# Patient Record
Sex: Female | Born: 1965 | Race: Black or African American | Hispanic: No | Smoking: Never smoker
Health system: Southern US, Community
[De-identification: ages and names within clinical notes are randomized; demographics above are authoritative.]

## PROBLEM LIST (undated history)

## (undated) DIAGNOSIS — N281 Cyst of kidney, acquired: Secondary | ICD-10-CM

## (undated) DIAGNOSIS — R42 Dizziness and giddiness: Secondary | ICD-10-CM

## (undated) DIAGNOSIS — T7840XA Allergy, unspecified, initial encounter: Secondary | ICD-10-CM

## (undated) DIAGNOSIS — I10 Essential (primary) hypertension: Secondary | ICD-10-CM

## (undated) DIAGNOSIS — D649 Anemia, unspecified: Secondary | ICD-10-CM

## (undated) DIAGNOSIS — K219 Gastro-esophageal reflux disease without esophagitis: Secondary | ICD-10-CM

## (undated) DIAGNOSIS — N19 Unspecified kidney failure: Secondary | ICD-10-CM

## (undated) DIAGNOSIS — E669 Obesity, unspecified: Secondary | ICD-10-CM

## (undated) DIAGNOSIS — A048 Other specified bacterial intestinal infections: Secondary | ICD-10-CM

## (undated) DIAGNOSIS — K635 Polyp of colon: Secondary | ICD-10-CM

## (undated) DIAGNOSIS — L723 Sebaceous cyst: Secondary | ICD-10-CM

## (undated) DIAGNOSIS — Q613 Polycystic kidney, unspecified: Secondary | ICD-10-CM

## (undated) DIAGNOSIS — D219 Benign neoplasm of connective and other soft tissue, unspecified: Secondary | ICD-10-CM

## (undated) DIAGNOSIS — F419 Anxiety disorder, unspecified: Secondary | ICD-10-CM

## (undated) DIAGNOSIS — G43909 Migraine, unspecified, not intractable, without status migrainosus: Secondary | ICD-10-CM

## (undated) HISTORY — DX: Anemia, unspecified: D64.9

## (undated) HISTORY — DX: Unspecified kidney failure: N19

## (undated) HISTORY — DX: Obesity, unspecified: E66.9

## (undated) HISTORY — DX: Anxiety disorder, unspecified: F41.9

## (undated) HISTORY — DX: Gastro-esophageal reflux disease without esophagitis: K21.9

## (undated) HISTORY — DX: Migraine, unspecified, not intractable, without status migrainosus: G43.909

## (undated) HISTORY — DX: Polyp of colon: K63.5

## (undated) HISTORY — DX: Sebaceous cyst: L72.3

## (undated) HISTORY — DX: Cyst of kidney, acquired: N28.1

## (undated) HISTORY — DX: Essential (primary) hypertension: I10

## (undated) HISTORY — DX: Benign neoplasm of connective and other soft tissue, unspecified: D21.9

## (undated) HISTORY — DX: Dizziness and giddiness: R42

## (undated) HISTORY — DX: Allergy, unspecified, initial encounter: T78.40XA

## (undated) HISTORY — DX: Polycystic kidney, unspecified: Q61.3

## (undated) HISTORY — PX: UTERINE FIBROID EMBOLIZATION: SHX825

## (undated) HISTORY — DX: Other specified bacterial intestinal infections: A04.8

---

## 1998-04-23 ENCOUNTER — Other Ambulatory Visit: Admission: RE | Admit: 1998-04-23 | Discharge: 1998-04-23 | Payer: Self-pay | Admitting: Gynecology

## 1999-02-17 ENCOUNTER — Other Ambulatory Visit: Admission: RE | Admit: 1999-02-17 | Discharge: 1999-02-17 | Payer: Self-pay | Admitting: Family Medicine

## 1999-10-06 ENCOUNTER — Encounter: Admission: RE | Admit: 1999-10-06 | Discharge: 2000-01-04 | Payer: Self-pay | Admitting: Internal Medicine

## 2000-01-06 ENCOUNTER — Other Ambulatory Visit: Admission: RE | Admit: 2000-01-06 | Discharge: 2000-01-06 | Payer: Self-pay | Admitting: *Deleted

## 2000-05-11 ENCOUNTER — Encounter: Payer: Self-pay | Admitting: Family Medicine

## 2000-05-11 ENCOUNTER — Ambulatory Visit (HOSPITAL_COMMUNITY): Admission: RE | Admit: 2000-05-11 | Discharge: 2000-05-11 | Payer: Self-pay | Admitting: Family Medicine

## 2001-12-29 ENCOUNTER — Encounter: Admission: RE | Admit: 2001-12-29 | Discharge: 2001-12-29 | Payer: Self-pay | Admitting: Family Medicine

## 2002-02-10 ENCOUNTER — Encounter: Admission: RE | Admit: 2002-02-10 | Discharge: 2002-02-10 | Payer: Self-pay | Admitting: Family Medicine

## 2002-02-15 ENCOUNTER — Encounter: Admission: RE | Admit: 2002-02-15 | Discharge: 2002-02-15 | Payer: Self-pay | Admitting: Family Medicine

## 2002-02-15 ENCOUNTER — Encounter: Payer: Self-pay | Admitting: Family Medicine

## 2002-03-17 ENCOUNTER — Encounter: Admission: RE | Admit: 2002-03-17 | Discharge: 2002-03-17 | Payer: Self-pay | Admitting: Family Medicine

## 2002-05-25 ENCOUNTER — Encounter: Admission: RE | Admit: 2002-05-25 | Discharge: 2002-05-25 | Payer: Self-pay | Admitting: Family Medicine

## 2002-12-28 ENCOUNTER — Encounter: Admission: RE | Admit: 2002-12-28 | Discharge: 2002-12-28 | Payer: Self-pay | Admitting: Family Medicine

## 2003-04-19 ENCOUNTER — Encounter: Admission: RE | Admit: 2003-04-19 | Discharge: 2003-04-19 | Payer: Self-pay | Admitting: Family Medicine

## 2003-04-23 ENCOUNTER — Encounter: Admission: RE | Admit: 2003-04-23 | Discharge: 2003-04-23 | Payer: Self-pay | Admitting: Family Medicine

## 2003-04-27 ENCOUNTER — Encounter: Admission: RE | Admit: 2003-04-27 | Discharge: 2003-04-27 | Payer: Self-pay | Admitting: Family Medicine

## 2003-07-26 ENCOUNTER — Encounter: Admission: RE | Admit: 2003-07-26 | Discharge: 2003-07-26 | Payer: Self-pay | Admitting: Family Medicine

## 2003-07-31 ENCOUNTER — Encounter: Admission: RE | Admit: 2003-07-31 | Discharge: 2003-07-31 | Payer: Self-pay | Admitting: Family Medicine

## 2003-08-28 ENCOUNTER — Encounter: Admission: RE | Admit: 2003-08-28 | Discharge: 2003-08-28 | Payer: Self-pay | Admitting: Sports Medicine

## 2004-06-17 ENCOUNTER — Encounter: Admission: RE | Admit: 2004-06-17 | Discharge: 2004-06-17 | Payer: Self-pay | Admitting: Internal Medicine

## 2004-08-07 ENCOUNTER — Encounter (INDEPENDENT_AMBULATORY_CARE_PROVIDER_SITE_OTHER): Payer: Self-pay | Admitting: *Deleted

## 2004-08-08 ENCOUNTER — Encounter: Payer: Self-pay | Admitting: Internal Medicine

## 2005-10-23 ENCOUNTER — Ambulatory Visit: Payer: Self-pay

## 2005-11-13 ENCOUNTER — Ambulatory Visit: Payer: Self-pay | Admitting: Family Medicine

## 2005-11-18 ENCOUNTER — Encounter (INDEPENDENT_AMBULATORY_CARE_PROVIDER_SITE_OTHER): Payer: Self-pay | Admitting: *Deleted

## 2005-11-20 ENCOUNTER — Encounter: Admission: RE | Admit: 2005-11-20 | Discharge: 2005-11-20 | Payer: Self-pay | Admitting: Sports Medicine

## 2005-12-14 HISTORY — PX: MYOMECTOMY: SHX85

## 2006-02-19 ENCOUNTER — Ambulatory Visit: Payer: Self-pay | Admitting: Family Medicine

## 2006-06-14 ENCOUNTER — Ambulatory Visit: Payer: Self-pay | Admitting: Family Medicine

## 2006-11-03 ENCOUNTER — Encounter: Admission: RE | Admit: 2006-11-03 | Discharge: 2006-11-03 | Payer: Self-pay | Admitting: Obstetrics and Gynecology

## 2006-11-06 ENCOUNTER — Encounter: Admission: RE | Admit: 2006-11-06 | Discharge: 2006-11-06 | Payer: Self-pay | Admitting: Interventional Radiology

## 2006-12-01 ENCOUNTER — Inpatient Hospital Stay (HOSPITAL_COMMUNITY): Admission: RE | Admit: 2006-12-01 | Discharge: 2006-12-03 | Payer: Self-pay | Admitting: Obstetrics and Gynecology

## 2006-12-01 ENCOUNTER — Encounter (INDEPENDENT_AMBULATORY_CARE_PROVIDER_SITE_OTHER): Payer: Self-pay | Admitting: *Deleted

## 2007-01-28 ENCOUNTER — Ambulatory Visit (HOSPITAL_BASED_OUTPATIENT_CLINIC_OR_DEPARTMENT_OTHER): Admission: RE | Admit: 2007-01-28 | Discharge: 2007-01-28 | Payer: Self-pay | Admitting: Cardiology

## 2007-02-10 DIAGNOSIS — I10 Essential (primary) hypertension: Secondary | ICD-10-CM

## 2007-02-10 DIAGNOSIS — N925 Other specified irregular menstruation: Secondary | ICD-10-CM | POA: Insufficient documentation

## 2007-02-10 DIAGNOSIS — N6029 Fibroadenosis of unspecified breast: Secondary | ICD-10-CM | POA: Insufficient documentation

## 2007-02-10 DIAGNOSIS — E669 Obesity, unspecified: Secondary | ICD-10-CM

## 2007-02-10 DIAGNOSIS — N949 Unspecified condition associated with female genital organs and menstrual cycle: Secondary | ICD-10-CM

## 2007-02-11 ENCOUNTER — Encounter (INDEPENDENT_AMBULATORY_CARE_PROVIDER_SITE_OTHER): Payer: Self-pay | Admitting: *Deleted

## 2007-03-29 ENCOUNTER — Encounter (HOSPITAL_COMMUNITY): Admission: RE | Admit: 2007-03-29 | Discharge: 2007-03-29 | Payer: Self-pay | Admitting: Cardiology

## 2007-10-05 ENCOUNTER — Encounter: Admission: RE | Admit: 2007-10-05 | Discharge: 2007-10-05 | Payer: Self-pay | Admitting: Cardiology

## 2007-10-05 ENCOUNTER — Encounter (INDEPENDENT_AMBULATORY_CARE_PROVIDER_SITE_OTHER): Payer: Self-pay | Admitting: *Deleted

## 2007-12-15 HISTORY — PX: LOBECTOMY: SHX5089

## 2008-01-14 ENCOUNTER — Encounter (INDEPENDENT_AMBULATORY_CARE_PROVIDER_SITE_OTHER): Payer: Self-pay | Admitting: *Deleted

## 2008-01-14 ENCOUNTER — Encounter: Admission: RE | Admit: 2008-01-14 | Discharge: 2008-01-14 | Payer: Self-pay | Admitting: Cardiology

## 2008-01-30 ENCOUNTER — Encounter: Admission: RE | Admit: 2008-01-30 | Discharge: 2008-01-30 | Payer: Self-pay | Admitting: Cardiology

## 2008-02-01 ENCOUNTER — Ambulatory Visit: Payer: Self-pay | Admitting: Thoracic Surgery

## 2008-02-06 ENCOUNTER — Ambulatory Visit (HOSPITAL_COMMUNITY): Admission: RE | Admit: 2008-02-06 | Discharge: 2008-02-06 | Payer: Self-pay | Admitting: Thoracic Surgery

## 2008-02-07 ENCOUNTER — Ambulatory Visit: Payer: Self-pay | Admitting: Thoracic Surgery

## 2008-03-16 ENCOUNTER — Inpatient Hospital Stay (HOSPITAL_COMMUNITY): Admission: RE | Admit: 2008-03-16 | Discharge: 2008-03-20 | Payer: Self-pay | Admitting: Thoracic Surgery

## 2008-03-16 ENCOUNTER — Encounter: Payer: Self-pay | Admitting: Thoracic Surgery

## 2008-03-20 ENCOUNTER — Ambulatory Visit: Payer: Self-pay | Admitting: Thoracic Surgery

## 2008-03-29 ENCOUNTER — Encounter: Admission: RE | Admit: 2008-03-29 | Discharge: 2008-03-29 | Payer: Self-pay | Admitting: Thoracic Surgery

## 2008-03-29 ENCOUNTER — Ambulatory Visit: Payer: Self-pay | Admitting: Thoracic Surgery

## 2008-04-20 ENCOUNTER — Ambulatory Visit: Payer: Self-pay | Admitting: Family Medicine

## 2008-04-20 DIAGNOSIS — F411 Generalized anxiety disorder: Secondary | ICD-10-CM | POA: Insufficient documentation

## 2008-04-20 DIAGNOSIS — F419 Anxiety disorder, unspecified: Secondary | ICD-10-CM | POA: Insufficient documentation

## 2008-04-20 DIAGNOSIS — I10 Essential (primary) hypertension: Secondary | ICD-10-CM | POA: Insufficient documentation

## 2008-04-25 ENCOUNTER — Encounter: Admission: RE | Admit: 2008-04-25 | Discharge: 2008-04-25 | Payer: Self-pay | Admitting: Thoracic Surgery

## 2008-04-25 ENCOUNTER — Ambulatory Visit: Payer: Self-pay | Admitting: Thoracic Surgery

## 2008-04-26 ENCOUNTER — Ambulatory Visit: Payer: Self-pay | Admitting: Licensed Clinical Social Worker

## 2008-05-11 ENCOUNTER — Ambulatory Visit: Payer: Self-pay | Admitting: Licensed Clinical Social Worker

## 2008-06-06 ENCOUNTER — Encounter: Admission: RE | Admit: 2008-06-06 | Discharge: 2008-06-06 | Payer: Self-pay | Admitting: Thoracic Surgery

## 2008-06-06 ENCOUNTER — Ambulatory Visit: Payer: Self-pay | Admitting: Thoracic Surgery

## 2008-11-24 ENCOUNTER — Telehealth: Payer: Self-pay | Admitting: Family Medicine

## 2008-11-26 ENCOUNTER — Ambulatory Visit: Payer: Self-pay | Admitting: Family Medicine

## 2008-11-26 LAB — CONVERTED CEMR LAB
ALT: 16 units/L (ref 0–35)
Basophils Absolute: 0.1 10*3/uL (ref 0.0–0.1)
Bilirubin, Direct: 0.1 mg/dL (ref 0.0–0.3)
CO2: 30 meq/L (ref 19–32)
Calcium: 8.7 mg/dL (ref 8.4–10.5)
Chloride: 105 meq/L (ref 96–112)
Cholesterol: 209 mg/dL (ref 0–200)
FSH: 4.1 milliintl units/mL
HDL: 41.8 mg/dL (ref >39.0)
Lymphocytes Relative: 44.9 % (ref 12.0–46.0)
MCHC: 34.2 g/dL (ref 30.0–36.0)
Neutrophils Relative %: 45.2 % (ref 43.0–77.0)
Platelets: 244 10*3/uL (ref 150–400)
Potassium: 3.5 meq/L (ref 3.5–5.1)
RDW: 13.3 % (ref 11.5–14.6)
Sodium: 141 meq/L (ref 135–145)
Specific Gravity, Urine: 1.02
Total Bilirubin: 0.7 mg/dL (ref 0.3–1.2)
Urobilinogen, UA: 0.2
VLDL: 13 mg/dL (ref 0–40)
WBC Urine, dipstick: NEGATIVE

## 2009-01-07 ENCOUNTER — Telehealth: Payer: Self-pay | Admitting: *Deleted

## 2009-03-04 ENCOUNTER — Ambulatory Visit: Payer: Self-pay | Admitting: Thoracic Surgery

## 2009-08-23 ENCOUNTER — Encounter: Payer: Self-pay | Admitting: Internal Medicine

## 2009-08-27 DIAGNOSIS — R109 Unspecified abdominal pain: Secondary | ICD-10-CM | POA: Insufficient documentation

## 2009-08-27 DIAGNOSIS — K222 Esophageal obstruction: Secondary | ICD-10-CM | POA: Insufficient documentation

## 2009-08-27 DIAGNOSIS — K219 Gastro-esophageal reflux disease without esophagitis: Secondary | ICD-10-CM | POA: Insufficient documentation

## 2009-08-28 ENCOUNTER — Ambulatory Visit: Payer: Self-pay | Admitting: Internal Medicine

## 2009-09-17 ENCOUNTER — Ambulatory Visit: Payer: Self-pay | Admitting: Internal Medicine

## 2009-09-17 ENCOUNTER — Encounter: Payer: Self-pay | Admitting: Internal Medicine

## 2009-10-02 ENCOUNTER — Encounter: Payer: Self-pay | Admitting: Internal Medicine

## 2009-10-04 ENCOUNTER — Telehealth: Payer: Self-pay | Admitting: Internal Medicine

## 2009-10-07 ENCOUNTER — Telehealth: Payer: Self-pay | Admitting: Internal Medicine

## 2009-10-09 ENCOUNTER — Ambulatory Visit: Payer: Self-pay | Admitting: Internal Medicine

## 2009-10-09 DIAGNOSIS — L980 Pyogenic granuloma: Secondary | ICD-10-CM | POA: Insufficient documentation

## 2009-10-09 DIAGNOSIS — K589 Irritable bowel syndrome without diarrhea: Secondary | ICD-10-CM | POA: Insufficient documentation

## 2009-10-18 ENCOUNTER — Encounter: Payer: Self-pay | Admitting: Internal Medicine

## 2009-10-18 ENCOUNTER — Ambulatory Visit: Payer: Self-pay | Admitting: Internal Medicine

## 2009-10-21 ENCOUNTER — Encounter: Payer: Self-pay | Admitting: Internal Medicine

## 2009-11-01 ENCOUNTER — Ambulatory Visit: Payer: Self-pay | Admitting: Pulmonary Disease

## 2009-11-01 DIAGNOSIS — R05 Cough: Secondary | ICD-10-CM

## 2009-11-01 DIAGNOSIS — R059 Cough, unspecified: Secondary | ICD-10-CM | POA: Insufficient documentation

## 2009-11-01 LAB — CONVERTED CEMR LAB: Angiotensin 1 Converting Enzyme: 43 units/L (ref 9–67)

## 2009-11-04 ENCOUNTER — Ambulatory Visit: Payer: Self-pay | Admitting: Internal Medicine

## 2009-11-05 ENCOUNTER — Ambulatory Visit: Payer: Self-pay | Admitting: Pulmonary Disease

## 2009-11-08 LAB — CONVERTED CEMR LAB
ALT: 16 units/L (ref 0–35)
AST: 17 units/L (ref 0–37)
Albumin: 3.8 g/dL (ref 3.5–5.2)
Basophils Relative: 0.5 % (ref 0.0–3.0)
Eosinophils Relative: 4.4 % (ref 0.0–5.0)
GFR calc non Af Amer: 87.02 mL/min (ref >60)
Glucose, Bld: 79 mg/dL (ref 70–99)
HCT: 32.6 % — ABNORMAL LOW (ref 36.0–46.0)
Hemoglobin: 11.2 g/dL — ABNORMAL LOW (ref 12.0–15.0)
Lymphs Abs: 2.2 10*3/uL (ref 0.7–4.0)
Monocytes Relative: 4.3 % (ref 3.0–12.0)
Neutro Abs: 2.7 10*3/uL (ref 1.4–7.7)
Potassium: 3.9 meq/L (ref 3.5–5.1)
RDW: 13.2 % (ref 11.5–14.6)
Sodium: 140 meq/L (ref 135–145)
Total Protein: 8 g/dL (ref 6.0–8.3)
WBC: 5.3 10*3/uL (ref 4.5–10.5)

## 2009-11-11 ENCOUNTER — Telehealth: Payer: Self-pay | Admitting: Pulmonary Disease

## 2009-11-11 ENCOUNTER — Encounter: Payer: Self-pay | Admitting: Pulmonary Disease

## 2009-11-14 ENCOUNTER — Telehealth: Payer: Self-pay | Admitting: Internal Medicine

## 2009-11-15 ENCOUNTER — Telehealth: Payer: Self-pay | Admitting: Internal Medicine

## 2009-12-17 ENCOUNTER — Telehealth (INDEPENDENT_AMBULATORY_CARE_PROVIDER_SITE_OTHER): Payer: Self-pay | Admitting: *Deleted

## 2010-03-24 ENCOUNTER — Ambulatory Visit: Payer: Self-pay | Admitting: Family Medicine

## 2010-03-24 DIAGNOSIS — Q638 Other specified congenital malformations of kidney: Secondary | ICD-10-CM | POA: Insufficient documentation

## 2010-03-24 DIAGNOSIS — N390 Urinary tract infection, site not specified: Secondary | ICD-10-CM | POA: Insufficient documentation

## 2010-03-24 DIAGNOSIS — N200 Calculus of kidney: Secondary | ICD-10-CM | POA: Insufficient documentation

## 2010-03-24 LAB — CONVERTED CEMR LAB
Nitrite: POSITIVE
WBC Urine, dipstick: NEGATIVE
pH: 6

## 2010-04-30 ENCOUNTER — Ambulatory Visit: Payer: Self-pay | Admitting: Family Medicine

## 2010-05-01 ENCOUNTER — Telehealth (INDEPENDENT_AMBULATORY_CARE_PROVIDER_SITE_OTHER): Payer: Self-pay | Admitting: *Deleted

## 2010-05-19 ENCOUNTER — Encounter: Payer: Self-pay | Admitting: Family Medicine

## 2010-09-23 ENCOUNTER — Telehealth (INDEPENDENT_AMBULATORY_CARE_PROVIDER_SITE_OTHER): Payer: Self-pay | Admitting: *Deleted

## 2010-12-18 ENCOUNTER — Ambulatory Visit
Admission: RE | Admit: 2010-12-18 | Discharge: 2010-12-18 | Payer: Self-pay | Source: Home / Self Care | Attending: Family Medicine | Admitting: Family Medicine

## 2010-12-18 ENCOUNTER — Other Ambulatory Visit: Payer: Self-pay | Admitting: Family Medicine

## 2010-12-18 ENCOUNTER — Encounter: Payer: Self-pay | Admitting: Family Medicine

## 2010-12-18 DIAGNOSIS — E559 Vitamin D deficiency, unspecified: Secondary | ICD-10-CM | POA: Insufficient documentation

## 2010-12-18 DIAGNOSIS — R079 Chest pain, unspecified: Secondary | ICD-10-CM | POA: Insufficient documentation

## 2010-12-18 DIAGNOSIS — E785 Hyperlipidemia, unspecified: Secondary | ICD-10-CM | POA: Insufficient documentation

## 2010-12-19 LAB — BASIC METABOLIC PANEL
BUN: 13 mg/dL (ref 6–23)
CO2: 28 mEq/L (ref 19–32)
Calcium: 8.6 mg/dL (ref 8.4–10.5)
Chloride: 103 mEq/L (ref 96–112)
Creatinine, Ser: 1 mg/dL (ref 0.4–1.2)
GFR: 76.67 mL/min (ref >60.00)
Glucose, Bld: 70 mg/dL (ref 70–99)
Potassium: 3.8 mEq/L (ref 3.5–5.1)
Sodium: 139 mEq/L (ref 135–145)

## 2010-12-19 LAB — CONVERTED CEMR LAB: Vit D, 25-Hydroxy: 31 ng/mL (ref 30–89)

## 2010-12-22 ENCOUNTER — Telehealth (INDEPENDENT_AMBULATORY_CARE_PROVIDER_SITE_OTHER): Payer: Self-pay | Admitting: *Deleted

## 2010-12-22 ENCOUNTER — Ambulatory Visit: Admit: 2010-12-22 | Payer: Self-pay | Admitting: Internal Medicine

## 2011-01-04 ENCOUNTER — Encounter: Payer: Self-pay | Admitting: Cardiology

## 2011-01-04 ENCOUNTER — Encounter: Payer: Self-pay | Admitting: Internal Medicine

## 2011-01-11 LAB — CONVERTED CEMR LAB
ALT: 18 units/L (ref 0–35)
Albumin: 3.8 g/dL (ref 3.5–5.2)
BUN: 13 mg/dL (ref 6–23)
Chloride: 106 meq/L (ref 96–112)
Cholesterol: 199 mg/dL (ref 0–200)
Eosinophils Relative: 7.4 % — ABNORMAL HIGH (ref 0.0–5.0)
Glucose, Bld: 84 mg/dL (ref 70–99)
HCT: 33.2 % — ABNORMAL LOW (ref 36.0–46.0)
Hemoglobin: 11.3 g/dL — ABNORMAL LOW (ref 12.0–15.0)
Lymphs Abs: 1.5 10*3/uL (ref 0.7–4.0)
MCV: 86.1 fL (ref 78.0–100.0)
Monocytes Absolute: 0.2 10*3/uL (ref 0.1–1.0)
Neutro Abs: 2.4 10*3/uL (ref 1.4–7.7)
Platelets: 266 10*3/uL (ref 150.0–400.0)
Potassium: 4.2 meq/L (ref 3.5–5.1)
RDW: 15.2 % — ABNORMAL HIGH (ref 11.5–14.6)
TSH: 2.13 microintl units/mL (ref 0.35–5.50)
Total Bilirubin: 0.3 mg/dL (ref 0.3–1.2)
Total Protein: 7.8 g/dL (ref 6.0–8.3)
Vit D, 25-Hydroxy: 27 ng/mL — ABNORMAL LOW (ref 30–89)
WBC: 4.5 10*3/uL (ref 4.5–10.5)

## 2011-01-15 NOTE — Progress Notes (Signed)
Summary: lab-lmomx2  Phone Note Outgoing Call   Call placed by: Doristine Devoid,  May 01, 2010 1:57 PM Call placed to: Patient Summary of Call: level slightly low- start 50,000 units Vit D x8 weeks and recheck level  Follow-up for Phone Call        left message on machine ..........Marland KitchenDoristine Devoid  May 01, 2010 1:57 PM   left message on machine ........Marland KitchenDoristine Devoid  May 14, 2010 9:35 AM   mailed labs and instructions....Marland KitchenMarland KitchenDoristine Devoid  May 20, 2010 1:16 PM     New/Updated Medications: VITAMIN D (ERGOCALCIFEROL) 50000 UNIT CAPS (ERGOCALCIFEROL) take one tablet weekly x8 weeks Prescriptions: VITAMIN D (ERGOCALCIFEROL) 50000 UNIT CAPS (ERGOCALCIFEROL) take one tablet weekly x8 weeks  #8 x 0   Entered by:   Doristine Devoid   Authorized by:   Neena Rhymes MD   Signed by:   Doristine Devoid on 05/20/2010   Method used:   Electronically to        [facility]  [address] [facility]* (retail)       [address]       [place], [place]  [address]       Ph: [phone] or [phone]       Fax: [phone]   RxID:   [id number]

## 2011-01-15 NOTE — Consult Note (Signed)
Summary: [facility]  [facility]   Imported By: Lanelle Bal 05/26/2010 13:49:07  _____________________________________________________________________  External Attachment:    Type:   Image     Comment:   External Document

## 2011-01-15 NOTE — Assessment & Plan Note (Signed)
Summary: FOLLOWUP/KN   Vital Signs:  Patient profile:   45 year old female Weight:      225 pounds BMI:     35.90 Pulse rate:   94 / minute BP sitting:   134 / 90  (left arm)  Vitals Entered By: Doristine Devoid CMA (December 18, 2010 2:07 PM) CC: bp f/u    History of Present Illness: 45 yo woman here today for  1) HTN- stopped HCTZ a few weeks ago.  will sometimes feel her heart beat- hasn't correlated this to any particular emotion or time of day.  says that when she feels the heart beat she feels 'a pressure' in her chest.  reports intermittant tension HAs, occasional 'light headed feeling' and SOB- mostly w/ exertion.  some edema of ankles- usually improves w/ elevation.  2) Vit D deficiency- never had f/u level done after completing 8 week course of meds.  3) Hyperlipidemia- was to work on diet and exercise for 6 months and then recheck labs.  never did.  has gained 21 lbs.    Current Medications (verified): 1)  Docusate Sodium 100 Mg Caps (Docusate Sodium) .... Take One Tablet Two Times A Day 2)  Calcium 600 Mg Tabs (Calcium) .... 1200mg  Daily 3)  Vitamin C 1000 Mg Tabs (Ascorbic Acid) .Marland Kitchen.. 1 By Mouth Daily 4)  Magnesium Drink .... Daily 5)  Proair Hfa 108 (90 Base) Mcg/act Aers (Albuterol Sulfate) .... Two Puffs Up To Four Times Per Day As Needed 6)  Omeprazole 40 Mg Cpdr (Omeprazole) .Marland Kitchen.. 1 Tab By Mouth Daily, May Increase To Two Times A Day As Needed 7)  Nasonex 50 Mcg/act Susp (Mometasone Furoate) .... 2 Sprays Each Nostril Once Daily 8)  Hydrochlorothiazide 12.5 Mg  Tabs (Hydrochlorothiazide) .... Take 1 Tab  By Mouth Every Morning 9)  Vitamin D 1000 Unit Tabs (Cholecalciferol) .Marland Kitchen.. 1 Tab Daily 10)  Vitamin B-12 100 Mcg Tabs (Cyanocobalamin) .Marland Kitchen.. 1 Tab Daily 11)  Magnesium 500 Mg Tabs (Magnesium) .Marland Kitchen.. 1 Tab Daily 12)  Multivitamins  Tabs (Multiple Vitamin) .Marland Kitchen.. 1 Daily  Allergies (verified): 1)  ! Biaxin  Past History:  Past medical, surgical, family and social  histories (including risk factors) reviewed for relevance to current acute and chronic problems.  Past Medical History: Reviewed history from 11/01/2009 and no changes required. 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 TV US- multiple fibroids, largest 6 cm - 12/03/2005, u/s breast - benign - 02/23/2002 myomectomy 2007 Right VATS 2009 Dr. Edwyna Shell  Family History: Reviewed history from 03/24/2010 and no changes required. mother died age 61 cva, htn, cad, dm;  father died in 79s uncertain; has 5 brothers and four sisters with many having htn, cad and dm No FH of Colon Cancer: Family History of Diabetes: Mom, Sister, Brother Family History of Heart Disease: Sister, Uncle Family History of Kidney Disease: Sister, Mother (stones)  Social History: Reviewed history from 10/09/2009 and no changes required. single; has 41 year old son; unemployed; lives with boyfriend of three years; likes tv, internet, movies; no tobacco no drugs [employer], political party Married Never Smoked Alcohol use-no Drug use-no Regular exercise-no  Review of Systems      See HPI  Physical Exam  General:  Well-developed,well-nourished,in no acute distress; alert,appropriate  and cooperative throughout examination.  overwt Head:  Normocephalic and atraumatic without obvious abnormalities. No apparent alopecia or balding. Eyes:  PERRL, EOMI Neck:  No deformities, masses, or tenderness noted. Lungs:  Normal respiratory effort, chest expands symmetrically. Lungs are clear to auscultation, no crackles or wheezes. Heart:  Normal rate and regular rhythm. S1 and S2 normal without gallop, murmur, click, rub or other extra sounds. Abdomen:   Bowel sounds positive,abdomen soft and non-tender without masses, organomegaly or hernias noted. Pulses:  +2 carotid, radial, DP Extremities:  No clubbing, cyanosis, edema, or deformity noted  Neurologic:  alert & oriented X3, cranial nerves II-XII intact, and gait normal.   Cervical Nodes:  No lymphadenopathy noted Psych:  anxious appearing   Impression & Recommendations:  Problem # 1:  HYPERTENSION (ICD-401.9) Assessment Unchanged given systolic BP of 90 and pt's reported intermittant edema advised her to restart HCTZ.  check labs. The following medications were removed from the medication list:    Hydrochlorothiazide 25 Mg Tabs (Hydrochlorothiazide) .Marland Kitchen... Take 1/4 tablet every other day Her updated medication list for this problem includes:    Hydrochlorothiazide 12.5 Mg Tabs (Hydrochlorothiazide) .Marland Kitchen... Take 1 tab  by mouth every morning  Orders: Specimen Handling ([address]) TLB-BMP (Basic Metabolic Panel-BMET) (80048-METABOL)  Problem # 2:  VITAMIN D DEFICIENCY (ICD-268.9) Assessment: New recheck labs to ensure adequate repletion. Orders: Venipuncture (98119) T-Vitamin D (25-Hydroxy) (14782-95621) Specimen Handling (308[address])  Problem # 3:  CHEST PAIN (ICD-786.50) Assessment: New given pt's reported chest pressure, SOB w/ exertion, likely elevated cholesterol attempted to do EKG but pt declined.  prefers to see cards for this.  as pt is asymptomatic today will put in referral but reviewed red flags that should prompt immediate attention.  Pt expresses understanding and is in agreement w/ this plan. Orders: Cardiology Referral (Cardiology)  Problem # 4:  HYPERLIPIDEMIA (ICD-272.4) Assessment: New pt did not f/u for repeat labs nor work on diet or exercise as she was supposed to.  pt not fasting today so unable to get accurate labs.  pt will return for these.  Complete Medication List: 1)  Docusate Sodium 100 Mg Caps (Docusate sodium) .... Take one tablet two times a day 2)   Calcium 600 Mg Tabs (Calcium) .... 1200mg  daily 3)  Vitamin C 1000 Mg Tabs (Ascorbic acid) .Marland Kitchen.. 1 by mouth daily 4)  Magnesium Drink  .... Daily 5)  Proair Hfa 108 (90 Base) Mcg/act Aers (Albuterol sulfate) .... Two puffs up to four times per day as needed 6)  Omeprazole 40 Mg Cpdr (Omeprazole) .Marland Kitchen.. 1 tab by mouth daily, may increase to two times a day as needed 7)  Nasonex 50 Mcg/act Susp (Mometasone furoate) .... 2 sprays each nostril once daily 8)  Hydrochlorothiazide 12.5 Mg Tabs (Hydrochlorothiazide) .... Take 1 tab  by mouth every morning 9)  Vitamin D 1000 Unit Tabs (Cholecalciferol) .Marland Kitchen.. 1 tab daily 10)  Vitamin B-12 100 Mcg Tabs (Cyanocobalamin) .Marland Kitchen.. 1 tab daily 11)  Magnesium 500 Mg Tabs (Magnesium) .Marland Kitchen.. 1 tab daily 12)  Multivitamins Tabs (Multiple vitamin) .Marland Kitchen.. 1 daily  Patient Instructions: 1)  Please schedule a follow-up appointment in 1 month to recheck blood pressure and potassium. 2)  Someone will call you with your cardiology appt 3)  Take the hydrochlorothiazide daily 4)  Call with any questions or concerns 5)  Hang in there!  Prescriptions: MULTIVITAMINS  TABS (MULTIPLE VITAMIN) 1 daily  #1 bottle x 3   Entered and Authorized by:   Neena Rhymes MD  Signed by:   Neena Rhymes MD on 12/18/2010   Method used:   Electronically to        [facility]* (retail)       [address]       [place], [place]  [address]       Ph: [phone] or [phone]       Fax: [phone]   RxID:   [id number] MAGNESIUM 500 MG TABS (MAGNESIUM) 1 tab daily  #30 x 3   Entered and Authorized by:   Neena Rhymes MD   Signed by:   Neena Rhymes MD on 12/18/2010   Method used:   Electronically to        [facility]  [address] [facility]* (retail)       [address]       [place], [place]  [address]       Ph: [phone] or [phone]       Fax: [phone]   RxID:   [id number] VITAMIN B-12 100 MCG TABS  (CYANOCOBALAMIN) 1 tab daily  #30 x 3   Entered and Authorized by:   Neena Rhymes MD   Signed by:   Neena Rhymes MD on 12/18/2010   Method used:   Electronically to        [facility]  [address] [facility]* (retail)       [address]       [place], [place]  [address]       Ph: [phone] or [phone]       Fax: [phone]   RxID:   [id number] VITAMIN D 1000 UNIT TABS (CHOLECALCIFEROL) 1 tab daily  #30 x 3   Entered and Authorized by:   Neena Rhymes MD   Signed by:   Neena Rhymes MD on 12/18/2010   Method used:   Electronically to        [facility]  [address] [facility]* (retail)       [address]       [place], [place]  [address]       Ph: [phone] or [phone]       Fax: [phone]   RxID:   [id number] HYDROCHLOROTHIAZIDE 12.5 MG  TABS (HYDROCHLOROTHIAZIDE) Take 1 tab  by mouth every morning  #30 x 3   Entered and Authorized by:   Neena Rhymes MD   Signed by:   Neena Rhymes MD on 12/18/2010   Method used:   Electronically to        [facility]  [address] [facility]* (retail)       [address]       [place], [place]  [address]       Ph: [phone] or [phone]       Fax: [phone]   RxID:   [id number] CALCIUM 600 MG TABS (CALCIUM) 1200mg  daily  #60 x 3   Entered and Authorized by:   Neena Rhymes MD   Signed by:   Neena Rhymes MD on 12/18/2010   Method used:   Electronically to        [facility]  [address] [facility]* (retail)       [address]       [facility]  [place], [place]  [address]       Ph: [phone] or [phone]       Fax: [phone]   RxID:   [id number] MIRALAX   POWD (POLYETHYLENE GLYCOL 3350) Take 9 - 17 gm three times a week  #527 gm x 3   Entered and Authorized by:   Neena Rhymes MD   Signed by:   Neena Rhymes MD on 12/18/2010   Method used:   Electronically to        [facility]  [address] [facility]* (retail)        [address]       [place], [place]  [address]       Ph: [phone] or [phone]       Fax: [phone]   RxID:   [id number]    Orders Added: 1)  Venipuncture [364[address]] 2)  T-Vitamin D (25-Hydroxy) [phone] 3)  Specimen Handling [[address]] 4)  Cardiology Referral [Cardiology] 5)  TLB-BMP (Basic Metabolic Panel-BMET) [80048-METABOL] 6)  Est. Patient Level IV [[address]]

## 2011-01-15 NOTE — Assessment & Plan Note (Signed)
Summary: cpx/kdc   Vital Signs:  Patient profile:   45 year old female Height:      66.5 inches Weight:      204 pounds Pulse rate:   64 / minute BP sitting:   140 / 90  Vitals Entered By: Heidi Herrera (Apr 30, 2010 8:07 AM) CC: cpx, no pap   History of Present Illness: 45 yo woman here today for CPE.  GYN- Dr Heidi Herrera.  Overdue on pap and mammo.  Plans on calling to schedule.  URI- had sxs 2-3 weeks ago, resolved.  now sxs are returning- now complains of a deep cough.  no fevers.  non productive.  no nasal congestion.  no known sick contacts.  no hx of asthma.  Rescheduled appt w/ urology to June 6th.  Preventive Screening-Counseling & Management  Alcohol-Tobacco     Alcohol drinks/day: <1     Smoking Status: never  Caffeine-Diet-Exercise     Does Patient Exercise: no      Drug Use:  never.    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 Take 1/4 Tablet Every Other Day 2)  Omeprazole 20 Mg Cpdr (Omeprazole) .... Two Times A Day 3)  Miralax   Powd (Polyethylene Glycol 3350) .... Take 9 - 17 Gm Three Times A Week 4)  Calcium 600 Mg Tabs (Calcium) .... 1200mg  Daily 5)  Vitamin C 1000 Mg Tabs (Ascorbic Acid) .Marland Kitchen.. 1 By Mouth Daily 6)  Magnesium Drink .... Daily 7)  Proair Hfa 108 (90  Base) Mcg/act Aers (Albuterol Sulfate) .... Two Puffs Up To Four Times Per Day As Needed  Allergies (verified): 1)  ! 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 Edwyna Herrera  Family History: Last updated: 04-23-10 mother died age 58 cva, htn, cad, dm;  father died in 68s uncertain; has 5 brothers and four sisters with many having htn, cad and dm No FH of Colon Cancer: Family History of Diabetes: Mom, Sister, Brother Family History of Heart Disease: Sister, Uncle Family History of Kidney Disease: Sister, Mother (stones)  Review of Systems       The patient complains of prolonged cough, severe indigestion/heartburn, and suspicious skin lesions.  The patient denies anorexia, fever, weight loss, weight gain, vision loss, decreased hearing, hoarseness, chest pain, syncope, dyspnea on exertion, peripheral edema, headaches, abdominal pain, melena, hematochezia, hematuria, depression, abnormal bleeding, enlarged lymph nodes, and breast masses.    Physical Exam  General:  Well-developed,well-nourished,in no  acute distress; alert,appropriate and cooperative throughout examination.  overwt Head:  Normocephalic and atraumatic without obvious abnormalities. No apparent alopecia or balding. Eyes:  No corneal or conjunctival inflammation noted. EOMI. Perrla. Funduscopic exam benign, without hemorrhages, exudates or papilledema. Vision grossly normal. Ears:  External ear exam shows no significant lesions or deformities.  Otoscopic examination reveals clear canals, tympanic  membranes are intact bilaterally without bulging, retraction, inflammation or discharge. Hearing is grossly normal bilaterally. Nose:  edematous turbinates Mouth:  + PND, otherwise normal Neck:  No deformities, masses, or tenderness noted. Breasts:  deferred to GYN Lungs:  Normal respiratory effort, chest expands symmetrically. Lungs are clear to auscultation, no crackles or wheezes.  no cough during visit Heart:  Normal rate and regular rhythm. S1 and S2 normal without gallop, murmur, click, rub or other extra sounds. Abdomen:  Bowel sounds positive,abdomen soft and non-tender without masses, organomegaly or hernias noted. Genitalia:  deferred to gyn Msk:  No deformity or scoliosis noted of thoracic or lumbar spine.   Pulses:  +2 carotid, radial, DP Extremities:  No clubbing, cyanosis, edema, or deformity noted with normal full range of motion of all joints.   Neurologic:  No cranial nerve deficits noted. Station and gait are normal. Plantar reflexes are down-going bilaterally. DTRs are symmetrical throughout. Sensory, motor and coordinative functions appear intact. Skin:  Intact without suspicious lesions or rashes, + Keloids on R flank (this is what pt found suspicious) Cervical Nodes:  No lymphadenopathy noted Axillary Nodes:  No palpable lymphadenopathy Psych:  Cognition and judgment appear intact. Alert and cooperative with normal attention span and concentration. No apparent delusions, illusions, hallucinations   Impression & Recommendations:  Problem # 1:  PHYSICAL EXAMINATION (ICD-V70.0) Assessment New pt's PE WNL w/ exception of obesity.  check labs as below.  encouraged pt to f/u w/ GYN for additional health maintainence. Orders: Venipuncture ([address]) TLB-Lipid Panel (80061-LIPID) TLB-BMP (Basic Metabolic Panel-BMET) (80048-METABOL) TLB-CBC Platelet - w/Differential (85025-CBCD) TLB-Hepatic/Liver Function Pnl (80076-HEPATIC) TLB-TSH (Thyroid Stimulating Hormone)  (84443-TSH) T-Vitamin D (25-Hydroxy) ([address])  Problem # 2:  OBESITY, NOS (ICD-278.00) Assessment: Unchanged will refer to nutrition at pt's request.  encouraged increased and regular exercise. Orders: Nutrition Referral (Nutrition)  Problem # 3:  COUGH (ICD-786.2) Assessment: Unchanged lung exam WNL.  no cough during appt.  given edematous turbinates and pt's report that sxs are worse at night may be component of PND, GERD, asthma.  sees Dr Craige Cotta (pulm).  start nasal steroid spray to decrease PND, increase Omeprazole, and tessalon as needed- if no improvement pt to f/u w/ pulm.  Problem # 4:  GERD (ICD-530.81) Assessment: Deteriorated pt's sxs not controlled on 20mg .  increase to 40 mg.  reviewed lifestyle modifications. The following medications were removed from the medication list:    Omeprazole 20 Mg Cpdr (Omeprazole) .Marland Kitchen..Marland Kitchen Two times a day Her updated medication list for this problem includes:    Omeprazole 40 Mg Cpdr (Omeprazole) .Marland Kitchen... 1 tab by mouth daily, may increase to two times a day as needed  Problem # 5:  HYPERTENSION (ICD-401.9) Assessment: Unchanged BP is elevated today.  pt has been taking OTC cough and cold products.  will have her return for nurse visit to recheck BP- adjust meds as needed. Her updated medication list for this problem includes:    Hydrochlorothiazide 25 Mg Tabs (Hydrochlorothiazide) .Marland Kitchen... Take 1/4 tablet every other day  Complete Medication List: 1)  Hydrochlorothiazide 25 Mg Tabs (Hydrochlorothiazide) .... Take 1/4 tablet every other day 2)  Miralax Powd (  Polyethylene glycol 3350) .... Take 9 - 17 gm three times a week 3)  Calcium 600 Mg Tabs (Calcium) .... 1200mg  daily 4)  Vitamin C 1000 Mg Tabs (Ascorbic acid) .Marland Kitchen.. 1 by mouth daily 5)  Magnesium Drink  .... Daily 6)  Proair Hfa 108 (90 Base) Mcg/act Aers (Albuterol sulfate) .... Two puffs up to four times per day as needed 7)  Omeprazole 40 Mg Cpdr (Omeprazole) .Marland Kitchen.. 1 tab by mouth daily,  may increase to two times a day as needed 8)  Nasonex 50 Mcg/act Susp (Mometasone furoate) .... 2 sprays each nostril once daily 9)  Tessalon 200 Mg Caps (Benzonatate) .... Take one capsule by mouth three times a day as needed for cough  Patient Instructions: 1)  Please schedule a nurse visit in 3-4 weeks to recheck BP 2)  We'll notify you of your lab results 3)  Please schedule your GYN appt to get your pap and mammogram 4)  Use the nasal spray as directed to decrease post nasal drip and in turn, your cough 5)  Use the Tessalon pills for cough 6)  Mucinex (over the counter) to thin your chest congestion 7)  Someone will call you with your nutrition appt 8)  Try and get regular exercise- this will help with your weight loss 9)  Take the new strength of Omeprazole to help with your reflux 10)  Call with any questions or concerns 11)  Hang in there! Prescriptions: TESSALON 200 MG CAPS (BENZONATATE) Take one capsule by mouth three times a day as needed for cough  #60 x 0   Entered and Authorized by:   Neena Rhymes MD   Signed by:   Neena Rhymes MD on 04/30/2010   Method used:   Electronically to        [facility]  [address] [facility]* (retail)       [address]       [place], [place]  [address]       Ph: [phone] or [phone]       Fax: [phone]   RxID:   [id number] NASONEX 50 MCG/ACT SUSP (MOMETASONE FUROATE) 2 sprays each nostril once daily  #1 x 3   Entered and Authorized by:   Neena Rhymes MD   Signed by:   Neena Rhymes MD on 04/30/2010   Method used:   Electronically to        [facility]  [address] [facility]* (retail)       [address]       [place], [place]  [address]       Ph: [phone] or [phone]       Fax: [phone]   RxID:   [id number] OMEPRAZOLE 40 MG CPDR (OMEPRAZOLE) 1 tab by mouth daily, may increase to two times a day as needed  #60 x 3   Entered and Authorized by:   Neena Rhymes MD   Signed by:   Neena Rhymes MD on 04/30/2010   Method used:   Electronically to        [facility]  [address] [facility] (retail)       [address]       [place], [place]  [address]       Ph: [phone] or [phone]       Fax: [phone]   RxID:  [id number]   Appended Document: cpx/kdc     Clinical Lists Changes  Orders: Added new Service order of EKG w/ Interpretation ([address]) - Signed

## 2011-01-15 NOTE — Progress Notes (Signed)
Summary: change bp med  Phone Note Refill Request Call back at [facility] Phone [phone]   Refills Requested: Medication #1:  HYDROCHLOROTHIAZIDE 25 MG TABS Take 1/4 tablet every other day Caller: Patient Summary of Call: Pt left VM that she would like a refill of her BP med.. Pt did not leave any more details but stated that she would like a call back..........Marland KitchenFelecia Deloach CMA  September 23, 2010 3:44 PM   Follow-up for Phone Call        spoke w/ patient says that her bp is still elevated on the hctz has been 138/98 and wanted to know if she could get prescription for lisinopril informed patient that office visit was needed first before calling in anything says she will be unable to scheduled appt until end of month.....Marland KitchenMarland KitchenDoristine Devoid CMA  September 23, 2010 4:59 PM   Additional Follow-up for Phone Call Additional follow up Details #1::        pt was supposed to have a nurse visit to recheck BP in June and never followed up.  agree that reported BP is high, but not dangerously high- if she is checking this at home she needs to bring her cuff to her next visit to correlate the readings.  will not start additional meds until pt seen in office. Additional Follow-up by: Neena Rhymes MD,  September 24, 2010 8:00 AM    Additional Follow-up for Phone Call Additional follow up Details #2::    left message on machine ............Marland KitchenDoristine Devoid CMA  September 24, 2010 1:21 PM   pt returning call left message to call office................Marland KitchenFelecia Deloach CMA  September 25, 2010 2:39 PM   Discuss with patient, pt verbalized understanding, offer to schedule appt pt decline.........Marland KitchenFelecia Deloach CMA  September 25, 2010 2:47 PM

## 2011-01-15 NOTE — Progress Notes (Signed)
Summary: Needs OV with Dr. Craige Cotta- pt returned call  ---- Converted from flag ---- ---- 11/18/2009 9:27 AM, Michel Bickers CMA wrote: Pt needs a f/u appt in January, but the pt says she will call back to schedule. She needs something after the 17th and everything I have offered her she cannot do. ------------------------------  Phone Note Outgoing Call   Call placed by: Michel Bickers CMA,  December 17, 2009 4:56 PM Call placed to: Patient Summary of Call: LMTCB.   LMTCB.Michel Bickers [facility]  December 19, 2009 9:35 AM Initial call taken by: Michel Bickers CMA,  December 17, 2009 4:56 PM  Follow-up for Phone Call        lori. pt isn't sure why she needed an appt. she sees dr Craige Cotta. didn't know what you wanted her to come in for. call home #. Tivis Ringer  December 25, 2009 3:44 PM  Dr. Craige Cotta wants to see this patient back for f/u in January 2011. LMTCB. Follow-up by: Michel Bickers CMA,  December 26, 2009 2:13 PM  Additional Follow-up for Phone Call Additional follow up Details #1::        pt stated that she can't come back in January and that she would have to call when she could come back in. Additional Follow-up by: Eugene Gavia,  December 27, 2009 9:22 AM

## 2011-01-15 NOTE — Assessment & Plan Note (Signed)
Summary: new to est//UTI??//lch   Vital Signs:  Patient profile:   45 year old female Weight:      196 pounds Temp:     99.5 degrees F oral BP sitting:   122 / 74  (left arm)  Vitals Entered By: Doristine Devoid (March 24, 2010 3:05 PM) CC: NEW EST- dysuria xsat. along w/ fever and chills    History of Present Illness: 45 yo woman here today to establish w/ me.  pt reports recent hx of abdominal pain.  had w/u w/ GI and it was (-).  pain is in L flank- often in the AM, sharp.  reports this has been going on for 'years but no one can tell me why'.  has never seen urologist for horseshoe kidney but knows she has stones from pulm xray.  dysuria started on Wed.  + frequency, hesitancy.  no urgency.  no hematuria.  + L flank pain.  hx of UTIs.  Preventive Screening-Counseling & Management  Alcohol-Tobacco     Smoking Status: never      Sexual History:  currently monogamous.        Drug Use:  never.    Current Medications (verified): 1)  Hydrochlorothiazide 25 Mg Tabs (Hydrochlorothiazide) .... Take 1/4 Tablet Every Other Day 2)  Omeprazole 20 Mg Cpdr (Omeprazole) .... Two Times A Day 3)  Vitamin D3 50000iu .... Take Once Weekly 4)  Miralax   Powd (Polyethylene Glycol 3350) .... Take 9 - 17 Gm Three Times A Week 5)  Calcium 600 Mg Tabs (Calcium) .... 1200mg  Daily 6)  Vitamin C 1000 Mg Tabs (Ascorbic Acid) .Marland Kitchen.. 1 By Mouth Daily 7)  Magnesium Drink .... Daily 8)  Proair Hfa 108 (90 Base) Mcg/act Aers (Albuterol Sulfate) .... Two Puffs Up To Four Times Per Day As Needed 9)  Ciprofloxacin Hcl 500 Mg Tabs (Ciprofloxacin Hcl) 10)  Ciprofloxacin Hcl 500 Mg Tabs (Ciprofloxacin Hcl) .Marland Kitchen.. 1 Tab By Mouth Two Times A Day X 10 Days  Allergies (verified): 1)  ! Biaxin  Past History:  Past Medical History: Reviewed history from 11/01/2009 and no changes required. 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 Edwyna Shell  Past History:  Care Management: Pulmonary: Sood Gastroenterology: Juanda Chance Gynecology: [facility]  Family History: mother died age 3 cva, htn, cad, dm;  father died in 66s uncertain; has 5 brothers and four sisters with many having htn, cad and dm No FH of Colon Cancer: Family History of Diabetes: Mom, Sister, Brother Family History of Heart Disease: Sister, Education officer, community Family History of Kidney Disease: Sister, Mother (stones)  Social History: Sexual History:  currently monogamous Drug Use:  never  Review of Systems      See HPI  Physical Exam  General:  NAD Abdomen:  soft, NT/ND, +BS.  mild suprapubic tenderness, no CVA tenderness   Impression & Recommendations:  Problem # 1:  UTI (ICD-599.0) Assessment New pt's sxs and UA suspicious for infxn.  start Cipro for 10 days given concern for pyelo (fevers, chills, flank pain) and fact pt has horseshoe kidney.  reviewed supportive care and red flags that should prompt return.  Pt expresses understanding and is in agreement w/ this  plan. Her updated medication list for this problem includes:    Ciprofloxacin Hcl 500 Mg Tabs (Ciprofloxacin hcl)    Ciprofloxacin Hcl 500 Mg Tabs (Ciprofloxacin hcl) .Marland Kitchen... 1 tab by mouth two times a day x 10 days  Orders: UA Dipstick w/o Micro (manual) ([address]) Specimen Handling (99000) T-Culture, Urine ([id number])  Problem # 2:  HORSESHOE KIDNEY (ICD-753.3) Assessment: New pt has never seen urology despite presence of stones in horseshoe kidney.  will refer. Orders: Urology Referral (Urology)  Problem # 3:  NEPHROLITHIASIS (ICD-592.0)  Orders: Urology Referral (Urology)  Problem # 4:  ABDOMINAL PAIN, UNSPECIFIED SITE  (ICD-789.00)  Complete Medication List: 1)  Hydrochlorothiazide 25 Mg Tabs (Hydrochlorothiazide) .... Take 1/4 tablet every other day 2)  Omeprazole 20 Mg Cpdr (Omeprazole) .... Two times a day 3)  Vitamin D3 50000iu  .... Take once weekly 4)  Miralax Powd (Polyethylene glycol 3350) .... Take 9 - 17 gm three times a week 5)  Calcium 600 Mg Tabs (Calcium) .... 1200mg  daily 6)  Vitamin C 1000 Mg Tabs (Ascorbic acid) .Marland Kitchen.. 1 by mouth daily 7)  Magnesium Drink  .... Daily 8)  Proair Hfa 108 (90 Base) Mcg/act Aers (Albuterol sulfate) .... Two puffs up to four times per day as needed 9)  Ciprofloxacin Hcl 500 Mg Tabs (Ciprofloxacin hcl) 10)  Ciprofloxacin Hcl 500 Mg Tabs (Ciprofloxacin hcl) .Marland Kitchen.. 1 tab by mouth two times a day x 10 days  Patient Instructions: 1)  Please schedule a physical at your convenience sometime in the next month.  Do not eat before this appt 2)  Start the Cipro tonight at dinner 3)  Drink plenty of fluids 4)  You can get OTC AZO for the burning 5)  If you develop worsening fevers, nausea/vomiting, or other concerns- please call or go to the ER 6)  Someone will call you with your urology appt 7)  Hang in there!! Prescriptions: CIPROFLOXACIN HCL 500 MG TABS (CIPROFLOXACIN HCL) 1 tab by mouth two times a day x 10 days  #20 x 0   Entered and Authorized by:   Neena Rhymes MD   Signed by:   Neena Rhymes MD on 03/24/2010   Method used:   Electronically to        [facility]  [address] # [id number]* (retail)       [address]       [place], [place]  [address]       Ph: [phone] or [phone]       Fax: [phone]   RxID:   [id number]   Laboratory Results   Urine Tests    Routine Urinalysis   Glucose: negative   (Normal Range: Negative) Bilirubin: negative   (Normal Range: Negative) Ketone: negative   (Normal Range: Negative) Spec. Gravity: 1.015   (Normal Range: 1.003-1.035) Blood: large   (Normal Range: Negative) pH: 6.0    (Normal Range: 5.0-8.0) Protein: negative   (Normal Range: Negative) Urobilinogen: 0.2   (Normal Range: 0-1) Nitrite: positive   (Normal Range: Negative) Leukocyte Esterace: negative   (Normal Range: Negative)

## 2011-01-15 NOTE — Progress Notes (Signed)
Summary: RX Change  Phone Note From Pharmacy   Caller: [facility]  [address] # [id number]* Summary of Call: In refrence to rx for Polethylene Glycol 3350 Powd  Patient has requested a change to a stool softner rather than a laxative. Please consider change to Docusate Sodium 100mg  1 cap by mouth two times a day #60. Thanks.  Initial call taken by: Harold Barban,  December 22, 2010 8:54 AM    New/Updated Medications: DOCUSATE SODIUM 100 MG CAPS (DOCUSATE SODIUM) take one tablet two times a day Prescriptions: DOCUSATE SODIUM 100 MG CAPS (DOCUSATE SODIUM) take one tablet two times a day  #60 x 3   Entered by:   Doristine Devoid CMA   Authorized by:   Neena Rhymes MD   Signed by:   Doristine Devoid CMA on 12/23/2010   Method used:   Electronically to        [facility]  [address] # [id number]* (retail)       [address]       [place], [place]  [address]       Ph: [phone] or [phone]       Fax: [phone]   RxID:   [id number]

## 2011-01-15 NOTE — Letter (Signed)
Summary: Letter Regarding Disease Mgmt Program/[facility]  Letter Regarding Disease Mgmt Program/[facility]   Imported By: Lanelle Bal 12/26/2010 08:16:55  _____________________________________________________________________  External Attachment:    Type:   Image     Comment:   External Document

## 2011-02-04 ENCOUNTER — Telehealth (INDEPENDENT_AMBULATORY_CARE_PROVIDER_SITE_OTHER): Payer: Self-pay | Admitting: *Deleted

## 2011-02-05 ENCOUNTER — Telehealth (INDEPENDENT_AMBULATORY_CARE_PROVIDER_SITE_OTHER): Payer: Self-pay | Admitting: *Deleted

## 2011-02-10 NOTE — Progress Notes (Signed)
Summary: -med concerns  Phone Note Refill Request Call back at Home Phone [phone]   Refills Requested: Medication #1:  HYDROCHLOROTHIAZIDE 12.5 MG  TABS Take 1 tab  by mouth every morning Pt states that med is making her urinate frequently and is causing hair loss. Pt would like to be change to another BP med. Pt also would like to go back on Rx for vitamin D due to having frequent pain in ankles mainly in am that she feels is related to vitamin d deficiency.Marland KitchenPt aware med is diuretic and frequent urination is normal...Marland KitchenMarland KitchenFelecia Deloach CMA  February 04, 2011 3:56 PM   [facility] [address].Marland KitchenMarland KitchenFelecia Deloach CMA  February 04, 2011 3:46 PM    Follow-up for Phone Call        prescription Vit D isn't indicated b/c level is normal- should continue daily supplement to avoid dropping low.  can switch from HCTZ to Lisinopril 10mg  daily.  will need OV in 3-4 weeks to check BP and labs Follow-up by: Neena Rhymes MD,  February 04, 2011 4:02 PM  Additional Follow-up for Phone Call Additional follow up Details #1::        Pt would like to know if lisinopril will cause hair to fall out and if pain in ankle is related to Vitamin d deficiency, Pls advise.......Marland KitchenFelecia Deloach CMA  February 04, 2011 5:09 PM   Discuss with patient,.Felecia Deloach CMA  February 04, 2011 5:11 PM     Additional Follow-up for Phone Call Additional follow up Details #2::    pain in ankles is not related to vitamin D b/c she is not Vitamin D deficient.  i have never heard of ankle pain being related to Vitamin D  as far as the lisinopril causing hair loss- i've never had someone complain that HCTZ caused hair loss so i can't be certain.  it would be highly unlikely that either medicine is causing hair loss.  increased urination w/ HCTZ- yes.  hair loss- no.  she should be fine on lisinopril.  Additional Follow-up for Phone Call Additional follow up Details #3:: Details for Additional Follow-up Action Taken:  left message to call office ....Marland KitchenMarland KitchenFelecia Deloach CMA  February 05, 2011 8:19 AM   Discuss with patient...........Marland KitchenFelecia Deloach CMA  February 05, 2011 2:35 PM   New/Updated Medications: LISINOPRIL 10 MG TABS (LISINOPRIL) Take 1 tab once daily Prescriptions: LISINOPRIL 10 MG TABS (LISINOPRIL) Take 1 tab once daily  #30 x 0   Entered by:   Jeremy Johann CMA   Authorized by:   Neena Rhymes MD   Signed by:   Neena Rhymes MD on 02/04/2011   Method used:   Faxed to ...       [facility]  [address] # [id number]* (retail)       [address]       [place], [place]  [address]       Ph: [phone] or [phone]       Fax: [phone]   RxID:   [id number]

## 2011-02-10 NOTE — Progress Notes (Signed)
Summary: --alternative med  Phone Note Refill Request Call back at [phone] Message from:  Pharmacy  Refills Requested: Medication #1:  LISINOPRIL 10 MG TABS Take 1 tab once daily Call from pharmacy stating that this med does cause hair loss in some Pt. Pls advise if you would like to still fill Rx or would you like to switch Pt to another med. Per pharmacy the only med that does not cause hair loss is chlorthalidone. pls advise   Follow-up for Phone Call        can switch to Chlorthalidone 25mg  daily and recheck BMP in 3-4 weeks Follow-up by: Neena Rhymes MD,  February 05, 2011 4:46 PM  Additional Follow-up for Phone Call Additional follow up Details #1::        Left message to call office............Marland KitchenFelecia Deloach CMA  February 05, 2011 5:08 PM   Pt aware......Marland KitchenFelecia Deloach CMA  February 06, 2011 8:51 AM     New/Updated Medications: CHLORTHALIDONE 25 MG TABS (CHLORTHALIDONE) Take 1 tab  daily Prescriptions: CHLORTHALIDONE 25 MG TABS (CHLORTHALIDONE) Take 1 tab  daily  #30 x 0   Entered by:   Jeremy Johann CMA   Authorized by:   Neena Rhymes MD   Signed by:   Jeremy Johann CMA on 02/05/2011   Method used:   Faxed to ...       [facility]  [address] # [id number]* (retail)       [address]       [place], [place]  [address]       Ph: [phone] or [phone]       Fax: [phone]   RxID:   [id number]

## 2011-04-28 NOTE — Op Note (Signed)
Heidi Herrera, Heidi Herrera               ACCOUNT NO.:  [id number]   MEDICAL RECORD NO.:  [id number]          PATIENT TYPE:  INP   LOCATION:  2550                         FACILITY:  [facility]   PHYSICIAN:  Ines Bloomer, M.D. DATE OF BIRTH:  [birth year]   DATE OF PROCEDURE:  DATE OF DISCHARGE:                               OPERATIVE REPORT   PREOPERATIVE DIAGNOSIS:  Right lower lobe mass.   POSTOPERATIVE DIAGNOSIS:  Right lower lobe intrapulmonary sequestration.   SURGEON:  Ines Bloomer, M.D.   FIRST ASSISTANT:  Ms. Zadie Rhine PA-C.   ANESTHESIA:  General anesthesia.   OPERATION PERFORMED:  1. Right video-assisted thoracoscopic procedure.  2. Mini thoracotomy.  3. Resection of right lower lobe intrapulmonary sequestration.   This 45 year old patient was found to have a right lower lobe mass which  was positive on PET scan.  She was brought to the operating room for  possible resection.  After general anesthesia and a dual-lumen tube was  inserted, she was turned to the right lateral thoracotomy position and  was prepped and draped in the usual sterile manner.  Two trocar sites  were made.  In the anterior-posterior axillary line at the seventh  intercostal space, two trocars were inserted and we were able to the  mass near the inferior pulmonary ligament of the right lower lobe.  A 7-  cm incision was made over the sixth intercostal space and a small  Tuffier  was inserted, partially dividing the latissimus and reflecting  the serratus anteriorly.  We first took down the inferior pulmonary  ligament which was extremely scarred to the esophagus and we carefully  divided the inferior pulmonary  ligament exposing the esophagus.  As we  dissected medially toward the inferior pulmonary vein, we encountered an  arterial branch coming off the aorta, which confirmed that this was  probably an intrapulmonary sequestration.  The arterial branch was  dissected out.  Pictures were taken  of this, as well as pictures were  taken of the sequestration.  The branch was ligated with 0 silk, clipped  and divided.  Then the rest of the inferior pulmonary ligament was taken  down, dissecting the esophagus off the enlarged nodes, 9R and a 7 node.  Attention was turned to the inferior pulmonary vein and this was  dissected free up to its bifurcation and the basilar portion of the  inferior pulmonary vein  was divided with an Autosuture 60 stapler.  We  then were able to resect the sequestration with completion medial  basilar segmentectomy off the rest of the right lower lobe.  FloSeal was  applied to the staple line.  Two chest tubes were placed, a right-angle  chest tube and a straight chest tube.  A Marcaine block was done in the  usual fashion.  A single On-Q was inserted in the usual  fashion.  The chest was closed with two pericostals.  drilling the  seventh rib and passing around the fifth rib, #1 Vicryls and 2-0 Vicryl  in the subcutaneous tissue.  The patient was returned to the recovery  room  in stable condition.      Ines Bloomer, M.D.  Electronically Signed     DPB/MEDQ  D:  03/16/2008  T:  03/16/2008  Job:  [id number]   cc:   Osvaldo Shipper. Spruill, M.D.

## 2011-04-28 NOTE — Letter (Signed)
February 01, 2008   Osvaldo Shipper. Spruill, M.D.  [address] [address]  [place] [address]   Re:  NAREH, MATZKE               DOB:  1966/04/23   Dear Dorene Sorrow:   I saw Ms. Mccamish in the office today.  This 45 year old patient has had a  past history of hypertension and then developed left flank pain and  underwent an abdominal  CT scan which showed a horseshoe kidney with  multiple cysts as well as a calcified and chronic-appearing mass in the  medial aspect of the right lung.  There also were some low density  lesions in her liver. She underwent a CT scan of the chest which  revealed in the right lower lobe medial based re-segment that there was  a 3.6 x 4.7 cm lesion that had calcification and areas of necrosis.  She  is a nonsmoker.  She has had no fever, chills, excessive sputum.  Her  pulmonary function tests showed an FVC of 2.41 with an FEV1 of 1.64.  She has had no hemoptysis, fever or chills, excessive sputum.   PAST MEDICAL HISTORY:  Significant for hypertension and  hypercholesterolemia.   MEDICATIONS:  1. Diovan 160/12.5 mg.  2. Zinc.   She also has a history of GERD.   FAMILY HISTORY:  Positive for vascular disease in a sister.  Also  surgical history she had a myomectomy done in 2006.   SOCIAL HISTORY:  She has 1 child. Single. She is a Radio producer.  Does not smoke or drink, only occasionally.   REVIEW OF SYSTEMS:  VITAL SIGNS:  She is 215 pounds. She is 5 feet 5.  GENERAL:  Her weight has been stable.  CARDIAC:  She has had some left  chest pain, left flank pain.  No angina or atrial fibrillation.  PULMONARY:  No hemoptysis, fever, chills, excessive sputum.  GASTROINTESTINAL:  GERD.  Hiatal hernia.  Some mild dysphagia, abdominal  pain, and chronic constipation. GENITOURINARY: No dysuria, frequent  urination, or vascular disease.  VASCULAR:  No claudication, DVT, TIAs.  NEUROLOGICAL:  She has headaches.  MUSCULOSKELETAL:  No arthritis or  joint pain.   PSYCHIATRIC:  Treated for nervousness.  EYE/ENT:  The patient decreased in her eye sight.  No change in her  hearing.  HEMATOLOGICAL:  She had no problems with anemia.   PHYSICAL EXAMINATION:  VITAL SIGNS:  Blood pressure 122/81. Pulse 76.  Respirations  18.  SATs 98 percent.  HEENT:  Unremarkable.  NECK:  Supple without thyromegaly.  There is no supraclavicular axial  adenopathy.  CHEST:  Clear to auscultation and percussion.  HEART: Regular sinus rhythm.  No murmurs.  ABDOMEN: Soft.  There is no hepatosplenomegaly.  Bowel sounds are  normal.  EXTREMITIES:  Pulses are 1+.  There is no clubbing or edema.  NEUROLOGICAL:  She is oriented times 3. Sensory and motor is intact.  Cranial nerves are intact.   I feel that this is some type of probable benign mass in her right lower  lobe although I cannot say for sure given the necrosis in it.  I will  get a PET scan and then make a recommendation but given the size of the  mass, she probably will require resective therapy which will probably be  right lower lobectomy.  I will let you know after her PET scan.  I  appreciate the opportunity to see Ms. Godden.   Sincerely,  Ines Bloomer, M.D.  Electronically Signed   DPB/MEDQ  D:  02/01/2008  T:  02/02/2008  Job:  [id number]

## 2011-04-28 NOTE — Letter (Signed)
March 29, 2008   Osvaldo Shipper. Spruill, M.D.  [address] [address]  [place], [place] [address]   Re:  Heidi, Herrera               DOB:  August 28, 1966   Dear Dorene Sorrow:   I saw Heidi Herrera back today after we did a resection of an intrapulmonary  sequestration of her right lower lobe.  We removed her chest tube and  sutures.  Her chest x-ray showed normal postoperative changes.  She is  doing well overall.  I released her to go back to work on May 4th, and I  will see her back again in 4 weeks with a chest x-ray.   Ines Bloomer, M.D.  Electronically Signed   DPB/MEDQ  D:  03/29/2008  T:  03/29/2008  Job:  [id number]

## 2011-04-28 NOTE — H&P (Signed)
Heidi Herrera, Heidi Herrera               ACCOUNT NO.:  [id number]   MEDICAL RECORD NO.:  [id number]          PATIENT TYPE:  INP   LOCATION:  NA                           FACILITY:  [facility]   PHYSICIAN:  Ines Bloomer, M.D. DATE OF BIRTH:  June 11, 1966   DATE OF ADMISSION:  DATE OF DISCHARGE:                              HISTORY & PHYSICAL   HISTORY AND PHYSICAL:  This 45 year old patient was found to have a  mass in the medial aspect of the right lower lobe.  She had  flank pain  and got a CT scan in which she was found to have a horseshoe kidney with  mobile cyst and also multiple low density lesions in her liver.  CT scan  of the chest was then done and showed a 3.6 x 4.7 right lower lobe mass  with calcification and areas of necrosis.  Pulmonary function test  showed an SVC of 2.41 with an FEV1 of 1.64.  She has had no fever,  chills, excessive sputum, no hemoptysis.  A PET scan was done which  showed an SUV of 9 and some questionable activity of the nodes around  the mass.  She has been admitted for a right lower lobectomy with node  dissection.   PAST MEDICAL HISTORY:  1. Significant for hypertension, hypercholesterolemia. She takes      Diovan 160-12.5 daily and zinc.  2. She also has a history of GERD and takes H2 blockers for that.   FAMILY HISTORY:  Positive for vascular disease in a sister.   SURGICAL HISTORY:  She had a myomectomy in 2006.   SOCIAL HISTORY:  She is single, has one child, works as a Naval architect. Does not smoke and only drinks occasionally.   REVIEW OF SYSTEMS:  She is 215 pounds.  She is 5 foot 5.  Her weight has  been stable.  CARDIAC:  She has had no angina or atrial fibrillation,  but has had that left chest and flank pain.  PULMONARY:  See History of  Present Illness.  GI:  She has GERD and a hiatal hernia, mild dysphagia,  abdominal pain, and constipation.  GU:  No dysuria, frequent urination,  or kidney disease.  VASCULAR:  No claudication,  DVT, or TIAs.  NEUROLOGICAL:  She has headaches.  MUSCULOSKELETAL:  No arthritis or  joint pain.  PSYCHIATRIC:  Been treated for nervousness.  EYES/ENT:  She  has decrease in her eyesight, no change in her hearing.  HEMATOLOGICAL:  No problems with  anemia, clotting disorders, or problems with bleeding.   PHYSICAL EXAM:  Her blood pressure is 122/81, pulse 76, respirations 18,  saturations 96%.  She is a slightly obese female in no acute distress.  HEAD:  Atraumatic.  EYES:  Pupils equal and react to light and accommodation.  Extraocular  movements are normal.  EARS:  Tympanic membranes are intact.  NOSE:  There is no septal deviation.  THROAT:  Without lesions.  NECK:  Supple without thyromegaly.  There is supraclavicular or axillary  adenopathy.  She has no JVD.  CHEST:  Clear  to auscultation and percussion.  HEART:  Regular, rate and rhythm, no murmurs.  ABDOMEN:  Obese, soft, there is no hepatosplenomegaly.  Bowel sounds are  normal.  EXTREMITIES:  Pulses are 1+, there is no clubbing or edema.  NEUROLOGIC:  She is oriented x3.  Sensory and motor intact.  Cranial  nerves intact.   IMPRESSION:  1. Right lower lobe mass, rule out lung cancer, rule out a benign      mass.  2. Hypertension.  3. Obesity.   PLAN:  Right video-assisted thoracic surgery, right lower lobectomy with  node dissection.      Ines Bloomer, M.D.  Electronically Signed     DPB/MEDQ  D:  03/14/2008  T:  03/14/2008  Job:  [id number]

## 2011-04-28 NOTE — Assessment & Plan Note (Signed)
OFFICE VISIT   Heidi Herrera, Heidi Herrera  DOB:  Jun 28, 1966                                        Apr 25, 2008  CHART #:  [id number]   HISTORY OF PRESENTING ILLNESS:  This is a 45 year old African-American  female who was found to have a mass in the medial aspect of the right  lower lobe.  CT scan of the chest showed a 3.6 x 4.7 cm right lower lobe  mass with a calcification and necrosis.  PET scan showed an SUV of 9 and  some questionable activity of the nodes around the mass.  The patient  underwent a right lower lobectomy with lymph node dissection and  intrapulmonary sequestration by Dr. Edwyna Shell on 03/16/08.  Pathology  revealed acute-on-chronic inflammation with scattered granulomas.  No  evidence of malignancy.   Patient was last seen in the office on 03/29/08.  She was doing well  overall.  Chest x-ray at that time showed normal postoperative changes  as well.   Currently, her complaints include a funny sensation when she lies down,  in the right part of her chest, as well as some fatigue on the right  side of her back.  She denies any shortness of breath, fever, chills, or  night sweats.   PHYSICAL EXAMINATION:  General:  This is a pleasant 45 year old  Caucasian female in no acute distress who is alert, oriented and  cooperative.  The latest vital signs reveal the BP to be 115/75, heart  rate 91, respiratory rate 18, O2 sat 98% on room air.  Cardiovascular:  Regular rate and rhythm.  S1 and S2 without murmurs, rubs or gallops.  Pulmonary:  Clear to auscultation.  Slightly diminished at the right  base.  Extremities:  No clubbing, cyanosis or edema.  The right-sided  chest wound is well healed, clean and dry, no signs of infection.   Chest x-ray done today showed a small right pleural effusion, possibly  slightly increased from previously.   IMPRESSION/PLAN:  Overall, patient is doing very well.  She was  instructed that she may drive and return to  work.  She may also use her  treadmill.  Dr. Edwyna Shell discussed her aforementioned symptoms as most  likely related to her surgical wound and the nerves related to this and  that hopefully in time, her symptoms will improve.  She is to follow up  with Dr. Edwyna Shell in six weeks with a chest x-ray.   Doree Fudge, PA   DZ/MEDQ  D:  04/25/2008  T:  04/25/2008  Job:  [id number]   cc:   Osvaldo Shipper. Spruill, M.D.

## 2011-04-28 NOTE — Assessment & Plan Note (Signed)
OFFICE VISIT   LISVET, RASHEED  DOB:  [birth year]                                        June 06, 2008  CHART #:  [id number]   HISTORY OF PRESENT ILLNESS:  The patient presents today for followup  status post right lower lobe intrapulmonary sequestration done on March 16, 2008.  The patient was last seen in the office on Apr 25, 2008.  The  patient comes in today without complaints.  Denies any incisional pain,  shortness of breath, fevers, coughing, wheezing, or hemoptysis.  She is  back to regular activity.   PHYSICAL EXAMINATION:  VITALS:  Blood pressure of 129/88, pulse of 75,  respirations of 18, and O2 sats 96% on room air.  RESPIRATORY:  Clear to auscultation bilaterally.  CARDIAC:  Regular rate and rhythm.  Incisions are all healed.   IMAGING STUDIES:  PA and lateral chest x-ray done today is stable,  clear.  Seen by Dr. Edwyna Shell.   IMPRESSION AND PLAN:  The patient is continuing to progress well.  She  was seen and evaluated by Dr. Edwyna Shell.  We will plan to release the  patient from the office, but she is told if she has any surgical issues,  she is to contact us.  The patient agrees.   Ines Bloomer, M.D.  Electronically Signed   KMD/MEDQ  D:  06/06/2008  T:  06/07/2008  Job:  [id number]

## 2011-04-28 NOTE — Letter (Signed)
February 07, 2008   Osvaldo Shipper. Spruill, M.D.  [address] [address]  [place], [place] [address]   Re:  Heidi Herrera, Heidi Herrera               DOB:  08/15/1966   Dear Dorene Sorrow:   I saw Ms. Laviolette back today, and her PET scan is positive with SUB of 9  with some questionable increased activity in the nodes around her right  lower lobe mass.  I have strongly recommended that she have a right  lower lobectomy with node dissection.  She will think about this.  I  think this is the way to go.  I did explain to her that if she wanted a  needle biopsy, we could arrange it, but I still would recommend surgery  no matter what the needle biopsy showed.  Blood pressure was 144/88.  Pulse 68.  Respirations 18.  Saturations were 98%.  I will give her a  call.   I appreciate the opportunity in seeing Ms. Jahn.  This is a very  interesting case, particularly with the calcium in the mass, but again,  given the high uptake on the PET scan, this is a high probability that  this is some type of lung cancer.   Ines Bloomer, M.D.  Electronically Signed   DPB/MEDQ  D:  02/07/2008  T:  02/08/2008  Job:  [id number]

## 2011-04-28 NOTE — Discharge Summary (Signed)
NAMEDENISS, WORMLEY               ACCOUNT NO.:  [id number]   MEDICAL RECORD NO.:  [id number]          PATIENT TYPE:  INP   LOCATION:  3310                         FACILITY:  [facility]   PHYSICIAN:  Ines Bloomer, M.D. DATE OF BIRTH:  02/12/1966   DATE OF ADMISSION:  03/16/2008  DATE OF DISCHARGE:  02/06/2008                               DISCHARGE SUMMARY   FINAL DIAGNOSIS:  Right lower lobe mass, intrapulmonary sequestration.   SECONDARY DIAGNOSES:  1. Hypertension.  2. Hypercholesterolemia.  3. History of gastroesophageal reflux disease.  4. Status post myomectomy in 2006.   IN-HOSPITAL OPERATIONS AND PROCEDURES:  Right video-assisted  thoracoscopic surgery with right thoracotomy, resection of right lower  lobe intrapulmonary sequestration and sentinel node biopsy.   HISTORY AND PHYSICAL AND HOSPITAL COURSE:  The patient is a 45 year old  female who was found to have a mass in the medial aspect of the right  lower lobe.  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  CT scan of the chest was done and  showed a 3.6 x 4.7 right lower lobe mass with calcification areas and  necrosis.  Pulmonary function test showed an FVC of 2.41 with an FEV1 of  1.64.  She has had no fevers, chills, sputum, or hemoptysis.  PET scan  done showed an SUV of 9 and some questionable activity of the nodes  around the mass.  The patient was then reevaluated by Dr. Edwyna Shell.  Dr.  Edwyna Shell discussed with the patient about undergoing right lower lobectomy  with node dissection.  Risks and benefits were discussed with the  patient.  The patient acknowledged her understanding and agreed to  proceed.  Surgery was scheduled for March 16, 2008.  For details of the  patient's past medical history and physical exam, please see dictated  H&P.   The patient was taken to the operating room on March 16, 2008, where she  underwent a right video-assisted thoracoscopic surgery with right  thoracotomy, resection of right lower lobe, intrapulmonary sequestration  with sentinel node biopsy.  The patient tolerated this procedure well  and was transferred to the intensive care unit in stable condition.  Postoperatively, the patient was noted to be hemodynamically stable.  She was extubated following surgery.  Post-extubation, she was noted to  be alert and oriented x4, neuro intact.  The patient's postoperative  course was pretty much unremarkable.  Daily chest x-rays were obtained.  No air leak noted from Pleur-evac.  Posterior chest tube discontinued on  postop day 2 with the remaining chest tube discontinued postop day 3.  Followup chest x-ray after discontinuing remaining chest tube showed a  small right apical pneumothorax.  The patient used her incentive  spirometer during this time.  She was able to be weaned off oxygen,  sating greater than 90% on room air.  The patient's vital signs were  monitored.  She remained afebrile.  Heart rate and blood pressure stable  and she was restarted on her Diovan/HCTZ.  The patient remained in  normal sinus rhythm.  All incisions were clean, dry, and intact, and  healing well.  She is out of bed and ambulating without assistance.   LABORATORY DATA:  On postop day 3, showed a white count of 8.0,  hemoglobin of 10.3, hematocrit 31.0, and platelet count 254.  Sodium was  137, potassium 4.2, chloride of 103, bicarbonate 28, BUN 9, creatinine  0.85, and glucose of 110.   Case management was consulted for arrangements for discharge planning.  Home health nurse has been arranged.  The patient was tolerating diet  well prior to discharge home.  No nausea and vomiting noted.  She is  ambulating without difficulty.   The patient is tentatively ready for discharge home in the a.m., pending  PA and lateral chest x-ray remained stable.   FOLLOWUP  APPOINTMENTS:  Followup appointment has been arranged with Dr.  Edwyna Shell for March 29, 2008, at 4 p.m.  The patient will need to obtain PA  and lateral chest x-ray 30 minutes prior to this appointment.   ACTIVITY:  The patient is instructed no driving until released to do so,  no heavy lifting over 10 pounds.  She is told to ambulate 3-4 times per  day, progress as tolerated and to continue her breathing exercises.   INCISIONAL CARE:  The patient is told to shower, washing her incisions  using soap and water.  She is to contact the office, if she develops any  drainage or openings from any of her incision sites.   DIET:  The patient is to begin on diet to be low-fat, low-salt.   DISCHARGE MEDICATIONS:  1. Diovan/hydrochlorothiazide 160/12.5 mg daily.  2. __________ supplement daily.  3. Vitamin D daily.  4. Percocet 5/325 mg 1-2 tablets q.4-6 h. p.r.n.      Theda Belfast, [place]      Ines Bloomer, M.D.  Electronically Signed    KMD/MEDQ  D:  03/19/2008  T:  03/20/2008  Job:  [id number]

## 2011-05-01 NOTE — Discharge Summary (Signed)
Heidi Herrera, Heidi Herrera               ACCOUNT NO.:  [id number]   MEDICAL RECORD NO.:  [id number]          PATIENT TYPE:  INP   LOCATION:  9319                          FACILITY:  [facility]   PHYSICIAN:  Hal Morales, M.D.DATE OF BIRTH:  [birth year]   DATE OF ADMISSION:  12/01/2006  DATE OF DISCHARGE:  12/03/2006                               DISCHARGE SUMMARY   DISCHARGE DIAGNOSIS:  Symptomatic uterine fibroids.   OPERATION:  On the date of admission the patient underwent an abdominal  myomectomy tolerating procedure well.  The patient was found to have a  uterus which was enlarged to approximately 16 weeks size from which 21  uterine fibroids were recovered.  The largest of the patient's fibroids  measured 9 x 5 cm with the smallest measuring 5 x 2 mm.  The aggregate  weight of these fibroids was 419 grams.   HISTORY OF PRESENT ILLNESS:  Heidi Herrera is a 45 year old single African-  American female who presents for myomectomy because of symptomatic  uterine fibroids, menorrhagia and dysmenorrhea.  Please see the  patient's dictated history and physical examination for details.   PREOPERATIVE PHYSICAL EXAMINATION:  Blood pressure 142/82, height was 5  feet 5-1/2 inches tall, weight was 216 pounds.  General exam was within  normal limits.  Do note, however, on abdominal exam it was tender with  her right side being greater than the left without any guarding in  either lower quadrant.  There was a firm mass arising from the pelvis  which extended to the mid point between the symphysis pubis and  umbilicus.  Pelvic exam:  EGBUS was within normal limits.  Vagina was  rugose.  Cervix was nontender without lesions.  Uterus appeared 16 to 18  weeks size, irregular and tender.  Adnexa was without tenderness or  masses.  Rectovaginal was without tenderness or masses.   HOSPITAL COURSE:  On the date of admission the patient underwent  aforementioned procedure tolerating it well.  The patient's  postoperative hemoglobin was 7.0 (preop hemoglobin 12.0).  However, the  patient was asymptomatic with this throughout her hospital stay.  Remainder of hospital stay was unremarkable with patient resuming bowel  and bladder function by postop day #2 and therefore deemed ready for  discharge home.  The patient's comprehensive metabolic panel throughout  her hospital stay was essentially within normal limits.   DISCHARGE MEDICATIONS:  1. Percocet 5/325 one to two tablets every 4 hours as needed for pain.  2. Ibuprofen 600 mg with food every 6 hours for 3 days then as needed      for pain.  3. Phenergan 25 mg every 6 hours as needed for nausea.  4. Colace 100 mg two to three tablets daily until bowel movements are      regular.  5. Ferrous sulfate 325 mg twice daily for 6 weeks.   FOLLOW UP:  The patient is to call [facility] at [phone]  to schedule a 6 weeks postoperative visit.   DISCHARGE INSTRUCTIONS:  The patient was given a copy of [address]  [facility] postoperative instruction  sheet.  She was further  advised to avoid driving for 2 weeks, heavy lifting for 4 weeks,  intercourse for 6  weeks, that she may shower, walk up steps and should increase her  activities slowly.  The patient's diet was without restriction.   FINAL PATHOLOGY:  Benign leiomyomas clinically uterine fibroids.      Heidi Herrera.      Hal Morales, M.D.  Electronically Signed    EJP/MEDQ  D:  12/20/2006  T:  12/20/2006  Job:  [id number]

## 2011-05-01 NOTE — H&P (Signed)
NAMEISABELLA, ROEMMICH               ACCOUNT NO.:  [id number]   MEDICAL RECORD NO.:  [id number]          PATIENT TYPE:  AMB   LOCATION:  SDC                           FACILITY:  [facility]   PHYSICIAN:  Hal Morales, M.D.DATE OF BIRTH:  05/13/1966   DATE OF ADMISSION:  DATE OF DISCHARGE:                              HISTORY & PHYSICAL   HISTORY OF PRESENT ILLNESS:  Ms. Zaldivar is a 45 year old, single, African-  American female, who presents for myomectomy because of symptomatic  uterine fibroids, menorrhagia, and dysmenorrhea.  For the past two  years, the patient has had a worsening menstrual flow lasting seven days  requiring the change of a super tampon every one-and-a-half hours.  She  also reports 10/10 cramping, which she decreases to 3/10 only with  taking ibuprofen 1200 mg.  (The patient has been advised not to exceed  800 mg over an 8 hour period of ibuprofen.)  For the past month, patient  has experienced sharp, fleeting pain in her lower pelvic region (right  greater than left) without any identifiable triggers.  These will occur  intermittently and will stop her in her tracks.  She denies any fever,  nausea or vomiting, diarrhea, urinary tract symptoms, change in her  bowel movements, but does experience positional dyspareunia.  A pelvic  ultrasound in December of 2006 showed a uterus measuring 11.7 x 7 x 8.3  cm with three measurable fibroids being 6.8 cm, 5.3 cm, and 2.8 cm  respectively.  A TSH done in August of 2007 was within normal limits.  The patient was given medical options for management of her symptoms to  include hormonal therapy, the [facility] IUD, and uterine artery  embolization along with the surgical options of myomectomy and  hysterectomy.  After much deliberation, the patient has decided to  proceed with myomectomy.   OBSTETRICAL HISTORY:  Gravida 1, para 1-0-0-1.  Patient experienced  gestational diabetes.   GYNECOLOGIC HISTORY:  Menarche at 45 years old.   Last menstrual period  November 01, 2006.  She uses condoms for contraception.  Denies any  history of sexually transmitted diseases or abnormal Pap smears.  Her  last Pap smear was May of 2007.   PAST MEDICAL HISTORY:  1. Hypertension.  2. Gastroesophageal reflux disease.  3. TENDENCY TOWARDS CHELOID FORMATION.   PAST SURGICAL HISTORY:  Negative.   FAMILY HISTORY:  Cardiovascular disease, thyroid disease, rheumatoid  arthritis, stroke, depression, non-insulin-dependent diabetes mellitus,  and hypertension.   SOCIAL HISTORY:  The patient is single, and she works as a Naval architect.   HABITS:  She does not use tobacco, and she occasionally consumes  alcohol.   CURRENT MEDICATIONS:  1. Diovan/HCT 12.5/160 one tablet daily.  2. Motrin (as mentioned in History of Present Illness).  3. Centrum Silver one tablet daily.  4. Geritol one dose daily.  5. Tums two tablets daily.   The patient is ALLERGIC TO BIAXIN, which causes a rash.   REVIEW OF SYSTEMS:  Negative for chest pain, shortness of breath, vision  changes, headache, unilateral weakness and, except as mentioned in  History of Present Illness, the patient's review of systems is otherwise  negative.   PHYSICAL EXAMINATION:  VITAL SIGNS:  Blood pressure is 142/82, height is  5 feet 5-1/2 inches tall, weight is 216 pounds.  NECK:  Supple without masses.  There is no thyromegaly or cervical  adenopathy.  HEART:  Regular rate and rhythm.  There is no murmur.  LUNGS:  Clear to auscultation.  There are no wheezes, rales, or rhonchi.  BACK:  No CVA tenderness.  ABDOMEN:  Tender, right greater than left, without any guarding in both  lower quadrants.  There is a firm mass from the pelvis, which extends to  the mid point between the symphysis pubis and the umbilicus.  EXTREMITIES:  No clubbing, cyanosis, or edema.  PELVIC EXAM:  EG/BUS is within normal limits.  Vagina is rugous.  Cervix  is nontender without lesions.   Uterus appears 16 to 18-weeks size,  irregular, and tender.  Adnexa without tenderness or masses.  Rectovaginal without tenderness or masses.   IMPRESSION:  1. Symptomatic uterine fibroids.  2. Menorrhagia.  3. Dysmenorrhea.   DISPOSITION:  A discussion was held with patient regarding the  indications for her procedure along with its risks, which include but  are not limited to reaction to anesthesia, damage to adjacent organs,  infection, excessive bleeding, which may require a blood transfusion,  and the possible need for hysterectomy.  The patient verbalized  understanding of these risks and has consented to proceed with an  abdominal myomectomy at [facility] [place] on December 01, 2006, at 8:30 a.m.      [place] J. Adline Peals.    ______________________________  Hal Morales, M.D.    EJP/MEDQ  D:  11/29/2006  T:  11/29/2006  Job:  [id number]

## 2011-05-01 NOTE — Procedures (Signed)
NAMEVAISHALI, Heidi Herrera               ACCOUNT NO.:  [id number]   MEDICAL RECORD NO.:  [id number]          PATIENT TYPE:  OUT   LOCATION:  SLEEP CENTER                 FACILITY[facility]  [facility]   PHYSICIAN:  Clinton D. Maple Hudson, MD, FCCP, FACPDATE OF BIRTH:  1966/11/18   DATE OF STUDY:  01/28/2007                            NOCTURNAL POLYSOMNOGRAM   REFERRING PHYSICIAN:  Osvaldo Shipper. Spruill, M.D.   INDICATION FOR STUDY:  Hypersomnia with sleep apnea.   EPWORTH SLEEPINESS SCORE:  14/24, BMI 34.9, weight 210 pounds.   MEDICATIONS:  Home medications:  Diovan HCT, iron.   SLEEP ARCHITECTURE:  Total sleep time 342 minutes with sleep efficiency  88%.  Stage 1 was 5%, stage 2 74%, stages 3 and 4 absent.  REM 21% of  total sleep time.  Sleep latency 21 minutes, REM latency 72 minutes,  awake after sleep onset 26 minutes, arousal index 5.3.  No bedtime  medication was taken.   RESPIRATORY DATA:  Apnea-hypopnea (AHI, RDI) 0.4 obstructive events per  hour which was within normal limits (normal range 0-5 per hour).  This  reflected a total of 2 hypopneas, which occurred while sleeping supine.  REM AHI 1.7 per hour.  There were insufficient events to qualify for  CPAP titration on the study night.   OXYGEN DATA:  Moderate snoring with oxygen desaturation, to a nadir of  80%.  Mean oxygen saturation through the study 98% on room air.   CARDIAC DATA:  Normal sinus rhythm.   MOVEMENT-PARASOMNIA:  Occasional limb jerk with little effect on sleep,  insignificant.   IMPRESSIONS-RECOMMENDATIONS:  1. Unremarkable sleep architecture for a sleep center environment.  2. Negligible sleep disorder breathing events, AHI 0.4 per hour      (normal range 0-5 per hour) with moderate snoring and oxygen      desaturation to a nadir of 80%.      Clinton D. Maple Hudson, MD, [facility], FACP  Diplomate, Biomedical engineer of Sleep Medicine  Electronically Signed    CDY/MEDQ  D:  01/30/2007 13:00:43  T:  01/30/2007 21:59:49  Job:   [id number]

## 2011-05-01 NOTE — Op Note (Signed)
Heidi Herrera, Heidi Herrera               ACCOUNT NO.:  [id number]   MEDICAL RECORD NO.:  [id number]          PATIENT TYPE:  INP   LOCATION:  9319                          FACILITY:  [facility]   PHYSICIAN:  Hal Morales, M.D.DATE OF BIRTH:  04-10-1966   DATE OF PROCEDURE:  12/01/2006  DATE OF DISCHARGE:                               OPERATIVE REPORT   PREOPERATIVE DIAGNOSES:  Symptomatic uterine fibroids, menorrhagia and  dysmenorrhea.   POSTOPERATIVE DIAGNOSES:  Symptomatic uterine fibroids, menorrhagia and  dysmenorrhea.   OPERATION:  Multiple abdominal myomectomy.   SURGEON:  Hal Morales, M.D.   FIRST ASSISTANT:  Naima A. Normand Sloop, M.D.   ANESTHESIA:  General endotracheal anesthesia.   ESTIMATED BLOOD LOSS:  800 mL.   COMPLICATIONS:  None.   FINDINGS:  The uterus was enlarged to approximately 16 weeks size with  multiple myomata.  The tubes and ovaries appeared normal bilaterally.   SPECIMENS TO PATHOLOGY:  21 uterine fibroids, largest measured 9 by 5  cm, the smallest measured 5 by 2 mm.  The fibroids weighed 419 grams in  aggregate.   PROCEDURE:  The patient was taken to the operating room after  appropriate identification and placed on the operating table.  After the  attainment of adequate general anesthesia with the patient in the supine  position, the abdomen, perineum and vagina were prepped with multiple  layers of Betadine.  A Foley catheter was inserted into the bladder and  connected to straight drainage.  The abdomen was draped as a sterile  field.  The suprapubic region was infiltrated with 20 mL of 0.25%  Marcaine.  A suprapubic incision was made and the abdomen opened in  layers.  The peritoneum was entered.  Examination of the pelvis revealed  the above noted findings.  The uterus was elevated into the operative  field and surrounded by moist laparotomy pads.  The largest of the myoma  was in the right subserosal region.  The serosa was injected with a  dilute solution of Pitressin and incised and that myoma removed from the  myoma bed.  Three additional myomata that were subserosal were treated  in a similar manner and removed from the operative field.  An incision  was then made very in the midline fundus anteriorly to access a large  intramural myoma after the serosa had been injected with a dilute  solution of Pitressin.  A combination of blunt and sharp dissection was  used to remove the myoma from the myoma bed.  It was at this time that  the endometrial cavity was noted to be entered.  An additional four  myoma were removed through that incision, anteriorly, posteriorly, and  at the fundal region.  An additional four subserosal myomata that were  very small were removed by elevating the myoma, injecting with  Pitressin, and then using cautery for removal.  A posterior myoma was  identified and the overlying serosa injected with a dilute solution of  Pitressin and then incised.  A combination of blunt and sharp dissection  allowed removal of several myoma from that bed, as  well.  That myoma bed  was then closed with 0 Vicryl sutures and the serosa closed with a  running interlocking suture of 2-0 Vicryl.  The largest of the myoma  beds that had entered the endometrial cavity was then closed with  interrupted sutures of 0 Vicryl, then the serosa reapproximated with a  running interlocking suture of 0 Vicryl.  The myoma beds for the  subserosal myomas, of which there were three, were closed with running  interlocking sutures of 2-0 Vicryl.  Once all myoma beds had been  closed, the serosal surfaces were covered with Intercede after copious  irrigation had been carried out.  Once the Intercede had been placed,  the uterus was returned to the peritoneal cavity.  The abdominal  peritoneum was closed with a running suture of 2-0 Vicryl.  The rectus  muscles were reapproximated in the midline with a figure-of-eight suture  of 2-0 Vicryl.   The rectus fascia was closed with a running suture of 0  Vicryl from each apex to the midline and then tied in the midline.  The  subcutaneous tissue was copiously irrigated and made hemostatic with  Bovie cautery.  A subcutaneous Jackson-Pratt drain was placed through a  stab wound in the left lower quadrant.  The site of the incision was  then infiltrated subcutaneously with a total of 12.5 mg of betamethasone  as requested for the patient for keloid prevention.  The skin incision  was then closed with a subcuticular suture of 3-0 Monocryl.  Steri-  Strips were applied to the incision and the drain sewn in with a suture  of 0-0 silk.  The grenade was placed and a sterile dressing applied to  the incision.  The patient was awakened from general anesthesia and  taken to the recovery room in satisfactory condition having tolerated  the procedure well with sponge and instrument counts correct.      Hal Morales, M.D.  Electronically Signed     VPH/MEDQ  D:  12/01/2006  T:  12/01/2006  Job:  [id number]

## 2011-09-08 LAB — BASIC METABOLIC PANEL
CO2: 28
Calcium: 8.1 — ABNORMAL LOW
Chloride: 101
Chloride: 103
GFR calc Af Amer: >60
GFR calc Af Amer: >60
GFR calc non Af Amer: >60
Potassium: 4.2
Sodium: 136
Sodium: 137

## 2011-09-08 LAB — URINALYSIS, ROUTINE W REFLEX MICROSCOPIC
Ketones, ur: NEGATIVE
Nitrite: NEGATIVE
Protein, ur: NEGATIVE
Urobilinogen, UA: 0.2

## 2011-09-08 LAB — COMPREHENSIVE METABOLIC PANEL
AST: 17
BUN: 5 — ABNORMAL LOW
CO2: 24
CO2: 29
Calcium: 8.4
Calcium: 9.2
Chloride: 103
Creatinine, Ser: 0.91
Creatinine, Ser: 1.03
GFR calc Af Amer: >60
GFR calc Af Amer: >60
GFR calc non Af Amer: 58 — ABNORMAL LOW
GFR calc non Af Amer: >60
Glucose, Bld: 134 — ABNORMAL HIGH
Glucose, Bld: 92
Total Bilirubin: 0.3

## 2011-09-08 LAB — CBC
HCT: 29.8 — ABNORMAL LOW
HCT: 31 — ABNORMAL LOW
Hemoglobin: 10 — ABNORMAL LOW
Hemoglobin: 10.5 — ABNORMAL LOW
MCHC: 33.4
MCHC: 33.5
MCHC: 33.5
MCV: 85.5
MCV: 85.6
MCV: 86.1
Platelets: 254
RBC: 3.48 — ABNORMAL LOW
RBC: 3.61 — ABNORMAL LOW
RBC: 3.67 — ABNORMAL LOW
RBC: 4.07
WBC: 7.2
WBC: 8

## 2011-09-08 LAB — TYPE AND SCREEN

## 2011-09-08 LAB — BLOOD GAS, ARTERIAL
Acid-Base Excess: 2.6 — ABNORMAL HIGH
Acid-Base Excess: 4 — ABNORMAL HIGH
Drawn by: 206361
Drawn by: 29017
O2 Saturation: 95.8
TCO2: 30.1
pCO2 arterial: 48.2 — ABNORMAL HIGH
pH, Arterial: 7.392
pO2, Arterial: 80.7
pO2, Arterial: 87.2

## 2011-09-08 LAB — PROTIME-INR
INR: 1.1
Prothrombin Time: 13.9

## 2011-09-08 LAB — APTT: aPTT: 29

## 2012-05-23 DIAGNOSIS — L910 Hypertrophic scar: Secondary | ICD-10-CM | POA: Insufficient documentation

## 2012-05-23 DIAGNOSIS — L658 Other specified nonscarring hair loss: Secondary | ICD-10-CM | POA: Insufficient documentation

## 2012-05-23 DIAGNOSIS — L219 Seborrheic dermatitis, unspecified: Secondary | ICD-10-CM | POA: Insufficient documentation

## 2012-06-08 ENCOUNTER — Encounter: Payer: Self-pay | Admitting: Dietician

## 2012-06-08 ENCOUNTER — Encounter: Payer: BC Managed Care – PPO | Attending: Family Medicine | Admitting: Dietician

## 2012-06-08 NOTE — Progress Notes (Unsigned)
  Medical Nutrition Therapy:  Appt start time: 1630 end time:  1700.  Assessment:  Primary concerns today: Comes today to learn how to eat more healthy for helping to manage my hypertension, to keep my iron level up and not develop pre-diabetes.   MEDICATIONS: Completed review of medications  DIETARY INTAKE:  24-hr recall:  B (6:30 AM): water to take vitamins.  Oatmeal (pack, sugar-free maple) with pecans and 1/2 banana OR ground [place] patty, on honey oat bread, 1/2 at breakfast and this tended to hold her longer. Water 16 oz Snk (10:00 AM) :Water 8 oz and grapes and melon mixture 3 cups   L (12:00-2:00 PM): !/2 of the ground [place] sandwich, small tomato, sweet pepper (small),  Water 8 oz.  Snk (mid PM): cheese crackers or baked chips or pop tart. D (7-7:30 PM): Kidney beans 2 cups and baked chicken wing and leg and cornbread (3 CHO servings) drank water 24 oz, ginger ale 12 oz.  Snk (bedtime PM): Strawberry cheesecake snack and pecans (2 handfuls) Beverages: water, cranberry juice, gingerale  Recent physical activity: No regular exercise at this time.  Estimated energy needs:Ht: 65 in  Wt: 225.1 lb  BMI: 37.5 kg/m2 Adj WT:157 (71 kg) 1400-1500 calories 160-165 g carbohydrates 105-108 g protein 38-40 g fat  Progress Towards Goal(s):  In progress.   Nutritional Diagnosis:  [place]-3.3 Overweight/obesity As related to increased portions of starch foods at the evening meal, late meals, and a lack of physical activity..  As evidenced by weight of 225.1 with a BMI of 37.5 kg/m2.    Intervention:  Nutrition Advised to aim for regular meals and snacks.  To try to limit to 1400-1500 calories per day.  When trying to limit meats look to use the dried beans and peas or the starchy beans that have protein and fiber.  Continue to cook these yourself and not use the canned.  Try to limit sodium to 2000-2300 mg/day.  Increase the use of vegetables and some fruits that contain vitamin C with meals to  help with the up-take of iron.    Handouts given during visit include:  Nutritional strategies for weight loss  Yellow card with portion sizes of each of the food groups.  List of the foods rich in iron.  Monitoring/Evaluation:  Dietary intake, exercise, and body weight in 8 weeks.

## 2012-06-09 ENCOUNTER — Encounter: Payer: Self-pay | Admitting: Dietician

## 2012-06-09 NOTE — Patient Instructions (Addendum)
   Look to eat regular meals and snacks.  At breakfast, use the protein containing foods such as the [place] with a wheat bread to help support you throughout the morning.  When you have a snack, have a protein source (see the list of proteins for ideas) the protein will add food value but will be less easy to go to glucose and if you are not active.  Search the web for recipes that contain vegetables and some protein that can be cooked in large volume and then frozen for use at lunch and as a quick dinner.  Try to aim at keeping yourself at 1400-1500 calories per day.  Goal to aim for.  Consider using a smoothie made in the morning of [place] unflavored protein powder and adding fruits and maybe even some spinach.  Try to get busy exercising start with cardio and aim to work up to 150 minutes per week.  Try to keep your sodium intake to 2000-2300 mg per day.  Watch your food labels and spread it out at Breakfast= 500 mg, Lunch = 500 mg and Dinner = 500 mg and that would leave 800 mg for snacks.  Exchange your juice for one that is sugar free or low in sugar.  Aim for a total carbohydrate of 3 gms and a sugar level of 2 gm Examples: Diet V8 Splash or Diet Ocean Spray (both are sweetened with Splenda)  When using breads look for a fiber level of 2 gm or greater.  When using cereals look for a fiber level of 3 gm or greater.  I have included a copy of the Dash Diet for you.  I don't know if you have seen it before.  I made some suggestions for decreasing the calorie levels but yet honoring the principle of the diet.

## 2012-09-02 ENCOUNTER — Encounter: Payer: Self-pay | Admitting: Internal Medicine

## 2013-05-02 ENCOUNTER — Other Ambulatory Visit: Payer: Self-pay

## 2013-06-12 ENCOUNTER — Other Ambulatory Visit: Payer: Self-pay

## 2013-06-12 DIAGNOSIS — Z1231 Encounter for screening mammogram for malignant neoplasm of breast: Secondary | ICD-10-CM

## 2013-07-11 ENCOUNTER — Ambulatory Visit
Admission: RE | Admit: 2013-07-11 | Discharge: 2013-07-11 | Disposition: A | Discharge status: Home / Self Care | Payer: BC Managed Care – PPO | Source: Ambulatory Visit

## 2013-07-11 DIAGNOSIS — Z1231 Encounter for screening mammogram for malignant neoplasm of breast: Secondary | ICD-10-CM

## 2013-09-20 ENCOUNTER — Ambulatory Visit (INDEPENDENT_AMBULATORY_CARE_PROVIDER_SITE_OTHER): Payer: BC Managed Care – PPO | Admitting: Cardiovascular Disease

## 2013-09-20 ENCOUNTER — Encounter: Payer: Self-pay | Admitting: Cardiovascular Disease

## 2013-09-20 VITALS — BP 136/100 | HR 79 | Ht 65.5 in | Wt 211.9 lb

## 2013-09-20 DIAGNOSIS — I10 Essential (primary) hypertension: Secondary | ICD-10-CM

## 2013-09-20 DIAGNOSIS — F411 Generalized anxiety disorder: Secondary | ICD-10-CM

## 2013-09-20 DIAGNOSIS — R079 Chest pain, unspecified: Secondary | ICD-10-CM

## 2013-09-20 DIAGNOSIS — E669 Obesity, unspecified: Secondary | ICD-10-CM

## 2013-09-20 DIAGNOSIS — E785 Hyperlipidemia, unspecified: Secondary | ICD-10-CM

## 2013-09-20 DIAGNOSIS — I517 Cardiomegaly: Secondary | ICD-10-CM

## 2013-09-20 MED ORDER — NEBIVOLOL HCL 10 MG PO TABS: 10.0000 mg | ORAL_TABLET | Freq: Every day | ORAL | Status: DC

## 2013-09-20 NOTE — Patient Instructions (Signed)
START Bystolic 5mg  daily and increase to 10mg  daily if diastolic (lower number) remains above 90.  Samples given for 5 mg.  New Rx sent to pharmacy for the 10mg  tablets.  Your physician has requested that you have an echocardiogram. Echocardiography is a painless test that uses sound waves to create images of your heart. It provides your doctor with information about the size and shape of your heart and how well your heart's chambers and valves are working. This procedure takes approximately one hour. There are no restrictions for this procedure.  Your physician recommends that you schedule a follow-up appointment in: 6-8 weeks.

## 2013-09-20 NOTE — Progress Notes (Signed)
Patient ID: Heidi Herrera, female   DOB: 1966-07-02, 47 y.o.   MRN: [id number]       PATIENT PROFILE: Ms.Heidi Herrera is a 47 year old African American female who presents to the office today for evaluation of hypertension, sharp intermittent chest pains, as well as palpitations. Her primary care physician is Dr. Ocie Bob. She is self-referred.   HPI:  [name] is to at least a 15 year history of hypertension and has been on multiple medications since that time. Most recently she has been on triple combination therapy with Exforge HCT 5/160/12.5 mg which he takes daily. She also has a history of anemia which is felt to be due to fibroids resulting in heavy bleeding intermittently. She is status post myomectomy. She also is a history of partial right lobectomy in 2009 for lung mass which was benign. She tells me approximately 6 years ago she did undergo a stress nuclear scan at: Hospital which was normal.  Recently, she has noticed episodes of sharp intermittent chest pain. She has begun to exercise at least 3 days per week at the gym. She denies any exertional precipitation to this discomfort. Sharp pain typically lasts seconds and self resolves. She states she also notes fatigability. In the past she apparently did have a sleep study and was told up not having sleep apnea probably 6 or 7 years ago. She does admit to palpitations which seem to be stress mediated rate she denies any associated presyncope or syncope. She now presents for evaluation.  A review of her records indicates that in the past she did undergo a [place] heart diagnostic comprehensive laboratory assessment in 2012. At that time, her total cholesterol was 221 her LDL was significantly elevated at 155 9 HDL 173 per she also had increased April lipoprotein B. Subsequent lab work has shown improvement or apparently her last blood work was done by Dr. Parke Simmers in June. At electrolytes were normal. Thyroid function studies were normal.  She is anemic at 10.9 hemoglobin with hematocrit of 32.3. She has been on over-the-counter iron supplementation since that time. She tells me Dr. Parke Simmers we'll be repeating blood work in several weeks. Her cholesterol was improved at 169 triglycerides 144 HDL 38 LDL 102.  Past Medical History  Diagnosis Date  . GERD (gastroesophageal reflux disease)   . Hypertension   . Anemia   . Obesity     Past Surgical History  Procedure Laterality Date  . Lobectomy  2009  . Myomectomy  2007    Allergies  Allergen Reactions  . Biaxin [Clarithromycin]     The category of the Biaxin family.  Also allergic to water chestnuts.    Current Outpatient Prescriptions  Medication Sig Dispense Refill  . ALPRAZolam (XANAX) 0.5 MG tablet Take 0.125 mg by mouth as needed.       . Amlodipine-Valsartan-HCTZ (EXFORGE HCT) 5-160-12.5 MG TABS Take 1 tablet by mouth daily.      . Ascorbic Acid (VITAMIN C) 1000 MG tablet Take 2,000 mg by mouth 2 (two) times daily.       . Biotin 1000 MCG tablet Take 1,000 mcg by mouth once.      . calcium-vitamin D (OSCAL WITH D) 500-200 MG-UNIT per tablet Take 1 tablet by mouth 2 (two) times daily. Calcium 1000 mg with Magnesium 400 mg and zinc 15 mg, taking 1 capsule 2 times daily      . Coenzyme Q10 (CO Q 10) 100 MG CAPS Take 1 capsule by mouth daily.      Marland Kitchen  Cyanocobalamin (VITAMIN B 12 PO) Take 1 tablet by mouth daily.      Marland Kitchen FeFum-FePoly-FA-B Cmp-C-Biot (INTEGRA PLUS PO) Take 1 tablet by mouth daily.      Marland Kitchen ibuprofen (ADVIL,MOTRIN) 800 MG tablet Take 800 mg by mouth as needed for pain.      . Multiple Vitamins-Minerals (ALIVE ONCE DAILY WOMENS 50+ PO) Take 1 tablet by mouth daily.      Ailene Ards 3-6-9 Fatty Acids (OMEGA-3-6-9 PO) Take 1 capsule by mouth daily.      . Probiotic Product (PROBIOTIC DAILY PO) Take 1 capsule by mouth daily.      . psyllium (METAMUCIL) 58.6 % powder Take 1 packet by mouth 3 (three) times daily. Taking sugar free 2-3 times daily.       No current  facility-administered medications for this visit.    Social history is notable in that she is divorced for 21 years. She has one child age 59. She has her masters degree. She works at  [employer] as a Solicitor  Family History  Problem Relation Age of Onset  . Hypertension Mother   . Obesity Mother   . Stroke Mother   . Hypertension Sister   . Hyperlipidemia Sister   . Obesity Sister   . Hypertension Brother   . Obesity Brother   . Stroke Brother     ROS is negative for fever chills or night sweats she admits to being overweight for some time. She does wear cataract in the right eye. She denies rash. His remote history of lung mass for which she underwent right lobectomy 2009. She admits to palpitations. She denies chest tightness with exertion. The chest pain is sharp and lasts seconds. She denies wheezing. She does admit to heavy menses. She denies change in bowel or bladder habits. She at times notes some mild swelling. She denies myalgias. She denies neurologic symptoms. At times she does admit to some anxiety and stress seems to exacerbate palpitations. She denies residual sleepiness. She does snore mildly. She wakes up approximately 2 times at night to go to the bathroom. Other comprehensive system review is negative.  PE BP 136/100  Pulse 79  Ht 5' 5.5" (1.664 m)  Wt 211 lb 14.4 oz (96.117 kg)  BMI 34.71 kg/m2 Repeat blood pressure 134/94 General: Alert, oriented, no distress with mild obesity Skin: normal turgor, no rashes HEENT: Normocephalic, atraumatic. Pupils round and reactive; sclera anicteric; Fundi ** Nose without nasal septal hypertrophy Mouth/Parynx benign; Mallinpatti scale 3 Neck: No JVD, no carotid briuts Lungs: clear to ausculatation and percussion; no wheezing or rales Heart: RRR, s1 s2 normal over 6 systolic murmur. No S3 gallop. Abdomen: Mild central adiposity soft, nontender; no hepatosplenomehaly, BS+; abdominal aorta nontender  and not dilated by palpation. Pulses 2+ Extremities: no clubbinbg cyanosis or edema, Homan's sign negative  Neurologic: grossly nonfocal Psychological: Normal affect and mood   ECG: Sinus rhythm at 79 beats per minute. Possible LVH with increased voltage in aVL. Possible left atrial enlargement.  LABS:  BMET    Component Value Date/Time   NA 139 12/18/2010 1421   K 3.8 12/18/2010 1421   CL 103 12/18/2010 1421   CO2 28 12/18/2010 1421   GLUCOSE 70 12/18/2010 1421   BUN 13 12/18/2010 1421   CREATININE 1.0 12/18/2010 1421   CALCIUM 8.6 12/18/2010 1421   GFRNONAA 83.60 04/30/2010 0000   GFRAA 87 11/26/2008 1223     Hepatic Function Panel  Component Value Date/Time   PROT 7.8 04/30/2010 0000   ALBUMIN 3.8 04/30/2010 0000   AST 19 04/30/2010 0000   ALT 18 04/30/2010 0000   ALKPHOS 41 04/30/2010 0000   BILITOT 0.3 04/30/2010 0000   BILIDIR 0.1 04/30/2010 0000     CBC    Component Value Date/Time   WBC 4.5 04/30/2010 0000   RBC 3.86* 04/30/2010 0000   HGB 11.3* 04/30/2010 0000   HCT 33.2* 04/30/2010 0000   PLT 266.0 04/30/2010 0000   MCV 86.1 04/30/2010 0000   MCHC 34.0 04/30/2010 0000   RDW 15.2* 04/30/2010 0000   LYMPHSABS 1.5 04/30/2010 0000   MONOABS 0.2 04/30/2010 0000   EOSABS 0.3 04/30/2010 0000   BASOSABS 0.0 04/30/2010 0000     BNP No results found for this basename: probnp    Lipid Panel     Component Value Date/Time   CHOL 199 04/30/2010 0000   TRIG 62.0 04/30/2010 0000   HDL 49.40 04/30/2010 0000   CHOLHDL 4 04/30/2010 0000   VLDL 12.4 04/30/2010 0000   LDLCALC 137* 04/30/2010 0000     RADIOLOGY: No results found.   ASSESSMENT AND PLAN: My assessment is that Ms. Galloway is a very pleasant 47 year old African American female was a history of hypertension for at least 15 years. She may very well have left ventricular hypertrophy by virtue of aVL voltage greater than 11 mm on her ECG. She currently is on 3 drug regimen with Exforge HCT. She does have diastolic hypertension with  initial blood pressure 136/100 when taken by the nurse and 134/94 retaken by me. She has noticed palpitations. Am electing to get the systolic to her medical regimen which I think will be helpful rather than just metoprolol particularly with some of her fatigue symptomatology. She will start at 5 mg but then titrate this to 10 mg daily depending upon blood pressure and response to her previous palpitations. I did review the laboratory both from 2012 and most recently by Dr. Parke Simmers in June. She will have repeat blood work by Dr. Parke Simmers in several weeks. Presently, she will undergo a 2-D echo Doppler study to evaluate systolic and diastolic function as well as cardiac size. Her chest pain most likely is noncardiac and musculoskeletal in etiology. I did recommend weight loss and continued increased exercise prescription. I will see her back in the office in 6-8 weeks for followup Cardiologic evaluation.   Lennette Bihari, MD, [facility] 09/20/2013 9:19 AM

## 2013-09-21 ENCOUNTER — Encounter: Payer: Self-pay | Admitting: Cardiovascular Disease

## 2013-09-25 ENCOUNTER — Telehealth (HOSPITAL_COMMUNITY): Payer: Self-pay | Admitting: *Deleted

## 2013-09-25 NOTE — Telephone Encounter (Signed)
Forwarded to Dr. Tresa Endo to review.

## 2013-09-25 NOTE — Telephone Encounter (Signed)
Pt called her insurance company regarding the cost of the echo, and was told that she has a very high deductible. Pt does not want to pay than now in fear that she will have to have more testing next year. Is there any way that she can push the echo out until January and monitor her situation a different way until the echo can be done.

## 2013-09-29 ENCOUNTER — Telehealth: Payer: Self-pay | Admitting: Cardiovascular Disease

## 2013-09-29 NOTE — Telephone Encounter (Signed)
Please call-she is concerned about the medicine she is taking.

## 2013-09-29 NOTE — Telephone Encounter (Signed)
LMOM advising pt to d/c Bystolic if has not already.  Will increase the Exforge HCT from 5/160/12.5 to 10/160/12.5.  Unable to send to pharmacy as none listed.  Asked pt to call back with pharmacy info ASAP>

## 2013-09-29 NOTE — Telephone Encounter (Signed)
Returned patient's call. Was started on Bystolic at OV with Dr. Tresa Endo on 10/8, has never taken beta-blocker before. Reports that lips are swelling & feel dry/dehydrated, that she is itching under armpits and has rash. Reports that there feels like is a lump in throat when swallowing and L side of neck feel tight, like when you have a "crick in your neck". This has been going on for at least 2 days. Reports BP today was 115/86 and HR 55, which HR is much lower than her usually HR of about 70. Patient denies CP & SOB. Also stated that she saw PCP, Dr. Parke Simmers, yesterday and was told by PCP that this medication can cause depression - was a concern for patient. Informed patient would notify pharmacist and Dr. Tresa Endo. Also requested last office visit note be sent to Dr. Parke Simmers to review - RN routed last OV note to Dr. Parke Simmers per patient request.

## 2013-09-29 NOTE — Telephone Encounter (Signed)
Please call having problem with the Bystolic he gave her the other day.Would somebody call asap.

## 2013-10-02 ENCOUNTER — Telehealth: Payer: Self-pay | Admitting: Cardiovascular Disease

## 2013-10-02 NOTE — Telephone Encounter (Signed)
Got kristins msg from Friday  Pharmacy is [facility]  [phone]

## 2013-10-02 NOTE — Telephone Encounter (Signed)
ok 

## 2013-10-02 NOTE — Telephone Encounter (Signed)
Forwarded to scheduler. Heidi Herrera

## 2013-10-03 ENCOUNTER — Telehealth: Payer: Self-pay | Admitting: Cardiovascular Disease

## 2013-10-03 ENCOUNTER — Ambulatory Visit (HOSPITAL_COMMUNITY): Payer: BC Managed Care – PPO

## 2013-10-03 MED ORDER — AMLODIPINE-VALSARTAN-HCTZ 10-160-12.5 MG PO TABS: 1.0000 | ORAL_TABLET | Freq: Every day | ORAL | Status: DC

## 2013-10-03 NOTE — Telephone Encounter (Signed)
Only has 1 pill for today   Needs rx

## 2013-10-03 NOTE — Telephone Encounter (Signed)
Message forwarded to K. Alvstad, PharmD.  Rx was printed on yesterday.

## 2013-10-03 NOTE — Telephone Encounter (Signed)
Rx located and signed.  Will fax to pharmacy.    Call to pt and informed.  Verbalized understanding.

## 2013-10-03 NOTE — Telephone Encounter (Signed)
Her pharmacy is [facility] on [address]  Needs BP medicine called in  Please call as soon as possible

## 2013-10-24 ENCOUNTER — Ambulatory Visit (INDEPENDENT_AMBULATORY_CARE_PROVIDER_SITE_OTHER): Payer: BC Managed Care – PPO | Admitting: Internal Medicine

## 2013-10-24 VITALS — BP 110/80 | HR 69 | Temp 98.5°F | Resp 16 | Ht 65.0 in | Wt 207.0 lb

## 2013-10-24 DIAGNOSIS — M549 Dorsalgia, unspecified: Secondary | ICD-10-CM

## 2013-10-24 DIAGNOSIS — L723 Sebaceous cyst: Secondary | ICD-10-CM

## 2013-10-24 MED ORDER — HYDROCODONE-ACETAMINOPHEN 7.5-325 MG/15ML PO SOLN: 5.0000 mL | Freq: Four times a day (QID) | ORAL | Status: DC | PRN

## 2013-10-24 MED ORDER — DOXYCYCLINE HYCLATE 100 MG PO TABS: 100.0000 mg | ORAL_TABLET | Freq: Two times a day (BID) | ORAL | Status: DC

## 2013-10-24 NOTE — Patient Instructions (Signed)

## 2013-10-24 NOTE — Progress Notes (Signed)
   Patient ID: Heidi Herrera MRN: [id number], DOB: 06/24/1966, 47 y.o. Date of Encounter: 10/24/2013, 11:47 AM    PROCEDURE NOTE: Verbal consent obtained. Risks and benefits of the procedure were explained to the patient. Patient made an informed decision to proceed with the procedure. Betadine prep per usual protocol. Local anesthesia obtained with 1% lidocaine with epi 2 cc.   1 cm incision made with 11 blade along lesion.  Culture taken. Moderate amount of purulence expressed followed by sebaceous material.  Lesion explored revealing no loculations. Irrigated with normal saline.  Packed with 1/4 inch plain packing.  Dressed. Wound care instructions including precautions with patient. Patient tolerated the procedure well. Recheck in 48 hours.      Signed, Eula Listen, PA-C [facility] [place], [place] [address] [phone] 10/24/2013 11:47 AM

## 2013-10-24 NOTE — Progress Notes (Signed)
  Subjective:    Patient ID: Heidi Herrera, female    DOB: 1966/04/12, 47 y.o.   MRN: [id number]  HPI Pt presents with cyst on upper back. She states this has been there for years, but over the last couple weeks is has been getting more tender. Denies any fever/chills. She has had this cyst incised before, but years ago. It is only tender if she lies on her back. She states it has not been draining. Has many sebaceous cysts but only one enlged and a little inflammed.   Review of Systems     Objective:   Physical Exam  Vitals reviewed. Constitutional: She is oriented to person, place, and time. She appears well-developed and well-nourished. No distress.  HENT:  Head: Normocephalic.  Eyes: EOM are normal.  Cardiovascular: Normal rate.   Pulmonary/Chest: Effort normal.  Musculoskeletal: Normal range of motion.  Neurological: She is alert and oriented to person, place, and time. She exhibits normal muscle tone. Coordination normal.  Skin: Skin is intact. Lesion noted. There is erythema.     Cyst, fluctuant, tender, minimal erythema.   ID by Eula Listen PAc       Assessment & Plan:  ID cyst/Infected When healed have dermatology excise remaining cysts Doxycycline 100mg  bid/Vicodin prn pain

## 2013-10-26 ENCOUNTER — Ambulatory Visit (INDEPENDENT_AMBULATORY_CARE_PROVIDER_SITE_OTHER): Payer: BC Managed Care – PPO | Admitting: Physician Assistant

## 2013-10-26 VITALS — BP 126/82 | HR 80 | Temp 98.4°F | Resp 18 | Ht 65.0 in | Wt 208.0 lb

## 2013-10-26 DIAGNOSIS — L723 Sebaceous cyst: Secondary | ICD-10-CM

## 2013-10-26 LAB — WOUND CULTURE: Organism ID, Bacteria: NO GROWTH

## 2013-10-26 NOTE — Progress Notes (Signed)
  Subjective:    Patient ID: Heidi Herrera, female    DOB: 12-13-1966, 47 y.o.   MRN: [id number]  Wound Check      Heidi Herrera is a very pleasant 47 yr old female here for wound care following I&D of an infected sebaceous cyst 48 hours ago.  Previous note reviewed.  She reports she is doing much better.  Less pain though still a little tender.  Taking the antibiotics and tolerating them well.  No fever, chills, nausea, vomiting.     Review of Systems  Constitutional: Negative.   Respiratory: Negative.   Cardiovascular: Negative.   Gastrointestinal: Negative.   Musculoskeletal: Negative.   Skin: Positive for wound.  Neurological: Negative.        Objective:   Physical Exam  Vitals reviewed. Constitutional: She is oriented to person, place, and time. She appears well-developed and well-nourished. No distress.  HENT:  Head: Normocephalic and atraumatic.  Eyes: Conjunctivae are normal. No scleral icterus.  Pulmonary/Chest: Effort normal.  Neurological: She is alert and oriented to person, place, and time.  Skin: Skin is warm and dry.     Healing wound at mid upper back; mildly indurated still but with no erythema or warmth  Psychiatric: She has a normal mood and affect. Her behavior is normal.    Wound Care: Dressing and packing removed.  Packing saturated with drainage.  Wound bed is very shallow and healthy granulation tissue is visible.  Area cleansed with soap and water.  Dressing applied.      Assessment & Plan:  Sebaceous cyst   Heidi Herrera is a very pleasant 47 yr old female here for wound care following I&D of an infected sebaceous cyst 10/24/13.  Pt is doing well.  Area appears to be healing nicely.  The wound is very shallow, so I have not repacked it today.  Continue daily dressing changes until wound is completely closed.  Cx with no growth, but will finish doxycycline.  RTC if worsening or not improving.   Loleta Dicker MHS, PA-C [facility] 11/13/20148:34 AM

## 2013-11-06 ENCOUNTER — Ambulatory Visit: Payer: BC Managed Care – PPO | Admitting: Cardiovascular Disease

## 2013-11-15 ENCOUNTER — Ambulatory Visit: Payer: BC Managed Care – PPO | Admitting: Cardiovascular Disease

## 2013-12-15 ENCOUNTER — Ambulatory Visit (INDEPENDENT_AMBULATORY_CARE_PROVIDER_SITE_OTHER): Payer: BC Managed Care – PPO | Admitting: Family Medicine

## 2013-12-15 VITALS — BP 132/78 | HR 75 | Temp 98.9°F | Resp 18 | Ht 65.5 in | Wt 204.0 lb

## 2013-12-15 DIAGNOSIS — R05 Cough: Secondary | ICD-10-CM

## 2013-12-15 DIAGNOSIS — R059 Cough, unspecified: Secondary | ICD-10-CM

## 2013-12-15 DIAGNOSIS — N63 Unspecified lump in unspecified breast: Secondary | ICD-10-CM

## 2013-12-15 DIAGNOSIS — J029 Acute pharyngitis, unspecified: Secondary | ICD-10-CM

## 2013-12-15 DIAGNOSIS — J020 Streptococcal pharyngitis: Secondary | ICD-10-CM

## 2013-12-15 DIAGNOSIS — N6320 Unspecified lump in the left breast, unspecified quadrant: Secondary | ICD-10-CM

## 2013-12-15 LAB — POCT RAPID STREP A (OFFICE): RAPID STREP A SCREEN: POSITIVE — AB

## 2013-12-15 MED ORDER — PENICILLIN V POTASSIUM 500 MG PO TABS: 500.0000 mg | ORAL_TABLET | Freq: Two times a day (BID) | ORAL | Status: DC

## 2013-12-15 MED ORDER — HYDROCODONE-HOMATROPINE 5-1.5 MG/5ML PO SYRP: 5.0000 mL | ORAL_SOLUTION | Freq: Three times a day (TID) | ORAL | Status: DC | PRN

## 2013-12-15 NOTE — Patient Instructions (Addendum)
You have your mammogram scheduled at the [facility]  Address: [address], [place], [place] [address] [phone]  Use the penicillin for strep throat; you should feel better in the next couple of days.  Let me know if you do not feel better or if you feel worse!  Use the cough syrup as needed but remember it can make you feel drowsy

## 2013-12-15 NOTE — Progress Notes (Addendum)
[facility] [address], [place] [address] [phone]  Date:  12/15/2013   Name:  Heidi Herrera   DOB:  Sep 10, 1966   MRN:  [id number]  PCP:  Kennon Portela, MD    Chief Complaint: Cough, Sore Throat and lump in breast   History of Present Illness:  Heidi Herrera is a 48 y.o. very pleasant female patient who presents with the following: Pt is complaining of a moderate to severe dry cough that started 4 days ago. She is also complaining of an associated sore throat that also started 4 days ago. She reports taking Delsym for her cough with some relief- however this will wear off and she will start coughing again in about 2 hours. Pt denies fever or rhinorrhea.  Secondary to her chief complaint pt is complaining of lump in breast that she noticed the lump on 3 days ago. She denies ever having an abnormal mammogram. Her last one was in 4 months ago. This area is not tender.  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   COUGH 11/01/2009   IRRITABLE BOWEL, PREDOMINANTLY CONSTIPATION 10/09/2009   GRANULOMA 10/09/2009   ESOPHAGEAL STRICTURE 08/27/2009   GERD 08/27/2009   ABDOMINAL PAIN, UNSPECIFIED SITE 08/27/2009   ANXIETY 04/20/2008   HYPERTENSION 04/20/2008   UTERINE FIBROID 02/10/2007   OBESITY, NOS 02/10/2007   HYPERTENSION, BENIGN SYSTEMIC 02/10/2007   FIBROADENOSIS, BREAST 02/10/2007   DYSFUNCTIONAL UTERINE BLEEDING 02/10/2007    Past Medical History  Diagnosis Date   GERD (gastroesophageal reflux disease)    Hypertension    Anemia    Obesity    Sebaceous cyst     Past Surgical History  Procedure Laterality Date   Lobectomy  2009   Myomectomy  2007    History  Substance  Use Topics   Smoking status: Never Smoker    Smokeless tobacco: Never Used   Alcohol Use: Yes    Family History  Problem Relation Age of Onset   Hypertension Mother    Obesity Mother    Stroke Mother    Diabetes Mother    Hypertension Sister    Hyperlipidemia Sister    Obesity Sister    Diabetes Sister    Heart disease Sister    Mental illness Sister    Hypertension Brother    Obesity Brother    Stroke Brother    Mental illness Father    Hypertension Father    Cancer Brother    Stroke Brother    Diabetes Sister    Hypertension Sister    Mental illness Sister     Allergies  Allergen Reactions   Bystolic [Nebivolol Hcl] Anaphylaxis, Itching and Swelling   Biaxin [Clarithromycin]     The category of the Biaxin family.  Also allergic to water chestnuts.    Medication list has been reviewed and updated.  Current Outpatient Prescriptions on File Prior to Visit  Medication Sig Dispense Refill   Amlodipine-Valsartan-HCTZ 10-160-12.5 MG TABS Take 1 tablet by mouth daily.  30 tablet  6   Ascorbic Acid (VITAMIN C) 1000 MG tablet Take 2,000 mg by mouth 2 (two) times daily.        Biotin 1000 MCG tablet Take 1,000 mcg by  mouth once.       calcium-vitamin D (OSCAL WITH D) 500-200 MG-UNIT per tablet Take 1 tablet by mouth 2 (two) times daily. Calcium 1000 mg with Magnesium 400 mg and zinc 15 mg, taking 1 capsule 2 times daily       Coenzyme Q10 (CO Q 10) 100 MG CAPS Take 1 capsule by mouth daily.       Cyanocobalamin (VITAMIN B 12 PO) Take 1 tablet by mouth daily.       FeFum-FePoly-FA-B Cmp-C-Biot (INTEGRA PLUS PO) Take 1 tablet by mouth daily.       HYDROcodone-acetaminophen (HYCET) 7.5-325 mg/15 ml solution Take 5 mLs by mouth every 6 (six) hours as needed (or cough).  240 mL  0   Multiple Vitamins-Minerals (ALIVE ONCE DAILY WOMENS 50+ PO) Take 1 tablet by mouth daily.       Omega 3-6-9 Fatty Acids (OMEGA-3-6-9 PO) Take 1 capsule by mouth  daily.       Probiotic Product (PROBIOTIC DAILY PO) Take 1 capsule by mouth daily.       psyllium (METAMUCIL) 58.6 % powder Take 1 packet by mouth 3 (three) times daily. Taking sugar free 2-3 times daily.       ALPRAZolam (XANAX) 0.5 MG tablet Take 0.125 mg by mouth as needed.        doxycycline (VIBRA-TABS) 100 MG tablet Take 1 tablet (100 mg total) by mouth 2 (two) times daily.  20 tablet  0   ibuprofen (ADVIL,MOTRIN) 800 MG tablet Take 800 mg by mouth as needed for pain.       No current facility-administered medications on file prior to visit.    Review of Systems:  As per HPI, otherwise negative  Physical Examination: Filed Vitals:   12/15/13 1024  BP: 132/78  Pulse: 75  Temp: 98.9 F (37.2 C)  Resp: 18   Body mass index is 33.42 kg/(m^2).  GEN: WDWN, NAD, Non-toxic, A & O x 3 HEENT: Atraumatic, Normocephalic. Neck supple. No masses, No LAD.  Bilateral TM wnl, oropharynx normal.  PEERL,EOMI.   Ears and Nose: No external deformity. CV: RRR, No M/G/R. No JVD. No thrill. No extra heart sounds. PULM: CTA B, no wheezes, crackles, rhonchi. No retractions. No resp. distress. No accessory muscle use. ABD: S, NT, ND, +BS. No rebound. No HSM. EXTR: No c/c/e NEURO Normal gait.  PSYCH: Normally interactive. Conversant. Not depressed or anxious appearing.  Calm demeanor.  Breast; no dimpling or discharge bilaterally She does have a palpable mass in the left breast about 2cm in diameter.    Results for orders placed in visit on 12/15/13  POCT RAPID STREP A (OFFICE)      Result Value Range   Rapid Strep A Screen Positive (*) Negative    Assessment and Plan: Streptococcal sore throat - Plan: penicillin v potassium (VEETID) 500 MG tablet  Left breast mass - Plan: MM Digital Diagnostic Bilat, US Breast Bilateral  Acute pharyngitis - Plan: POCT rapid strep A  Cough - Plan: HYDROcodone-homatropine (HYCODAN) 5-1.5 MG/5ML syrup  Strep throat.  Treat with penicillin.  Hycodan  as needed for cough.   See patient instructions for more details.   Set up diagnostic mammogram for next week   Signed Lamar Blinks, MD

## 2013-12-19 ENCOUNTER — Other Ambulatory Visit: Payer: Self-pay | Admitting: Family Medicine

## 2013-12-19 ENCOUNTER — Ambulatory Visit
Admission: RE | Admit: 2013-12-19 | Discharge: 2013-12-19 | Disposition: A | Discharge status: Home / Self Care | Payer: BC Managed Care – PPO | Source: Ambulatory Visit | Attending: Family Medicine | Admitting: Family Medicine

## 2013-12-19 DIAGNOSIS — N6320 Unspecified lump in the left breast, unspecified quadrant: Secondary | ICD-10-CM

## 2014-01-02 ENCOUNTER — Telehealth: Payer: Self-pay | Admitting: Cardiovascular Disease

## 2014-01-02 ENCOUNTER — Telehealth: Payer: Self-pay | Admitting: *Deleted

## 2014-01-02 DIAGNOSIS — E782 Mixed hyperlipidemia: Secondary | ICD-10-CM

## 2014-01-02 DIAGNOSIS — E559 Vitamin D deficiency, unspecified: Secondary | ICD-10-CM

## 2014-01-02 DIAGNOSIS — Z79899 Other long term (current) drug therapy: Secondary | ICD-10-CM

## 2014-01-02 NOTE — Telephone Encounter (Signed)
Returned call and pt verified x 2.  Pt stated she just rescheduled her appt w/ Dr. Claiborne Billings.  Stated she was supposed to have blood work before her echo.  Stated Dr. Criss Rosales didn't order blood work and she doesn't have any results.  Stated she didn't order her echo b/c the blood work wasn't done.  Pt informed the blood work doesn't have anything to do with whether the labs are done before or after the echo.  Pt informed the last OV note in October stated she was supposed to have both done and f/u in 6-8 weeks.  Pt stated she would not schedule echo w/o blood work b/c she is anemic and wants all labs done so she doesn't have to get stuck twice.  Stated she will just come in anyway and get Dr. Claiborne Billings to order labs.  Pt informed Mariann Laster, [place] w/ Dr. Claiborne Billings will be notified r/t labs to be ordered as it is not necessary for her to come in on 2.10.15 to see Dr. Claiborne Billings w/o labs and echo being completed.    Mariann Laster notified and advised labs that Dr. Criss Rosales ordered can be ordered.  Pt informed and verbalized understanding and agreed w/ plan.  Pt agreed to present to [facility] lab fasting to have labs drawn.  Pt transferred to [facility] in Scheduling to schedule echo before appt w/ Dr. Claiborne Billings.

## 2014-01-02 NOTE — Telephone Encounter (Signed)
Returned call.  Pt stated the she called the lab and they told her the iron test wasn't ordered.  Pt informed RN ordered all labs that were on the fax received from Dr. Fransico Setters office.  Pt stated she needs her iron checked.  Pt informed again RN ordered the exact labs that were sent from Dr. Fransico Setters office.  Pt thanked Therapist, sports for calling and call ended.  Message forwarded to Mariann Laster to review and order if appropriate and contact pt.

## 2014-01-02 NOTE — Telephone Encounter (Signed)
Pt called to r/s her appointments. She stated that she never had bloodwork done that her PCP wanted her to have. She stated that Dr. Claiborne Billings wanted bloodwork done before her Echo. There is no order for blood work. She rescheduled her appointment with Dr. Claiborne Billings to Feb 10, but she did not r/s her echo because of this issue with the bloodwork.  TK

## 2014-01-02 NOTE — Telephone Encounter (Signed)
I spoke with the patient.  She was upset that an iron level was not ordered by Dr Claiborne Billings.  I dug a little deeper and realized that she was really talking about a HBG level, which is included in a CBC.  A CBC was ordered.  The patient wanted to make sure that the blood work that Dr Criss Rosales ordered back in June was repeated.  I reviewed that blood work and all the same labs were reordered.  I reassured patient and she was appreciative.

## 2014-01-02 NOTE — Telephone Encounter (Signed)
Has questions about some lab work that was ordered for her  Please call .Marland Kitchen

## 2014-01-05 LAB — LIPID PANEL
Cholesterol: 174 mg/dL (ref 0–200)
HDL: 42 mg/dL (ref >39)
LDL CALC: 120 mg/dL — AB (ref 0–99)
Total CHOL/HDL Ratio: 4.1 Ratio
Triglycerides: 58 mg/dL (ref <150)
VLDL: 12 mg/dL (ref 0–40)

## 2014-01-05 LAB — CBC WITH DIFFERENTIAL/PLATELET
Basophils Absolute: 0 10*3/uL (ref 0.0–0.1)
Basophils Relative: 1 % (ref 0–1)
EOS ABS: 0.2 10*3/uL (ref 0.0–0.7)
Eosinophils Relative: 5 % (ref 0–5)
HEMATOCRIT: 32.8 % — AB (ref 36.0–46.0)
Hemoglobin: 11 g/dL — ABNORMAL LOW (ref 12.0–15.0)
Lymphocytes Relative: 30 % (ref 12–46)
Lymphs Abs: 1.2 10*3/uL (ref 0.7–4.0)
MCH: 28.8 pg (ref 26.0–34.0)
MCHC: 33.5 g/dL (ref 30.0–36.0)
MCV: 85.9 fL (ref 78.0–100.0)
MONO ABS: 0.3 10*3/uL (ref 0.1–1.0)
Monocytes Relative: 7 % (ref 3–12)
Neutro Abs: 2.3 10*3/uL (ref 1.7–7.7)
Neutrophils Relative %: 57 % (ref 43–77)
PLATELETS: 300 10*3/uL (ref 150–400)
RBC: 3.82 MIL/uL — ABNORMAL LOW (ref 3.87–5.11)
RDW: 14.2 % (ref 11.5–15.5)
WBC: 4 10*3/uL (ref 4.0–10.5)

## 2014-01-05 LAB — COMPLETE METABOLIC PANEL WITH GFR
ALBUMIN: 3.6 g/dL (ref 3.5–5.2)
ALT: 9 U/L (ref 0–35)
AST: 14 U/L (ref 0–37)
Alkaline Phosphatase: 42 U/L (ref 39–117)
BUN: 10 mg/dL (ref 6–23)
CALCIUM: 9.1 mg/dL (ref 8.4–10.5)
CHLORIDE: 104 meq/L (ref 96–112)
CO2: 28 mEq/L (ref 19–32)
Creat: 1.05 mg/dL (ref 0.50–1.10)
GFR, Est African American: 72 mL/min
GFR, Est Non African American: 63 mL/min
Glucose, Bld: 82 mg/dL (ref 70–99)
POTASSIUM: 4 meq/L (ref 3.5–5.3)
Sodium: 139 mEq/L (ref 135–145)
Total Bilirubin: 0.3 mg/dL (ref 0.3–1.2)
Total Protein: 7.4 g/dL (ref 6.0–8.3)

## 2014-01-05 LAB — T3, FREE: T3 FREE: 2.6 pg/mL (ref 2.3–4.2)

## 2014-01-05 LAB — T4, FREE: Free T4: 1.05 ng/dL (ref 0.80–1.80)

## 2014-01-08 ENCOUNTER — Ambulatory Visit (HOSPITAL_COMMUNITY): Payer: BC Managed Care – PPO

## 2014-01-17 ENCOUNTER — Ambulatory Visit (HOSPITAL_COMMUNITY)
Admission: RE | Admit: 2014-01-17 | Discharge: 2014-01-17 | Disposition: A | Discharge status: Home / Self Care | Payer: BC Managed Care – PPO | Source: Ambulatory Visit | Attending: Cardiovascular Disease | Admitting: Cardiovascular Disease

## 2014-01-17 DIAGNOSIS — I369 Nonrheumatic tricuspid valve disorder, unspecified: Secondary | ICD-10-CM

## 2014-01-17 DIAGNOSIS — I059 Rheumatic mitral valve disease, unspecified: Secondary | ICD-10-CM | POA: Insufficient documentation

## 2014-01-17 DIAGNOSIS — R079 Chest pain, unspecified: Secondary | ICD-10-CM | POA: Insufficient documentation

## 2014-01-17 DIAGNOSIS — I379 Nonrheumatic pulmonary valve disorder, unspecified: Secondary | ICD-10-CM | POA: Insufficient documentation

## 2014-01-17 DIAGNOSIS — I517 Cardiomegaly: Secondary | ICD-10-CM | POA: Insufficient documentation

## 2014-01-17 DIAGNOSIS — I079 Rheumatic tricuspid valve disease, unspecified: Secondary | ICD-10-CM | POA: Insufficient documentation

## 2014-01-17 DIAGNOSIS — E785 Hyperlipidemia, unspecified: Secondary | ICD-10-CM | POA: Insufficient documentation

## 2014-01-17 DIAGNOSIS — I10 Essential (primary) hypertension: Secondary | ICD-10-CM | POA: Insufficient documentation

## 2014-01-17 NOTE — Progress Notes (Signed)
2D Echo Performed 01/17/2014    Railee Bonillas, RCS  

## 2014-01-23 ENCOUNTER — Ambulatory Visit: Payer: BC Managed Care – PPO | Admitting: Cardiovascular Disease

## 2014-02-01 ENCOUNTER — Ambulatory Visit (INDEPENDENT_AMBULATORY_CARE_PROVIDER_SITE_OTHER): Payer: BC Managed Care – PPO | Admitting: Cardiovascular Disease

## 2014-02-01 ENCOUNTER — Encounter: Payer: Self-pay | Admitting: Cardiovascular Disease

## 2014-02-01 VITALS — BP 152/100 | HR 68 | Ht 65.5 in | Wt 202.6 lb

## 2014-02-01 DIAGNOSIS — D259 Leiomyoma of uterus, unspecified: Secondary | ICD-10-CM

## 2014-02-01 DIAGNOSIS — D649 Anemia, unspecified: Secondary | ICD-10-CM

## 2014-02-01 DIAGNOSIS — I10 Essential (primary) hypertension: Secondary | ICD-10-CM

## 2014-02-01 LAB — IRON AND TIBC
%SAT: 23 % (ref 20–55)
Iron: 70 ug/dL (ref 42–145)
TIBC: 308 ug/dL (ref 250–470)
UIBC: 238 ug/dL (ref 125–400)

## 2014-02-01 LAB — FERRITIN: Ferritin: 17 ng/mL (ref 10–291)

## 2014-02-01 LAB — CBC
HEMATOCRIT: 36.2 % (ref 36.0–46.0)
HEMOGLOBIN: 11.8 g/dL — AB (ref 12.0–15.0)
MCH: 28 pg (ref 26.0–34.0)
MCHC: 32.6 g/dL (ref 30.0–36.0)
MCV: 86 fL (ref 78.0–100.0)
PLATELETS: 331 10*3/uL (ref 150–400)
RBC: 4.21 MIL/uL (ref 3.87–5.11)
RDW: 14.7 % (ref 11.5–15.5)
WBC: 5 10*3/uL (ref 4.0–10.5)

## 2014-02-01 NOTE — Patient Instructions (Signed)
Your physician recommends that you return for lab work.  Your physician recommends that you schedule a follow-up appointment in: 6 MONTHS. No changes were made today in your therapy.

## 2014-02-02 ENCOUNTER — Encounter: Payer: Self-pay | Admitting: Cardiovascular Disease

## 2014-02-02 DIAGNOSIS — D631 Anemia in chronic kidney disease: Secondary | ICD-10-CM | POA: Insufficient documentation

## 2014-02-02 DIAGNOSIS — D649 Anemia, unspecified: Secondary | ICD-10-CM | POA: Insufficient documentation

## 2014-02-02 NOTE — Progress Notes (Signed)
Patient ID: Heidi Herrera, female   DOB: 1965/12/18, 48 y.o.   MRN: [id number]        PATIENT PROFILE: Ms.Heidi Herrera is a 48 year old African American female who presents to the office today for follow-up evaluation of hypertension, sharp intermittent chest pains, as well as palpitations.    HPI:  [name] a 15 year history of hypertension and has been on multiple medications since that time.  Recently she has been on triple combination therapy with Exforge HCT 5/160/12.5 mg which he takes daily. She also has a history of anemia which is felt to be due to fibroids resulting in heavy bleeding intermittently. She is status post myomectomy. She also is a history of partial right lobectomy in 2009 for lung mass which was benign. She tells me approximately 6 years ago she did undergo a stress nuclear scan at [facility] which was normal.  Last fall, she had noticed episodes of sharp intermittent chest pain. She has begun to exercise at least 3 days per week at the gym. She denies any exertional precipitation to this discomfort. Sharp pain typically lasts seconds and self resolves. She states she also notes fatigability. In the past she apparently did have a sleep study and was told up not having sleep apnea probably 6 or 7 years ago. She does admit to palpitations which seem to be stress mediated rate she denies any associated presyncope or syncope.   A review of her records indicates that in the past she did undergo a [place] heart diagnostic comprehensive laboratory assessment in 2012. At that time, her total cholesterol was 221 her LDL was significantly elevated at 155 9 HDL 173 per she also had increased April lipoprotein B. Subsequent lab work has shown improvement or apparently her last blood work was done by Dr. Criss Rosales in June. At electrolytes were normal. Thyroid function studies were normal. She is anemic at 10.9 hemoglobin with hematocrit of 32.3. She has been on over-the-counter iron  supplementation since that time. She tells me Dr. Criss Rosales we'll be repeating blood work in several weeks. Her cholesterol was improved at 169 triglycerides 144 HDL 38 LDL 102.  When I initially saw her 4 months ago, she was hypertensive and I changed her Exforge HCT to 10/160/12.5. She states her blood pressure has been significantly improved with this adjustment and she brought with her today records of blood pressure readings at home she underwent a 2-D echo Doppler study which was done on 01/17/2014. This showed ejection fraction of 55-60%. There was evidence for left ventricular hypertrophy with IVC measuring 17 mm and posterior wall measuring approximately 11 mm. Her left atrium was mildly dilated at 42 mm. She had normal diastolic parameters.  Subsequent blood work done on 01/05/2014 showed a normal CMet, her GFR was 72 mL per minute. She continued to be anemic with a hemoglobin of 11 hematocrit 32.8. In the past she had been on iron but had not been taking this recently. Her MCV was 85.9. She has also taken B12. Her cholesterol was 174 triglycerides 58 HDL 42 LDL 120. Thyroid studies were normal.  Past Medical History  Diagnosis Date  . GERD (gastroesophageal reflux disease)   . Hypertension   . Anemia   . Obesity   . Sebaceous cyst     Past Surgical History  Procedure Laterality Date  . Lobectomy  2009  . Myomectomy  2007    Allergies  Allergen Reactions  . Bystolic [Nebivolol Hcl] Anaphylaxis, Itching and  Swelling  . Biaxin [Clarithromycin]     The category of the Biaxin family.  Also allergic to water chestnuts.    Current Outpatient Prescriptions  Medication Sig Dispense Refill  . Amlodipine-Valsartan-HCTZ 10-160-12.5 MG TABS Take 1 tablet by mouth daily.  30 tablet  6  . Ascorbic Acid (VITAMIN C) 1000 MG tablet Take 2,000 mg by mouth 2 (two) times daily.       . Biotin 1000 MCG tablet Take 1,000 mcg by mouth once.      . calcium-vitamin D (OSCAL WITH D) 500-200 MG-UNIT per  tablet Take 1 tablet by mouth 2 (two) times daily. Calcium 1000 mg with Magnesium 400 mg and zinc 15 mg, taking 1 capsule 2 times daily      . Cyanocobalamin (VITAMIN B 12 PO) Take 1 tablet by mouth daily.      Marland Kitchen FeFum-FePoly-FA-B Cmp-C-Biot (INTEGRA PLUS PO) Take 1 tablet by mouth daily.      Marland Kitchen ibuprofen (ADVIL,MOTRIN) 800 MG tablet Take 800 mg by mouth as needed for pain.      . Multiple Vitamins-Minerals (ALIVE ONCE DAILY WOMENS 50+ PO) Take 1 tablet by mouth daily.      Ernestine Conrad 3-6-9 Fatty Acids (OMEGA-3-6-9 PO) Take 1 capsule by mouth daily.      . Probiotic Product (PROBIOTIC DAILY PO) Take 1 capsule by mouth daily.      . psyllium (METAMUCIL) 58.6 % powder Take 1 packet by mouth 3 (three) times daily. Taking sugar free 2-3 times daily.       No current facility-administered medications for this visit.    Social history is notable in that she is divorced for 21 years. She has one child age 20. She has her masters degree. 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 Hypertension Mother   . Obesity Mother   . Stroke Mother   . Diabetes Mother   . Hypertension Sister   . Hyperlipidemia Sister   . Obesity Sister   . Diabetes Sister   . Heart disease Sister   . Mental illness Sister   . Hypertension Brother   . Obesity Brother   . Stroke Brother   . Mental illness Father   . Hypertension Father   . Cancer Brother   . Stroke Brother   . Diabetes Sister   . Hypertension Sister   . Mental illness Sister     ROS is negative for fever chills or night sweats she admits to being overweight for some time. She does have a cataract in the right eye. She denies rash. His remote history of lung mass for which she underwent right lobectomy 2009. She admits to palpitations. She denies chest tightness with exertion. Her previous sharp chest pain has resolved. She denies wheezing. She does admit to heavy menses. She denies  change in bowel or bladder habits. She at times notes some mild swelling. She denies myalgias. She denies neurologic symptoms. At times she does admit to some anxiety and stress seems to exacerbate palpitations. She denies residual sleepiness. She does snore mildly. She wakes up approximately 2 times at night to go to the bathroom. Other comprehensive system review is negative.  PE BP 152/100  Pulse 68  Ht 5' 5.5" (1.664 m)  Wt 202 lb 9.6 oz (91.899 kg)  BMI 33.19 kg/m2 Repeat blood pressure by me was markedly improved at 122/80. Repeat blood pressure 134/94  General: Alert, oriented, no distress with mild obesity Skin: normal turgor, no rashes HEENT: Normocephalic, atraumatic. Pupils round and reactive; sclera anicteric; Fundi mild vessel tortuosity. No hemorrhages or exudates. No papilledema Nose without nasal septal hypertrophy Mouth/Parynx benign; Mallinpatti scale 3 Neck: No JVD, no carotid bruits normal carotid Lungs: clear to ausculatation and percussion; no wheezing or rales Chest: No tenderness to palpation Heart: RRR, s1 s2 normal over 6 systolic murmur. No S3 gallop. Abdomen: Mild central adiposity soft, nontender; no hepatosplenomehaly, BS+; abdominal aorta nontender and not dilated by palpation. Back: No CVA tenderness Pulses 2+ Extremities: no clubbinbg cyanosis or edema, Homan's sign negative  Neurologic: grossly nonfocal Psychological: Normal affect and mood   ECG (independently read by me): Sinus rhythm at 68 beats per minute. QTC 455 ms.  Prior ECG of October 2014: Sinus rhythm at 79 beats per minute. Possible LVH with increased voltage in aVL. Possible left atrial enlargement.  LABS:  BMET    Component Value Date/Time   NA 139 01/05/2014 0823   K 4.0 01/05/2014 0823   CL 104 01/05/2014 0823   CO2 28 01/05/2014 0823   GLUCOSE 82 01/05/2014 0823   BUN 10 01/05/2014 0823   CREATININE 1.05 01/05/2014 0823   CREATININE 1.0 12/18/2010 1421   CALCIUM 9.1 01/05/2014 0823     GFRNONAA 83.60 04/30/2010 0000   GFRAA 87 11/26/2008 1223     Hepatic Function Panel     Component Value Date/Time   PROT 7.4 01/05/2014 0823   ALBUMIN 3.6 01/05/2014 0823   AST 14 01/05/2014 0823   ALT 9 01/05/2014 0823   ALKPHOS 42 01/05/2014 0823   BILITOT 0.3 01/05/2014 0823   BILIDIR 0.1 04/30/2010 0000     CBC    Component Value Date/Time   WBC 5.0 02/01/2014 1614   RBC 4.21 02/01/2014 1614   HGB 11.8* 02/01/2014 1614   HCT 36.2 02/01/2014 1614   PLT 331 02/01/2014 1614   MCV 86.0 02/01/2014 1614   MCH 28.0 02/01/2014 1614   MCHC 32.6 02/01/2014 1614   RDW 14.7 02/01/2014 1614   LYMPHSABS 1.2 01/05/2014 0823   MONOABS 0.3 01/05/2014 0823   EOSABS 0.2 01/05/2014 0823   BASOSABS 0.0 01/05/2014 0823     BNP No results found for this basename: probnp    Lipid Panel     Component Value Date/Time   CHOL 174 01/05/2014 0823   TRIG 58 01/05/2014 0823   HDL 42 01/05/2014 0823   CHOLHDL 4.1 01/05/2014 0823   VLDL 12 01/05/2014 0823   LDLCALC 120* 01/05/2014 0823     RADIOLOGY: No results found.   ASSESSMENT AND PLAN: Ms. Glidewell is a very pleasant 48 year old African American female was a history of hypertension for at least 15 years. She has been on a triple drug regimen with a Exforge HCT at 10/160/12.5 milligrams. An echo Doppler study has demonstrated normal systolic and diastolic function. She does however have asymmetric septal thickness with her septum measuring 17 mm. On recheck by me today her blood pressure is improved and I did review her blood pressure readings which she brought in today from November 21 through February 18 which consistently has shown systolic blood pressures have been well controlled ranging from 118 to 130 maximally and with diastolic pressures in the upper 70s to low 80s. She does have a history of heavy menses due to fibroids and is status post prior myomectomy. She is still anemic. I am recommending followup laboratory consisting of iron,  TIBC, ferritin,  B12, and folate levels. I commended her on her weight loss and she has lost 9 pounds since her initial evaluation with me 4 months ago. We discussed further weight loss. She is sleeping better. Palpitations have resolved. I will contact her regarding her laboratory results and we'll see her in 6 months for followup evaluation.  Troy Sine, MD, [facility] 02/02/2014 6:45 PM

## 2014-02-07 ENCOUNTER — Encounter: Payer: Self-pay | Admitting: *Deleted

## 2014-02-16 ENCOUNTER — Telehealth: Payer: Self-pay | Admitting: Cardiovascular Disease

## 2014-02-16 ENCOUNTER — Ambulatory Visit: Payer: BC Managed Care – PPO | Admitting: Dietician

## 2014-02-16 MED ORDER — INTEGRA PLUS PO CAPS: 1.0000 | ORAL_CAPSULE | Freq: Every day | ORAL | Status: DC

## 2014-02-16 NOTE — Telephone Encounter (Signed)
Need a new prescription Integra Plus Caps #30.Please call to [facility] [phone]

## 2014-02-16 NOTE — Telephone Encounter (Signed)
Refills sent to pharmacy. 

## 2014-02-22 ENCOUNTER — Encounter: Payer: Self-pay | Admitting: Dietician

## 2014-02-22 ENCOUNTER — Encounter: Payer: BC Managed Care – PPO | Attending: Family Medicine | Admitting: Dietician

## 2014-02-22 VITALS — Ht 65.5 in | Wt 206.8 lb

## 2014-02-22 DIAGNOSIS — I10 Essential (primary) hypertension: Secondary | ICD-10-CM | POA: Insufficient documentation

## 2014-02-22 DIAGNOSIS — Z713 Dietary counseling and surveillance: Secondary | ICD-10-CM | POA: Insufficient documentation

## 2014-02-22 DIAGNOSIS — E669 Obesity, unspecified: Secondary | ICD-10-CM

## 2014-02-22 NOTE — Progress Notes (Signed)
  Medical Nutrition Therapy:  Appt start time: 0347 end time:  1730.   Assessment:  Primary concerns today: Heidi Herrera states that she is here because she wants to lose weight, lower her blood pressure, and improve her overall health. Heidi Herrera reports that she doesn't eat out very much. She works in an IT sales professional. Per her report, she has difficulty controlling portions of certain foods, especially sweets.    Preferred Learning Style:   No preference indicated   Learning Readiness:   Ready   MEDICATIONS: see list  Usual physical activity: goes to the gym, needs to get back into routine  Estimated energy needs: 1800-2000 calories  Progress Towards Goal(s):  In progress.   Nutritional Diagnosis:  [place]-2.2 Altered nutrition-related laboratory As related to overweight, inappropriate food choices, and family history.  As evidenced by diagnoses of HTN and hyperlipidemia.    Intervention:  Nutrition counseling provided.  -Reduce sodium  -Stop adding salt to foods; start by adding a little less salt every time  -Use salt-free spices and herbs to flavor food  -Train taste buds to be satisfied with less salt  -Stick with fresh or frozen vegetables  -Choose low sodium or no salt added options  -Rinse canned foods when appropriate  -Try to pre-prepare foods when possible   -Watch portions of treats -Keep "trigger" foods out of the house -Have fruit to curb sweet craving -Get back into your exercise routine   -Snacks:  -Smoothies  -Boiled eggs  -String cheese  -Fruit  -Hummus  -Cheese and crackers  -Crackers and almond butter  -Yogurt  Teaching Method Utilized:  Visual Auditory  Handouts given during visit include:  Hypertension  15g CHO + protein snacks  Low sodium flavoring tips  Barriers to learning/adherence to lifestyle change: food preferences and cravings  Demonstrated degree of understanding via:  Teach Back   Monitoring/Evaluation:  Dietary intake, exercise,  and body weight in 6 week(s).

## 2014-02-22 NOTE — Patient Instructions (Addendum)
-  Reduce sodium  -Stop adding salt to foods; start by adding a little less salt every time  -Use salt-free spices and herbs to flavor food  -Train taste buds to be satisfied with less salt  -Stick with fresh or frozen vegetables  -Choose low sodium or no salt added options  -Rinse canned foods when appropriate  -Try to pre-prepare foods when possible   -Watch portions of treats -Keep "trigger" foods out of the house -Have fruit to curb sweet craving -Get back into your exercise routine   -Snacks:  -Smoothies  -Boiled eggs  -String cheese  -Fruit  -Hummus  -Cheese and crackers  -Crackers and almond butter  -Yogurt

## 2014-04-04 ENCOUNTER — Other Ambulatory Visit: Payer: Self-pay | Admitting: Obstetrics and Gynecology

## 2014-04-05 ENCOUNTER — Encounter: Payer: BC Managed Care – PPO | Attending: Family Medicine | Admitting: Dietician

## 2014-04-05 DIAGNOSIS — E669 Obesity, unspecified: Secondary | ICD-10-CM

## 2014-04-05 DIAGNOSIS — I10 Essential (primary) hypertension: Secondary | ICD-10-CM | POA: Insufficient documentation

## 2014-04-05 DIAGNOSIS — Z713 Dietary counseling and surveillance: Secondary | ICD-10-CM | POA: Insufficient documentation

## 2014-04-05 NOTE — Patient Instructions (Addendum)
-  Reduce sodium  -Stop adding salt to foods; start by adding a little less salt every time  -Use salt-free spices and herbs to flavor food  -Train taste buds to be satisfied with less salt  -Stick with fresh or frozen vegetables  -Choose low sodium or no salt added options  -Rinse canned foods when appropriate  -Try to pre-prepare foods when possible   -Watch portions of treats -Keep "trigger" foods out of the house -Have fruit to curb sweet craving  -Continue to exercise  -Fill up on non-starchy vegetables (any veggie except corn, peas, or potatoes)  -Healthy weight loss = 1 to 2 pounds per week   -Snacks:  -Smoothies  -Boiled eggs  -String cheese  -Fruit  -Hummus  -Cheese and crackers  -Crackers and almond butter  -Yogurt

## 2014-04-05 NOTE — Progress Notes (Signed)
  Medical Nutrition Therapy:  Appt start time: 515 end time:  530.  Follow-up: Heidi Herrera returns today discouraged about her progress and does not want to weigh today. She reports she is still eating too many sweets, but trying to portion. She is using more herbs (basil, thyme) to flavor her food. Using [place] salt but using less. She has been trying healthier snacks like string cheese and almond butter.  Preferred Learning Style:   No preference indicated   Learning Readiness:   Ready   MEDICATIONS: see list  Usual physical activity: tries to go 5x a week; weights and elliptical or treadmill  Estimated energy needs: 1800-2000 calories  Progress Towards Goal(s):  In progress.   Nutritional Diagnosis:  [place]-2.2 Altered nutrition-related laboratory As related to overweight, inappropriate food choices, and family history.  As evidenced by diagnoses of HTN and hyperlipidemia.    Intervention:  Nutrition counseling provided. -Reduce sodium  -Stop adding salt to foods; start by adding a little less salt every time  -Use salt-free spices and herbs to flavor food  -Train taste buds to be satisfied with less salt  -Stick with fresh or frozen vegetables  -Choose low sodium or no salt added options  -Rinse canned foods when appropriate  -Try to pre-prepare foods when possible   -Watch portions of treats -Keep "trigger" foods out of the house -Have fruit to curb sweet craving  -Continue to exercise  -Fill up on non-starchy vegetables (any veggie except corn, peas, or potatoes)  -Healthy weight loss = 1 to 2 pounds per week   -Snacks:  -Smoothies  -Boiled eggs  -String cheese  -Fruit  -Hummus  -Cheese and crackers  -Crackers and almond butter  -Yogurt   Teaching Method Utilized:  Visual Auditory  Handouts given during visit include:  MyPlate  Barriers to learning/adherence to lifestyle change: food preferences and cravings  Demonstrated degree of understanding  via:  Teach Back   Monitoring/Evaluation:  Dietary intake, exercise, and body weight in 8 week(s).

## 2014-04-07 ENCOUNTER — Other Ambulatory Visit: Payer: Self-pay | Admitting: Pharmacist Clinician (PhC)/ Clinical Pharmacy Specialist

## 2014-04-09 NOTE — Telephone Encounter (Signed)
Rx was sent to pharmacy electronically. 

## 2014-05-29 ENCOUNTER — Other Ambulatory Visit: Payer: Self-pay

## 2014-05-29 DIAGNOSIS — Z1231 Encounter for screening mammogram for malignant neoplasm of breast: Secondary | ICD-10-CM

## 2014-06-05 ENCOUNTER — Ambulatory Visit: Payer: BC Managed Care – PPO | Admitting: Dietician

## 2014-07-12 ENCOUNTER — Encounter (INDEPENDENT_AMBULATORY_CARE_PROVIDER_SITE_OTHER): Payer: Self-pay

## 2014-07-12 ENCOUNTER — Ambulatory Visit
Admission: RE | Admit: 2014-07-12 | Discharge: 2014-07-12 | Disposition: A | Discharge status: Home / Self Care | Payer: BC Managed Care – PPO | Source: Ambulatory Visit

## 2014-07-12 DIAGNOSIS — Z1231 Encounter for screening mammogram for malignant neoplasm of breast: Secondary | ICD-10-CM

## 2014-08-17 ENCOUNTER — Other Ambulatory Visit: Payer: Self-pay | Admitting: Cardiovascular Disease

## 2014-08-17 NOTE — Telephone Encounter (Signed)
Rx refill sent to patient pharmacy   

## 2014-09-21 ENCOUNTER — Encounter: Payer: Self-pay | Admitting: Internal Medicine

## 2014-09-25 ENCOUNTER — Telehealth: Payer: Self-pay | Admitting: Cardiovascular Disease

## 2014-09-25 NOTE — Telephone Encounter (Signed)
Pt called in stating that she can no longer afford her Exforge  And would to see is there a generic form that she could take. Please call  Thanks

## 2014-09-25 NOTE — Telephone Encounter (Signed)
Returned a call to patient informing her that she will need to discuss a medication change with Dr. Claiborne Billings on Friday at her appointment. He will need to evaluate her blood pressure before making a decision. He  can possibly  Change the medication to the three different components but this will cause her to have 3 different prescriptions. Recommended that she wait to discuss this at the time of her appointment. Patient agrees with plan.

## 2014-09-28 ENCOUNTER — Ambulatory Visit (INDEPENDENT_AMBULATORY_CARE_PROVIDER_SITE_OTHER): Payer: BC Managed Care – PPO | Admitting: Cardiovascular Disease

## 2014-09-28 ENCOUNTER — Encounter: Payer: Self-pay | Admitting: Cardiovascular Disease

## 2014-09-28 VITALS — BP 130/82 | HR 58 | Ht 65.5 in | Wt 189.0 lb

## 2014-09-28 DIAGNOSIS — D649 Anemia, unspecified: Secondary | ICD-10-CM

## 2014-09-28 DIAGNOSIS — I10 Essential (primary) hypertension: Secondary | ICD-10-CM

## 2014-09-28 DIAGNOSIS — F411 Generalized anxiety disorder: Secondary | ICD-10-CM

## 2014-09-28 NOTE — Patient Instructions (Signed)
Your physician wants you to follow-up in: 1 year. No changes were made today in your therapy. You will receive a reminder letter in the mail two months in advance. If you don't receive a letter, please call our office to schedule the follow-up appointment.

## 2014-09-29 ENCOUNTER — Encounter: Payer: Self-pay | Admitting: Cardiovascular Disease

## 2014-09-29 DIAGNOSIS — I10 Essential (primary) hypertension: Secondary | ICD-10-CM | POA: Insufficient documentation

## 2014-09-29 NOTE — Progress Notes (Signed)
Patient ID: Heidi Herrera, female   DOB: 10/27/1966, 48 y.o.   MRN: [id number]       HPI: Heidi Herrera is a 48 year old [place] American female who presents to the office today for an 8 month follow-up cardiology evaluation.  Heidi Herrera has a 15 year history of hypertension and has been on multiple medications since that time.  Recently she has been on triple combination therapy with Exforge HCT 5/160/12.5 mg which he takes daily. She also has a history of anemia which is felt to be due to fibroids resulting in heavy bleeding intermittently. She is status post myomectomy. She also is a history of partial right lobectomy in 2009 for lung mass which was benign. She tells me approximately 6 years ago she did undergo a stress nuclear scan at [facility] which was normal.  Last fall, she had noticed episodes of sharp intermittent chest pain. She has begun to exercise at least 3 days per week at the gym. She denies any exertional precipitation to this discomfort. Sharp pain typically lasts seconds and self resolves. She states she also notes fatigability. In the past she apparently did have a sleep study and was told up not having sleep apnea probably 6 or 7 years ago. She does admit to palpitations which seem to be stress mediated rate she denies any associated presyncope or syncope.   She did undergo a [place] heart diagnostic comprehensive laboratory assessment in 2012. At that time, her total cholesterol was 221 her LDL was significantly elevated at 155 9 HDL 173 per she also had increased April lipoprotein B. Subsequent lab work has shown improvement or apparently her last blood work was done by Dr. Criss Rosales in June. At electrolytes were normal. Thyroid function studies were normal. She is anemic at 10.9 hemoglobin with hematocrit of 32.3. She has been on over-the-counter iron supplementation since that time. She tells me Dr. Criss Rosales we'll be repeating blood work in several weeks. Her cholesterol was  improved at 169 triglycerides 144 HDL 38 LDL 102.  When I initially saw her one year she was hypertensive and I changed her Exforge HCT to 10/160/12.5 which resulted in significant improvement in her blood pressure recordings.  A 2-D echo Doppler study on 01/17/2014 showed an ejection fraction of 55-60%. There was evidence for left ventricular hypertrophy with IVC measuring 17 mm and posterior wall measuring approximately 11 mm. Her left atrium was mildly dilated at 42 mm. She had normal diastolic parameters.  Subsequent blood work on 01/05/2014 showed a normal CMet, her GFR was 72 mL per minute. She continued to be anemic with a hemoglobin of 11 hematocrit 32.8. In the past she had been on iron but had not been taking this recently. Her MCV was 85.9. She has also taken B12. Her cholesterol was 174 triglycerides 58 HDL 42 LDL 120. Thyroid studies were normal.  Since I lasr saw her 8 months ago, she has lost 13 pounds purposefully.  She is sleeping better.  She is exercising frequently.  She denies palpitations.  She presents for followup evaluation.  Past Medical History  Diagnosis Date  . GERD (gastroesophageal reflux disease)   . Hypertension   . Anemia   . Obesity   . Sebaceous cyst   . Acid reflux     Past Surgical History  Procedure Laterality Date  . Lobectomy  2009  . Myomectomy  2007    Allergies  Allergen Reactions  . Bystolic [Nebivolol Hcl] Anaphylaxis, Itching and Swelling  .  Biaxin [Clarithromycin]     The category of the Biaxin family.  Also allergic to water chestnuts.    Current Outpatient Prescriptions  Medication Sig Dispense Refill  . Ascorbic Acid (VITAMIN C) 1000 MG tablet Take 2,000 mg by mouth 2 (two) times daily.       . Biotin 1000 MCG tablet Take 1,000 mcg by mouth once.      . calcium-vitamin D (OSCAL WITH D) 500-200 MG-UNIT per tablet Take 1 tablet by mouth 2 (two) times daily. Calcium 1000 mg with Magnesium 400 mg and zinc 15 mg, taking 1 capsule 2  times daily      . Cyanocobalamin (VITAMIN B 12 PO) Take 1 tablet by mouth daily.      Marland Kitchen EXFORGE HCT 10-160-12.5 MG TABS TAKE ONE TABLET BY MOUTH ONCE DAILY  30 tablet  10  . FeFum-FePoly-FA-B Cmp-C-Biot (INTEGRA PLUS) CAPS TAKE ONE CAPSULE BY MOUTH ONCE DAILY  30 capsule  5  . ibuprofen (ADVIL,MOTRIN) 800 MG tablet Take 800 mg by mouth as needed for pain.      . Multiple Vitamins-Minerals (ALIVE ONCE DAILY WOMENS 50+ PO) Take 1 tablet by mouth daily.      Heidi Herrera 3-6-9 Fatty Acids (OMEGA-3-6-9 PO) Take 1 capsule by mouth daily.      . psyllium (METAMUCIL) 58.6 % powder Take 1 packet by mouth 3 (three) times daily. Taking sugar free 2-3 times daily.       No current facility-administered medications for this visit.    Social history is notable in that she is divorced for 21 years. She has one child age 5. She has her masters degree. 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 Hypertension Mother   . Obesity Mother   . Stroke Mother   . Diabetes Mother   . Hypertension Sister   . Hyperlipidemia Sister   . Obesity Sister   . Diabetes Sister   . Heart disease Sister   . Mental illness Sister   . Hypertension Brother   . Obesity Brother   . Stroke Brother   . Mental illness Father   . Hypertension Father   . Cancer Brother   . Stroke Brother   . Diabetes Sister   . Hypertension Sister   . Mental illness Sister     ROS General: Negative; No fevers, chills, or night sweats;  HEENT: Negative; No changes in vision or hearing, sinus congestion, difficulty swallowing Pulmonary: Negative; No cough, wheezing, shortness of breath, hemoptysis Cardiovascular: Negative; No chest pain, presyncope, syncope, palpitations GI: Negative; No nausea, vomiting, diarrhea, or abdominal pain GU: Negative; No dysuria, hematuria, or difficulty voiding Musculoskeletal: Negative; no myalgias, joint pain, or  weakness Hematologic/Oncology: Negative; no easy bruising, bleeding Endocrine: Negative; no heat/cold intolerance; no diabetes Neuro: Negative; no changes in balance, headaches Skin: Negative; No rashes or skin lesions Psychiatric: Occasional anxiety and stress; No, depression Sleep: mild snoring, no daytime sleepiness, hypersomnolence, bruxism, restless legs, hypnogognic hallucinations, no cataplexy Other comprehensive 14 point system review is negative.   PE BP 130/82  Pulse 58  Ht 5' 5.5" (1.664 m)  Wt 189 lb (85.73 kg)  BMI 30.96 kg/m2  LMP 09/16/2014 Repeat blood pressure by me was markedly improved at 122/80. Repeat blood pressure 134/94 General: Alert, oriented, no distress with mild obesity Skin: normal turgor, no rashes HEENT: Normocephalic, atraumatic. Pupils round and reactive; sclera anicteric; Fundi mild vessel  tortuosity. No hemorrhages or exudates. No papilledema Nose without nasal septal hypertrophy Mouth/Parynx benign; Mallinpatti scale 3 Neck: No JVD, no carotid bruits normal carotid upstroke Lungs: clear to ausculatation and percussion; no wheezing or rales Chest: No tenderness to palpation Heart: RRR, s1 s2 normal; 1/6 systolic murmur. No S3 gallop.  No diastolic murmur.  No rubs, thrills or heaves per Abdomen: Mild central adiposity soft, nontender; no hepatosplenomehaly, BS+; abdominal aorta nontender and not dilated by palpation. Back: No CVA tenderness Pulses 2+ Extremities: no clubbinbg cyanosis or edema, Homan's sign negative  Neurologic: grossly nonfocal Psychological: Normal affect and mood  ECG (independently read by me): Sinus bradycardia 58 beats per minute.  Normal intervals.  No significant ST-T changes.   02/01/2014 ECG (independently read by me): Sinus rhythm at 68 beats per minute. QTC 455 ms.  Prior ECG of October 2014: Sinus rhythm at 79 beats per minute. Possible LVH with increased voltage in aVL. Possible left atrial  enlargement.  LABS:  BMET    Component Value Date/Time   NA 139 01/05/2014 0823   K 4.0 01/05/2014 0823   CL 104 01/05/2014 0823   CO2 28 01/05/2014 0823   GLUCOSE 82 01/05/2014 0823   BUN 10 01/05/2014 0823   CREATININE 1.05 01/05/2014 0823   CREATININE 1.0 12/18/2010 1421   CALCIUM 9.1 01/05/2014 0823   GFRNONAA 63 01/05/2014 0823   GFRNONAA 83.60 04/30/2010 0000   GFRAA 72 01/05/2014 0823   GFRAA 87 11/26/2008 1223     Hepatic Function Panel     Component Value Date/Time   PROT 7.4 01/05/2014 0823   ALBUMIN 3.6 01/05/2014 0823   AST 14 01/05/2014 0823   ALT 9 01/05/2014 0823   ALKPHOS 42 01/05/2014 0823   BILITOT 0.3 01/05/2014 0823   BILIDIR 0.1 04/30/2010 0000     CBC    Component Value Date/Time   WBC 5.0 02/01/2014 1614   RBC 4.21 02/01/2014 1614   HGB 11.8* 02/01/2014 1614   HCT 36.2 02/01/2014 1614   PLT 331 02/01/2014 1614   MCV 86.0 02/01/2014 1614   MCH 28.0 02/01/2014 1614   MCHC 32.6 02/01/2014 1614   RDW 14.7 02/01/2014 1614   LYMPHSABS 1.2 01/05/2014 0823   MONOABS 0.3 01/05/2014 0823   EOSABS 0.2 01/05/2014 0823   BASOSABS 0.0 01/05/2014 0823     BNP No results found for this basename: probnp    Lipid Panel     Component Value Date/Time   CHOL 174 01/05/2014 0823   TRIG 58 01/05/2014 0823   HDL 42 01/05/2014 0823   CHOLHDL 4.1 01/05/2014 0823   VLDL 12 01/05/2014 0823   LDLCALC 120* 01/05/2014 0823     RADIOLOGY: No results found.   ASSESSMENT AND PLAN: Ms. Cavagnaro is a  48 year old African American female with a15 year history of hypertension, who recently has been on a triple drug regimen with a Exforge HCT at 10/160/12.5 milligrams. An echo Doppler study has demonstrated normal systolic and diastolic function. She does however have asymmetric septal thickness with her septum measuring 17 mm. Her blood pressure today continues to be stable on her current medical regimen.  I commended her on her 13 pound weight loss over the past 8 months and with his weight  reduction she is sleeping better.  She has been exercising.  Her body mass index today is 30.96, which still places her in the mildly obese category.  Presently, there are no signs of edema or CHF.  She  did undergo iron studies and iron saturation was 23% and referred to level was normal at 17 when her hemoglobin was 11.8, hematocrit 36.2.  She does have mild hyperlipidemia, and has been taking omega-3 fatty acids.  Presently, she will continue with her Exforge HCT dosing and have encourage additional weight loss to get under the "obesity" threshold.  She will followup with her primary M.D., and I'll see her in one year for cardiology reevaluation.   Troy Sine, MD, [facility] 09/29/2014 12:30 PM

## 2014-10-30 ENCOUNTER — Telehealth: Payer: Self-pay | Admitting: Cardiovascular Disease

## 2014-10-30 NOTE — Telephone Encounter (Signed)
Returned call to patient she wanted to know her last cholesterol results for her insurance.Patient was told 01/05/14 lipid results.

## 2014-10-30 NOTE — Telephone Encounter (Signed)
Pt would like her last work results she had here please.

## 2014-11-30 DIAGNOSIS — Q859 Phakomatosis, unspecified: Secondary | ICD-10-CM | POA: Insufficient documentation

## 2015-02-20 ENCOUNTER — Ambulatory Visit (INDEPENDENT_AMBULATORY_CARE_PROVIDER_SITE_OTHER): Payer: BC Managed Care – PPO | Admitting: Family Medicine

## 2015-02-20 VITALS — BP 110/72 | HR 76 | Temp 98.2°F | Resp 16 | Ht 65.5 in | Wt 184.4 lb

## 2015-02-20 DIAGNOSIS — R05 Cough: Secondary | ICD-10-CM

## 2015-02-20 DIAGNOSIS — I10 Essential (primary) hypertension: Secondary | ICD-10-CM | POA: Diagnosis not present

## 2015-02-20 DIAGNOSIS — R002 Palpitations: Secondary | ICD-10-CM | POA: Diagnosis not present

## 2015-02-20 DIAGNOSIS — R062 Wheezing: Secondary | ICD-10-CM

## 2015-02-20 DIAGNOSIS — Z0000 Encounter for general adult medical examination without abnormal findings: Secondary | ICD-10-CM

## 2015-02-20 DIAGNOSIS — F411 Generalized anxiety disorder: Secondary | ICD-10-CM

## 2015-02-20 DIAGNOSIS — L659 Nonscarring hair loss, unspecified: Secondary | ICD-10-CM | POA: Diagnosis not present

## 2015-02-20 DIAGNOSIS — D509 Iron deficiency anemia, unspecified: Secondary | ICD-10-CM

## 2015-02-20 DIAGNOSIS — R059 Cough, unspecified: Secondary | ICD-10-CM

## 2015-02-20 LAB — POCT URINALYSIS DIPSTICK
Bilirubin, UA: NEGATIVE
GLUCOSE UA: NEGATIVE
Leukocytes, UA: NEGATIVE
Nitrite, UA: NEGATIVE
Protein, UA: 30
RBC UA: NEGATIVE
Spec Grav, UA: 1.025
UROBILINOGEN UA: 0.2
pH, UA: 5.5

## 2015-02-20 LAB — POCT CBC
GRANULOCYTE PERCENT: 48.9 % (ref 37–80)
HCT, POC: 41.4 % (ref 37.7–47.9)
HEMOGLOBIN: 12.7 g/dL (ref 12.2–16.2)
Lymph, poc: 1.4 (ref 0.6–3.4)
MCH: 26.9 pg — AB (ref 27–31.2)
MCHC: 30.7 g/dL — AB (ref 31.8–35.4)
MCV: 87.9 fL (ref 80–97)
MID (CBC): 0.2 (ref 0–0.9)
MPV: 7.9 fL (ref 0–99.8)
POC GRANULOCYTE: 1.5 — AB (ref 2–6.9)
POC LYMPH %: 45.3 % (ref 10–50)
POC MID %: 5.8 % (ref 0–12)
Platelet Count, POC: 257 10*3/uL (ref 142–424)
RBC: 4.71 M/uL (ref 4.04–5.48)
RDW, POC: 14.2 %
WBC: 3.1 10*3/uL — AB (ref 4.6–10.2)

## 2015-02-20 LAB — COMPREHENSIVE METABOLIC PANEL
ALBUMIN: 4.2 g/dL (ref 3.5–5.2)
ALT: 19 U/L (ref 0–35)
AST: 22 U/L (ref 0–37)
Alkaline Phosphatase: 44 U/L (ref 39–117)
BUN: 20 mg/dL (ref 6–23)
CO2: 24 mEq/L (ref 19–32)
Calcium: 9.6 mg/dL (ref 8.4–10.5)
Chloride: 98 mEq/L (ref 96–112)
Creat: 1.5 mg/dL — ABNORMAL HIGH (ref 0.50–1.10)
Glucose, Bld: 77 mg/dL (ref 70–99)
POTASSIUM: 3.5 meq/L (ref 3.5–5.3)
Sodium: 138 mEq/L (ref 135–145)
Total Bilirubin: 0.3 mg/dL (ref 0.2–1.2)
Total Protein: 8.4 g/dL — ABNORMAL HIGH (ref 6.0–8.3)

## 2015-02-20 LAB — FERRITIN: Ferritin: 94 ng/mL (ref 10–291)

## 2015-02-20 LAB — POCT GLYCOSYLATED HEMOGLOBIN (HGB A1C): Hemoglobin A1C: 5.6

## 2015-02-20 LAB — TSH: TSH: 3.099 u[IU]/mL (ref 0.350–4.500)

## 2015-02-20 MED ORDER — HYDROCODONE-HOMATROPINE 5-1.5 MG/5ML PO SYRP: 5.0000 mL | ORAL_SOLUTION | ORAL | Status: DC | PRN

## 2015-02-20 MED ORDER — ALBUTEROL SULFATE HFA 108 (90 BASE) MCG/ACT IN AERS: 2.0000 | INHALATION_SPRAY | RESPIRATORY_TRACT | Status: DC | PRN

## 2015-02-20 NOTE — Progress Notes (Addendum)
Annual physical examination  History: 49 year old lady who is here for her annual physical examination. She has no major acute complaints, but felt like it was time to get a physical. She has several minor things going on which will be in the review of systems. She has recently had a cold and cough  Past medical history: Operations: Myomectomy of uterus and lobectomy of the lung Illnesses: History of uterine fibroids, hypertension, allergies Indication allergies: Biaxin Current medications: See list Menstrual history: Still menstruates. Has heavy bleeding due to fibroids  Family history: Both parents are deceased. Father had mental illness. Mother had diabetes. She has about 9 siblings.  Social history: Single, works at [employer] and [employer]. Gets exercise but not always faithfully. She has one child and one grandchild  Review of systems: Constitutional: Unremarkable HEENT: Has some water runs were ears Cardiovascular: Aware of her heart beating strong at times. Has a history of hypertension. She gets some arm pains that she worries about. Respiratory: Mild wheezing occasionally. She has been having some nighttime cough. Gastrointestinal: Unremarkable/. She has had some GERD but it's not bothering her regularly. Genitourinary: Unremarkable except for heavy menstrual bleeding from her uterine fibroids. She still menstruates regular. Musculoskeletal: Shoulder and arms ache at times. Dermatologic: Unremarkable except she has thinning of the hair in the anterior scalp line which concerns her very much Oncologic: Was told that she had a lung cancer, had the lobectomy, and pathology showed there was no cancer present. Endocrine: Unremarkable Neurologic: Unremarkable Psychiatric: Unremarkable   Physical exam: Pleasant alert lady in no acute distress. Her hairline does seem to be seen in front for the first inch or so. The rest of the scalp is full and thick. Her TMs are normal.  No water wax in the canals. Eyes PERRLA. Throat clear. Neck supple without nodes thyromegaly. No carotid bruits. Chest is clear to all station. Heart regular without murmurs gallops or arrhythmias. Abdomen is soft. Palpable mass right low in midline abdomen consistent with her known uterine fibroids. Extremities unremarkable. Skin unremarkable.  Assessment: Physical examination Uterine fibroids History of anemia Anterior hair loss Hypertension, good control Palpitations History of mild wheezing Upper respiratory infection  Plan: Check labs and then discuss further with her.  Results for orders placed or performed in visit on 02/20/15  POCT CBC  Result Value Ref Range   WBC 3.1 (A) 4.6 - 10.2 K/uL   Lymph, poc 1.4 0.6 - 3.4   POC LYMPH PERCENT 45.3 10 - 50 %L   MID (cbc) 0.2 0 - 0.9   POC MID % 5.8 0 - 12 %M   POC Granulocyte 1.5 (A) 2 - 6.9   Granulocyte percent 48.9 37 - 80 %G   RBC 4.71 4.04 - 5.48 M/uL   Hemoglobin 12.7 12.2 - 16.2 g/dL   HCT, POC 41.4 37.7 - 47.9 %   MCV 87.9 80 - 97 fL   MCH, POC 26.9 (A) 27 - 31.2 pg   MCHC 30.7 (A) 31.8 - 35.4 g/dL   RDW, POC 14.2 %   Platelet Count, POC 257 142 - 424 K/uL   MPV 7.9 0 - 99.8 fL  POCT glycosylated hemoglobin (Hb A1C)  Result Value Ref Range   Hemoglobin A1C 5.6   POCT urinalysis dipstick  Result Value Ref Range   Color, UA yellow    Clarity, UA clear    Glucose, UA neg    Bilirubin, UA neg    Ketones, UA trace  Spec Grav, UA 1.025    Blood, UA neg    pH, UA 5.5    Protein, UA 30    Urobilinogen, UA 0.2    Nitrite, UA neg    Leukocytes, UA Negative    Reviewed her labs with her. Will get back to her regarding the remaining labs  Reassurance that she is doing very well  Urge regular exercise     Nonphysical exam related acute treatment:  Cough  Nighttime cough may be from the scarring in the lungs, but it sounds like she has a little viral cough still. Will give some Hycodan for that as well as  an inhaler to try.

## 2015-02-20 NOTE — Addendum Note (Signed)
Addended by: Jazmen Lindenbaum H on: 02/20/2015 04:38 PM   Modules accepted: Level of Service

## 2015-02-20 NOTE — Patient Instructions (Addendum)
Minoxidil over-the-counter can be used for female pattern hair loss. If you cannot find it speak to the pharmacist. Follow the instructions on the container. It needs to be used consistently to determine if it helps. If no improvement in hair growth in 4 months of use twice daily you should discontinue it.  Recommend regular exercise at least 5 days a week for 30 minutes or more..  Try to eat a little bit less by decreasing portion sizes and see if you can decrease your weight. Avoid snacks. Eat more fruits and vegetables. Avoid calorie-containing beverages.  You'll be informed of the results of your remaining lab work over the next week.  Recommend annual physical examination  You will be due for follow-up colonoscopy when you're about 49 years old probably.  The Hycodan cough syrup may cause some drowsiness, but it is good for the nighttime cough. 1 teaspoon every 4-6 hours as needed.  Use the inhaler 2 inhalations every 4-6 hours only when needed for wheezing.  Return at anytime if problems

## 2015-02-25 ENCOUNTER — Encounter: Payer: Self-pay | Admitting: Family Medicine

## 2015-03-07 ENCOUNTER — Telehealth: Payer: Self-pay | Admitting: Radiology

## 2015-03-07 NOTE — Telephone Encounter (Signed)
She received letter from you in regards to f/up with her cardiologist on increasing medication. She wanted to see if you could please just go ahead and do that for her instead.  Also, the mention of an increase on one of her lab results, she wanted to know if that could be due to her taking cough syrup at the time?

## 2015-03-12 MED ORDER — AMLODIPINE BESYLATE-VALSARTAN 10-160 MG PO TABS: 1.0000 | ORAL_TABLET | Freq: Every day | ORAL | Status: DC

## 2015-03-12 NOTE — Telephone Encounter (Signed)
Spoke with pt, advised messgae from Dr. Linna Darner. Pt understood and Rx sent in.

## 2015-03-12 NOTE — Telephone Encounter (Signed)
Patient returned call

## 2015-03-12 NOTE — Telephone Encounter (Signed)
Left message for pt to call back  °

## 2015-03-12 NOTE — Telephone Encounter (Signed)
Call:  Discontinue exforge /hct and change to plain exforge  Rx: Exforge10/160  #30  One daily,   RF x 3  Return in 2-3 months to recheck blood pressure and labs.

## 2015-04-09 ENCOUNTER — Other Ambulatory Visit: Payer: Self-pay | Admitting: Obstetrics and Gynecology

## 2015-04-09 DIAGNOSIS — D259 Leiomyoma of uterus, unspecified: Secondary | ICD-10-CM

## 2015-04-24 ENCOUNTER — Ambulatory Visit
Admission: RE | Admit: 2015-04-24 | Discharge: 2015-04-24 | Disposition: A | Discharge status: Home / Self Care | Payer: BC Managed Care – PPO | Source: Ambulatory Visit | Attending: Obstetrics and Gynecology | Admitting: Obstetrics and Gynecology

## 2015-04-24 DIAGNOSIS — D259 Leiomyoma of uterus, unspecified: Secondary | ICD-10-CM | POA: Insufficient documentation

## 2015-04-24 NOTE — Consult Note (Signed)
Chief Complaint: Chief Complaint  Patient presents with  . Advice Only    Consult for [place]   Referring Physician(s): Haygood,Vanessa P  History of Present Illness: Heidi Herrera is a 49 y.o. (G1, P41) female with past medical history significant for hypertension, who presents to the interventional radiology clinic for evaluation of symptomatic uterine fibroids. The patient is unaccompanied and serves as her own historian.  The patient was initially diagnosed with fibroids prior to 2007 at which time she underwent a myomectomy which (per patient report) resulted in the surgical removal of 20 uterine fibroids. Since that time, the patient has experienced severe menorrhagia though this has worsened recently. She states that her cycle has remained regular occurring every 28 days with her cycle lasting approximately 5 days, 2 days of which are associated with severe menorrhagia including the passage of clots and necessitating the changing of tampons every 2 hours. The patient has never had a blood transfusion. The patient had been an iron supplementation until recently for anemia. Patient denies intermittent bleeding or spotting.    Patient does admit to mood swings which she attributes to a peri-menopausal state though again, her cycle has remained regular. No hot flashes.  Patient admits to some bulk symptoms including frequent urination, nocturia as well as abdominal bloating and constipation, worse at the time of her menstrual cycle.   The patient is not interested in undergoing another surgical procedure. Patient had negative Pap smear on 04/05/2014. She has never undergone an endometrial biopsy.  Past Medical History  Diagnosis Date  . GERD (gastroesophageal reflux disease)   . Hypertension   . Anemia   . Obesity   . Sebaceous cyst   . Acid reflux   . Allergy   . Anxiety     Past Surgical History  Procedure Laterality Date  . Lobectomy  2009  . Myomectomy  2007     Allergies: Bystolic and Biaxin  Medications: Prior to Admission medications   Medication Sig Start Date End Date Taking? Authorizing Provider  albuterol (PROVENTIL HFA;VENTOLIN HFA) 108 (90 BASE) MCG/ACT inhaler Inhale 2 puffs into the lungs every 4 (four) hours as needed for wheezing or shortness of breath (cough, shortness of breath or wheezing.). 02/20/15  Yes Posey Boyer, MD  amLODipine-valsartan (EXFORGE) 10-160 MG per tablet Take 1 tablet by mouth daily. 03/12/15  Yes Posey Boyer, MD  Ascorbic Acid (VITAMIN C) 1000 MG tablet Take 2,000 mg by mouth 2 (two) times daily.    Yes Historical Provider, MD  Biotin 1000 MCG tablet Take 1,000 mcg by mouth once.   Yes Historical Provider, MD  calcium-vitamin D (OSCAL WITH D) 500-200 MG-UNIT per tablet Take 1 tablet by mouth 2 (two) times daily. Calcium 1000 mg with Magnesium 400 mg and zinc 15 mg, taking 1 capsule 2 times daily   Yes Historical Provider, MD  co-enzyme Q-10 30 MG capsule Take 200 mg by mouth daily.   Yes Historical Provider, MD  Cyanocobalamin (VITAMIN B 12 PO) Take 1 tablet by mouth daily.   Yes Historical Provider, MD  folic acid (FOLVITE) 1 MG tablet Take 1 mg by mouth daily. Takes 2 tabs once/day   Yes Historical Provider, MD  ibuprofen (ADVIL,MOTRIN) 800 MG tablet Take 800 mg by mouth as needed for pain.   Yes Historical Provider, MD  Inositol 500 MG TABS Take 1 tablet by mouth. Takes 4 tabs once/daily   Yes Historical Provider, MD  Multiple Vitamins-Minerals (ALIVE ONCE  DAILY WOMENS 50+ PO) Take 1 tablet by mouth daily.   Yes Historical Provider, MD  psyllium (METAMUCIL) 58.6 % powder Take 1 packet by mouth 3 (three) times daily. Taking sugar free 2-3 times daily.   Yes Historical Provider, MD  tranexamic acid (LYSTEDA) 650 MG TABS tablet Take 1,300 mg by mouth 3 (three) times daily. Takes 2 tabs 3 times daily x 2 days during heavy menstrual flow.   Yes Historical Provider, MD  EXFORGE HCT 10-160-12.5 MG TABS TAKE ONE  TABLET BY MOUTH ONCE DAILY Patient not taking: Reported on 04/24/2015    Troy Sine, MD  FeFum-FePoly-FA-B Cmp-C-Biot (INTEGRA PLUS) CAPS TAKE ONE CAPSULE BY MOUTH ONCE DAILY Patient not taking: Reported on 04/24/2015 08/17/14   Troy Sine, MD  HYDROcodone-homatropine [facility]) 5-1.5 MG/5ML syrup Take 5 mLs by mouth every 4 (four) hours as needed. Patient not taking: Reported on 04/24/2015 02/20/15   Posey Boyer, MD  Omega 3-6-9 Fatty Acids (OMEGA-3-6-9 PO) Take 1 capsule by mouth daily.    Historical Provider, MD     Family History  Problem Relation Age of Onset  . Hypertension Mother   . Obesity Mother   . Stroke Mother   . Diabetes Mother   . Hypertension Sister   . Hyperlipidemia Sister   . Obesity Sister   . Diabetes Sister   . Heart disease Sister   . Mental illness Sister   . Hypertension Brother   . Obesity Brother   . Stroke Brother   . Mental illness Father   . Hypertension Father   . Cancer Brother   . Stroke Brother   . Diabetes Sister   . Hypertension Sister   . Mental illness Sister     History   Social History  . Marital Status: Divorced    Spouse Name: N/A  . Number of Children: N/A  . Years of Education: N/A   Social History Main Topics  . Smoking status: Never Smoker   . Smokeless tobacco: Never Used  . Alcohol Use: Yes  . Drug Use: No  . Sexual Activity: Not on file   Other Topics Concern  . Not on file   Social History Narrative   ECOG Status: 0 - Asymptomatic  Review of Systems: A 12 point ROS discussed and pertinent positives are indicated in the HPI above.  All other systems are negative.  Review of Systems  Constitutional: Positive for fatigue.       Pt reports her fatigue is worse at the time of her menses.  HENT: Negative.   Eyes: Negative.   Respiratory: Negative.   Cardiovascular: Positive for leg swelling.       Patient reports mild bilateral lower extremity swelling by the end of the day though this improves during the  night.  Gastrointestinal: Positive for constipation and abdominal distention.  Genitourinary: Positive for urgency, frequency and menstrual problem. Negative for dysuria.  Skin: Negative.   Allergic/Immunologic: Positive for environmental allergies.  Neurological: Negative.   Hematological: Negative.   Psychiatric/Behavioral: Negative.     Vital Signs: Ht 5\' 5"  (1.651 m)  Wt 196 lb (88.905 kg)  BMI 32.62 kg/m2  LMP 04/02/2015  Physical Exam  Constitutional: She appears well-developed and well-nourished.  HENT:  Head: Normocephalic and atraumatic.  Cardiovascular: Normal rate, regular rhythm and intact distal pulses.   Easily palpable right common femoral and dorsalis pedis artery pulses. I am unable to palpate the right dorsalis pedis artery.  Pulmonary/Chest: Effort normal and  breath sounds normal.  Abdominal: Soft. She exhibits mass. She exhibits no distension. There is no tenderness.  I am able to palpate the uterine fundus at the level of the umbilicus within the right mid/lower abdomen.  Skin: Skin is warm and dry.  Psychiatric: She has a normal mood and affect. Her behavior is normal.  Nursing note and vitals reviewed.   Imaging:  Mention is made of an intraoffice pelvic ultrasound however the report is not available at the time of this dictation.  Labs:  CBC:  Recent Labs  02/20/15 1618  WBC 3.1*  HGB 12.7  HCT 41.4    COAGS: No results for input(s): INR, APTT in the last 8760 hours.  BMP:  Recent Labs  02/20/15 1608  NA 138  K 3.5  CL 98  CO2 24  GLUCOSE 77  BUN 20  CALCIUM 9.6  CREATININE 1.50*    LIVER FUNCTION TESTS:  Recent Labs  02/20/15 1608  BILITOT 0.3  AST 22  ALT 19  ALKPHOS 44  PROT 8.4*  ALBUMIN 4.2   Assessment and Plan:  JASELLE PRYER is a 49 y.o. (G1, P66) female with past medical history significant for hypertension, who presents to the interventional radiology clinic for evaluation of symptomatic uterine fibroids.    While the patient does admit to some bulk symptoms, including frequent urination and nocturia as well as abdominal bloating and constipation, all of which are worse at the time of her menstrual cycle, though the patient's primarily fibroid related complaint deals with severe menorrhagia which at the worst time of her cycle is associated with the passage of clots and changing of tampons every 2 hours.  The patient admits to mood swings but otherwise denies perimenopausal symptoms. Her cycle has remained regular. No hot flashes.  Prolonged discussions were held with the patient regarding potential management options of her uterine fibroids including conservative treatment and hysterectomy. The patient wishes to avoid undergoing an additional surgical procedure and as such, prolonged discussions were held with the patient regarding the benefits and risks (including but not limited to bleeding, vessel injury, infection, nontarget embolization, contrast and radiation exposure) of uterine fibroid embolization. Following this conversation, the patient wishes to contact her insurance company to find out the amount of her deductible.  If the patient ultimately decides to pursue uterine fibroid embolization, I will first obtain a contrast enhanced pelvic MRI to better demonstrate both the burden and enhancement characteristics of the patient's fibroids. I will also obtain an endometrial biopsy.  Ultimately, the uterine fibroid embolization may be performed at [facility] and will entail an overnight admission for observation and PCA usage.  The patient will contact the interventional radiology clinic following conversation with her insurance company regarding the amount of her deductible.  Additionally, the patient was encouraged to call the interventional radiology clinic with any interval questions or concerns..  Thank you for this interesting consult.  I greatly enjoyed meeting Heidi Herrera and  look forward to participating in her care.  SignedSandi Mariscal 04/24/2015, 1:57 PM   I spent a total of 30 Minutes in face to face in clinical consultation, greater than 50% of which was counseling/coordinating care for symptomatic uterine fibroids.

## 2015-07-06 ENCOUNTER — Other Ambulatory Visit: Payer: Self-pay | Admitting: Family Medicine

## 2015-07-10 ENCOUNTER — Other Ambulatory Visit: Payer: Self-pay | Admitting: Obstetrics and Gynecology

## 2015-07-10 DIAGNOSIS — Z1231 Encounter for screening mammogram for malignant neoplasm of breast: Secondary | ICD-10-CM

## 2015-08-13 ENCOUNTER — Ambulatory Visit (INDEPENDENT_AMBULATORY_CARE_PROVIDER_SITE_OTHER): Payer: BC Managed Care – PPO | Admitting: Family Medicine

## 2015-08-13 VITALS — BP 128/86 | HR 67 | Temp 98.2°F | Resp 18 | Ht 66.0 in | Wt 199.0 lb

## 2015-08-13 DIAGNOSIS — D259 Leiomyoma of uterus, unspecified: Secondary | ICD-10-CM

## 2015-08-13 DIAGNOSIS — R29818 Other symptoms and signs involving the nervous system: Secondary | ICD-10-CM

## 2015-08-13 DIAGNOSIS — I10 Essential (primary) hypertension: Secondary | ICD-10-CM | POA: Diagnosis not present

## 2015-08-13 DIAGNOSIS — R2689 Other abnormalities of gait and mobility: Secondary | ICD-10-CM

## 2015-08-13 DIAGNOSIS — R002 Palpitations: Secondary | ICD-10-CM | POA: Diagnosis not present

## 2015-08-13 DIAGNOSIS — D649 Anemia, unspecified: Secondary | ICD-10-CM

## 2015-08-13 LAB — POCT CBC
Granulocyte percent: 55.2 %G (ref 37–80)
HCT, POC: 36.8 % — AB (ref 37.7–47.9)
Hemoglobin: 11.6 g/dL — AB (ref 12.2–16.2)
LYMPH, POC: 1.6 (ref 0.6–3.4)
MCH: 26.8 pg — AB (ref 27–31.2)
MCHC: 31.4 g/dL — AB (ref 31.8–35.4)
MCV: 85.4 fL (ref 80–97)
MID (cbc): 0.2 (ref 0–0.9)
MPV: 7.8 fL (ref 0–99.8)
PLATELET COUNT, POC: 244 10*3/uL (ref 142–424)
POC Granulocyte: 2.3 (ref 2–6.9)
POC LYMPH PERCENT: 39.3 %L (ref 10–50)
POC MID %: 5.5 %M (ref 0–12)
RBC: 4.31 M/uL (ref 4.04–5.48)
RDW, POC: 15.5 %
WBC: 4.1 10*3/uL — AB (ref 4.6–10.2)

## 2015-08-13 MED ORDER — AMLODIPINE BESYLATE-VALSARTAN 10-160 MG PO TABS: 1.0000 | ORAL_TABLET | Freq: Every day | ORAL | Status: DC

## 2015-08-13 NOTE — Patient Instructions (Signed)
Continue your current iron  Continue the Exforge without fluid pill daily  Referral is being made to Dr. Wynonia Lawman, cardiologist, to assess heart rhythm  If the balance problem is worsening let me know and we will refer he to neurology  Continue to try and get regular exercise  Discussed with your gynecologist the treatment options for the uterine fibroids  Plan to return to see me in about 6 months

## 2015-08-13 NOTE — Progress Notes (Signed)
Multiple concerns Subjective:  Patient ID: Heidi Herrera, female    DOB: 1966/05/01  Age: 49 y.o. MRN: [id number]  Patient is here for a number of things. She continues to be anemic. She saw the dermatologist about the alopecia and her opinion was that the iron deficiency anemia was a factor. She continued her on an iron preparation. She is also using the minoxidil now.  She has some balance problems when she gets up in the mornings. She worries because her mother had that before she deteriorated. It sounds like she is just stiff and not balancing well, but I offered to refer her to neurology. She preferred to wait until she gets her heart checked out.  She has palpitations especially when she is laying in bed in the morning she is aware of her heart beating hard. Also she climbs a flight of stairs she feels that. She has some regular exercise, it is the stepper that bothers her. She has been to a cardiologist 2 years ago and echocardiogram was normal essentially. She would like to see another cardiologist.  She takes the blood pressure medications regular. We removed the hydrochlorothiazide from it and she has done better though she still has a little bit of ankle edema in the hot weather this summer.  She does realize she gets a little anxious about these things.  Results for orders placed or performed in visit on 08/13/15  POCT CBC  Result Value Ref Range   WBC 4.1 (A) 4.6 - 10.2 K/uL   Lymph, poc 1.6 0.6 - 3.4   POC LYMPH PERCENT 39.3 10 - 50 %L   MID (cbc) 0.2 0 - 0.9   POC MID % 5.5 0 - 12 %M   POC Granulocyte 2.3 2 - 6.9   Granulocyte percent 55.2 37 - 80 %G   RBC 4.31 4.04 - 5.48 M/uL   Hemoglobin 11.6 (A) 12.2 - 16.2 g/dL   HCT, POC 36.8 (A) 37.7 - 47.9 %   MCV 85.4 80 - 97 fL   MCH, POC 26.8 (A) 27 - 31.2 pg   MCHC 31.4 (A) 31.8 - 35.4 g/dL   RDW, POC 15.5 %   Platelet Count, POC 244 142 - 424 K/uL   MPV 7.8 0 - 99.8 fL      Objective:   Pleasant lady, overweight, no  major distress. Throat clear. Neck supple without nodes or thyromegaly. No carotid bruits. Chest is clear to all station. Heart regular without murmurs. Abdomen soft. She has a uterine fibroid about the size of a 20 week uterus. Slightly more on the right than the left. Extremities have 1-2+ edema.  Assessment & Plan:   Assessment:  Palpitations Balance problems Hypertension controlled Uterine fibroid, large Iron deficiency anemia secondary to vaginal bleeding. She is still premenopausal. Ankle edema  Plan:  Continue same medications Check CBC Talk with her OB/GYN about the uterus Refer to cardiology Dr. Tollie Eth.  Patient Instructions  Continue your current iron  Continue the Exforge without fluid pill daily  Referral is being made to Dr. Wynonia Lawman, cardiologist, to assess heart rhythm  If the balance problem is worsening let me know and we will refer he to neurology  Continue to try and get regular exercise  Discussed with your gynecologist the treatment options for the uterine fibroids  Plan to return to see me in about 6 months    HOPPER,DAVID, MD 08/13/2015

## 2015-08-15 ENCOUNTER — Ambulatory Visit
Admission: RE | Admit: 2015-08-15 | Discharge: 2015-08-15 | Disposition: A | Discharge status: Home / Self Care | Payer: BC Managed Care – PPO | Source: Ambulatory Visit | Attending: Obstetrics and Gynecology | Admitting: Obstetrics and Gynecology

## 2015-08-15 DIAGNOSIS — Z1231 Encounter for screening mammogram for malignant neoplasm of breast: Secondary | ICD-10-CM

## 2015-09-04 ENCOUNTER — Encounter: Payer: Self-pay | Admitting: Cardiology

## 2015-09-04 NOTE — Progress Notes (Signed)
Patient ID: Heidi Herrera, female   DOB: 1966-01-11, 49 y.o.   MRN: [id number]   Heidi Herrera  Date of visit:  09/04/2015 DOB:  [birth year]    Age:  49 yrs. Medical record number:  [id number]     Account number:  [id number] Primary Care Provider: IRWERX,VQMGQ ____________________________ CURRENT DIAGNOSES  1. Palpitations  2. Hypertensive heart disease without heart failure  3. Obesity ____________________________ ALLERGIES  Biaxin, Intolerance-unknown  Bystolic, Anaphylaxis ____________________________ MEDICATIONS  1. albuterol sulfate 90 mcg/actuation breath activated powder inhaler, PRN  2. Exforge 10 mg-160 mg tablet, 1 p.o. daily  3. Vitamin C 1,000 mg tablet, 2 bid  4. biotin 1,000 mcg chewable tablet, 1 p.o. daily  5. Calcium 500 + D 500 mg (1,250 mg)-200 unit tablet, BID  6. CoQ-10 30 mg capsule, 200mg  qd  7. cyanocobalamin (vit B-12) ER 1,000 mcg tablet,extended release, 1 p.o. daily  8. multivitamin tablet, 1 p.o. daily  9. Metamucil oral powder, TID  10. aspirin 81 mg chewable tablet, 1 p.o. daily  11. Delsym Cough-Chest Congestion DM 5 mg-100 mg/5 mL oral liquid, PRN ____________________________ CHIEF COMPLAINTS  Palpitations ____________________________ HISTORY OF PRESENT ILLNESS This very nice 49 year old black female is seen for evaluation of palpitations. The patient has a prior history of hypertension as well as obesity. She has been evaluated by couple of cardiologists in the past for an enlarged heart and was demonstrated to have asymmetric septal hypertrophy of the LV. She exercises on a regular basis and denies angina. She has been somewhat anemic and also complains of her loss. She denies angina and has no PND, orthopnea or claudication.  She recently experienced episodic skips and fluttering of her heart that were be more noticeable when she was at rest worse she turned over on her left side. She has not had dizziness or syncope associated with them. These have  increased in frequency and severity and she wishes to know what was going on and what this was all about. Her HCTZ component was discontinued from her blood pressure medicines and she has remained normotensive. She feels as if the palpitations have improved somewhat since this was done. ____________________________ PAST HISTORY  Past Medical Illnesses:  anemia, obesity, hypertension;  Cardiovascular Illnesses:  palpitations;  Surgical Procedures:  lobectomy, myomectomy for fibroids;  NYHA Classification:  I;  Canadian Angina Classification:  Class 0: Asymptomatic;  Cardiology Procedures-Invasive:  no history of prior cardiac procedures;  Cardiology Procedures-Noninvasive:  echocardiogram;  LVEF not documented,   ____________________________ CARDIO-PULMONARY TEST DATES EKG Date:  09/04/2015;   ____________________________ FAMILY HISTORY Brother -- Brother alive with problem, Cancer Brother -- Brother alive and well Brother -- Brother alive and well Brother -- Brother alive and well Brother -- Brother alive with problem, Hypertension, Diabetes mellitus Father -- Father dead, Death of unknown cause Mother -- Mother dead, Diabetes mellitus, CVA, Hypertension Sister -- Sister alive with problem, Diabetes mellitus, Hypertension Sister -- Sister alive and well Sister -- Sister alive with problem, Hypertension Sister -- Sister alive with problem, Hypertension, Diabetes mellitus ____________________________ SOCIAL HISTORY Alcohol Use:  socially;  Smoking:  never smoked;  Diet:  low sodium (less than 2 grams);  Lifestyle:  divorced;  Exercise:  treadmill for approximately 45 minutes 3 days per week and weight lifting for approximately 45 minutes 3 days per week;  Occupation:  [employer] work;  Residence:  lives alone;   ____________________________ REVIEW OF SYSTEMS General:  obesity  Integumentary:seborrheic dermatitis Eyes: wears eye glasses/contact lenses, denies  diplopia, glaucoma or visual field  defects. Ears, Nose, Throat, Mouth:  denies any hearing loss, epistaxis, hoarseness or difficulty speaking. Respiratory: denies dyspnea, cough, wheezing or hemoptysis. Cardiovascular:  please review HPI Abdominal: denies dyspepsia, GI bleeding, constipation, or diarrheaGenitourinary-Female: no dysuria, urgency, frequency, UTIs, or stress incontinence Musculoskeletal:  denies arthritis, venous insufficiency, or muscle weakness Neurological:  headaches Psychiatric:  situational stress Hematological/Immunologic:  denies any food allergies, bleeding disorders. ____________________________ PHYSICAL EXAMINATION VITAL SIGNS  Blood Pressure:  134/80 Sitting, Right arm, regular cuff  , 126/76 Standing, Right arm and regular cuff   Pulse:  84/min. Weight:  198.00 lbs. Height:  66"BMI: 32  Constitutional:  pleasant African American female in no acute distress, mildly obese Skin:  warm and dry to touch, no apparent skin lesions, or masses noted. Head:  normocephalic, normal hair pattern, no masses or tenderness Eyes:  EOMS Intact, PERRLA, C and S clear, Funduscopic exam not done. ENT:  ears, nose and throat reveal no gross abnormalities.  Dentition good. Neck:  supple, without massess. No JVD, thyromegaly or carotid bruits. Carotid upstroke normal. Chest:  normal symmetry, clear to auscultation. Cardiac:  regular rhythm, normal S1 and S2, No S3 or S4, no murmurs, gallops or rubs detected. Abdomen:  abdomen soft,non-tender, no masses, no hepatospenomegaly, or aneurysm noted Peripheral Pulses:  the femoral,dorsalis pedis, and posterior tibial pulses are full and equal bilaterally with no bruits auscultated. Extremities & Back:  no deformities, clubbing, cyanosis, erythema or edema observed. Normal muscle strength and tone. Neurological:  no gross motor or sensory deficits noted, affect appropriate, oriented x3. ____________________________ IMPRESSIONS/PLAN  1. Episodic palpitations with an EKG showing LVH  today 2. 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 Obesity with need to lose weight  Recommendations:  Wear cardiovascular event monitor to determine what the palpitations are due to. Recent lab work reviewed. Blood pressure under good control. Advised to restrict calories and lose weight.  ____________________________ TODAYS ORDERS  1. 12 Lead EKG: Today  2. [place]: Today  3. Return Visit: 1 month                       ____________________________ Cardiology Physician:  Kerry Hough MD [facility]

## 2015-10-10 ENCOUNTER — Encounter: Payer: Self-pay | Admitting: Cardiology

## 2015-10-10 NOTE — Progress Notes (Signed)
Patient ID: Heidi Herrera, female   DOB: [birth year], 49 y.o.   MRN: [id number]   Heidi Herrera  Date of visit:  10/10/2015 DOB:  07-02-1966    Age:  49 yrs. Medical record number:  [id number]     Account number:  [id number] Primary Care Provider: JQBHAL,PFXTK ____________________________ CURRENT DIAGNOSES  1. PVC's  2. Hypertensive heart disease without heart failure  3. Obesity ____________________________ ALLERGIES  Biaxin, Intolerance-unknown  Bystolic, Anaphylaxis ____________________________ MEDICATIONS  1. albuterol sulfate 90 mcg/actuation breath activated powder inhaler, PRN  2. Exforge 10 mg-160 mg tablet, 1 p.o. daily  3. Vitamin C 1,000 mg tablet, 2 bid  4. biotin 1,000 mcg chewable tablet, 1 p.o. daily  5. Calcium 500 + D 500 mg (1,250 mg)-200 unit tablet, BID  6. CoQ-10 30 mg capsule, 200mg  qd  7. cyanocobalamin (vit B-12) ER 1,000 mcg tablet,extended release, 1 p.o. daily  8. multivitamin tablet, 1 p.o. daily  9. Metamucil oral powder, TID  10. aspirin 81 mg chewable tablet, 1 p.o. daily  11. Delsym Cough-Chest Congestion DM 5 mg-100 mg/5 mL oral liquid, PRN ____________________________ CHIEF COMPLAINTS  Followup of Palpitations ____________________________ HISTORY OF PRESENT ILLNESS Patient seen for cardiac followup. She has cardiac event monitor showed occasional PACs and correspond to complaints of heart fluttering. She also has episodes of some sinus tachycardia and at one point took her monitor to the gym and had some tachycardia. She doesn't have angina. She denies PND, orthopnea or claudication.  ____________________________ PAST HISTORY  Past Medical Illnesses:  anemia, obesity, hypertension;  Cardiovascular Illnesses:  arrhythmia-PVCs;  Surgical Procedures:  lobectomy, myomectomy for fibroids;  NYHA Classification:  I;  Canadian Angina Classification:  Class 0: Asymptomatic;  Cardiology Procedures-Invasive:  no history of prior cardiac procedures;  Cardiology  Procedures-Noninvasive:  echocardiogram;  LVEF not documented,   ____________________________ CARDIO-PULMONARY TEST DATES EKG Date:  09/04/2015;   ____________________________ SOCIAL HISTORY Alcohol Use:  socially;  Smoking:  never smoked;  Diet:  low sodium (less than 2 grams);  Lifestyle:  divorced;  Exercise:  treadmill for approximately 45 minutes 3 days per week and weight lifting for approximately 45 minutes 3 days per week;  Occupation:  [employer] work;  Residence:  lives alone;   ____________________________ PHYSICAL EXAMINATION VITAL SIGNS  Blood Pressure:  124/70 Sitting, Right arm, regular cuff  , 122/70 Standing, Right arm and regular cuff   Pulse:  80/min. Weight:  200.00 lbs. Height:  66"BMI: 32  Constitutional:  pleasant African American female in no acute distress, mildly obese Chest:  normal symmetry, clear to auscultation. Cardiac:  regular rhythm, normal S1 and S2, No S3 or S4, no murmurs, gallops or rubs detected. Abdomen:  abdomen soft,non-tender, no masses, no hepatospenomegaly, or aneurysm noted ____________________________ IMPRESSIONS/PLAN  1. Palpitations that corresponds to PVCs on monitoring in terms of skipping 2. Hypertensive heart disease 3. Obesity  Recommendations:  Event monitor did not show any severe arrhythmias. I recommended that she continue her blood pressure treatment and followup on an as-needed basis. I would be happy to see her if the need arises in the future. Discussed importance of weight reduction. ____________________________ TODAYS ORDERS  1. Return: prn                       ____________________________ Cardiology Physician:  Kerry Hough MD [facility]

## 2016-02-04 ENCOUNTER — Other Ambulatory Visit: Payer: Self-pay | Admitting: Obstetrics and Gynecology

## 2016-02-13 ENCOUNTER — Ambulatory Visit (INDEPENDENT_AMBULATORY_CARE_PROVIDER_SITE_OTHER): Payer: BC Managed Care – PPO | Admitting: Family Medicine

## 2016-02-13 VITALS — BP 160/88 | HR 91 | Temp 98.7°F | Resp 16 | Ht 66.5 in | Wt 195.0 lb

## 2016-02-13 DIAGNOSIS — I10 Essential (primary) hypertension: Secondary | ICD-10-CM

## 2016-02-13 DIAGNOSIS — J302 Other seasonal allergic rhinitis: Secondary | ICD-10-CM

## 2016-02-13 DIAGNOSIS — R609 Edema, unspecified: Secondary | ICD-10-CM

## 2016-02-13 DIAGNOSIS — J029 Acute pharyngitis, unspecified: Secondary | ICD-10-CM

## 2016-02-13 MED ORDER — CETIRIZINE HCL 10 MG PO TABS: 10.0000 mg | ORAL_TABLET | Freq: Every day | ORAL | Status: DC

## 2016-02-13 MED ORDER — AMLODIPINE BESYLATE 5 MG PO TABS: 5.0000 mg | ORAL_TABLET | Freq: Every day | ORAL | Status: DC

## 2016-02-13 MED ORDER — VALSARTAN-HYDROCHLOROTHIAZIDE 160-12.5 MG PO TABS: 1.0000 | ORAL_TABLET | Freq: Every day | ORAL | Status: DC

## 2016-02-13 NOTE — Patient Instructions (Addendum)
Stop taking your current blood pressure medication  Begin taking amlodipine 5 mg at bedtime and valsartan HCT 160/12.5 one every morning.  If the swelling is not improving over the next 10 days or 2 weeks please let me know.  Monitor your blood pressure at home a couple times a week  Plan to return in about 6 weeks for a recheck. Please call to find out when I will be here before you come in.  Because you received labwork today, you will receive an invoice from [employer]. Please contact [facility] at [phone] with questions or concerns regarding your invoice. Our billing staff will not be able to assist you with those questions.  You will be contacted with the lab results as soon as they are available. The fastest way to get your results is to activate your My Chart account. Instructions are located on the last page of this paperwork. If you have not heard from [place] regarding the results in 2 weeks, please contact this office.

## 2016-02-13 NOTE — Progress Notes (Signed)
Patient ID: Heidi Herrera, female    DOB: 04/09/1966  Age: 50 y.o. MRN: [id number]  Chief Complaint  Patient presents with  . Follow-up    HBP     Subjective:   Patient is here for a follow-up on her blood pressure. It is doing fairly well, but she has been having trouble with swelling in her feet. She has not seen Dr. Wynonia Lawman for while. She is does not have any headaches or dizziness or chest pains. She has been having seasonal allergies fairly badly recently. She would like to have some Zyrtec prescribed because of her insurance card. Otherwise she feels fairly well. Works regularly at [employer] job with her feet down.  Current allergies, medications, problem list, past/family and social histories reviewed.  Objective:  BP 160/88 mmHg  Pulse 91  Temp(Src) 98.7 F (37.1 C) (Oral)  Resp 16  Ht 5' 6.5" (1.689 m)  Wt 195 lb (88.451 kg)  BMI 31.01 kg/m2  SpO2 98%  LMP 02/05/2016  Chest clear. Heart regular with a very soft systolic ejection murmur. She has 2-3+ pitting edema of the feet, but this is at the end of a long day. Throat was clear. She's been having a little sore throat and I told her that's probably from allergies.  Assessment & Plan:   Assessment: 1. Essential hypertension   2. Edema, unspecified type   3. Seasonal allergies   4. Sore throat       Plan: Change of blood pressure medications as ordered below. Return in about 6 weeks for a follow-up.  Orders Placed This Encounter  Procedures  . COMPLETE METABOLIC PANEL WITH GFR    Meds ordered this encounter  Medications  . valsartan-hydrochlorothiazide (DIOVAN HCT) 160-12.5 MG tablet    Sig: Take 1 tablet by mouth daily.    Dispense:  90 tablet    Refill:  1  . amLODipine (NORVASC) 5 MG tablet    Sig: Take 1 tablet (5 mg total) by mouth daily.    Dispense:  90 tablet    Refill:  1  . cetirizine (ZYRTEC) 10 MG tablet    Sig: Take 1 tablet (10 mg total) by mouth daily.    Dispense:  90 tablet    Refill:   3         Patient Instructions  Stop taking your current blood pressure medication  Begin taking amlodipine 5 mg at bedtime and valsartan HCT 160/12.5 one every morning.  If the swelling is not improving over the next 10 days or 2 weeks please let me know.  Monitor your blood pressure at home a couple times a week  Plan to return in about 6 weeks for a recheck. Please call to find out when I will be here before you come in.  Because you received labwork today, you will receive an invoice from [employer]. Please contact [facility] at [phone] with questions or concerns regarding your invoice. Our billing staff will not be able to assist you with those questions.  You will be contacted with the lab results as soon as they are available. The fastest way to get your results is to activate your My Chart account. Instructions are located on the last page of this paperwork. If you have not heard from [place] regarding the results in 2 weeks, please contact this office.      Return in about 6 weeks (around 03/26/2016).   Patrice Matthew, MD 02/13/2016

## 2016-02-14 LAB — COMPLETE METABOLIC PANEL WITH GFR
ALT: 9 U/L (ref 6–29)
AST: 14 U/L (ref 10–35)
Albumin: 4.1 g/dL (ref 3.6–5.1)
Alkaline Phosphatase: 48 U/L (ref 33–130)
BUN: 17 mg/dL (ref 7–25)
CALCIUM: 9.3 mg/dL (ref 8.6–10.4)
CHLORIDE: 105 mmol/L (ref 98–110)
CO2: 27 mmol/L (ref 20–31)
Creat: 1.27 mg/dL — ABNORMAL HIGH (ref 0.50–1.05)
GFR, Est African American: 56 mL/min — ABNORMAL LOW (ref >=60)
GFR, Est Non African American: 49 mL/min — ABNORMAL LOW (ref >=60)
Glucose, Bld: 80 mg/dL (ref 65–99)
POTASSIUM: 4 mmol/L (ref 3.5–5.3)
Sodium: 140 mmol/L (ref 135–146)
Total Bilirubin: 0.3 mg/dL (ref 0.2–1.2)
Total Protein: 7.7 g/dL (ref 6.1–8.1)

## 2016-02-15 ENCOUNTER — Encounter: Payer: Self-pay | Admitting: Family Medicine

## 2016-04-04 ENCOUNTER — Ambulatory Visit (INDEPENDENT_AMBULATORY_CARE_PROVIDER_SITE_OTHER): Payer: BC Managed Care – PPO | Admitting: Family Medicine

## 2016-04-04 VITALS — BP 130/82 | HR 66 | Temp 98.2°F | Resp 14 | Ht 66.0 in | Wt 193.0 lb

## 2016-04-04 DIAGNOSIS — R609 Edema, unspecified: Secondary | ICD-10-CM | POA: Diagnosis not present

## 2016-04-04 DIAGNOSIS — R748 Abnormal levels of other serum enzymes: Secondary | ICD-10-CM

## 2016-04-04 DIAGNOSIS — R430 Anosmia: Secondary | ICD-10-CM

## 2016-04-04 DIAGNOSIS — I10 Essential (primary) hypertension: Secondary | ICD-10-CM | POA: Diagnosis not present

## 2016-04-04 DIAGNOSIS — Z862 Personal history of diseases of the blood and blood-forming organs and certain disorders involving the immune mechanism: Secondary | ICD-10-CM

## 2016-04-04 DIAGNOSIS — R7989 Other specified abnormal findings of blood chemistry: Secondary | ICD-10-CM

## 2016-04-04 LAB — BASIC METABOLIC PANEL
BUN: 14 mg/dL (ref 7–25)
CALCIUM: 9.1 mg/dL (ref 8.6–10.4)
CHLORIDE: 107 mmol/L (ref 98–110)
CO2: 26 mmol/L (ref 20–31)
CREATININE: 1.17 mg/dL — AB (ref 0.50–1.05)
GLUCOSE: 80 mg/dL (ref 65–99)
Potassium: 4.2 mmol/L (ref 3.5–5.3)
Sodium: 142 mmol/L (ref 135–146)

## 2016-04-04 LAB — POCT CBC
Granulocyte percent: 56.2 %G (ref 37–80)
HCT, POC: 30.3 % — AB (ref 37.7–47.9)
HEMOGLOBIN: 10.5 g/dL — AB (ref 12.2–16.2)
Lymph, poc: 1.5 (ref 0.6–3.4)
MCH: 28.5 pg (ref 27–31.2)
MCHC: 34.9 g/dL (ref 31.8–35.4)
MCV: 81.7 fL (ref 80–97)
MID (cbc): 0.2 (ref 0–0.9)
MPV: 8.6 fL (ref 0–99.8)
PLATELET COUNT, POC: 249 10*3/uL (ref 142–424)
POC Granulocyte: 2.1 (ref 2–6.9)
POC LYMPH PERCENT: 39.4 %L (ref 10–50)
POC MID %: 4.4 % (ref 0–12)
RBC: 3.7 M/uL — AB (ref 4.04–5.48)
RDW, POC: 14.9 %
WBC: 3.8 10*3/uL — AB (ref 4.6–10.2)

## 2016-04-04 LAB — IRON AND TIBC
%SAT: 8 % — ABNORMAL LOW (ref 11–50)
Iron: 28 ug/dL — ABNORMAL LOW (ref 45–160)
TIBC: 369 ug/dL (ref 250–450)
UIBC: 341 ug/dL (ref 125–400)

## 2016-04-04 MED ORDER — DILTIAZEM HCL ER COATED BEADS 120 MG PO TB24: 120.0000 mg | ORAL_TABLET | Freq: Every day | ORAL | Status: DC

## 2016-04-04 NOTE — Patient Instructions (Addendum)
Stop the amlodipine  Begin diltiazem 120 mg LA one daily for blood pressure and palpitations  Discontinue the amlodipine, which may be making you swell more.  We are rechecking your kidney function tests. If the creatinine is still high may have to take you off for fluid pill.  Referral to ENT for loss of smell  Resume taking iron twice daily for about 2 months, then once daily if tolerated. You should get your blood rechecked in 3 or 4 months.  We will let you know the results of your remaining labs in a few days.    IF you received an x-ray today, you will receive an invoice from [facility]. Please contact [facility] at [phone] with questions or concerns regarding your invoice.   IF you received labwork today, you will receive an invoice from [employer]. Please contact [facility] at [phone] with questions or concerns regarding your invoice.   Our billing staff will not be able to assist you with questions regarding bills from these companies.  You will be contacted with the lab results as soon as they are available. The fastest way to get your results is to activate your My Chart account. Instructions are located on the last page of this paperwork. If you have not heard from [place] regarding the results in 2 weeks, please contact this office.

## 2016-04-04 NOTE — Progress Notes (Signed)
Patient ID: Heidi Herrera, female    DOB: [birth year]  Age: 50 y.o. MRN: [id number]  Chief Complaint  Patient presents with  . Follow-up    blood work    Subjective:   Patient is here for several things. Last time she had her labs checked her creatinine was a little high again, and she was concerned about that since she has a horseshoe kidney history. She has a history of anemia. She still menstruates heavily. She wanted her iron checked. She needs her blood pressure checked. She has been flying more with her job and had some questions about prevention of blood clots. Lastly she has been losing her sense of smell and would like to see an ear nose and throat doctor for that.  Current allergies, medications, problem list, past/family and social histories reviewed.  Objective:  BP 130/82 mmHg  Pulse 66  Temp(Src) 98.2 F (36.8 C) (Oral)  Resp 14  Ht 5\' 6"  (1.676 m)  Wt 193 lb (87.544 kg)  BMI 31.17 kg/m2  SpO2 98%  LMP 03/25/2016  Pleasant, no major acute distress. TMs normal. Throat clear. Nose clear. Neck supple without nodes. Chest clear to auscultation. Heart regular without murmur.  Assessment & Plan:   Assessment: 1. Essential hypertension   2. Elevated serum creatinine   3. History of anemia   4. Loss of sense of smell   5. Edema, unspecified type       Plan: Check her labs. Change her amlodipine to diltiazem to try and eliminate the edema. She also is been having palpitation issues and that might help that. Cannot put her on medical hold due to a history of problems with a beta blocker in the past.  Orders Placed This Encounter  Procedures  . Basic metabolic panel  . CBC  . Iron and TIBC    Meds ordered this encounter  Medications  . diltiazem (CARDIZEM LA) 120 MG 24 hr tablet    Sig: Take 1 tablet (120 mg total) by mouth daily.    Dispense:  30 tablet    Refill:  5       Patient Instructions   Stop the amlodipine  Begin diltiazem 120 mg LA one  daily for blood pressure and palpitations  Discontinue the amlodipine, which may be making you swell more.  We are rechecking your kidney function tests. If the creatinine is still high may have to take you off for fluid pill.  Referral to ENT for loss of smell  Resume taking iron twice daily for about 2 months, then once daily if tolerated. You should get your blood rechecked in 3 or 4 months.  We will let you know the results of your remaining labs in a few days.    IF you received an x-ray today, you will receive an invoice from [facility]. Please contact [facility] at [phone] with questions or concerns regarding your invoice.   IF you received labwork today, you will receive an invoice from [employer]. Please contact [facility] at [phone] with questions or concerns regarding your invoice.   Our billing staff will not be able to assist you with questions regarding bills from these companies.  You will be contacted with the lab results as soon as they are available. The fastest way to get your results is to activate your My Chart account. Instructions are located on the last page of this paperwork. If you have not heard from [place] regarding the results in 2  weeks, please contact this office.          Return in about 3 months (around 07/04/2016).   HOPPER,DAVID, MD 04/04/2016

## 2016-04-06 ENCOUNTER — Telehealth: Payer: Self-pay

## 2016-04-06 MED ORDER — DILTIAZEM HCL ER COATED BEADS 120 MG PO CP24: 120.0000 mg | ORAL_CAPSULE | Freq: Every day | ORAL | Status: DC

## 2016-04-06 NOTE — Telephone Encounter (Signed)
[employer] states pt's insurance isn't covering her CARDIZEM LA but wanted to know if they could change to [facility] CD instead. Please call [phone]

## 2016-04-06 NOTE — Telephone Encounter (Signed)
This is also a 24 hr dose of cardizem and is new med for pt. I sent new Rx for the different form of same dose of cardizem 120 mg, 24 hr.

## 2016-04-07 ENCOUNTER — Telehealth: Payer: Self-pay

## 2016-04-07 NOTE — Telephone Encounter (Signed)
Patient called back about her labwork.  I relayed the information that Dr Linna Darner stated in the lab notes.  The patient would also like a copy of her lab results mailed to her.  CB#: [phone]

## 2016-04-08 NOTE — Telephone Encounter (Signed)
Labs mailed

## 2016-04-09 ENCOUNTER — Telehealth: Payer: Self-pay

## 2016-04-09 NOTE — Telephone Encounter (Signed)
After speaking to Dr Linna Darner, I called pt and asked her to come back for re-check, and during conversation discovered that she had DCd her valsartan HCTZ in error along with the amlodipine. I knew that Dr Linna Darner based his inst's on the thinking that she was still taking the HCTZ. Advised Dr Linna Darner and he advised that pt should start back on the valsartan HCTZ and RTC if the edema has not resolved early next week. Pt agreed.

## 2016-04-09 NOTE — Telephone Encounter (Signed)
Pt called to report that she has been taking her new BP med, Cardizem since Rxd at [place] and her legs are swelling very badly. She was experiencing some edema on prev BP med and frequent palpitations so Dr Linna Darner had changed her med. The palpitations have resolved, but the edema is much worse. She has swelling all the way into her calves and they feel very tight. Dr Linna Darner, please advise. Does pt need to be seen? Or do you want to change her medication again to something else?

## 2016-04-14 ENCOUNTER — Other Ambulatory Visit: Payer: Self-pay | Admitting: Obstetrics and Gynecology

## 2016-04-14 DIAGNOSIS — D259 Leiomyoma of uterus, unspecified: Secondary | ICD-10-CM

## 2016-04-16 ENCOUNTER — Encounter: Payer: Self-pay | Admitting: *Deleted

## 2016-04-22 ENCOUNTER — Ambulatory Visit
Admission: RE | Admit: 2016-04-22 | Discharge: 2016-04-22 | Disposition: A | Discharge status: Home / Self Care | Payer: BC Managed Care – PPO | Source: Ambulatory Visit | Attending: Obstetrics and Gynecology | Admitting: Obstetrics and Gynecology

## 2016-04-22 DIAGNOSIS — D259 Leiomyoma of uterus, unspecified: Secondary | ICD-10-CM

## 2016-04-22 MED ORDER — GADOBENATE DIMEGLUMINE 529 MG/ML IV SOLN
18.0000 mL | Freq: Once | INTRAVENOUS | Status: AC | PRN
  Administered 2016-04-22: 18 mL via INTRAVENOUS

## 2016-04-23 ENCOUNTER — Other Ambulatory Visit (HOSPITAL_COMMUNITY): Payer: Self-pay | Admitting: Interventional Radiology

## 2016-04-23 DIAGNOSIS — D259 Leiomyoma of uterus, unspecified: Secondary | ICD-10-CM

## 2016-04-28 ENCOUNTER — Ambulatory Visit (INDEPENDENT_AMBULATORY_CARE_PROVIDER_SITE_OTHER): Payer: BC Managed Care – PPO | Admitting: Physician Assistant

## 2016-04-28 VITALS — BP 126/86 | HR 71 | Temp 98.1°F | Resp 18 | Ht 66.0 in | Wt 195.8 lb

## 2016-04-28 DIAGNOSIS — R519 Headache, unspecified: Secondary | ICD-10-CM

## 2016-04-28 DIAGNOSIS — H538 Other visual disturbances: Secondary | ICD-10-CM

## 2016-04-28 DIAGNOSIS — R51 Headache: Secondary | ICD-10-CM

## 2016-04-28 LAB — POCT CBC
Granulocyte percent: 44.6 %G (ref 37–80)
HCT, POC: 30.6 % — AB (ref 37.7–47.9)
HEMOGLOBIN: 10.4 g/dL — AB (ref 12.2–16.2)
Lymph, poc: 2 (ref 0.6–3.4)
MCH, POC: 27.4 pg (ref 27–31.2)
MCHC: 33.9 g/dL (ref 31.8–35.4)
MCV: 81 fL (ref 80–97)
MID (cbc): 0.3 (ref 0–0.9)
MPV: 7.4 fL (ref 0–99.8)
PLATELET COUNT, POC: 275 10*3/uL (ref 142–424)
POC Granulocyte: 1.8 — AB (ref 2–6.9)
POC LYMPH PERCENT: 48.4 %L (ref 10–50)
POC MID %: 7 %M (ref 0–12)
RBC: 3.77 M/uL — AB (ref 4.04–5.48)
RDW, POC: 15.4 %
WBC: 4.1 10*3/uL — AB (ref 4.6–10.2)

## 2016-04-28 MED ORDER — PREDNISOLONE SODIUM PHOSPHATE 15 MG/5ML PO SOLN
60.0000 mg | Freq: Once | ORAL | Status: AC
  Administered 2016-04-28: 60 mg via ORAL

## 2016-04-28 NOTE — Patient Instructions (Signed)
Take tylenol 1000 mg (2 tabs) for your headache. You got 60 mg prednisone tonight You will get a phone call tomorrow between 9-10 AM about an appt with eye doctor, Dr. Katy Fitch We will talk about what to do from there and he will let you know if you need to continue prednisone

## 2016-04-28 NOTE — Progress Notes (Signed)
[facility] [address], [place] [address] [phone]  Date:  04/28/2016   Name:  Heidi Herrera   DOB:  16-Jun-1966   MRN:  [id number]  PCP:  Kennon Portela, MD    Chief Complaint: Hypertension and Headache   History of Present Illness:  This is a 50 y.o. female with PMH HTN, HLD, anxiety, esophageal stricture, palps who is presenting with headaches x 3 days. Headache is worse with standing up and a little better with sitting down. About the same with laying down and sitting. Located to right temporal region, throbbing in nature. Feels her vision in the right eye is blurred. Position does not change the blurriness -- not worse with bending forward.  Denies sensitivity to light or sound. Denies n/v. Pain was waking her up during the night last night. Never had a headache like this before. Denies fever, chills, diaphoresis, eye drainage, nasal rhinorrhea. She was last seen here 3 weeks ago by Dr. Linna Darner. Had elevated BP with systolic 350. She was also having some mild bilateral ankle swelling. Her amlodipine was discontinued. Diltiazem was added -- she has a hx of palps over the past year (holter neg except for PVCs). Dilt thought to maybe helps palps which she thinks her palps have been a little better. She denies cp, sob.  Review of Systems:   Review of Systems See HPI  Patient Active Problem List   Diagnosis Date Noted  . Fibroid, uterine   . Essential hypertension 09/29/2014  . Anemia 02/02/2014  . VITAMIN D DEFICIENCY 12/18/2010  . HYPERLIPIDEMIA 12/18/2010  . CHEST PAIN 12/18/2010  . NEPHROLITHIASIS 03/24/2010  . 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 GRANULOMA 10/09/2009  . ESOPHAGEAL STRICTURE 08/27/2009  . GERD 08/27/2009  . Anxiety state 04/20/2008  . HYPERTENSION 04/20/2008  . OBESITY, NOS 02/10/2007  . HYPERTENSION, BENIGN SYSTEMIC 02/10/2007  . FIBROADENOSIS, BREAST 02/10/2007    Prior to Admission medications   Medication Sig Start Date  End Date Taking? Authorizing Provider  amLODipine (NORVASC) 5 MG tablet Take 1 tablet (5 mg total) by mouth daily. 02/13/16  Yes Posey Boyer, MD  Ascorbic Acid (VITAMIN C) 1000 MG tablet Take 2,000 mg by mouth 2 (two) times daily.    Yes Historical Provider, MD  Biotin 1000 MCG tablet Take 1,000 mcg by mouth once.   Yes Historical Provider, MD  calcium-vitamin D (OSCAL WITH D) 500-200 MG-UNIT per tablet Take 1 tablet by mouth 2 (two) times daily. Calcium 1000 mg with Magnesium 400 mg and zinc 15 mg, taking 1 capsule 2 times daily   Yes Historical Provider, MD  cetirizine (ZYRTEC) 10 MG tablet Take 1 tablet (10 mg total) by mouth daily. 02/13/16  Yes Posey Boyer, MD  co-enzyme Q-10 30 MG capsule Take 200 mg by mouth daily.   Yes Historical Provider, MD  Cyanocobalamin (VITAMIN B 12 PO) Take 1 tablet by mouth daily.   Yes Historical Provider, MD  diltiazem (CARDIZEM CD) 120 MG 24 hr capsule Take 1 capsule (120 mg total) by mouth daily. 04/06/16  Yes Posey Boyer, MD  Multiple Vitamins-Minerals (ALIVE ONCE DAILY WOMENS 50+ PO) Take 1 tablet by mouth daily.   Yes Historical Provider, MD  Omega 3-6-9 Fatty Acids (OMEGA-3-6-9 PO) Take 1 capsule by mouth daily.   Yes Historical Provider, MD  psyllium (METAMUCIL) 58.6 % powder Take 1 packet by mouth 3 (three) times daily. Taking sugar free 2-3 times daily.   Yes  Historical Provider, MD  albuterol (PROVENTIL HFA;VENTOLIN HFA) 108 (90 BASE) MCG/ACT inhaler Inhale 2 puffs into the lungs every 4 (four) hours as needed for wheezing or shortness of breath (cough, shortness of breath or wheezing.). Patient not taking: Reported on 04/28/2016 02/20/15   Posey Boyer, MD  valsartan-hydrochlorothiazide (DIOVAN HCT) 160-12.5 MG tablet Take 1 tablet by mouth daily. Patient not taking: Reported on 04/28/2016 02/13/16   Posey Boyer, MD    Allergies  Allergen Reactions  . Bystolic [Nebivolol Hcl] Anaphylaxis, Itching and Swelling  . Biaxin [Clarithromycin]     The  category of the Biaxin family.  Also allergic to water chestnuts.    Past Surgical History  Procedure Laterality Date  . Lobectomy  2009  . Myomectomy  2007    Social History  Substance Use Topics  . Smoking status: Never Smoker   . Smokeless tobacco: Never Used  . Alcohol Use: Yes    Family History  Problem Relation Age of Onset  . Hypertension Mother   . Obesity Mother   . Stroke Mother   . Diabetes Mother   . Hypertension Sister   . Hyperlipidemia Sister   . Obesity Sister   . Diabetes Sister   . Heart disease Sister   . Mental illness Sister   . Hypertension Brother   . Obesity Brother   . Stroke Brother   . Mental illness Father   . Hypertension Father   . Cancer Brother   . Stroke Brother   . Diabetes Sister   . Hypertension Sister   . Mental illness Sister     Medication list has been reviewed and updated.  Physical Examination:  Physical Exam  Constitutional: She is oriented to person, place, and time. She appears well-developed and well-nourished. No distress.  HENT:  Head: Normocephalic and atraumatic.  Right Ear: Hearing, tympanic membrane, external ear and ear canal normal.  Left Ear: Hearing, tympanic membrane, external ear and ear canal normal.  Nose: Nose normal. Right sinus exhibits no maxillary sinus tenderness and no frontal sinus tenderness. Left sinus exhibits no maxillary sinus tenderness and no frontal sinus tenderness.  Mouth/Throat: Uvula is midline, oropharynx is clear and moist and mucous membranes are normal.  Eyes: Conjunctivae, EOM and lids are normal. Pupils are equal, round, and reactive to light. Right eye exhibits no discharge. Left eye exhibits no discharge. No scleral icterus.  Mild TTP over temporal artery  Neck: Trachea normal. Carotid bruit is not present. No Brudzinski's sign noted. No thyromegaly present.  Cardiovascular: Normal rate, regular rhythm, normal heart sounds and normal pulses.   No murmur  heard. Pulmonary/Chest: Effort normal and breath sounds normal. No respiratory distress. She has no wheezes. She has no rhonchi. She has no rales.  Musculoskeletal: Normal range of motion.       Cervical back: Normal. She exhibits normal range of motion, no tenderness and no bony tenderness.  Lymphadenopathy:       Head (right side): No submental, no submandibular and no tonsillar adenopathy present.       Head (left side): No submental, no submandibular and no tonsillar adenopathy present.    She has cervical adenopathy (1 cm mobile nontender, left anterior (chronic)).  Neurological: She is alert and oriented to person, place, and time. She has normal strength and normal reflexes. No cranial nerve deficit or sensory deficit. Gait normal.  Skin: Skin is warm, dry and intact. No lesion and no rash noted.  No rashes in scalp  Psychiatric: She has a normal mood and affect. Her speech is normal and behavior is normal. Thought content normal.   BP 126/86 mmHg  Pulse 71  Temp(Src) 98.1 F (36.7 C) (Oral)  Resp 18  Ht _0  (1.676 m)  Wt 195 lb 12.8 oz (88.814 kg)  BMI 31.62 kg/m2  SpO2 98%  LMP 04/24/2016   Visual Acuity Screening   Right eye Left eye Both eyes  Without correction:     With correction: 20/30 20/15 -2 20/13   Orthostatic VS for the past 24 hrs:  BP- Lying Pulse- Lying BP- Sitting Pulse- Sitting BP- Standing at 0 minutes Pulse- Standing at 0 minutes  04/28/16 1927 137/88 mmHg 67 (!) 153/97 mmHg 77 (!) 162/105 mmHg 80   Results for orders placed or performed in visit on 04/28/16  POCT CBC  Result Value Ref Range   WBC 4.1 (A) 4.6 - 10.2 K/uL   Lymph, poc 2.0 0.6 - 3.4   POC LYMPH PERCENT 48.4 10 - 50 %L   MID (cbc) 0.3 0 - 0.9   POC MID % 7.0 0 - 12 %M   POC Granulocyte 1.8 (A) 2 - 6.9   Granulocyte percent 44.6 37 - 80 %G   RBC 3.77 (A) 4.04 - 5.48 M/uL   Hemoglobin 10.4 (A) 12.2 - 16.2 g/dL   HCT, POC 30.6 (A) 37.7 - 47.9 %   MCV 81.0 80 - 97 fL   MCH, POC  27.4 27 - 31.2 pg   MCHC 33.9 31.8 - 35.4 g/dL   RDW, POC 15.4 %   Platelet Count, POC 275 142 - 424 K/uL   MPV 7.4 0 - 99.8 fL   Assessment and Plan:  1. Acute nonintractable headache, unspecified headache type 2. Blurred vision Unknown origin. +tenderness over right temporal area, decreased vision in right eye -- need to r/o GCA. Gave 60 mg pred in office today. ESR and CRP pending. She has been referred STAT to ophthalmology - Will work in to Dr. Zenia Resides schedule tomorrow. CBC wnl. Neuro exam normal. BP did rise with positional change, could be cause. Treat with tylenol and hydration for now until see ophthalmology tmrw. - C-reactive protein - POCT CBC - Sedimentation Rate - prednisoLONE (ORAPRED) 15 MG/5ML solution 60 mg; Take 20 mLs (60 mg total) by mouth once. - Ambulatory referral to Ophthalmology  Discussed case with Dr. Laney Pastor.   Benjaman Pott Drenda Freeze, MHS [facility]  04/28/2016

## 2016-04-29 ENCOUNTER — Telehealth: Payer: Self-pay | Admitting: *Deleted

## 2016-04-29 LAB — SEDIMENTATION RATE: SED RATE: 28 mm/h (ref 0–30)

## 2016-04-29 LAB — C-REACTIVE PROTEIN

## 2016-04-29 NOTE — Telephone Encounter (Signed)
lmom that appt is today at 2pm at [facility].

## 2016-05-01 DIAGNOSIS — R430 Anosmia: Secondary | ICD-10-CM | POA: Insufficient documentation

## 2016-05-01 DIAGNOSIS — L299 Pruritus, unspecified: Secondary | ICD-10-CM | POA: Insufficient documentation

## 2016-05-20 ENCOUNTER — Ambulatory Visit
Admission: RE | Admit: 2016-05-20 | Discharge: 2016-05-20 | Disposition: A | Discharge status: Home / Self Care | Payer: BC Managed Care – PPO | Source: Ambulatory Visit | Attending: Interventional Radiology | Admitting: Interventional Radiology

## 2016-05-20 DIAGNOSIS — D259 Leiomyoma of uterus, unspecified: Secondary | ICD-10-CM

## 2016-05-20 NOTE — Progress Notes (Signed)
Patient ID: Heidi Herrera, female   DOB: Sep 04, 1966, 51 y.o.   MRN: [id number]        Chief Complaint: Symptomatic uterine fibroids  Referring Physician(s): Haygood  History of Present Illness: Heidi Herrera is a 50 y.o. (G1, P78) female with past mental history significant for hypertension obesity and anxiety who returns to the interventional radiology clinic for evaluation of symptomatic uterine fibroids. The patient is unaccompanied and serves as her own historian.  The patient was initially seen in consultation at the interventional radiology clinic on 04/24/2015 however at that time was unsure she wanted to pursue uterine fibroid embolization. Unfortunately, the patient's fibroid related symptoms have worsened during the past year and as such, the patient underwent a contrast enhanced pelvic MRI on 04/22/2016.   The patient returns at interventional radiology clinic to discuss the findings on the contrast enhanced pelvic MRI as well as to again discuss potential percutaneous treatment options of her symptomatic fibroids.  In review, the patient was initially diagnosed with fibroids prior to 2007 at which time she underwent a myomectomy which (per patient report) resulted in the surgical removal of approximately 20 fibroids. Since that time, the patient has experienced severe menorrhagia though this has worsened recently, particularly during the past year. The patient states that her cycles have remained regular occurring every 28 days with her cycle lasting approximately 5 days, 2-3 days of which are now associate with severe menorrhagia including the passage of clots and necessitating the changing of tampons every 2 hours.  The patient again denies intermittent bleeding or spotting.  The patient is anemic and continues on oral iron supplementation. The patient has never received a blood transfusion.   The patient again admits to mood swings which she attributes to a perimenopausal state  though again states her cycles have remained regular. No hot flashes.  Patient again admits to bulk symptoms including frequent urination, nocturia as well as abdominal bloating in consultation, all of which are worse the time of her cycle.  The patient is not interested in undergoing a surgical procedure.  The patient had a negative Pap smear on 04/05/2014 as well as a negative endometrial biopsy on 02/04/2016.  Past Medical History  Diagnosis Date  . GERD (gastroesophageal reflux disease)   . Hypertension   . Anemia   . Obesity   . Sebaceous cyst   . Acid reflux   . Allergy   . Anxiety     Past Surgical History  Procedure Laterality Date  . Lobectomy  2009  . Myomectomy  2007    Allergies: Bystolic and Biaxin  Medications: Prior to Admission medications   Medication Sig Start Date End Date Taking? Authorizing Provider  albuterol (PROVENTIL HFA;VENTOLIN HFA) 108 (90 BASE) MCG/ACT inhaler Inhale 2 puffs into the lungs every 4 (four) hours as needed for wheezing or shortness of breath (cough, shortness of breath or wheezing.). 02/20/15  Yes Posey Boyer, MD  Ascorbic Acid (VITAMIN C) 1000 MG tablet Take 2,000 mg by mouth 2 (two) times daily.    Yes Historical Provider, MD  Biotin 1000 MCG tablet Take 1,000 mcg by mouth once.   Yes Historical Provider, MD  calcium-vitamin D (OSCAL WITH D) 500-200 MG-UNIT per tablet Take 1 tablet by mouth 2 (two) times daily. Calcium 1000 mg with Magnesium 400 mg and zinc 15 mg, taking 1 capsule 2 times daily   Yes Historical Provider, MD  cetirizine (ZYRTEC) 10 MG tablet Take 1 tablet (10 mg total)  by mouth daily. 02/13/16  Yes Posey Boyer, MD  co-enzyme Q-10 30 MG capsule Take 200 mg by mouth daily.   Yes Historical Provider, MD  Cyanocobalamin (VITAMIN B 12 PO) Take 1 tablet by mouth daily.   Yes Historical Provider, MD  diltiazem (CARDIZEM CD) 120 MG 24 hr capsule Take 1 capsule (120 mg total) by mouth daily. 04/06/16  Yes Posey Boyer, MD    iron polysaccharides (NIFEREX) 150 MG capsule Take 150 mg by mouth daily.   Yes Historical Provider, MD  Multiple Vitamins-Minerals (ALIVE ONCE DAILY WOMENS 50+ PO) Take 1 tablet by mouth daily.   Yes Historical Provider, MD  Omega 3-6-9 Fatty Acids (OMEGA-3-6-9 PO) Take 1 capsule by mouth daily.   Yes Historical Provider, MD  psyllium (METAMUCIL) 58.6 % powder Take 1 packet by mouth 3 (three) times daily. Taking sugar free 2-3 times daily.   Yes Historical Provider, MD  valsartan-hydrochlorothiazide (DIOVAN HCT) 160-12.5 MG tablet Take 1 tablet by mouth daily. 02/13/16  Yes Posey Boyer, MD     Family History  Problem Relation Age of Onset  . Hypertension Mother   . Obesity Mother   . Stroke Mother   . Diabetes Mother   . Hypertension Sister   . Hyperlipidemia Sister   . Obesity Sister   . Diabetes Sister   . Heart disease Sister   . Mental illness Sister   . Hypertension Brother   . Obesity Brother   . Stroke Brother   . Mental illness Father   . Hypertension Father   . Cancer Brother   . Stroke Brother   . Diabetes Sister   . Hypertension Sister   . Mental illness Sister     Social History   Social History  . Marital Status: Divorced    Spouse Name: N/A  . Number of Children: N/A  . Years of Education: N/A   Social History Main Topics  . Smoking status: Never Smoker   . Smokeless tobacco: Never Used  . Alcohol Use: Yes  . Drug Use: No  . Sexual Activity: Not on file   Other Topics Concern  . Not on file   Social History Narrative    ECOG Status: 1 - Symptomatic but completely ambulatory  Review of Systems: A 12 point ROS discussed and pertinent positives are indicated in the HPI above.  All other systems are negative.  Review of Systems  Constitutional: Negative for fever, chills, activity change and unexpected weight change.  Respiratory: Negative.   Cardiovascular: Negative.   Gastrointestinal: Positive for constipation and abdominal distention.  Negative for blood in stool.  Genitourinary: Positive for urgency, frequency, menstrual problem and pelvic pain. Negative for flank pain and difficulty urinating.    Vital Signs: BP 127/85 mmHg  Pulse 74  Temp(Src) 98.3 F (36.8 C) (Oral)  Resp 15  Ht 5\' 5"  (1.651 m)  Wt 193 lb (87.544 kg)  BMI 32.12 kg/m2  SpO2 99%  LMP 05/17/2016  Physical Exam  Cardiovascular: Normal rate, regular rhythm and intact distal pulses.   Palpable right common femoral arterial and dorsalis pedis pulses. I am unable to palpate the right posterior tibial artery pulse.  Pulmonary/Chest: Effort normal and breath sounds normal.  Abdominal: Soft. Bowel sounds are normal. She exhibits mass. There is tenderness.  I am able to palpate the uterine fundus at the level of the umbilicus. The patient is mildly tender with palpation of the right lower abdominal quadrant.  Skin: Skin is  warm and dry.  Psychiatric: She has a normal mood and affect. Her behavior is normal.  Nursing note and vitals reviewed.   Imaging: Mr Pelvis W Wo Contrast  04/22/2016  CLINICAL DATA:  Heavy menstrual bleeding and cramping. Uterine leiomyomas for pre uterine artery embolization evaluation. History of uterine myomectomy in 2007. EXAM: MRI PELVIS WITHOUT AND WITH CONTRAST TECHNIQUE: Multiplanar multisequence MR imaging of the pelvis was performed both before and after administration of intravenous contrast. CONTRAST:  68mL MULTIHANCE GADOBENATE DIMEGLUMINE 529 MG/ML IV SOLN COMPARISON:  Pelvic MRI 11/06/2006, abdominal CT 01/14/2008 and PET-CT 02/06/2008. 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 There is no evidence of ureteral dilatation. The bladder appears unremarkable. Bowel: No bowel wall thickening, distention or surrounding inflammation identified within the pelvis. Vascular/Lymphatic: No enlarged pelvic lymph nodes identified. No significant vascular findings. Reproductive:  Uterus: Measures approximately 17.2 x 9.5 x 15.9 cm. Previously approximately 15.3 x 9.8 x 13.3 cm. Multiple intramural and subserosal fibroids are again noted. The largest fibroids include a 7.2 cm left fundal fibroid, a 6.8 cm fibroid in the right uterine body and a 7.7 cm fibroid in the left lower uterine segment. This latter lesion demonstrates mild cystic degeneration and heterogeneous enhancement following contrast. Most of the other lesions are fairly homogeneous with low T2 signal and homogeneous enhancement. No pedunculated subserosal or submucosal fibroids are seen. Endometrium: Distorted and displaced to the right but the multiple fibroids. No endometrial thickening identified. Cervix/Vagina: Vaginal tampon in place. There are several cervical nabothian cysts. Right ovary: Similar to the prior study, uplifted from the pelvis. No adnexal mass. Left ovary:  Appears normal.  No mass identified. Other: There is no ascites or abdominal wall hernia. Musculoskeletal: No acute or worrisome osseous findings. IMPRESSION: 1. Progressive enlargement of the uterus compared with prior study from 2007. There are multiple intramural and subserosal fibroids. No pedunculated lesions are identified. 2. No evidence of endometrial thickening or adnexal mass. Electronically Signed   By: Richardean Sale M.D.   On: 04/22/2016 15:55    Labs:  CBC:  Recent Labs  08/13/15 1653 04/04/16 1124 04/28/16 1939  WBC 4.1* 3.8* 4.1*  HGB 11.6* 10.5* 10.4*  HCT 36.8* 30.3* 30.6*    COAGS: No results for input(s): INR, APTT in the last 8760 hours.  BMP:  Recent Labs  02/13/16 1942 04/04/16 1048  NA 140 142  K 4.0 4.2  CL 105 107  CO2 27 26  GLUCOSE 80 80  BUN 17 14  CALCIUM 9.3 9.1  CREATININE 1.27* 1.17*  GFRNONAA 49*  --   GFRAA 56*  --     LIVER FUNCTION TESTS:  Recent Labs  02/13/16 1942  BILITOT 0.3  AST 14  ALT 9  ALKPHOS 48  PROT 7.7  ALBUMIN 4.1    Assessment and Plan:  Heidi Herrera is a 50 y.o. (G1, P5) female with past mental history significant for hypertension obesity and anxiety who returns to the interventional radiology clinic for evaluation of symptomatic uterine fibroids.   Unfortunately, the patient's fibroid related symptoms have persisted (if not slightly worsened) during the past year. Patient's primarily fibroid related complaint revolves around her persistent menorrhagia so she does admit to bulk symptoms including frequent urination and chronic disease with constipation.  Review of contrast enhanced pelvic MRI performed 04/22/2016 demonstrates a enlarged myomatous uterus with multiple heterogeneously enhancing fibroids, progressed since the 2007 examination.  Prolonged conversations were held with the  patient regarding the potential treatment options for her symptomatic uterine fibroids including continued conservative management and hysterectomy. At the present time, the patient wishes to avoid a surgical procedure though wishes to undergo and intervention.  As such, prolonged conversations were held with the patient regarding the benefits and risks (including but not limited to contrast and radiation exposure, vessel injury and bleeding, nontarget embolization and infection) of uterine fibroid embolization.  Following this prolonged and detailed conversation, the patient now wishes to pursue uterine fibroid embolization.  The patient wishes for this procedure to be performed at the end of the summer (late August, early September) as she is very busy over the summer.  Note, the patient had a negative Pap smear on 04/05/2014 as well as a negative endometrial biopsy on 02/04/2016.  The procedure will ultimately performed at [facility]. The procedure will entail an overnight admission for observation and PCA usage.  The patient was encouraged to call the interventional radiology clinic with any interval questions or concerns.  A copy of this report was  sent to the requesting provider on this date.  Electronically Signed: Sandi Mariscal 05/20/2016, 1:42 PM   I spent a total of 25 Minutes in face to face in clinical consultation, greater than 50% of which was counseling/coordinating care for symptomatic uterine fibroids

## 2016-05-22 ENCOUNTER — Other Ambulatory Visit: Payer: Self-pay | Admitting: Interventional Radiology

## 2016-05-22 DIAGNOSIS — D259 Leiomyoma of uterus, unspecified: Secondary | ICD-10-CM

## 2016-07-07 ENCOUNTER — Ambulatory Visit (INDEPENDENT_AMBULATORY_CARE_PROVIDER_SITE_OTHER): Payer: BC Managed Care – PPO | Admitting: Urgent Care

## 2016-07-07 VITALS — BP 122/72 | HR 96 | Temp 98.5°F | Resp 17 | Ht 65.5 in | Wt 200.0 lb

## 2016-07-07 DIAGNOSIS — R059 Cough, unspecified: Secondary | ICD-10-CM

## 2016-07-07 DIAGNOSIS — I10 Essential (primary) hypertension: Secondary | ICD-10-CM | POA: Diagnosis not present

## 2016-07-07 DIAGNOSIS — R05 Cough: Secondary | ICD-10-CM

## 2016-07-07 DIAGNOSIS — J069 Acute upper respiratory infection, unspecified: Secondary | ICD-10-CM | POA: Diagnosis not present

## 2016-07-07 DIAGNOSIS — J029 Acute pharyngitis, unspecified: Secondary | ICD-10-CM | POA: Diagnosis not present

## 2016-07-07 MED ORDER — VALSARTAN-HYDROCHLOROTHIAZIDE 160-25 MG PO TABS: 1.0000 | ORAL_TABLET | Freq: Every day | ORAL | 3 refills | Status: DC

## 2016-07-07 MED ORDER — HYDROCODONE-HOMATROPINE 5-1.5 MG/5ML PO SYRP: 5.0000 mL | ORAL_SOLUTION | Freq: Every evening | ORAL | 0 refills | Status: DC | PRN

## 2016-07-07 MED ORDER — AMLODIPINE BESYLATE 5 MG PO TABS: 5.0000 mg | ORAL_TABLET | Freq: Every day | ORAL | 3 refills | Status: DC

## 2016-07-07 MED ORDER — BENZONATATE 100 MG PO CAPS: 100.0000 mg | ORAL_CAPSULE | Freq: Three times a day (TID) | ORAL | 0 refills | Status: DC | PRN

## 2016-07-07 NOTE — Progress Notes (Signed)
    MRN: [id number] DOB: 10-24-1966  Subjective:   Heidi Herrera is a 50 y.o. female presenting for chief complaint of Sore Throat; Nasal Congestion; and Cough  Reports 4 day sore throat, dry cough, congestion. Has tried cetirizine for what she thought was allergies with some relief. Denies fever, sinus pain, ear pain, chest pain, shob, n/v, abdominal pain, rashes. Denies history of asthma. Denies smoking cigarettes. Has a history of HTN, managed well with valsartan-HCTZ, diltiazem. Checks her BP at home and generally runs [id number] systolic, occasional [id number]. However, patient complains of significant lower leg swelling despite using compression stockings. Her cardiologist is Dr. Wynonia Lawman, last f/u was 10/2015. Has a history of PVCs, PACs which she denies feeling recently. She was previously on amlodipine and would like to consider going back on that.  Heidi Herrera has a current medication list which includes the following prescription(s): vitamin c, biotin, calcium-vitamin d, cetirizine, co-enzyme q-10, cyanocobalamin, diltiazem, iron polysaccharides, multiple vitamins-minerals, omega 3-6-9 fatty acids, psyllium, valsartan-hydrochlorothiazide, and albuterol. Also is allergic to bystolic [nebivolol hcl] and biaxin [clarithromycin].  Heidi Herrera  has a past medical history of Acid reflux; Allergy; Anemia; Anxiety; GERD (gastroesophageal reflux disease); Hypertension; Obesity; and Sebaceous cyst. Also  has a past surgical history that includes Lobectomy (2009) and Myomectomy (2007).  Objective:   Vitals: BP 122/72 (BP Location: Right Arm, Patient Position: Sitting, Cuff Size: Normal)   Pulse 96   Temp 98.5 F (36.9 C) (Oral)   Resp 17   Ht 5' 5.5" (1.664 m)   Wt 200 lb (90.7 kg)   LMP 06/16/2016 (Approximate)   SpO2 97%   BMI 32.78 kg/m   Physical Exam  Constitutional: She is oriented to person, place, and time. She appears well-developed and well-nourished.  Cardiovascular: Normal rate, regular rhythm  and intact distal pulses.  Exam reveals no gallop and no friction rub.   No murmur heard. Pulmonary/Chest: No respiratory distress. She has no wheezes. She has no rales.  Neurological: She is alert and oriented to person, place, and time.   Assessment and Plan :   1. Viral upper respiratory infection 2. Sore throat 3. Cough - Advised supportive care, start cough suppression. Hydrate well and continue Zyrtec. RTC in 1 week if no improvement.  4. Essential hypertension - Patient will set up a recheck with her cardiologist Dr. Wynonia Lawman. D/c diltiazem, restart amlodipine. Increase HCTZ from 12.5mg  to 25mg  with valsartan. Recheck in 4 weeks.  Jaynee Eagles, PA-C [facility] [phone] 07/07/2016 5:43 PM

## 2016-07-07 NOTE — Patient Instructions (Addendum)
Upper Respiratory Infection, Adult Most upper respiratory infections (URIs) are a viral infection of the air passages leading to the lungs. A URI affects the nose, throat, and upper air passages. The most common type of URI is nasopharyngitis and is typically referred to as "the common cold." URIs run their course and usually go away on their own. Most of the time, a URI does not require medical attention, but sometimes a bacterial infection in the upper airways can follow a viral infection. This is called a secondary infection. Sinus and middle ear infections are common types of secondary upper respiratory infections. Bacterial pneumonia can also complicate a URI. A URI can worsen asthma and chronic obstructive pulmonary disease (COPD). Sometimes, these complications can require emergency medical care and may be life threatening.  CAUSES Almost all URIs are caused by viruses. A virus is a type of germ and can spread from one person to another.  RISKS FACTORS You may be at risk for a URI if:   You smoke.   You have chronic heart or lung disease.  You have a weakened defense (immune) system.   You are very young or very old.   You have nasal allergies or asthma.  You work in crowded or poorly ventilated areas.  You work in health care facilities or schools. SIGNS AND SYMPTOMS  Symptoms typically develop 2-3 days after you come in contact with a cold virus. Most viral URIs last 7-10 days. However, viral URIs from the influenza virus (flu virus) can last 14-18 days and are typically more severe. Symptoms may include:   Runny or stuffy (congested) nose.   Sneezing.   Cough.   Sore throat.   Headache.   Fatigue.   Fever.   Loss of appetite.   Pain in your forehead, behind your eyes, and over your cheekbones (sinus pain).  Muscle aches.  DIAGNOSIS  Your health care provider may diagnose a URI by:  Physical exam.  Tests to check that your symptoms are not due to  another condition such as:  Strep throat.  Sinusitis.  Pneumonia.  Asthma. TREATMENT  A URI goes away on its own with time. It cannot be cured with medicines, but medicines may be prescribed or recommended to relieve symptoms. Medicines may help:  Reduce your fever.  Reduce your cough.  Relieve nasal congestion. HOME CARE INSTRUCTIONS   Take medicines only as directed by your health care provider.   Gargle warm saltwater or take cough drops to comfort your throat as directed by your health care provider.  Use a warm mist humidifier or inhale steam from a shower to increase air moisture. This may make it easier to breathe.  Drink enough fluid to keep your urine clear or pale yellow.   Eat soups and other clear broths and maintain good nutrition.   Rest as needed.   Return to work when your temperature has returned to normal or as your health care provider advises. You may need to stay home longer to avoid infecting others. You can also use a face mask and careful hand washing to prevent spread of the virus.  Increase the usage of your inhaler if you have asthma.   Do not use any tobacco products, including cigarettes, chewing tobacco, or electronic cigarettes. If you need help quitting, ask your health care provider. PREVENTION  The best way to protect yourself from getting a cold is to practice good hygiene.   Avoid oral or hand contact with people with cold   symptoms.   Wash your hands often if contact occurs.  There is no clear evidence that vitamin C, vitamin E, echinacea, or exercise reduces the chance of developing a cold. However, it is always recommended to get plenty of rest, exercise, and practice good nutrition.  SEEK MEDICAL CARE IF:   You are getting worse rather than better.   Your symptoms are not controlled by medicine.   You have chills.  You have worsening shortness of breath.  You have brown or red mucus.  You have yellow or brown nasal  discharge.  You have pain in your face, especially when you bend forward.  You have a fever.  You have swollen neck glands.  You have pain while swallowing.  You have white areas in the back of your throat. SEEK IMMEDIATE MEDICAL CARE IF:   You have severe or persistent:  Headache.  Ear pain.  Sinus pain.  Chest pain.  You have chronic lung disease and any of the following:  Wheezing.  Prolonged cough.  Coughing up blood.  A change in your usual mucus.  You have a stiff neck.  You have changes in your:  Vision.  Hearing.  Thinking.  Mood. MAKE SURE YOU:   Understand these instructions.  Will watch your condition.  Will get help right away if you are not doing well or get worse.   This information is not intended to replace advice given to you by your health care provider. Make sure you discuss any questions you have with your health care provider.   Document Released: 05/26/2001 Document Revised: 04/16/2015 Document Reviewed: 03/07/2014 Elsevier Interactive Patient Education 2016 [employer].    Hypertension Hypertension, commonly called high blood pressure, is when the force of blood pumping through your arteries is too strong. Your arteries are the blood vessels that carry blood from your heart throughout your body. A blood pressure reading consists of a higher number over a lower number, such as 110/72. The higher number (systolic) is the pressure inside your arteries when your heart pumps. The lower number (diastolic) is the pressure inside your arteries when your heart relaxes. Ideally you want your blood pressure below 120/80. Hypertension forces your heart to work harder to pump blood. Your arteries may become narrow or stiff. Having untreated or uncontrolled hypertension can cause heart attack, stroke, kidney disease, and other problems. RISK FACTORS Some risk factors for high blood pressure are controllable. Others are not.  Risk factors you  cannot control include:   Race. You may be at higher risk if you are African American.  Age. Risk increases with age.  Gender. Men are at higher risk than women before age 67 years. After age 24, women are at higher risk than men. Risk factors you can control include:  Not getting enough exercise or physical activity.  Being overweight.  Getting too much fat, sugar, calories, or salt in your diet.  Drinking too much alcohol. SIGNS AND SYMPTOMS Hypertension does not usually cause signs or symptoms. Extremely high blood pressure (hypertensive crisis) may cause headache, anxiety, shortness of breath, and nosebleed. DIAGNOSIS To check if you have hypertension, your health care provider will measure your blood pressure while you are seated, with your arm held at the level of your heart. It should be measured at least twice using the same arm. Certain conditions can cause a difference in blood pressure between your right and left arms. A blood pressure reading that is higher than normal on one occasion does  not mean that you need treatment. If it is not clear whether you have high blood pressure, you may be asked to return on a different day to have your blood pressure checked again. Or, you may be asked to monitor your blood pressure at home for 1 or more weeks. TREATMENT Treating high blood pressure includes making lifestyle changes and possibly taking medicine. Living a healthy lifestyle can help lower high blood pressure. You may need to change some of your habits. Lifestyle changes may include:  Following the DASH diet. This diet is high in fruits, vegetables, and whole grains. It is low in salt, red meat, and added sugars.  Keep your sodium intake below 2,300 mg per day.  Getting at least 30-45 minutes of aerobic exercise at least 4 times per week.  Losing weight if necessary.  Not smoking.  Limiting alcoholic beverages.  Learning ways to reduce stress. Your health care provider  may prescribe medicine if lifestyle changes are not enough to get your blood pressure under control, and if one of the following is true:  You are 47-17 years of age and your systolic blood pressure is above 140.  You are 39 years of age or older, and your systolic blood pressure is above 150.  Your diastolic blood pressure is above 90.  You have diabetes, and your systolic blood pressure is over [id number] or your diastolic blood pressure is over 90.  You have kidney disease and your blood pressure is above 140/90.  You have heart disease and your blood pressure is above 140/90. Your personal target blood pressure may vary depending on your medical conditions, your age, and other factors. HOME CARE INSTRUCTIONS  Have your blood pressure rechecked as directed by your health care provider.   Take medicines only as directed by your health care provider. Follow the directions carefully. Blood pressure medicines must be taken as prescribed. The medicine does not work as well when you skip doses. Skipping doses also puts you at risk for problems.  Do not smoke.   Monitor your blood pressure at home as directed by your health care provider. SEEK MEDICAL CARE IF:   You think you are having a reaction to medicines taken.  You have recurrent headaches or feel dizzy.  You have swelling in your ankles.  You have trouble with your vision. SEEK IMMEDIATE MEDICAL CARE IF:  You develop a severe headache or confusion.  You have unusual weakness, numbness, or feel faint.  You have severe chest or abdominal pain.  You vomit repeatedly.  You have trouble breathing. MAKE SURE YOU:   Understand these instructions.  Will watch your condition.  Will get help right away if you are not doing well or get worse.   This information is not intended to replace advice given to you by your health care provider. Make sure you discuss any questions you have with your health care provider.   Document  Released: 11/30/2005 Document Revised: 04/16/2015 Document Reviewed: 09/22/2013 Elsevier Interactive Patient Education 2016 [employer].   IF you received an x-ray today, you will receive an invoice from [facility]. Please contact [facility] at [phone] with questions or concerns regarding your invoice.   IF you received labwork today, you will receive an invoice from [employer]. Please contact [facility] at [phone] with questions or concerns regarding your invoice.   Our billing staff will not be able to assist you with questions regarding bills from these companies.  You will be  contacted with the lab results as soon as they are available. The fastest way to get your results is to activate your My Chart account. Instructions are located on the last page of this paperwork. If you have not heard from [place] regarding the results in 2 weeks, please contact this office.

## 2016-07-09 ENCOUNTER — Telehealth: Payer: Self-pay

## 2016-07-09 NOTE — Telephone Encounter (Signed)
Micah provided the correct information to the patient given A&P. If the patient wants a work note, that is okay. But please let her know that this is not qualified for either disability or FMLA. Thank you.

## 2016-07-09 NOTE — Telephone Encounter (Signed)
Advised pt to continue taking medications, hydrate well and to RTC if not better in a week based on notes/plan in her chart. Pt is requesting a doctors note for work for yesterday, today and tomorrow.

## 2016-07-09 NOTE — Telephone Encounter (Signed)
Pt was seem recenty about a cough and is still coughing pt would like to know what to do next   Best number [phone]

## 2016-07-10 NOTE — Telephone Encounter (Signed)
Left message letter ready to pick up/

## 2016-08-03 ENCOUNTER — Telehealth: Payer: Self-pay

## 2016-08-03 NOTE — Telephone Encounter (Signed)
error 

## 2016-08-25 ENCOUNTER — Ambulatory Visit (INDEPENDENT_AMBULATORY_CARE_PROVIDER_SITE_OTHER): Payer: BC Managed Care – PPO | Admitting: Family Medicine

## 2016-08-25 VITALS — BP 118/80 | HR 71 | Temp 98.8°F | Resp 17 | Ht 65.5 in | Wt 201.0 lb

## 2016-08-25 DIAGNOSIS — L02811 Cutaneous abscess of head [any part, except face]: Secondary | ICD-10-CM | POA: Diagnosis not present

## 2016-08-25 MED ORDER — CEPHALEXIN 500 MG PO CAPS: 1000.0000 mg | ORAL_CAPSULE | Freq: Two times a day (BID) | ORAL | 0 refills | Status: AC

## 2016-08-25 NOTE — Progress Notes (Signed)
Patient ID: Heidi Herrera, female    DOB: 05/16/1966, 50 y.o.   MRN: [id number]  PCP: Kennon Portela, MD  Chief Complaint  Patient presents with  . Mass    On back of head since sunday     Subjective:   HPI 50 year old presents for evaluation of a small swollen mass lower scalp of head.  She describes scratching what she thought was a bump on the back of her head times 3 days.  She noticed that the mass begin to increase in size, began to swell, and became painful. She denies fever or exudate expressed from the mass.  Patient is unaware of what type of insect or agent she may have come in contact with that caused the mass to develop.   Social History   Social History  . Marital status: Divorced    Spouse name: N/A  . Number of children: N/A  . Years of education: N/A   Occupational History  . Not on file.   Social History Main Topics  . Smoking status: Never Smoker  . Smokeless tobacco: Never Used  . Alcohol use Yes  . Drug use: No  . Sexual activity: Not on file   Other Topics Concern  . Not on file   Social History Narrative  . No narrative on file   Family History  Problem Relation Age of Onset  . Hypertension Mother   . Obesity Mother   . Stroke Mother   . Diabetes Mother   . Hypertension Sister   . Hyperlipidemia Sister   . Obesity Sister   . Diabetes Sister   . Heart disease Sister   . Mental illness Sister   . Hypertension Brother   . Obesity Brother   . Stroke Brother   . Mental illness Father   . Hypertension Father   . Cancer Brother   . Stroke Brother   . Diabetes Sister   . Hypertension Sister   . Mental illness Sister     Review of Systems  See HPI   Patient Active Problem List   Diagnosis Date Noted  . Fibroid, uterine   . Essential hypertension 09/29/2014  . Anemia 02/02/2014  . VITAMIN D DEFICIENCY 12/18/2010  . HYPERLIPIDEMIA 12/18/2010  . CHEST PAIN 12/18/2010  . NEPHROLITHIASIS 03/24/2010  . 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 GRANULOMA 10/09/2009  . ESOPHAGEAL STRICTURE 08/27/2009  . GERD 08/27/2009  . Anxiety state 04/20/2008  . HYPERTENSION 04/20/2008  . OBESITY, NOS 02/10/2007  . HYPERTENSION, BENIGN SYSTEMIC 02/10/2007  . FIBROADENOSIS, BREAST 02/10/2007     Prior to Admission medications   Medication Sig Start Date End Date Taking? Authorizing Provider  Ascorbic Acid (VITAMIN C) 1000 MG tablet Take 2,000 mg by mouth 2 (two) times daily.    Yes Historical Provider, MD  Biotin 1000 MCG tablet Take 1,000 mcg by mouth once.   Yes Historical Provider, MD  calcium-vitamin D (OSCAL WITH D) 500-200 MG-UNIT per tablet Take 1 tablet by mouth 2 (two) times daily. Calcium 1000 mg with Magnesium 400 mg and zinc 15 mg, taking 1 capsule 2 times daily   Yes Historical Provider, MD  cetirizine (ZYRTEC) 10 MG tablet Take 1 tablet (10 mg total) by mouth daily. 02/13/16  Yes Posey Boyer, MD  co-enzyme Q-10 30 MG capsule Take 200 mg by mouth daily.   Yes Historical Provider, MD  Cyanocobalamin (VITAMIN B 12 PO) Take 1 tablet by mouth daily.   Yes Historical  Provider, MD  iron polysaccharides (NIFEREX) 150 MG capsule Take 150 mg by mouth daily.   Yes Historical Provider, MD  Multiple Vitamins-Minerals (ALIVE ONCE DAILY WOMENS 50+ PO) Take 1 tablet by mouth daily.   Yes Historical Provider, MD  Omega 3-6-9 Fatty Acids (OMEGA-3-6-9 PO) Take 1 capsule by mouth daily.   Yes Historical Provider, MD  psyllium (METAMUCIL) 58.6 % powder Take 1 packet by mouth 3 (three) times daily. Taking sugar free 2-3 times daily.   Yes Historical Provider, MD  spironolactone (ALDACTONE) 25 MG tablet Take 25 mg by mouth daily.   Yes Historical Provider, MD  valsartan-hydrochlorothiazide (DIOVAN HCT) 160-25 MG tablet Take 1 tablet by mouth daily. 07/07/16  Yes Jaynee Eagles, PA-C  amLODipine (NORVASC) 5 MG tablet Take 1 tablet (5 mg total) by mouth daily. Patient not taking: Reported on 08/25/2016 07/07/16   Jaynee Eagles, PA-C    HYDROcodone-homatropine [facility]) 5-1.5 MG/5ML syrup Take 5 mLs by mouth at bedtime as needed. Patient not taking: Reported on 08/25/2016 07/07/16   Jaynee Eagles, PA-C     Allergies  Allergen Reactions  . Bystolic [Nebivolol Hcl] Anaphylaxis, Itching and Swelling  . Biaxin [Clarithromycin]     The category of the Biaxin family.  Also allergic to water chestnuts.       Objective:  Physical Exam  Constitutional: She is oriented to person, place, and time. She appears well-developed and well-nourished.  HENT:  Head: Normocephalic.    Right Ear: External ear normal.  Left Ear: External ear normal.  Eyes: Conjunctivae are normal. Pupils are equal, round, and reactive to light.  Neck: Normal range of motion. Neck supple.  Cardiovascular: Normal rate, regular rhythm, normal heart sounds and intact distal pulses.   Pulmonary/Chest: Effort normal and breath sounds normal.  Musculoskeletal: Normal range of motion.  Neurological: She is alert and oriented to person, place, and time.  Skin: Skin is warm and dry.  Psychiatric: She has a normal mood and affect. Her behavior is normal. Judgment and thought content normal.   Vitals:   08/25/16 1341  BP: 118/80  Pulse: 71  Resp: 17  Temp: 98.8 F (37.1 C)     Assessment & Plan:  1. Abscess of head, non-indurated. I&D wound likely be unsuccessful. Will treat empirically with antibiotic to clear infection.  . cephALEXin (KEFLEX) 500 MG capsule    Sig: Take 2 capsules (1,000 mg total) by mouth 2 (two) times daily.    Dispense:  20 capsule   Carroll Sage. Kenton Kingfisher, MSN, FNP-C [facility]

## 2016-08-25 NOTE — Patient Instructions (Addendum)
Apply warm compresses. Take antibiotic as prescribed.    IF you received an x-ray today, you will receive an invoice from [facility]. Please contact [facility] at [phone] with questions or concerns regarding your invoice.   IF you received labwork today, you will receive an invoice from [employer]. Please contact [facility] at [phone] with questions or concerns regarding your invoice.   Our billing staff will not be able to assist you with questions regarding bills from these companies.  You will be contacted with the lab results as soon as they are available. The fastest way to get your results is to activate your My Chart account. Instructions are located on the last page of this paperwork. If you have not heard from [place] regarding the results in 2 weeks, please contact this office.

## 2016-10-21 ENCOUNTER — Other Ambulatory Visit (HOSPITAL_COMMUNITY): Payer: BC Managed Care – PPO

## 2016-10-21 ENCOUNTER — Ambulatory Visit (HOSPITAL_COMMUNITY): Payer: BC Managed Care – PPO

## 2016-11-20 ENCOUNTER — Ambulatory Visit (INDEPENDENT_AMBULATORY_CARE_PROVIDER_SITE_OTHER): Payer: BC Managed Care – PPO | Admitting: Family Medicine

## 2016-11-20 VITALS — BP 122/78 | HR 74 | Temp 98.6°F | Resp 16 | Ht 65.5 in | Wt 195.8 lb

## 2016-11-20 DIAGNOSIS — M7989 Other specified soft tissue disorders: Secondary | ICD-10-CM

## 2016-11-20 DIAGNOSIS — N3001 Acute cystitis with hematuria: Secondary | ICD-10-CM

## 2016-11-20 DIAGNOSIS — I10 Essential (primary) hypertension: Secondary | ICD-10-CM

## 2016-11-20 LAB — POC MICROSCOPIC URINALYSIS (UMFC): MUCUS RE: ABSENT

## 2016-11-20 LAB — POCT URINALYSIS DIP (MANUAL ENTRY)
BILIRUBIN UA: NEGATIVE
Bilirubin, UA: NEGATIVE
Glucose, UA: NEGATIVE
Nitrite, UA: POSITIVE — AB
PH UA: 6
SPEC GRAV UA: 1.02
Urobilinogen, UA: 0.2

## 2016-11-20 MED ORDER — CEPHALEXIN 500 MG PO CAPS: 500.0000 mg | ORAL_CAPSULE | Freq: Two times a day (BID) | ORAL | 0 refills | Status: DC

## 2016-11-20 MED ORDER — VALSARTAN-HYDROCHLOROTHIAZIDE 160-25 MG PO TABS: 1.0000 | ORAL_TABLET | Freq: Every day | ORAL | 3 refills | Status: DC

## 2016-11-20 MED ORDER — HYDROCHLOROTHIAZIDE 25 MG PO TABS: 25.0000 mg | ORAL_TABLET | Freq: Every day | ORAL | 1 refills | Status: DC

## 2016-11-20 NOTE — Progress Notes (Signed)
Patient ID: Heidi Herrera, female    DOB: [birth year], 50 y.o.   MRN: [id number]  PCP: Molli Barrows, FNP  Chief Complaint  Patient presents with  . burning while urinating    x1 day; no odor  . other    having problems with BP medication causing other sxs like swelling  . Leg Swelling    right leg; painful    Subjective:   HPI 50 year old female presents for evaluation of dysuria, hypertension, and right leg pain. She  reports that she has uterine fibroids which is being management by gynecology.  Dysuria  Burning with urination started yesterday. Increased frequency and urgency with lower abdominal pressure. Urine color is dark.  Lower Extremity Swelling  Pt reports recently being placed on Spirolactone and has noticed a significant increase in ankle swelling. Reports previously she had extremely significant bilateral leg swelling while taking amlodipine. Since stopping amlodipine, and starting Spirolactone, swelling has shifted from her legs to ankles. She also complains of hair loss and thinning since taking Spirolactone. Monitor blood pressure at home and reports readings around 122/78.  Social History   Social History  . Marital status: Divorced    Spouse name: N/A  . Number of children: N/A  . Years of education: N/A   Occupational History  . Not on file.   Social History Main Topics  . Smoking status: Never Smoker  . Smokeless tobacco: Never Used  . Alcohol use Yes  . Drug use: No  . Sexual activity: Not on file   Other Topics Concern  . Not on file   Social History Narrative  . No narrative on file    Family History  Problem Relation Age of Onset  . Hypertension Mother   . Obesity Mother   . Stroke Mother   . Diabetes Mother   . Hypertension Sister   . Hyperlipidemia Sister   . Obesity Sister   . Diabetes Sister   . Heart disease Sister   . Mental illness Sister   . Hypertension Brother   . Obesity Brother   . Stroke Brother   . Mental  illness Father   . Hypertension Father   . Cancer Brother   . Stroke Brother   . Diabetes Sister   . Hypertension Sister   . Mental illness Sister     Review of Systems See HPI Patient Active Problem List   Diagnosis Date Noted  . Fibroid, uterine   . Essential hypertension 09/29/2014  . Anemia 02/02/2014  . VITAMIN D DEFICIENCY 12/18/2010  . HYPERLIPIDEMIA 12/18/2010  . CHEST PAIN 12/18/2010  . NEPHROLITHIASIS 03/24/2010  . 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 GRANULOMA 10/09/2009  . ESOPHAGEAL STRICTURE 08/27/2009  . GERD 08/27/2009  . Anxiety state 04/20/2008  . HYPERTENSION 04/20/2008  . OBESITY, NOS 02/10/2007  . HYPERTENSION, BENIGN SYSTEMIC 02/10/2007  . FIBROADENOSIS, BREAST 02/10/2007     Prior to Admission medications   Medication Sig Start Date End Date Taking? Authorizing Provider  Ascorbic Acid (VITAMIN C) 1000 MG tablet Take 2,000 mg by mouth 2 (two) times daily.    Yes Historical Provider, MD  Biotin 1000 MCG tablet Take 1,000 mcg by mouth once.   Yes Historical Provider, MD  calcium-vitamin D (OSCAL WITH D) 500-200 MG-UNIT per tablet Take 1 tablet by mouth 2 (two) times daily. Calcium 1000 mg with Magnesium 400 mg and zinc 15 mg, taking 1 capsule 2 times daily   Yes Historical Provider, MD  cetirizine (ZYRTEC)  10 MG tablet Take 1 tablet (10 mg total) by mouth daily. 02/13/16  Yes Posey Boyer, MD  co-enzyme Q-10 30 MG capsule Take 200 mg by mouth daily.   Yes Historical Provider, MD  Cyanocobalamin (VITAMIN B 12 PO) Take 1 tablet by mouth daily.   Yes Historical Provider, MD  HYDROcodone-homatropine (HYCODAN) 5-1.5 MG/5ML syrup Take 5 mLs by mouth at bedtime as needed. 07/07/16  Yes Jaynee Eagles, PA-C  iron polysaccharides (NIFEREX) 150 MG capsule Take 150 mg by mouth daily.   Yes Historical Provider, MD  Multiple Vitamins-Minerals (ALIVE ONCE DAILY WOMENS 50+ PO) Take 1 tablet by mouth daily.   Yes Historical Provider, MD  Omega 3-6-9 Fatty Acids (OMEGA-3-6-9 PO)  Take 1 capsule by mouth daily.   Yes Historical Provider, MD  psyllium (METAMUCIL) 58.6 % powder Take 1 packet by mouth 3 (three) times daily. Taking sugar free 2-3 times daily.   Yes Historical Provider, MD  spironolactone (ALDACTONE) 25 MG tablet Take 25 mg by mouth daily.   Yes Historical Provider, MD  valsartan-hydrochlorothiazide (DIOVAN HCT) 160-25 MG tablet Take 1 tablet by mouth daily. 07/07/16  Yes Jaynee Eagles, PA-C  amLODipine (NORVASC) 5 MG tablet Take 1 tablet (5 mg total) by mouth daily. Patient not taking: Reported on 11/20/2016 07/07/16   Jaynee Eagles, PA-C     Allergies  Allergen Reactions  . Bystolic [Nebivolol Hcl] Anaphylaxis, Itching and Swelling  . Biaxin [Clarithromycin]     The category of the Biaxin family.  Also allergic to water chestnuts.       Objective:  Physical Exam  Constitutional: She is oriented to person, place, and time. She appears well-developed and well-nourished.  HENT:  Head: Normocephalic and atraumatic.  Mouth/Throat: Oropharynx is clear and moist.  Eyes: Conjunctivae and EOM are normal. Pupils are equal, round, and reactive to light.  Neck: Normal range of motion. Neck supple.  Cardiovascular: Normal rate, regular rhythm, normal heart sounds and intact distal pulses.   Pulmonary/Chest: Effort normal and breath sounds normal.  Neurological: She is alert and oriented to person, place, and time. She has normal reflexes.  Skin: Skin is warm and dry.  Psychiatric: She has a normal mood and affect. Her behavior is normal. Judgment and thought content normal.     Vitals:   11/20/16 1204  BP: 122/78  Pulse: 74  Resp: 16  Temp: 98.6 F (37 C)   Assessment & Plan:  1. Acute cystitis with hematuria - POCT Microscopic Urinalysis ([facility]) - POCT urinalysis dipstick - Urine culture  Plan: Cephalexin (Keflex) 500 mg twice daily x 10 days.  2. Hypertension, unspecified type - CMP14+EGFR - CBC with Differential - Thyroid Panel With TSH - Lipid  panel  Plan: Continue Valsartan-Hydrochlorothiazide 160-25 mg once daily Continue to monitor blood pressure readings Discontinue Spirolactone   3. Swelling of left lower extremity Plan: Add hydrochlorothiazide (Hydrodiuril) 25 mg once daily to relieve leg swelling.  You will be notified regarding your lab results.  Carroll Sage. Kenton Kingfisher, MSN, FNP-C [facility]

## 2016-11-20 NOTE — Patient Instructions (Addendum)
Urinary Tract Infection: Take Keflex 500 mg twice daily for 10 days.  Hypertension and Bilateral Leg Edema:  Valsartan-HCTZ 160 mg/25 mg continue and add 25 mg of HCTZ daily.  Stop the Spirolactone.  Continue to monitor blood pressure daily and return in 3 months for follow-up.    IF you received an x-ray today, you will receive an invoice from [facility]. Please contact [facility] at [phone] with questions or concerns regarding your invoice.   IF you received labwork today, you will receive an invoice from [employer]. Please contact [facility] at [phone] with questions or concerns regarding your invoice.   Our billing staff will not be able to assist you with questions regarding bills from these companies.  You will be contacted with the lab results as soon as they are available. The fastest way to get your results is to activate your My Chart account. Instructions are located on the last page of this paperwork. If you have not heard from [place] regarding the results in 2 weeks, please contact this office.

## 2016-11-22 LAB — CBC WITH DIFFERENTIAL/PLATELET
Basophils Absolute: 0 10*3/uL (ref 0.0–0.2)
Basos: 0 %
EOS (ABSOLUTE): 0.1 10*3/uL (ref 0.0–0.4)
EOS: 2 %
HEMOGLOBIN: 9.2 g/dL — AB (ref 11.1–15.9)
Hematocrit: 28.8 % — ABNORMAL LOW (ref 34.0–46.6)
IMMATURE GRANULOCYTES: 0 %
Immature Grans (Abs): 0 10*3/uL (ref 0.0–0.1)
Lymphocytes Absolute: 1.4 10*3/uL (ref 0.7–3.1)
Lymphs: 23 %
MCH: 26.7 pg (ref 26.6–33.0)
MCHC: 31.9 g/dL (ref 31.5–35.7)
MCV: 84 fL (ref 79–97)
MONOCYTES: 8 %
Monocytes Absolute: 0.5 10*3/uL (ref 0.1–0.9)
Neutrophils Absolute: 4.2 10*3/uL (ref 1.4–7.0)
Neutrophils: 67 %
Platelets: 351 10*3/uL (ref 150–379)
RBC: 3.44 x10E6/uL — AB (ref 3.77–5.28)
RDW: 15.9 % — ABNORMAL HIGH (ref 12.3–15.4)
WBC: 6.2 10*3/uL (ref 3.4–10.8)

## 2016-11-22 LAB — CMP14+EGFR
ALBUMIN: 4.1 g/dL (ref 3.5–5.5)
ALT: 11 IU/L (ref 0–32)
AST: 15 IU/L (ref 0–40)
Albumin/Globulin Ratio: 1.1 — ABNORMAL LOW (ref 1.2–2.2)
Alkaline Phosphatase: 51 IU/L (ref 39–117)
BUN / CREAT RATIO: 14 (ref 9–23)
BUN: 17 mg/dL (ref 6–24)
Bilirubin Total: <0.2 mg/dL (ref 0.0–1.2)
CO2: 26 mmol/L (ref 18–29)
CREATININE: 1.21 mg/dL — AB (ref 0.57–1.00)
Calcium: 8.8 mg/dL (ref 8.7–10.2)
Chloride: 99 mmol/L (ref 96–106)
GFR calc Af Amer: 59 mL/min/{1.73_m2} — ABNORMAL LOW (ref >59)
GFR calc non Af Amer: 52 mL/min/{1.73_m2} — ABNORMAL LOW (ref >59)
Globulin, Total: 3.6 g/dL (ref 1.5–4.5)
Glucose: 82 mg/dL (ref 65–99)
Potassium: 3.8 mmol/L (ref 3.5–5.2)
Sodium: 141 mmol/L (ref 134–144)
TOTAL PROTEIN: 7.7 g/dL (ref 6.0–8.5)

## 2016-11-22 LAB — THYROID PANEL WITH TSH
FREE THYROXINE INDEX: 2 (ref 1.2–4.9)
T3 UPTAKE RATIO: 30 % (ref 24–39)
T4 TOTAL: 6.8 ug/dL (ref 4.5–12.0)
TSH: 2.07 u[IU]/mL (ref 0.450–4.500)

## 2016-11-22 LAB — LIPID PANEL
CHOL/HDL RATIO: 3.5 ratio (ref 0.0–4.4)
Cholesterol, Total: 156 mg/dL (ref 100–199)
HDL: 44 mg/dL (ref >39)
LDL CALC: 101 mg/dL — AB (ref 0–99)
TRIGLYCERIDES: 55 mg/dL (ref 0–149)
VLDL CHOLESTEROL CAL: 11 mg/dL (ref 5–40)

## 2016-11-23 LAB — URINE CULTURE

## 2016-11-25 MED ORDER — FERROUS SULFATE 325 (65 FE) MG PO TABS
325.0000 mg | ORAL_TABLET | Freq: Every day | ORAL | 3 refills | Status: DC
Start: 2016-11-25 — End: 2016-12-01

## 2016-11-27 ENCOUNTER — Other Ambulatory Visit: Payer: Self-pay | Admitting: Student

## 2016-11-30 ENCOUNTER — Other Ambulatory Visit: Payer: Self-pay | Admitting: Student

## 2016-12-01 ENCOUNTER — Encounter (HOSPITAL_COMMUNITY): Payer: Self-pay

## 2016-12-01 ENCOUNTER — Observation Stay (HOSPITAL_COMMUNITY)
Admission: RE | Admit: 2016-12-01 | Discharge: 2016-12-02 | Disposition: A | Discharge status: Home / Self Care | Payer: BC Managed Care – PPO | Source: Ambulatory Visit | Attending: Interventional Radiology | Admitting: Interventional Radiology

## 2016-12-01 ENCOUNTER — Ambulatory Visit (HOSPITAL_COMMUNITY)
Admission: RE | Admit: 2016-12-01 | Discharge: 2016-12-01 | Disposition: A | Discharge status: Home / Self Care | Payer: BC Managed Care – PPO | Source: Ambulatory Visit | Attending: Interventional Radiology | Admitting: Interventional Radiology

## 2016-12-01 DIAGNOSIS — E669 Obesity, unspecified: Secondary | ICD-10-CM | POA: Insufficient documentation

## 2016-12-01 DIAGNOSIS — D649 Anemia, unspecified: Secondary | ICD-10-CM | POA: Diagnosis not present

## 2016-12-01 DIAGNOSIS — R351 Nocturia: Secondary | ICD-10-CM | POA: Diagnosis not present

## 2016-12-01 DIAGNOSIS — I10 Essential (primary) hypertension: Secondary | ICD-10-CM | POA: Insufficient documentation

## 2016-12-01 DIAGNOSIS — Z6832 Body mass index (BMI) 32.0-32.9, adult: Secondary | ICD-10-CM | POA: Diagnosis not present

## 2016-12-01 DIAGNOSIS — D219 Benign neoplasm of connective and other soft tissue, unspecified: Secondary | ICD-10-CM | POA: Diagnosis present

## 2016-12-01 DIAGNOSIS — N920 Excessive and frequent menstruation with regular cycle: Secondary | ICD-10-CM | POA: Diagnosis not present

## 2016-12-01 DIAGNOSIS — D259 Leiomyoma of uterus, unspecified: Principal | ICD-10-CM | POA: Insufficient documentation

## 2016-12-01 DIAGNOSIS — R350 Frequency of micturition: Secondary | ICD-10-CM | POA: Insufficient documentation

## 2016-12-01 HISTORY — PX: IR GENERIC HISTORICAL: IMG1180011

## 2016-12-01 LAB — URINALYSIS, ROUTINE W REFLEX MICROSCOPIC
Bilirubin Urine: NEGATIVE
GLUCOSE, UA: NEGATIVE mg/dL
Hgb urine dipstick: NEGATIVE
Ketones, ur: NEGATIVE mg/dL
LEUKOCYTES UA: NEGATIVE
NITRITE: NEGATIVE
PROTEIN: NEGATIVE mg/dL
Specific Gravity, Urine: 1.014 (ref 1.005–1.030)
pH: 5 (ref 5.0–8.0)

## 2016-12-01 LAB — CBC WITH DIFFERENTIAL/PLATELET
Basophils Absolute: 0 10*3/uL (ref 0.0–0.1)
Basophils Relative: 0 %
Eosinophils Absolute: 0.1 10*3/uL (ref 0.0–0.7)
Eosinophils Relative: 2 %
HEMATOCRIT: 29.9 % — AB (ref 36.0–46.0)
Hemoglobin: 10 g/dL — ABNORMAL LOW (ref 12.0–15.0)
LYMPHS PCT: 34 %
Lymphs Abs: 1.5 10*3/uL (ref 0.7–4.0)
MCH: 26.7 pg (ref 26.0–34.0)
MCHC: 33.4 g/dL (ref 30.0–36.0)
MCV: 79.7 fL (ref 78.0–100.0)
MONO ABS: 0.3 10*3/uL (ref 0.1–1.0)
MONOS PCT: 6 %
NEUTROS ABS: 2.7 10*3/uL (ref 1.7–7.7)
Neutrophils Relative %: 58 %
Platelets: 393 10*3/uL (ref 150–400)
RBC: 3.75 MIL/uL — ABNORMAL LOW (ref 3.87–5.11)
RDW: 15.5 % (ref 11.5–15.5)
WBC: 4.6 10*3/uL (ref 4.0–10.5)

## 2016-12-01 LAB — BASIC METABOLIC PANEL
ANION GAP: 7 (ref 5–15)
BUN: 14 mg/dL (ref 6–20)
CALCIUM: 9.2 mg/dL (ref 8.9–10.3)
CO2: 25 mmol/L (ref 22–32)
CREATININE: 1.26 mg/dL — AB (ref 0.44–1.00)
Chloride: 110 mmol/L (ref 101–111)
GFR, EST AFRICAN AMERICAN: 56 mL/min — AB (ref >60)
GFR, EST NON AFRICAN AMERICAN: 48 mL/min — AB (ref >60)
Glucose, Bld: 84 mg/dL (ref 65–99)
Potassium: 3.7 mmol/L (ref 3.5–5.1)
SODIUM: 142 mmol/L (ref 135–145)

## 2016-12-01 LAB — HCG, SERUM, QUALITATIVE: Preg, Serum: NEGATIVE

## 2016-12-01 LAB — PROTIME-INR
INR: 1.04
Prothrombin Time: 13.7 seconds (ref 11.4–15.2)

## 2016-12-01 MED ORDER — HYDROMORPHONE 1 MG/ML IV SOLN
INTRAVENOUS | Status: DC
  Administered 2016-12-01: 3.6 mg via INTRAVENOUS
  Administered 2016-12-01: 2.7 mg via INTRAVENOUS
  Administered 2016-12-02: 1.5 mg via INTRAVENOUS
  Administered 2016-12-02: 0.9 mg via INTRAVENOUS
  Administered 2016-12-02: 1.2 mg via INTRAVENOUS

## 2016-12-01 MED ORDER — VALSARTAN-HYDROCHLOROTHIAZIDE 160-25 MG PO TABS: 1.0000 | ORAL_TABLET | Freq: Every day | ORAL | Status: DC

## 2016-12-01 MED ORDER — FENTANYL CITRATE (PF) 100 MCG/2ML IJ SOLN
INTRAMUSCULAR | Status: AC
  Filled 2016-12-01: qty 6

## 2016-12-01 MED ORDER — HYDROCHLOROTHIAZIDE 50 MG PO TABS
50.0000 mg | ORAL_TABLET | Freq: Every day | ORAL | Status: DC
  Administered 2016-12-02: 50 mg via ORAL
  Filled 2016-12-01: qty 2
  Filled 2016-12-01: qty 1

## 2016-12-01 MED ORDER — IOPAMIDOL (ISOVUE-300) INJECTION 61%: 100.0000 mL | Freq: Once | INTRAVENOUS | Status: DC | PRN

## 2016-12-01 MED ORDER — MIDAZOLAM HCL 2 MG/2ML IJ SOLN
INTRAMUSCULAR | Status: AC
  Filled 2016-12-01: qty 6

## 2016-12-01 MED ORDER — DIPHENHYDRAMINE HCL 12.5 MG/5ML PO ELIX: 12.5000 mg | ORAL_SOLUTION | Freq: Four times a day (QID) | ORAL | Status: DC | PRN

## 2016-12-01 MED ORDER — KETOROLAC TROMETHAMINE 30 MG/ML IJ SOLN
30.0000 mg | INTRAMUSCULAR | Status: AC
  Administered 2016-12-01: 30 mg via INTRAVENOUS
  Filled 2016-12-01: qty 1

## 2016-12-01 MED ORDER — LIDOCAINE HCL 1 % IJ SOLN
INTRAMUSCULAR | Status: AC | PRN
  Administered 2016-12-01: 5 mL via INTRADERMAL

## 2016-12-01 MED ORDER — IOPAMIDOL (ISOVUE-300) INJECTION 61%
INTRAVENOUS | Status: AC
  Filled 2016-12-01: qty 100

## 2016-12-01 MED ORDER — MIDAZOLAM HCL 2 MG/2ML IJ SOLN
INTRAMUSCULAR | Status: AC | PRN
  Administered 2016-12-01: 0.5 mg via INTRAVENOUS
  Administered 2016-12-01 (×2): 1 mg via INTRAVENOUS
  Administered 2016-12-01 (×2): 0.5 mg via INTRAVENOUS
  Administered 2016-12-01: 1 mg via INTRAVENOUS

## 2016-12-01 MED ORDER — DOCUSATE SODIUM 100 MG PO CAPS
100.0000 mg | ORAL_CAPSULE | Freq: Two times a day (BID) | ORAL | Status: DC
  Administered 2016-12-02: 100 mg via ORAL
  Filled 2016-12-01 (×2): qty 1

## 2016-12-01 MED ORDER — NALOXONE HCL 0.4 MG/ML IJ SOLN: 0.4000 mg | INTRAMUSCULAR | Status: DC | PRN

## 2016-12-01 MED ORDER — HYDROMORPHONE 1 MG/ML IV SOLN
INTRAVENOUS | Status: AC
  Filled 2016-12-01: qty 25

## 2016-12-01 MED ORDER — SODIUM CHLORIDE 0.9% FLUSH: 3.0000 mL | INTRAVENOUS | Status: DC | PRN

## 2016-12-01 MED ORDER — DEXAMETHASONE SODIUM PHOSPHATE 10 MG/ML IJ SOLN
10.0000 mg | Freq: Once | INTRAMUSCULAR | Status: AC
  Administered 2016-12-01: 10 mg via INTRAVENOUS
  Filled 2016-12-01: qty 1

## 2016-12-01 MED ORDER — SODIUM CHLORIDE 0.9 % IV SOLN
250.0000 mL | INTRAVENOUS | Status: DC | PRN
Start: 2016-12-01 — End: 2016-12-01
  Administered 2016-12-01: 250 mL via INTRAVENOUS

## 2016-12-01 MED ORDER — HYDROCHLOROTHIAZIDE 25 MG PO TABS: 25.0000 mg | ORAL_TABLET | Freq: Every day | ORAL | Status: DC

## 2016-12-01 MED ORDER — IRBESARTAN 150 MG PO TABS
150.0000 mg | ORAL_TABLET | Freq: Every day | ORAL | Status: DC
  Administered 2016-12-02: 150 mg via ORAL
  Filled 2016-12-01: qty 1

## 2016-12-01 MED ORDER — SODIUM CHLORIDE 0.9 % IV SOLN
INTRAVENOUS | Status: DC
  Administered 2016-12-02: 1000 mL via INTRAVENOUS

## 2016-12-01 MED ORDER — KETOROLAC TROMETHAMINE 30 MG/ML IJ SOLN: 30.0000 mg | Freq: Four times a day (QID) | INTRAMUSCULAR | Status: DC

## 2016-12-01 MED ORDER — LIDOCAINE HCL 1 % IJ SOLN
INTRAMUSCULAR | Status: AC
  Filled 2016-12-01: qty 20

## 2016-12-01 MED ORDER — SODIUM CHLORIDE 0.9 % IV SOLN
INTRAVENOUS | Status: DC
  Administered 2016-12-01: 12:00:00 via INTRAVENOUS

## 2016-12-01 MED ORDER — SODIUM CHLORIDE 0.9 % IV SOLN: 250.0000 mL | INTRAVENOUS | Status: DC | PRN

## 2016-12-01 MED ORDER — IOPAMIDOL (ISOVUE-300) INJECTION 61%
INTRAVENOUS | Status: AC
  Filled 2016-12-01: qty 50

## 2016-12-01 MED ORDER — CEPHALEXIN 500 MG PO CAPS: 500.0000 mg | ORAL_CAPSULE | Freq: Two times a day (BID) | ORAL | Status: DC

## 2016-12-01 MED ORDER — SODIUM CHLORIDE 0.9% FLUSH: 3.0000 mL | Freq: Two times a day (BID) | INTRAVENOUS | Status: DC

## 2016-12-01 MED ORDER — KETOROLAC TROMETHAMINE 30 MG/ML IJ SOLN
30.0000 mg | Freq: Four times a day (QID) | INTRAMUSCULAR | Status: DC
  Administered 2016-12-01 – 2016-12-02 (×4): 30 mg via INTRAVENOUS
  Filled 2016-12-01 (×4): qty 1

## 2016-12-01 MED ORDER — HYDROMORPHONE HCL 1 MG/ML IJ SOLN
INTRAMUSCULAR | Status: AC | PRN
  Administered 2016-12-01: 1 mg via INTRAVENOUS

## 2016-12-01 MED ORDER — DIPHENHYDRAMINE HCL 50 MG/ML IJ SOLN: 12.5000 mg | Freq: Four times a day (QID) | INTRAMUSCULAR | Status: DC | PRN

## 2016-12-01 MED ORDER — FENTANYL CITRATE (PF) 100 MCG/2ML IJ SOLN
INTRAMUSCULAR | Status: AC | PRN
  Administered 2016-12-01 (×4): 50 ug via INTRAVENOUS

## 2016-12-01 MED ORDER — SODIUM CHLORIDE 0.9% FLUSH: 9.0000 mL | INTRAVENOUS | Status: DC | PRN

## 2016-12-01 MED ORDER — ONDANSETRON HCL 4 MG/2ML IJ SOLN
4.0000 mg | Freq: Four times a day (QID) | INTRAMUSCULAR | Status: DC | PRN
  Administered 2016-12-01 – 2016-12-02 (×2): 4 mg via INTRAVENOUS
  Filled 2016-12-01 (×2): qty 2

## 2016-12-01 MED ORDER — HYDROMORPHONE HCL 2 MG/ML IJ SOLN
INTRAMUSCULAR | Status: AC
  Filled 2016-12-01: qty 1

## 2016-12-01 MED ORDER — IOPAMIDOL (ISOVUE-300) INJECTION 61%: 50.0000 mL | Freq: Once | INTRAVENOUS | Status: DC | PRN

## 2016-12-01 MED ORDER — CEFAZOLIN SODIUM-DEXTROSE 2-4 GM/100ML-% IV SOLN
2.0000 g | INTRAVENOUS | Status: DC
  Filled 2016-12-01: qty 100

## 2016-12-01 NOTE — Procedures (Signed)
Post bilateral uterine artery embolization.   No immediate post procedural complications.   EBL: None Keep right leg straight for 4 hrs.    SignedSandi Mariscal [phone] 12/01/2016, 3:46 PM

## 2016-12-01 NOTE — H&P (Signed)
Referring Physician(s): Haygood,V  Supervising Physician: Sandi Mariscal  Patient Status:  [facility] OP  Chief Complaint:  Symptomatic uterine fibroids  Subjective: Patient familiar to IR service from prior consultations with Dr. Barbie Banner in 2006 and Dr. Pascal Lux earlier this year for treatment options regarding symptomatic uterine fibroids. She was deemed an appropriate candidate for bilateral uterine artery embolization and presents today for the procedure. She currently denies fever, headache, chest pain, dyspnea, abdominal pain, back pain, nausea, dysuria/hematuria or vomiting. She does have menorrhagia with intermittent pelvic cramping, frequent urination/nocturia and abdominal bloating. She has recently completed treatment for E coli UTI and clinically improved from recent head cold.  Past Medical History:  Diagnosis Date  . Acid reflux   . Allergy   . Anemia   . Anxiety   . GERD (gastroesophageal reflux disease)   . Hypertension   . Obesity   . Sebaceous cyst    Past Surgical History:  Procedure Laterality Date  . LOBECTOMY  2009  . MYOMECTOMY  2007     Allergies: Bystolic [nebivolol hcl] and Biaxin [clarithromycin]  Medications: Prior to Admission medications   Medication Sig Start Date End Date Taking? Authorizing Provider  Ascorbic Acid (VITAMIN C) 1000 MG tablet Take 2,000 mg by mouth 2 (two) times daily.    Yes Historical Provider, MD  Biotin 1000 MCG tablet Take 1,000 mcg by mouth once.   Yes Historical Provider, MD  calcium-vitamin D (OSCAL WITH D) 500-200 MG-UNIT per tablet Take 1 tablet by mouth 2 (two) times daily. Calcium 1000 mg with Magnesium 400 mg and zinc 15 mg, taking 1 capsule 2 times daily   Yes Historical Provider, MD  cetirizine (ZYRTEC) 10 MG tablet Take 1 tablet (10 mg total) by mouth daily. 02/13/16  Yes Posey Boyer, MD  Cyanocobalamin (VITAMIN B 12 PO) Take 1 tablet by mouth daily.   Yes Historical Provider, MD  hydrochlorothiazide (HYDRODIURIL) 25 MG  tablet Take 1 tablet (25 mg total) by mouth daily. 11/20/16  Yes Sedalia Muta, FNP  iron polysaccharides (NIFEREX) 150 MG capsule Take 150 mg by mouth daily.   Yes Historical Provider, MD  Multiple Vitamins-Minerals (ALIVE ONCE DAILY WOMENS 50+ PO) Take 1 tablet by mouth daily.   Yes Historical Provider, MD  valsartan-hydrochlorothiazide (DIOVAN HCT) 160-25 MG tablet Take 1 tablet by mouth daily. 11/20/16  Yes Sedalia Muta, FNP  cephALEXin (KEFLEX) 500 MG capsule Take 1 capsule (500 mg total) by mouth 2 (two) times daily. 11/20/16   Sedalia Muta, FNP  co-enzyme Q-10 30 MG capsule Take 200 mg by mouth daily.    Historical Provider, MD  ferrous sulfate (FERROUSUL) 325 (65 FE) MG tablet Take 1 tablet (325 mg total) by mouth daily with breakfast. 11/25/16   Sedalia Muta, FNP  HYDROcodone-homatropine [facility]) 5-1.5 MG/5ML syrup Take 5 mLs by mouth at bedtime as needed. 07/07/16   Jaynee Eagles, PA-C  Omega 3-6-9 Fatty Acids (OMEGA-3-6-9 PO) Take 1 capsule by mouth daily.    Historical Provider, MD  psyllium (METAMUCIL) 58.6 % powder Take 1 packet by mouth 3 (three) times daily. Taking sugar free 2-3 times daily.    Historical Provider, MD     Vital Signs: BP 136/84 (BP Location: Right Arm)   Pulse 82   Temp 98.6 F (37 C) (Oral)   Resp 16   Ht 5' 5.5" (1.664 m)   Wt 195 lb 6.4 oz (88.6 kg)   LMP 11/16/2016 (Exact Date)   SpO2  100%   BMI 32.02 kg/m   Physical Exam awake/alert; chest- CTA bilat; heart- RRR; abd- obese, soft,+BS,NT, fibroid uterus; LE- no edema, intact distal pulses  Imaging: No results found.  Labs:  CBC:  Recent Labs  04/04/16 1124 04/28/16 1939 11/20/16 1310 12/01/16 1217  WBC 3.8* 4.1* 6.2 4.6  HGB 10.5* 10.4*  --  10.0*  HCT 30.3* 30.6* 28.8* 29.9*  PLT  --   --  351 393    COAGS:  Recent Labs  12/01/16 1217  INR 1.04    BMP:  Recent Labs  02/13/16 1942 04/04/16 1048 11/20/16 1310 12/01/16 1217    NA 140 142 141 142  K 4.0 4.2 3.8 3.7  CL 105 107 99 110  CO2 27 26 26 25   GLUCOSE 80 80 82 84  BUN 17 14 17 14   CALCIUM 9.3 9.1 8.8 9.2  CREATININE 1.27* 1.17* 1.21* 1.26*  GFRNONAA 49*  --  52* 48*  GFRAA 56*  --  59* 56*    LIVER FUNCTION TESTS:  Recent Labs  02/13/16 1942 11/20/16 1310  BILITOT 0.3 <0.2  AST 14 15  ALT 9 11  ALKPHOS 48 51  PROT 7.7 7.7  ALBUMIN 4.1 4.1    Assessment and Plan: Patient with history of symptomatic uterine fibroids and prior myomectomy 2007. She been evaluated by IR service for treatment options and deemed an appropriate candidate for bilateral uterine artery embolization. She presents today for the above procedure. Details/risks of procedure, including but not limited to, contrast and radiation exposure, vessel injury, infection, nontarget embolization, discussed with patient with her understanding and consent. Post procedure she will be admitted for overnight observation for pain control.   Electronically Signed: D. Rowe Robert 12/01/2016, 12:59 PM   I spent a total of 30 minutes at the the patient's bedside AND on the patient's hospital floor or unit, greater than 50% of which was counseling/coordinating care for bilateral uterine artery embolization

## 2016-12-01 NOTE — Progress Notes (Signed)
No bleeding, hematoma or swelling noted on the right groin. Dressing remained dry and intact . Right pedal pulses positive, feet warm to touch.

## 2016-12-02 ENCOUNTER — Other Ambulatory Visit: Payer: Self-pay | Admitting: Radiology

## 2016-12-02 DIAGNOSIS — D259 Leiomyoma of uterus, unspecified: Secondary | ICD-10-CM

## 2016-12-02 LAB — BASIC METABOLIC PANEL
Anion gap: 8 (ref 5–15)
BUN: 19 mg/dL (ref 6–20)
CALCIUM: 8.6 mg/dL — AB (ref 8.9–10.3)
CO2: 24 mmol/L (ref 22–32)
Chloride: 106 mmol/L (ref 101–111)
Creatinine, Ser: 1.16 mg/dL — ABNORMAL HIGH (ref 0.44–1.00)
GFR calc Af Amer: >60 mL/min (ref >60)
GFR, EST NON AFRICAN AMERICAN: 53 mL/min — AB (ref >60)
GLUCOSE: 135 mg/dL — AB (ref 65–99)
Potassium: 4.7 mmol/L (ref 3.5–5.1)
SODIUM: 138 mmol/L (ref 135–145)

## 2016-12-02 MED ORDER — HYDROCODONE-ACETAMINOPHEN 5-325 MG PO TABS
1.0000 | ORAL_TABLET | ORAL | Status: DC | PRN
  Administered 2016-12-02 (×2): 1 via ORAL
  Filled 2016-12-02 (×2): qty 1

## 2016-12-02 MED ORDER — ONDANSETRON HCL 4 MG/2ML IJ SOLN
4.0000 mg | Freq: Four times a day (QID) | INTRAMUSCULAR | Status: DC | PRN
  Administered 2016-12-02: 4 mg via INTRAVENOUS
  Filled 2016-12-02: qty 2

## 2016-12-02 NOTE — Discharge Summary (Signed)
Patient ID: Heidi Herrera MRN: [id number] DOB/AGE: [birth year] 52 y.o.  Admit date: 12/01/2016 Discharge date: 12/02/2016  Supervising Physician: Sandi Mariscal  Patient Status: [facility]  Admission Diagnoses: Symptomatic uterine fibroids  Discharge Diagnoses: Symptomatic uterine fibroids, status post successful bilateral uterine artery embolization on 12/01/16 Active Problems:   Fibroid  Past Medical History:  Diagnosis Date  . Acid reflux   . Allergy   . Anemia   . Anxiety   . GERD (gastroesophageal reflux disease)   . Hypertension   . Obesity   . Sebaceous cyst    Past Surgical History:  Procedure Laterality Date  . IR GENERIC HISTORICAL  12/01/2016   IR ANGIOGRAM PELVIS SELECTIVE OR SUPRASELECTIVE 12/01/2016 Sandi Mariscal, MD [facility]-INTERV RAD  . IR GENERIC HISTORICAL  12/01/2016   IR US GUIDE VASC ACCESS RIGHT 12/01/2016 Sandi Mariscal, MD [facility]  . IR GENERIC HISTORICAL  12/01/2016   IR EMBO ARTERIAL NOT HEMORR HEMANG INC GUIDE ROADMAPPING 12/01/2016 Sandi Mariscal, MD [facility]  . IR GENERIC HISTORICAL  12/01/2016   IR ANGIOGRAM SELECTIVE EACH ADDITIONAL VESSEL 12/01/2016 Sandi Mariscal, MD [facility]  . IR GENERIC HISTORICAL  12/01/2016   IR ANGIOGRAM SELECTIVE EACH ADDITIONAL VESSEL 12/01/2016 Sandi Mariscal, MD [facility]  . IR GENERIC HISTORICAL  12/01/2016   IR ANGIOGRAM PELVIS SELECTIVE OR SUPRASELECTIVE 12/01/2016 Sandi Mariscal, MD [facility]INTER[facility]  . LOBECTOMY  2009  . MYOMECTOMY  2007     Discharged Condition: good  Hospital Course: Heidi Herrera is a 50 year old female with history of symptomatic uterine fibroids who has been previously evaluated by the IR service for treatment options and deemed an appropriate candidate for bilateral uterine artery embolization. On 12/01/16 she underwent successful bilateral uterine artery embolization via IV conscious sedation. The procedure was performed without immediate complications and she was admitted for overnight  observation for pain control. Overnight the patient did fairly well with exception of intermittent pelvic cramping and some occasional nausea. She was placed on Dilaudid PCA pump and given antiemetics as needed. On the day of discharge she was feeling a little better. Nausea resolved after discontinuation of Dilaudid PCA. She continued to have some mild intermittent pelvic cramping as expected post procedure. She was able to ambulate, void and tolerate her diet without significant difficulty. She was evaluated by Dr. Annamaria Boots and deemed stable for discharge at this time. She was given prescriptions for ibuprofen 600 mg, #20, no refills, 1 tablet every 6 hours for the next 5 days. Colace 100 mg, #20, no refills, 1 tablet twice daily as needed for constipation. Norco 5/325, #30, no refills, 1-2 tablets every 4-6 hours as needed for moderate to severe pain. Zofran 8 mg, #20, no refills, 1 tablet twice daily as needed for nausea. She will be scheduled for follow-up in the IR clinic with Dr. Pascal Lux in 3-4 weeks. She was told to continue her current home medications and to push by mouth fluids especially water. She was told to contact our service in the interim with any additional questions or concerns.  Consults: none  Significant Diagnostic Studies:  Results for orders placed or performed during the hospital encounter of [id number]  Basic metabolic panel  Result Value Ref Range   Sodium 142 135 - 145 mmol/L   Potassium 3.7 3.5 - 5.1 mmol/L   Chloride 110 101 - 111 mmol/L   CO2 25 22 - 32 mmol/L   Glucose, Bld 84 65 - 99 mg/dL   BUN 14 6 -  20 mg/dL   Creatinine, Ser 1.26 (H) 0.44 - 1.00 mg/dL   Calcium 9.2 8.9 - 10.3 mg/dL   GFR calc non Af Amer 48 (L) >60 mL/min   GFR calc Af Amer 56 (L) >60 mL/min   Anion gap 7 5 - 15  CBC with Differential/Platelet  Result Value Ref Range   WBC 4.6 4.0 - 10.5 K/uL   RBC 3.75 (L) 3.87 - 5.11 MIL/uL   Hemoglobin 10.0 (L) 12.0 - 15.0 g/dL   HCT 29.9 (L) 36.0 - 46.0 %    MCV 79.7 78.0 - 100.0 fL   MCH 26.7 26.0 - 34.0 pg   MCHC 33.4 30.0 - 36.0 g/dL   RDW 15.5 11.5 - 15.5 %   Platelets 393 150 - 400 K/uL   Neutrophils Relative % 58 %   Neutro Abs 2.7 1.7 - 7.7 K/uL   Lymphocytes Relative 34 %   Lymphs Abs 1.5 0.7 - 4.0 K/uL   Monocytes Relative 6 %   Monocytes Absolute 0.3 0.1 - 1.0 K/uL   Eosinophils Relative 2 %   Eosinophils Absolute 0.1 0.0 - 0.7 K/uL   Basophils Relative 0 %   Basophils Absolute 0.0 0.0 - 0.1 K/uL  hCG, serum, qualitative  Result Value Ref Range   Preg, Serum NEGATIVE NEGATIVE  Protime-INR  Result Value Ref Range   Prothrombin Time 13.7 11.4 - 15.2 seconds   INR 1.04   Urinalysis, Routine w reflex microscopic  Result Value Ref Range   Color, Urine YELLOW YELLOW   APPearance CLEAR CLEAR   Specific Gravity, Urine 1.014 1.005 - 1.030   pH 5.0 5.0 - 8.0   Glucose, UA NEGATIVE NEGATIVE mg/dL   Hgb urine dipstick NEGATIVE NEGATIVE   Bilirubin Urine NEGATIVE NEGATIVE   Ketones, ur NEGATIVE NEGATIVE mg/dL   Protein, ur NEGATIVE NEGATIVE mg/dL   Nitrite NEGATIVE NEGATIVE   Leukocytes, UA NEGATIVE NEGATIVE  Basic metabolic panel  Result Value Ref Range   Sodium 138 135 - 145 mmol/L   Potassium 4.7 3.5 - 5.1 mmol/L   Chloride 106 101 - 111 mmol/L   CO2 24 22 - 32 mmol/L   Glucose, Bld 135 (H) 65 - 99 mg/dL   BUN 19 6 - 20 mg/dL   Creatinine, Ser 1.16 (H) 0.44 - 1.00 mg/dL   Calcium 8.6 (L) 8.9 - 10.3 mg/dL   GFR calc non Af Amer 53 (L) >60 mL/min   GFR calc Af Amer >60 >60 mL/min   Anion gap 8 5 - 15     Treatments: Successful bilateral uterine artery embolization via IV conscious sedation on 12/01/16  Discharge Exam: Blood pressure 128/78, pulse (!) 59, temperature 97.6 F (36.4 C), temperature source Oral, resp. rate 14, height 5' 5.5" (1.664 m), weight 195 lb 6.4 oz (88.6 kg), last menstrual period 11/16/2016, SpO2 100 %. Patient awake, alert. Chest clear to auscultation bilaterally. Heart with slightly  bradycardic but regular rhythm. Abdomen obese, soft, positive bowel sounds, fibroid uterus, mild pelvic tenderness to palpation. Lower extremities with no significant edema and intact distal pulses.  Disposition:   Discharge Instructions    Call MD for:  difficulty breathing, headache or visual disturbances    Complete by:  As directed    Call MD for:  extreme fatigue    Complete by:  As directed    Call MD for:  hives    Complete by:  As directed    Call MD for:  persistant dizziness or light-headedness  Complete by:  As directed    Call MD for:  persistant nausea and vomiting    Complete by:  As directed    Call MD for:  redness, tenderness, or signs of infection (pain, swelling, redness, odor or green/yellow discharge around incision site)    Complete by:  As directed    Call MD for:  severe uncontrolled pain    Complete by:  As directed    Call MD for:  temperature >100.4    Complete by:  As directed    Change dressing (specify)    Complete by:  As directed    May change dressing over right groin daily for the next 2-3 days and apply Band-Aid to site. May wash site with soap and water.   Diet - low sodium heart healthy    Complete by:  As directed    Driving Restrictions    Complete by:  As directed    No driving for next 48 hours   Increase activity slowly    Complete by:  As directed    Lifting restrictions    Complete by:  As directed    No heavy lifting for the next 3-4 days   May shower / Bathe    Complete by:  As directed    May walk up steps    Complete by:  As directed    Sexual Activity Restrictions    Complete by:  As directed    No sexual intercourse for 1 week     Allergies as of 12/02/2016      Reactions   Bystolic [nebivolol Hcl] Anaphylaxis, Itching, Swelling   Biaxin [clarithromycin]    The category of the Biaxin family.  Also allergic to water chestnuts.   Erythromycin Base Rash      Medication List    STOP taking these medications     cephALEXin 500 MG capsule Commonly known as:  KEFLEX     TAKE these medications   ALIVE ONCE DAILY WOMENS 50+ PO Take 1 tablet by mouth daily.   Biotin 1000 MCG tablet Take 1,000 mcg by mouth daily.   calcium-vitamin D 500-200 MG-UNIT tablet Commonly known as:  OSCAL WITH D Take 1 tablet by mouth 2 (two) times daily. Calcium 1000 mg with Magnesium 400 mg and zinc 15 mg, taking 1 capsule 2 times daily   cetirizine 10 MG tablet Commonly known as:  ZYRTEC Take 1 tablet (10 mg total) by mouth daily.   clobetasol 0.05 % external solution Commonly known as:  TEMOVATE Apply 1 application topically 3 (three) times a week.   co-enzyme Q-10 30 MG capsule Take 200 mg by mouth daily.   hydrochlorothiazide 25 MG tablet Commonly known as:  HYDRODIURIL Take 1 tablet (25 mg total) by mouth daily.   iron polysaccharides 150 MG capsule Commonly known as:  NIFEREX Take 150 mg by mouth daily.   OMEGA-3-6-9 PO Take 1 capsule by mouth daily.   psyllium 58.6 % powder Commonly known as:  METAMUCIL Take 1 packet by mouth daily.   valsartan-hydrochlorothiazide 160-25 MG tablet Commonly known as:  DIOVAN HCT Take 1 tablet by mouth daily.   VITAMIN B 12 PO Take 1 tablet by mouth daily.   vitamin C 1000 MG tablet Take 2,000 mg by mouth 2 (two) times daily.      Follow-up Information    Sandi Mariscal, MD Follow up.   Specialty:  Interventional Radiology Why:  Radiology will call you with follow up appointment  with Dr. Pascal Lux in the IR clinic in 3-4 weeks. Call [phone] or [phone] with any questions. Contact information: [address] [place] [address] [phone]        Eldred Manges, MD Follow up.   Specialty:  Obstetrics and Gynecology Why:  Continue current gynecologic care with Dr. Leo Grosser as scheduled Contact information: [place]. [address]   [id number] [phone]            Electronically Signed: D. Lennette Bihari  Teauna Dubach 12/02/2016, 1:16 PM   I have spent less than 30 minutes discharging Heidi Herrera.

## 2016-12-02 NOTE — Discharge Instructions (Signed)
Uterine Artery Embolization for Fibroids, Care After Refer to this sheet in the next few weeks. These instructions provide you with information on caring for yourself after your procedure. Your health care provider may also give you more specific instructions. Your treatment has been planned according to current medical practices, but problems sometimes occur. Call your health care provider if you have any problems or questions after your procedure. WHAT TO EXPECT AFTER THE PROCEDURE After your procedure, it is typical to have cramping in the pelvis. You will be given pain medicine to control it. HOME CARE INSTRUCTIONS  Only take over-the-counter or prescription medicines for pain, discomfort, or fever as directed by your health care provider.  Do not take aspirin. It can cause bleeding.  Follow your health care provider's advice regarding medicines given to you, diet, activity, and when to begin sexual activity.  See your health care provider for follow-up care as directed. SEEK MEDICAL CARE IF:  You have a fever.  You have redness, swelling, and pain around your incision site.  You have pus draining from your incision.  You have a rash. SEEK IMMEDIATE MEDICAL CARE IF:  You have bleeding from your incision site.  You have difficulty breathing.  You have chest pain.  You have severe abdominal pain.  You have leg pain.  You become dizzy and faint. This information is not intended to replace advice given to you by your health care provider. Make sure you discuss any questions you have with your health care provider. Document Released: 09/20/2013 Document Reviewed: 09/20/2013 Elsevier Interactive Patient Education  2017 [employer].

## 2017-01-07 ENCOUNTER — Ambulatory Visit
Admission: RE | Admit: 2017-01-07 | Discharge: 2017-01-07 | Disposition: A | Discharge status: Home / Self Care | Payer: BC Managed Care – PPO | Source: Ambulatory Visit | Attending: Radiology | Admitting: Radiology

## 2017-01-07 ENCOUNTER — Encounter: Payer: Self-pay | Admitting: *Deleted

## 2017-01-07 DIAGNOSIS — D259 Leiomyoma of uterus, unspecified: Secondary | ICD-10-CM

## 2017-01-07 HISTORY — PX: IR GENERIC HISTORICAL: IMG1180011

## 2017-01-07 NOTE — Progress Notes (Signed)
Referring Physician(s): Haygood,V  Chief Complaint: The patient is seen in follow up today s/p successful bilateral uterine artery embolization on 12/01/16.  History of present illness: Heidi Herrera is a 51 year old female with history of symptomatic uterine fibroids who has been previously evaluated by the IR service for treatment options and deemed an appropriate candidate for bilateral uterine artery embolization. On 12/01/16 she underwent successful bilateral uterine artery embolization via IV conscious sedation. The procedure was performed without immediate complications and she was admitted for overnight observation for pain control.  She was discharged home on 12/02/16. She presents again today for routine outpatient follow-up. Patient reports that she is doing much better since the above procedure. She has had one menstrual cycle since the procedure which was normal. She currently denies fever, headache, chest pain, dyspnea, cough, abdominal/back pain, nausea, vomiting.   Past Medical History:  Diagnosis Date  . Acid reflux   . Allergy   . Anemia   . Anxiety   . GERD (gastroesophageal reflux disease)   . Hypertension   . Obesity   . Sebaceous cyst     Past Surgical History:  Procedure Laterality Date  . IR GENERIC HISTORICAL  12/01/2016   IR ANGIOGRAM PELVIS SELECTIVE OR SUPRASELECTIVE 12/01/2016 Sandi Mariscal, MD [facility]  . IR GENERIC HISTORICAL  12/01/2016   IR US GUIDE VASC ACCESS RIGHT 12/01/2016 Sandi Mariscal, MD [facility]  . IR GENERIC HISTORICAL  12/01/2016   IR EMBO ARTERIAL NOT HEMORR HEMANG INC GUIDE ROADMAPPING 12/01/2016 Sandi Mariscal, MD [facility]  . IR GENERIC HISTORICAL  12/01/2016   IR ANGIOGRAM SELECTIVE EACH ADDITIONAL VESSEL 12/01/2016 Sandi Mariscal, MD [facility]  . IR GENERIC HISTORICAL  12/01/2016   IR ANGIOGRAM SELECTIVE EACH ADDITIONAL VESSEL 12/01/2016 Sandi Mariscal, MD [facility]  . IR GENERIC HISTORICAL  12/01/2016   IR ANGIOGRAM PELVIS  SELECTIVE OR SUPRASELECTIVE 12/01/2016 Sandi Mariscal, MD [facility]  . IR GENERIC HISTORICAL  01/07/2017   IR RADIOLOGIST EVAL & MGMT 01/07/2017 Sandi Mariscal, MD [facility]  . LOBECTOMY  2009  . MYOMECTOMY  2007    Allergies: Bystolic [nebivolol hcl]; Biaxin [clarithromycin]; and Erythromycin base  Medications: Prior to Admission medications   Medication Sig Start Date End Date Taking? Authorizing Provider  Ascorbic Acid (VITAMIN C) 1000 MG tablet Take 2,000 mg by mouth 2 (two) times daily.    Yes Historical Provider, MD  Biotin 1000 MCG tablet Take 1,000 mcg by mouth daily.    Yes Historical Provider, MD  calcium-vitamin D (OSCAL WITH D) 500-200 MG-UNIT per tablet Take 1 tablet by mouth 2 (two) times daily. Calcium 1000 mg with Magnesium 400 mg and zinc 15 mg, taking 1 capsule 2 times daily   Yes Historical Provider, MD  cetirizine (ZYRTEC) 10 MG tablet Take 1 tablet (10 mg total) by mouth daily. 02/13/16  Yes Posey Boyer, MD  clobetasol (TEMOVATE) 0.05 % external solution Apply 1 application topically 3 (three) times a week. 07/06/16  Yes Historical Provider, MD  Cyanocobalamin (VITAMIN B 12 PO) Take 1 tablet by mouth daily.   Yes Historical Provider, MD  hydrochlorothiazide (HYDRODIURIL) 25 MG tablet Take 1 tablet (25 mg total) by mouth daily. 11/20/16  Yes Sedalia Muta, FNP  Multiple Vitamins-Minerals (ALIVE ONCE DAILY WOMENS 50+ PO) Take 1 tablet by mouth daily.   Yes Historical Provider, MD  Omega 3-6-9 Fatty Acids (OMEGA-3-6-9 PO) Take 1 capsule by mouth daily.   Yes Historical Provider, MD  psyllium (METAMUCIL)  58.6 % powder Take 1 packet by mouth daily.    Yes Historical Provider, MD  valsartan-hydrochlorothiazide (DIOVAN HCT) 160-25 MG tablet Take 1 tablet by mouth daily. 11/20/16  Yes Sedalia Muta, FNP  co-enzyme Q-10 30 MG capsule Take 200 mg by mouth daily.    Historical Provider, MD  iron polysaccharides (NIFEREX) 150 MG capsule Take 150 mg by mouth  daily.    Historical Provider, MD     Family History  Problem Relation Age of Onset  . Hypertension Mother   . Obesity Mother   . Stroke Mother   . Diabetes Mother   . Hypertension Sister   . Hyperlipidemia Sister   . Obesity Sister   . Diabetes Sister   . Heart disease Sister   . Mental illness Sister   . Hypertension Brother   . Obesity Brother   . Stroke Brother   . Mental illness Father   . Hypertension Father   . Cancer Brother   . Stroke Brother   . Diabetes Sister   . Hypertension Sister   . Mental illness Sister     Social History   Social History  . Marital status: Divorced    Spouse name: N/A  . Number of children: N/A  . Years of education: N/A   Social History Main Topics  . Smoking status: Never Smoker  . Smokeless tobacco: Never Used  . Alcohol use Yes  . Drug use: No  . Sexual activity: Not on file   Other Topics Concern  . Not on file   Social History Narrative  . No narrative on file     Vital Signs: BP 117/76 (BP Location: Left Arm, Patient Position: Sitting, Cuff Size: Normal)   Pulse 82   Temp 98.4 F (36.9 C) (Oral)   Resp 14   Ht 5' 5.5" (1.664 m)   Wt 188 lb 8 oz (85.5 kg)   LMP 01/01/2017 (Exact Date)   SpO2 98%   BMI 30.89 kg/m   Physical Exam awake, alert. Chest clear to auscultation bilaterally. Heart with regular rate and rhythm. Abdomen soft, positive bowel sounds, nontender. Puncture site right common femoral artery clean, dry, nontender, no hematoma.  Imaging: Ir Radiologist Eval & Mgmt  Result Date: 01/07/2017 Please refer to "Notes" to see consult details.   Labs:  CBC:  Recent Labs  04/04/16 1124 04/28/16 1939 11/20/16 1310 12/01/16 1217  WBC 3.8* 4.1* 6.2 4.6  HGB 10.5* 10.4*  --  10.0*  HCT 30.3* 30.6* 28.8* 29.9*  PLT  --   --  351 393    COAGS:  Recent Labs  12/01/16 1217  INR 1.04    BMP:  Recent Labs  02/13/16 1942 04/04/16 1048 11/20/16 1310 12/01/16 1217 12/02/16 0411  NA  140 142 141 142 138  K 4.0 4.2 3.8 3.7 4.7  CL 105 107 99 110 106  CO2 27 26 26 25 24   GLUCOSE 80 80 82 84 135*  BUN 17 14 17 14 19   CALCIUM 9.3 9.1 8.8 9.2 8.6*  CREATININE 1.27* 1.17* 1.21* 1.26* 1.16*  GFRNONAA 49*  --  52* 48* 53*  GFRAA 56*  --  59* 56* >60    LIVER FUNCTION TESTS:  Recent Labs  02/13/16 1942 11/20/16 1310  BILITOT 0.3 <0.2  AST 14 15  ALT 9 11  ALKPHOS 48 51  PROT 7.7 7.7  ALBUMIN 4.1 4.1    Assessment: Patient with history of symptomatic uterine fibroids, status post  successful bilateral uterine artery embolization on 12/01/16. Patient reports that she is doing much better since the above procedure. She has had one menstrual cycle since procedure which was normal. She denies any significant abdominal/back pain, nausea, vomiting, dysuria, or abnormal vaginal discharge. She is afebrile. Dr. Pascal Lux reviewed latest imaging studies from fibroid embolization with patient. She will be scheduled for follow-up clinic visit in 6 months. She was told to contact our service in the interim with any additional questions or concerns. She will continue current gynecologic care with Dr. Leo Grosser as scheduled.   Signed: D. Rowe Robert 01/07/2017, 1:21 PM   Please refer to Dr. Deniece Portela attestation of this note for management and plan.      Patient ID: Heidi Herrera, female   DOB: 02/02/1966, 51 y.o.   MRN: [id number]

## 2017-03-23 ENCOUNTER — Ambulatory Visit (INDEPENDENT_AMBULATORY_CARE_PROVIDER_SITE_OTHER): Payer: BC Managed Care – PPO | Admitting: Emergency Medicine

## 2017-03-23 ENCOUNTER — Telehealth: Payer: Self-pay | Admitting: Emergency Medicine

## 2017-03-23 VITALS — BP 135/81 | HR 89 | Temp 98.5°F | Resp 16 | Ht 65.0 in | Wt 195.0 lb

## 2017-03-23 DIAGNOSIS — M533 Sacrococcygeal disorders, not elsewhere classified: Secondary | ICD-10-CM

## 2017-03-23 DIAGNOSIS — R05 Cough: Secondary | ICD-10-CM

## 2017-03-23 DIAGNOSIS — R059 Cough, unspecified: Secondary | ICD-10-CM

## 2017-03-23 DIAGNOSIS — J029 Acute pharyngitis, unspecified: Secondary | ICD-10-CM | POA: Diagnosis not present

## 2017-03-23 DIAGNOSIS — J069 Acute upper respiratory infection, unspecified: Secondary | ICD-10-CM | POA: Insufficient documentation

## 2017-03-23 LAB — POCT RAPID STREP A (OFFICE): Rapid Strep A Screen: NEGATIVE

## 2017-03-23 MED ORDER — AMOXICILLIN-POT CLAVULANATE 875-125 MG PO TABS: 1.0000 | ORAL_TABLET | Freq: Two times a day (BID) | ORAL | 0 refills | Status: AC

## 2017-03-23 MED ORDER — PROMETHAZINE-CODEINE 6.25-10 MG/5ML PO SYRP: 5.0000 mL | ORAL_SOLUTION | Freq: Every evening | ORAL | 0 refills | Status: DC | PRN

## 2017-03-23 NOTE — Patient Instructions (Addendum)
Start antibiotic if no better or worse in 48 hours as discussed.    IF you received an x-ray today, you will receive an invoice from [facility]. Please contact [facility] at [phone] with questions or concerns regarding your invoice.   IF you received labwork today, you will receive an invoice from [facility]. Please contact [facility] at [phone] with questions or concerns regarding your invoice.   Our billing staff will not be able to assist you with questions regarding bills from these companies.  You will be contacted with the lab results as soon as they are available. The fastest way to get your results is to activate your My Chart account. Instructions are located on the last page of this paperwork. If you have not heard from [place] regarding the results in 2 weeks, please contact this office.      Pharyngitis Pharyngitis is a sore throat (pharynx). There is redness, pain, and swelling of your throat. Follow these instructions at home:  Drink enough fluids to keep your pee (urine) clear or pale yellow.  Only take medicine as told by your doctor.  You may get sick again if you do not take medicine as told. Finish your medicines, even if you start to feel better.  Do not take aspirin.  Rest.  Rinse your mouth (gargle) with salt water ( tsp of salt per 1 qt of water) every 1-2 hours. This will help the pain.  If you are not at risk for choking, you can suck on hard candy or sore throat lozenges. Contact a doctor if:  You have large, tender lumps on your neck.  You have a rash.  You cough up green, yellow-brown, or bloody spit. Get help right away if:  You have a stiff neck.  You drool or cannot swallow liquids.  You throw up (vomit) or are not able to keep medicine or liquids down.  You have very bad pain that does not go away with medicine.  You have problems breathing (not from a stuffy nose). This information is not intended to replace  advice given to you by your health care provider. Make sure you discuss any questions you have with your health care provider. Document Released: 05/18/2008 Document Revised: 05/07/2016 Document Reviewed: 08/07/2013 Elsevier Interactive Patient Education  2017 [employer].

## 2017-03-23 NOTE — Progress Notes (Signed)
Heidi Herrera 51 y.o.   Chief Complaint  Patient presents with  . Sore Throat  . Back Pain    HISTORY OF PRESENT ILLNESS: This is a 51 y.o. female complaining of sore throat x 2 days and chronic pain to coccyx area without trauma or associated symptoms.  Sore Throat   This is a new problem. The current episode started in the past 7 days (less than 48 hours ago). The problem has been waxing and waning. There has been no fever. The pain is at a severity of 3/10. The pain is mild. Associated symptoms include congestion, coughing and trouble swallowing (painful). Pertinent negatives include no abdominal pain, diarrhea, ear pain, headaches, neck pain, shortness of breath, swollen glands or vomiting. She has had no exposure to strep or mono.     Prior to Admission medications   Medication Sig Start Date End Date Taking? Authorizing Provider  Ascorbic Acid (VITAMIN C) 1000 MG tablet Take 2,000 mg by mouth 2 (two) times daily.    Yes Historical Provider, MD  Biotin 1000 MCG tablet Take 1,000 mcg by mouth daily.    Yes Historical Provider, MD  calcium-vitamin D (OSCAL WITH D) 500-200 MG-UNIT per tablet Take 1 tablet by mouth 2 (two) times daily. Calcium 1000 mg with Magnesium 400 mg and zinc 15 mg, taking 1 capsule 2 times daily   Yes Historical Provider, MD  cetirizine (ZYRTEC) 10 MG tablet Take 1 tablet (10 mg total) by mouth daily. 02/13/16  Yes Posey Boyer, MD  clobetasol (TEMOVATE) 0.05 % external solution Apply 1 application topically 3 (three) times a week. 07/06/16  Yes Historical Provider, MD  co-enzyme Q-10 30 MG capsule Take 200 mg by mouth daily.   Yes Historical Provider, MD  Cyanocobalamin (VITAMIN B 12 PO) Take 1 tablet by mouth daily.   Yes Historical Provider, MD  hydrochlorothiazide (HYDRODIURIL) 25 MG tablet Take 1 tablet (25 mg total) by mouth daily. 11/20/16  Yes Sedalia Muta, FNP  iron polysaccharides (NIFEREX) 150 MG capsule Take 150 mg by mouth daily.   Yes  Historical Provider, MD  Multiple Vitamins-Minerals (ALIVE ONCE DAILY WOMENS 50+ PO) Take 1 tablet by mouth daily.   Yes Historical Provider, MD  Omega 3-6-9 Fatty Acids (OMEGA-3-6-9 PO) Take 1 capsule by mouth daily.   Yes Historical Provider, MD  psyllium (METAMUCIL) 58.6 % powder Take 1 packet by mouth daily.    Yes Historical Provider, MD  valsartan-hydrochlorothiazide (DIOVAN HCT) 160-25 MG tablet Take 1 tablet by mouth daily. 11/20/16  Yes Sedalia Muta, FNP  amoxicillin-clavulanate (AUGMENTIN) 875-125 MG tablet Take 1 tablet by mouth 2 (two) times daily. 03/23/17 03/30/17  Horald Pollen, MD  promethazine-codeine [facility] WITH CODEINE) 6.25-10 MG/5ML syrup Take 5 mLs by mouth at bedtime as needed for cough. 03/23/17   Horald Pollen, MD    Allergies  Allergen Reactions  . Bystolic [Nebivolol Hcl] Anaphylaxis, Itching and Swelling  . Biaxin [Clarithromycin]     The category of the Biaxin family.  Also allergic to water chestnuts.  . Erythromycin Base Rash    Patient Active Problem List   Diagnosis Date Noted  . Fibroid 12/01/2016  . Fibroid, uterine   . Essential hypertension 09/29/2014  . Anemia 02/02/2014  . VITAMIN D DEFICIENCY 12/18/2010  . HYPERLIPIDEMIA 12/18/2010  . CHEST PAIN 12/18/2010  . NEPHROLITHIASIS 03/24/2010  . 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 GRANULOMA 10/09/2009  . ESOPHAGEAL STRICTURE 08/27/2009  . GERD 08/27/2009  . Anxiety  state 04/20/2008  . HYPERTENSION 04/20/2008  . OBESITY, NOS 02/10/2007  . HYPERTENSION, BENIGN SYSTEMIC 02/10/2007  . FIBROADENOSIS, BREAST 02/10/2007    Past Medical History:  Diagnosis Date  . Acid reflux   . Allergy   . Anemia   . Anxiety   . GERD (gastroesophageal reflux disease)   . Hypertension   . Obesity   . Sebaceous cyst     Past Surgical History:  Procedure Laterality Date  . IR GENERIC HISTORICAL  12/01/2016   IR ANGIOGRAM PELVIS SELECTIVE OR SUPRASELECTIVE 12/01/2016 Sandi Mariscal, MD  [facility]  . IR GENERIC HISTORICAL  12/01/2016   IR US GUIDE VASC ACCESS RIGHT 12/01/2016 Sandi Mariscal, MD [facility]  . IR GENERIC HISTORICAL  12/01/2016   IR EMBO ARTERIAL NOT HEMORR HEMANG INC GUIDE ROADMAPPING 12/01/2016 Sandi Mariscal, MD [facility]  . IR GENERIC HISTORICAL  12/01/2016   IR ANGIOGRAM SELECTIVE EACH ADDITIONAL VESSEL 12/01/2016 Sandi Mariscal, MD [facility]  . IR GENERIC HISTORICAL  12/01/2016   IR ANGIOGRAM SELECTIVE EACH ADDITIONAL VESSEL 12/01/2016 Sandi Mariscal, MD [facility]  . IR GENERIC HISTORICAL  12/01/2016   IR ANGIOGRAM PELVIS SELECTIVE OR SUPRASELECTIVE 12/01/2016 Sandi Mariscal, MD [facility]  . IR GENERIC HISTORICAL  01/07/2017   IR RADIOLOGIST EVAL & MGMT 01/07/2017 Sandi Mariscal, MD [facility]  . LOBECTOMY  2009  . MYOMECTOMY  2007  . UTERINE FIBROID EMBOLIZATION     12/01/2016    Social History   Social History  . Marital status: Divorced    Spouse name: N/A  . Number of children: N/A  . Years of education: N/A   Occupational History  . Not on file.   Social History Main Topics  . Smoking status: Never Smoker  . Smokeless tobacco: Never Used  . Alcohol use Yes  . Drug use: No  . Sexual activity: Not on file   Other Topics Concern  . Not on file   Social History Narrative  . No narrative on file    Family History  Problem Relation Age of Onset  . Hypertension Mother   . Obesity Mother   . Stroke Mother   . Diabetes Mother   . Hypertension Sister   . Hyperlipidemia Sister   . Obesity Sister   . Diabetes Sister   . Heart disease Sister   . Mental illness Sister   . Hypertension Brother   . Obesity Brother   . Stroke Brother   . Mental illness Father   . Hypertension Father   . Cancer Brother   . Stroke Brother   . Diabetes Sister   . Hypertension Sister   . Mental illness Sister      Review of Systems  Constitutional: Positive for malaise/fatigue. Negative for chills and fever.  HENT: Positive for  congestion, sore throat and trouble swallowing (painful). Negative for ear pain and nosebleeds.   Eyes: Negative for discharge and redness.  Respiratory: Positive for cough. Negative for shortness of breath.   Cardiovascular: Negative for chest pain, palpitations and leg swelling.  Gastrointestinal: Negative for abdominal pain, diarrhea, nausea and vomiting.  Genitourinary: Negative for dysuria and hematuria.  Musculoskeletal: Negative for neck pain.       Chronic coccyx pain  Skin: Negative for rash.  Neurological: Positive for weakness. Negative for dizziness, sensory change, focal weakness and headaches.  Endo/Heme/Allergies: Negative.   All other systems reviewed and are negative.    Physical Exam  Constitutional: She is oriented to person,  place, and time. She appears well-developed and well-nourished.  HENT:  Head: Normocephalic and atraumatic.  Right Ear: External ear normal.  Left Ear: External ear normal.  Mouth/Throat: Posterior oropharyngeal erythema present. No oropharyngeal exudate.  Eyes: Conjunctivae and EOM are normal. Pupils are equal, round, and reactive to light.  Neck: Normal range of motion. Neck supple. No JVD present. No thyromegaly present.  Cardiovascular: Normal rate, regular rhythm and normal heart sounds.   Pulmonary/Chest: Effort normal and breath sounds normal.  Abdominal: Soft. Bowel sounds are normal. There is no tenderness.  Musculoskeletal: Normal range of motion.  Lymphadenopathy:    She has no cervical adenopathy.  Neurological: She is alert and oriented to person, place, and time. No sensory deficit. She exhibits normal muscle tone.  Skin: Skin is warm and dry. Capillary refill takes less than 2 seconds. No rash noted.  Psychiatric: She has a normal mood and affect.  Vitals reviewed.    ASSESSMENT & PLAN: [place] was seen today for sore throat and back pain.  Diagnoses and all orders for this visit:  Sore throat -     POCT rapid strep  A  Cough  Viral pharyngitis  Acute URI  Coccyx pain  Other orders -     Cancel: POCT urinalysis dipstick -     Cancel: POCT Microscopic Urinalysis ([facility]) -     promethazine-codeine (PHENERGAN WITH CODEINE) 6.25-10 MG/5ML syrup; Take 5 mLs by mouth at bedtime as needed for cough. -     amoxicillin-clavulanate (AUGMENTIN) 875-125 MG tablet; Take 1 tablet by mouth 2 (two) times daily.    Patient Instructions   Start antibiotic if no better or worse in 48 hours as discussed.    IF you received an x-ray today, you will receive an invoice from [facility]. Please contact [facility] at [phone] with questions or concerns regarding your invoice.   IF you received labwork today, you will receive an invoice from [facility]. Please contact [facility] at [phone] with questions or concerns regarding your invoice.   Our billing staff will not be able to assist you with questions regarding bills from these companies.  You will be contacted with the lab results as soon as they are available. The fastest way to get your results is to activate your My Chart account. Instructions are located on the last page of this paperwork. If you have not heard from [place] regarding the results in 2 weeks, please contact this office.      Pharyngitis Pharyngitis is a sore throat (pharynx). There is redness, pain, and swelling of your throat. Follow these instructions at home:  Drink enough fluids to keep your pee (urine) clear or pale yellow.  Only take medicine as told by your doctor.  You may get sick again if you do not take medicine as told. Finish your medicines, even if you start to feel better.  Do not take aspirin.  Rest.  Rinse your mouth (gargle) with salt water ( tsp of salt per 1 qt of water) every 1-2 hours. This will help the pain.  If you are not at risk for choking, you can suck on hard candy or sore throat lozenges. Contact a doctor if:  You have large,  tender lumps on your neck.  You have a rash.  You cough up green, yellow-brown, or bloody spit. Get help right away if:  You have a stiff neck.  You drool or cannot swallow liquids.  You throw up (vomit) or are not able  to keep medicine or liquids down.  You have very bad pain that does not go away with medicine.  You have problems breathing (not from a stuffy nose). This information is not intended to replace advice given to you by your health care provider. Make sure you discuss any questions you have with your health care provider. Document Released: 05/18/2008 Document Revised: 05/07/2016 Document Reviewed: 08/07/2013 Elsevier Interactive Patient Education  2017 Else[employer] Inc.      Agustina Caroli, MD [facility]

## 2017-03-25 ENCOUNTER — Telehealth: Payer: Self-pay | Admitting: General Practice

## 2017-03-26 NOTE — Telephone Encounter (Signed)
PT CALLING STATING THAT THE COUGH SYRUP THAT WAS GIVEN TO HER BY SAG[facility] ISN'T WORKING SHE TAKING IT EVERY HOUR AND NEED ANOTHER RX PLEASE RESPOND

## 2017-03-29 NOTE — Telephone Encounter (Signed)
Feeling better, no need for cough med

## 2017-04-15 ENCOUNTER — Ambulatory Visit (INDEPENDENT_AMBULATORY_CARE_PROVIDER_SITE_OTHER): Payer: BC Managed Care – PPO | Admitting: Emergency Medicine

## 2017-04-15 ENCOUNTER — Encounter: Payer: Self-pay | Admitting: Emergency Medicine

## 2017-04-15 VITALS — BP 133/82 | HR 85 | Temp 98.4°F | Resp 20 | Ht 65.0 in | Wt 201.8 lb

## 2017-04-15 DIAGNOSIS — N183 Chronic kidney disease, stage 3 unspecified: Secondary | ICD-10-CM | POA: Insufficient documentation

## 2017-04-15 DIAGNOSIS — R439 Unspecified disturbances of smell and taste: Secondary | ICD-10-CM

## 2017-04-15 DIAGNOSIS — I10 Essential (primary) hypertension: Secondary | ICD-10-CM

## 2017-04-15 DIAGNOSIS — N189 Chronic kidney disease, unspecified: Secondary | ICD-10-CM | POA: Diagnosis not present

## 2017-04-15 DIAGNOSIS — D649 Anemia, unspecified: Secondary | ICD-10-CM | POA: Diagnosis not present

## 2017-04-15 NOTE — Patient Instructions (Addendum)
     IF you received an x-ray today, you will receive an invoice from [facility]. Please contact [facility] at [phone] with questions or concerns regarding your invoice.   IF you received labwork today, you will receive an invoice from [facility]. Please contact [facility] at [phone] with questions or concerns regarding your invoice.   Our billing staff will not be able to assist you with questions regarding bills from these companies.  You will be contacted with the lab results as soon as they are available. The fastest way to get your results is to activate your My Chart account. Instructions are located on the last page of this paperwork. If you have not heard from us regarding the results in 2 weeks, please contact this office.     Chronic Kidney Disease, Adult Chronic kidney disease (CKD) happens when the kidneys are damaged during a time of 3 or more months. The kidneys are two organs that do many important jobs in the body. These jobs include:  Removing wastes and extra fluids from the blood.  Making hormones that maintain the amount of fluid in your tissues and blood vessels.  Making sure that the body has the right amount of fluids and chemicals.  Most of the time, this condition does not go away, but it can usually be controlled. Steps must be taken to slow down the kidney damage or stop it from getting worse. Otherwise, the kidneys may stop working. Follow these instructions at home:  Follow your diet as told by your doctor. You may need to avoid alcohol, salty foods (sodium), and foods that are high in potassium, calcium, and protein.  Take over-the-counter and prescription medicines only as told by your doctor. Do not take any new medicines unless your doctor says you can do that. These include vitamins and minerals. ? Medicines and nutritional supplements can make kidney damage worse. ? Your doctor may need to change how much medicine you  take.  Do not use any tobacco products. These include cigarettes, chewing tobacco, and e-cigarettes. If you need help quitting, ask your doctor.  Keep all follow-up visits as told by your doctor. This is important.  Check your blood pressure. Tell your doctor if there are changes to your blood pressure.  Get to a healthy weight. Stay at that weight. If you need help with this, ask your doctor.  Start or continue an exercise plan. Try to exercise at least 30 minutes a day, 5 days a week.  Stay up-to-date with your shots (immunizations) as told by your doctor. Contact a doctor if:  Your symptoms get worse.  You have new symptoms. Get help right away if:  You have symptoms of end-stage kidney disease. These include: ? Headaches. ? Skin that is darker or lighter than normal. ? Numbness in your hands or feet. ? Easy bruising. ? Having hiccups often. ? Chest pain. ? Shortness of breath. ? Stopping of menstrual periods in women.  You have a fever.  You are making very little pee (urine).  You have pain or bleeding when you pee (urinate). This information is not intended to replace advice given to you by your health care provider. Make sure you discuss any questions you have with your health care provider. Document Released: 02/24/2010 Document Revised: 05/07/2016 Document Reviewed: 07/29/2012 Elsevier Interactive Patient Education  2017 Else[employer] Inc.  

## 2017-04-15 NOTE — Progress Notes (Signed)
Heidi Herrera 51 y.o.   Chief Complaint  Patient presents with  . Senes Of Smell    X 1 year or more    HISTORY OF PRESENT ILLNESS: This is a 51 y.o. female complaining of loss of smell for at least 1 year; saw ENT last year but no answers; workup was negative. Has h/o HTN, on meds, was difficult to control; also has h/o anemia and blood work shows chronic kidney disease that has never been worked up. No other significant symptoms.  HPI   Prior to Admission medications   Medication Sig Start Date End Date Taking? Authorizing Provider  Ascorbic Acid (VITAMIN C) 1000 MG tablet Take 2,000 mg by mouth 2 (two) times daily.    Yes Historical Provider, MD  Biotin 1000 MCG tablet Take 1,000 mcg by mouth daily.    Yes Historical Provider, MD  calcium-vitamin D (OSCAL WITH D) 500-200 MG-UNIT per tablet Take 1 tablet by mouth 2 (two) times daily. Calcium 1000 mg with Magnesium 400 mg and zinc 15 mg, taking 1 capsule 2 times daily   Yes Historical Provider, MD  cetirizine (ZYRTEC) 10 MG tablet Take 1 tablet (10 mg total) by mouth daily. 02/13/16  Yes Posey Boyer, MD  clobetasol (TEMOVATE) 0.05 % external solution Apply 1 application topically 3 (three) times a week. 07/06/16  Yes Historical Provider, MD  co-enzyme Q-10 30 MG capsule Take 200 mg by mouth daily.   Yes Historical Provider, MD  Cyanocobalamin (VITAMIN B 12 PO) Take 1 tablet by mouth daily.   Yes Historical Provider, MD  hydrochlorothiazide (HYDRODIURIL) 25 MG tablet Take 1 tablet (25 mg total) by mouth daily. 11/20/16  Yes Scot Jun, FNP  iron polysaccharides (NIFEREX) 150 MG capsule Take 150 mg by mouth daily.   Yes Historical Provider, MD  Multiple Vitamins-Minerals (ALIVE ONCE DAILY WOMENS 50+ PO) Take 1 tablet by mouth daily.   Yes Historical Provider, MD  Omega 3-6-9 Fatty Acids (OMEGA-3-6-9 PO) Take 1 capsule by mouth daily.   Yes Historical Provider, MD  promethazine-codeine (PHENERGAN WITH CODEINE) 6.25-10 MG/5ML syrup  Take 5 mLs by mouth at bedtime as needed for cough. 03/23/17  Yes Dyllan Hughett Joellen Jersey, MD  psyllium (METAMUCIL) 58.6 % powder Take 1 packet by mouth daily.    Yes Historical Provider, MD  valsartan-hydrochlorothiazide (DIOVAN HCT) 160-25 MG tablet Take 1 tablet by mouth daily. 11/20/16  Yes Scot Jun, FNP    Allergies  Allergen Reactions  . Bystolic [Nebivolol Hcl] Anaphylaxis, Itching and Swelling  . Biaxin [Clarithromycin]     The category of the Biaxin family.  Also allergic to water chestnuts.  . Erythromycin Base Rash    Patient Active Problem List   Diagnosis Date Noted  . Sore throat 03/23/2017  . Viral pharyngitis 03/23/2017  . Acute URI 03/23/2017  . Fibroid 12/01/2016  . Fibroid, uterine   . Essential hypertension 09/29/2014  . Anemia 02/02/2014  . VITAMIN D DEFICIENCY 12/18/2010  . HYPERLIPIDEMIA 12/18/2010  . CHEST PAIN 12/18/2010  . NEPHROLITHIASIS 03/24/2010  . 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 GRANULOMA 10/09/2009  . ESOPHAGEAL STRICTURE 08/27/2009  . GERD 08/27/2009  . Anxiety state 04/20/2008  . HYPERTENSION 04/20/2008  . OBESITY, NOS 02/10/2007  . HYPERTENSION, BENIGN SYSTEMIC 02/10/2007  . FIBROADENOSIS, BREAST 02/10/2007    Past Medical History:  Diagnosis Date  . Acid reflux   . Allergy   . Anemia   . Anxiety   . GERD (gastroesophageal reflux disease)   .  Hypertension   . Obesity   . Sebaceous cyst     Past Surgical History:  Procedure Laterality Date  . IR GENERIC HISTORICAL  12/01/2016   IR ANGIOGRAM PELVIS SELECTIVE OR SUPRASELECTIVE 12/01/2016 Sandi Mariscal, MD [facility]  . IR GENERIC HISTORICAL  12/01/2016   IR US GUIDE VASC ACCESS RIGHT 12/01/2016 Sandi Mariscal, MD [facility]  . IR GENERIC HISTORICAL  12/01/2016   IR EMBO ARTERIAL NOT HEMORR HEMANG INC GUIDE ROADMAPPING 12/01/2016 Sandi Mariscal, MD [facility]  . IR GENERIC HISTORICAL  12/01/2016   IR ANGIOGRAM SELECTIVE EACH ADDITIONAL VESSEL 12/01/2016 Sandi Mariscal, MD [facility]  . IR GENERIC HISTORICAL  12/01/2016   IR ANGIOGRAM SELECTIVE EACH ADDITIONAL VESSEL 12/01/2016 Sandi Mariscal, MD [facility]  . IR GENERIC HISTORICAL  12/01/2016   IR ANGIOGRAM PELVIS SELECTIVE OR SUPRASELECTIVE 12/01/2016 Sandi Mariscal, MD [facility]  . IR GENERIC HISTORICAL  01/07/2017   IR RADIOLOGIST EVAL & MGMT 01/07/2017 Sandi Mariscal, MD [facility]  . LOBECTOMY  2009  . MYOMECTOMY  2007  . UTERINE FIBROID EMBOLIZATION     12/01/2016    Social History   Social History  . Marital status: Divorced    Spouse name: N/A  . Number of children: N/A  . Years of education: N/A   Occupational History  . Not on file.   Social History Main Topics  . Smoking status: Never Smoker  . Smokeless tobacco: Never Used  . Alcohol use Yes  . Drug use: No  . Sexual activity: Not on file   Other Topics Concern  . Not on file   Social History Narrative  . No narrative on file    Family History  Problem Relation Age of Onset  . Hypertension Mother   . Obesity Mother   . Stroke Mother   . Diabetes Mother   . Hypertension Sister   . Hyperlipidemia Sister   . Obesity Sister   . Diabetes Sister   . Heart disease Sister   . Mental illness Sister   . Hypertension Brother   . Obesity Brother   . Stroke Brother   . Mental illness Father   . Hypertension Father   . Cancer Brother   . Stroke Brother   . Diabetes Sister   . Hypertension Sister   . Mental illness Sister      Review of Systems  Constitutional: Negative for chills and fever.  HENT: Negative for congestion, ear pain, hearing loss, nosebleeds and sore throat.        Loss of smell  Eyes: Negative.  Negative for blurred vision, double vision, discharge and redness.  Respiratory: Negative.  Negative for cough, hemoptysis and shortness of breath.   Cardiovascular: Negative.  Negative for chest pain, palpitations and leg swelling.  Gastrointestinal: Negative.  Negative for abdominal pain, diarrhea, nausea and  vomiting.  Genitourinary: Negative.  Negative for dysuria and hematuria.  Musculoskeletal: Negative for back pain, myalgias and neck pain.  Skin: Negative.  Negative for rash.  Neurological: Positive for weakness (general). Negative for dizziness, sensory change, focal weakness, seizures, loss of consciousness and headaches.  Endo/Heme/Allergies: Negative.   All other systems reviewed and are negative.   Vitals:   04/15/17 1732  BP: 133/82  Pulse: 85  Resp: 20  Temp: 98.4 F (36.9 C)    Physical Exam  Constitutional: She is oriented to person, place, and time. She appears well-developed and well-nourished.  HENT:  Head: Normocephalic and atraumatic.  Right  Ear: Tympanic membrane and external ear normal.  Left Ear: Tympanic membrane and external ear normal.  Nose: Nose normal.  Mouth/Throat: Oropharynx is clear and moist. No oropharyngeal exudate.  Eyes: Conjunctivae and EOM are normal. Pupils are equal, round, and reactive to light.  Neck: Normal range of motion. Neck supple. No JVD present. No thyromegaly present.  Cardiovascular: Normal rate, regular rhythm and normal heart sounds.   Pulmonary/Chest: Effort normal and breath sounds normal.  Abdominal: Soft. Bowel sounds are normal. She exhibits no distension. There is no tenderness.  Musculoskeletal: Normal range of motion.  Lymphadenopathy:    She has no cervical adenopathy.  Neurological: She is alert and oriented to person, place, and time. No sensory deficit. She exhibits normal muscle tone.  Skin: Skin is warm and dry. Capillary refill takes less than 2 seconds. No rash noted.  Psychiatric: She has a normal mood and affect. Her behavior is normal.  Vitals reviewed.    ASSESSMENT & PLAN: Pritika was seen today for senes of smell.  Diagnoses and all orders for this visit:  Smell disturbance -     Ambulatory referral to ENT  Anemia, unspecified type -     CBC with Differential/Platelet  Chronic kidney disease,  unspecified CKD stage -     Ambulatory referral to Nephrology -     Comprehensive metabolic panel  Essential hypertension    Patient Instructions       IF you received an x-ray today, you will receive an invoice from [facility]. Please contact [facility] at [phone] with questions or concerns regarding your invoice.   IF you received labwork today, you will receive an invoice from [place]. Please contact [facility] at [phone] with questions or concerns regarding your invoice.   Our billing staff will not be able to assist you with questions regarding bills from these companies.  You will be contacted with the lab results as soon as they are available. The fastest way to get your results is to activate your My Chart account. Instructions are located on the last page of this paperwork. If you have not heard from [place] regarding the results in 2 weeks, please contact this office.      Chronic Kidney Disease, Adult Chronic kidney disease (CKD) happens when the kidneys are damaged during a time of 3 or more months. The kidneys are two organs that do many important jobs in the body. These jobs include:  Removing wastes and extra fluids from the blood.  Making hormones that maintain the amount of fluid in your tissues and blood vessels.  Making sure that the body has the right amount of fluids and chemicals. Most of the time, this condition does not go away, but it can usually be controlled. Steps must be taken to slow down the kidney damage or stop it from getting worse. Otherwise, the kidneys may stop working. Follow these instructions at home:  Follow your diet as told by your doctor. You may need to avoid alcohol, salty foods (sodium), and foods that are high in potassium, calcium, and protein.  Take over-the-counter and prescription medicines only as told by your doctor. Do not take any new medicines unless your doctor says you can do that. These include  vitamins and minerals.  Medicines and nutritional supplements can make kidney damage worse.  Your doctor may need to change how much medicine you take.  Do not use any tobacco products. These include cigarettes, chewing tobacco, and e-cigarettes. If you need help quitting,  ask your doctor.  Keep all follow-up visits as told by your doctor. This is important.  Check your blood pressure. Tell your doctor if there are changes to your blood pressure.  Get to a healthy weight. Stay at that weight. If you need help with this, ask your doctor.  Start or continue an exercise plan. Try to exercise at least 30 minutes a day, 5 days a week.  Stay up-to-date with your shots (immunizations) as told by your doctor. Contact a doctor if:  Your symptoms get worse.  You have new symptoms. Get help right away if:  You have symptoms of end-stage kidney disease. These include:  Headaches.  Skin that is darker or lighter than normal.  Numbness in your hands or feet.  Easy bruising.  Having hiccups often.  Chest pain.  Shortness of breath.  Stopping of menstrual periods in women.  You have a fever.  You are making very little pee (urine).  You have pain or bleeding when you pee (urinate). This information is not intended to replace advice given to you by your health care provider. Make sure you discuss any questions you have with your health care provider. Document Released: 02/24/2010 Document Revised: 05/07/2016 Document Reviewed: 07/29/2012 Elsevier Interactive Patient Education  2017 Elsevier Inc.      Agustina Caroli, MD [facility]

## 2017-04-16 LAB — COMPREHENSIVE METABOLIC PANEL
A/G RATIO: 1.1 — AB (ref 1.2–2.2)
ALBUMIN: 4.2 g/dL (ref 3.5–5.5)
ALT: 13 IU/L (ref 0–32)
AST: 18 IU/L (ref 0–40)
Alkaline Phosphatase: 55 IU/L (ref 39–117)
BILIRUBIN TOTAL: 0.3 mg/dL (ref 0.0–1.2)
BUN / CREAT RATIO: 10 (ref 9–23)
BUN: 13 mg/dL (ref 6–24)
CHLORIDE: 98 mmol/L (ref 96–106)
CO2: 26 mmol/L (ref 18–29)
Calcium: 9.2 mg/dL (ref 8.7–10.2)
Creatinine, Ser: 1.25 mg/dL — ABNORMAL HIGH (ref 0.57–1.00)
GFR, EST AFRICAN AMERICAN: 57 mL/min/{1.73_m2} — AB (ref >59)
GFR, EST NON AFRICAN AMERICAN: 50 mL/min/{1.73_m2} — AB (ref >59)
GLOBULIN, TOTAL: 3.8 g/dL (ref 1.5–4.5)
Glucose: 115 mg/dL — ABNORMAL HIGH (ref 65–99)
POTASSIUM: 3.7 mmol/L (ref 3.5–5.2)
Sodium: 140 mmol/L (ref 134–144)
Total Protein: 8 g/dL (ref 6.0–8.5)

## 2017-04-16 LAB — CBC WITH DIFFERENTIAL/PLATELET
Basophils Absolute: 0 10*3/uL (ref 0.0–0.2)
Basos: 1 %
EOS (ABSOLUTE): 0.1 10*3/uL (ref 0.0–0.4)
Eos: 2 %
Hematocrit: 33.9 % — ABNORMAL LOW (ref 34.0–46.6)
Hemoglobin: 11 g/dL — ABNORMAL LOW (ref 11.1–15.9)
IMMATURE GRANULOCYTES: 0 %
Immature Grans (Abs): 0 10*3/uL (ref 0.0–0.1)
LYMPHS ABS: 1.7 10*3/uL (ref 0.7–3.1)
Lymphs: 34 %
MCH: 26.3 pg — AB (ref 26.6–33.0)
MCHC: 32.4 g/dL (ref 31.5–35.7)
MCV: 81 fL (ref 79–97)
MONOS ABS: 0.3 10*3/uL (ref 0.1–0.9)
Monocytes: 6 %
NEUTROS PCT: 57 %
Neutrophils Absolute: 2.9 10*3/uL (ref 1.4–7.0)
Platelets: 354 10*3/uL (ref 150–379)
RBC: 4.19 x10E6/uL (ref 3.77–5.28)
RDW: 15.8 % — ABNORMAL HIGH (ref 12.3–15.4)
WBC: 5 10*3/uL (ref 3.4–10.8)

## 2017-05-07 NOTE — Telephone Encounter (Signed)
03/25/17 Feels better.

## 2017-05-19 ENCOUNTER — Ambulatory Visit (INDEPENDENT_AMBULATORY_CARE_PROVIDER_SITE_OTHER): Payer: BC Managed Care – PPO | Admitting: Emergency Medicine

## 2017-05-19 VITALS — BP 121/77 | HR 90 | Temp 98.0°F | Resp 16 | Ht 64.5 in | Wt 203.4 lb

## 2017-05-19 DIAGNOSIS — L249 Irritant contact dermatitis, unspecified cause: Secondary | ICD-10-CM

## 2017-05-19 DIAGNOSIS — M79672 Pain in left foot: Secondary | ICD-10-CM

## 2017-05-19 DIAGNOSIS — M79671 Pain in right foot: Secondary | ICD-10-CM | POA: Diagnosis not present

## 2017-05-19 MED ORDER — TRIAMCINOLONE ACETONIDE 0.1 % EX CREA: 1.0000 "application " | TOPICAL_CREAM | Freq: Two times a day (BID) | CUTANEOUS | 0 refills | Status: DC

## 2017-05-19 NOTE — Patient Instructions (Addendum)
IF you received an x-ray today, you will receive an invoice from [facility]. Please contact [facility] at [phone] with questions or concerns regarding your invoice.   IF you received labwork today, you will receive an invoice from [place]. Please contact [facility] at [phone] with questions or concerns regarding your invoice.   Our billing staff will not be able to assist you with questions regarding bills from these companies.  You will be contacted with the lab results as soon as they are available. The fastest way to get your results is to activate your My Chart account. Instructions are located on the last page of this paperwork. If you have not heard from [place] regarding the results in 2 weeks, please contact this office.    We recommend that you schedule a mammogram for breast cancer screening. Typically, you do not need a referral to do this. Please contact a local imaging center to schedule your mammogram.  [facility] - [phone]  *ask for the Radiology Department [facility] ([place]) - [phone] or [phone]  [facility] - [phone] [place] [phone] [facility] - [phone]  *ask for the [facility] - [phone]  *ask for the Radiology Department [facility] - [phone]  *ask for the [place] - [phone] Contact Dermatitis Dermatitis is redness, soreness, and swelling (inflammation) of the skin. Contact dermatitis is a reaction to certain substances that touch the skin. You either touched something that irritated your skin, or you have allergies to something you touched. Follow these instructions at home: [place] your skin as needed.  Apply cool compresses to the affected areas.  Try taking a bath with: ? Epsom salts. Follow the instructions on the  package. You can get these at a pharmacy or grocery store. ? Baking soda. Pour a small amount into the bath as told by your doctor. ? Colloidal oatmeal. Follow the instructions on the package. You can get this at a pharmacy or grocery store.  Try applying baking soda paste to your skin. Stir water into baking soda until it looks like paste.  Do not scratch your skin.  Bathe less often.  Bathe in lukewarm water. Avoid using hot water. Medicines  Take or apply over-the-counter and prescription medicines only as told by your doctor.  If you were prescribed an antibiotic medicine, take or apply your antibiotic as told by your doctor. Do not stop taking the antibiotic even if your condition starts to get better. General instructions  Keep all follow-up visits as told by your doctor. This is important.  Avoid the substance that caused your reaction. If you do not know what caused it, keep a journal to try to track what caused it. Write down: ? What you eat. ? What cosmetic products you use. ? What you drink. ? What you wear in the affected area. This includes jewelry.  If you were given a bandage (dressing), take care of it as told by your doctor. This includes when to change and remove it. Contact a doctor if:  You do not get better with treatment.  Your condition gets worse.  You have signs of infection such as: ? Swelling. ? Tenderness. ? Redness. ? Soreness. ? Warmth.  You have a fever.  You have new symptoms. Get help right away if:  You have a very bad headache.  You  have neck pain.  Your neck is stiff.  You throw up (vomit).  You feel very sleepy.  You see red streaks coming from the affected area.  Your bone or joint underneath the affected area becomes painful after the skin has healed.  The affected area turns darker.  You have trouble breathing. This information is not intended to replace advice given to you by your health care provider. Make sure  you discuss any questions you have with your health care provider. Document Released: 09/27/2009 Document Revised: 05/07/2016 Document Reviewed: 04/17/2015 Elsevier Interactive Patient Education  2018 [employer].

## 2017-05-19 NOTE — Progress Notes (Signed)
Heidi Herrera 51 y.o.   Chief Complaint  Patient presents with  . Rash    neck x 3-4 days, redness started couple weeks ago but now skin is rough and abrasive feeling, tried ointment on it with no relief  . Pain    left foot x couple weeks    HISTORY OF PRESENT ILLNESS: This is a 51 y.o. female complaining of rash to neck x 3-4 days; uses necklace but took it off now for the exam.  Rash  This is a new problem. The current episode started in the past 7 days. The problem has been gradually worsening since onset. The affected locations include the neck. The rash is characterized by dryness and itchiness. Pertinent negatives include no congestion, cough, facial edema, fever, shortness of breath, sore throat or vomiting. Past treatments include antibiotic cream. The treatment provided no relief.     Prior to Admission medications   Medication Sig Start Date End Date Taking? Authorizing Provider  Ascorbic Acid (VITAMIN C) 1000 MG tablet Take 2,000 mg by mouth 2 (two) times daily.    Yes [provider]  Biotin 1000 MCG tablet Take 1,000 mcg by mouth daily.    Yes [provider]  calcium-vitamin D (OSCAL WITH D) 500-200 MG-UNIT per tablet Take 1 tablet by mouth 2 (two) times daily. Calcium 1000 mg with Magnesium 400 mg and zinc 15 mg, taking 1 capsule 2 times daily   Yes [provider]  cetirizine (ZYRTEC) 10 MG tablet Take 1 tablet (10 mg total) by mouth daily. 02/13/16  Yes Posey Boyer, MD  co-enzyme Q-10 30 MG capsule Take 200 mg by mouth daily.   Yes [provider]  Cyanocobalamin (VITAMIN B 12 PO) Take 1 tablet by mouth daily.   Yes [provider]  hydrochlorothiazide (HYDRODIURIL) 25 MG tablet Take 1 tablet (25 mg total) by mouth daily. 11/20/16  Yes Scot Jun, FNP  iron polysaccharides (NIFEREX) 150 MG capsule Take 150 mg by mouth daily.   Yes [provider]  Multiple Vitamins-Minerals (ALIVE ONCE DAILY WOMENS 50+  PO) Take 1 tablet by mouth daily.   Yes [provider]  Omega 3-6-9 Fatty Acids (OMEGA-3-6-9 PO) Take 1 capsule by mouth daily.   Yes [provider]  psyllium (METAMUCIL) 58.6 % powder Take 1 packet by mouth daily.    Yes [provider]  valsartan-hydrochlorothiazide (DIOVAN HCT) 160-25 MG tablet Take 1 tablet by mouth daily. 11/20/16  Yes Scot Jun, FNP  clobetasol (TEMOVATE) 0.05 % external solution Apply 1 application topically 3 (three) times a week. 07/06/16   [provider]  promethazine-codeine (PHENERGAN WITH CODEINE) 6.25-10 MG/5ML syrup Take 5 mLs by mouth at bedtime as needed for cough. Patient not taking: Reported on 05/19/2017 03/23/17   Horald Pollen, MD  triamcinolone cream (KENALOG) 0.1 % Apply 1 application topically 2 (two) times daily. 05/19/17   Horald Pollen, MD    Allergies  Allergen Reactions  . Bystolic [Nebivolol Hcl] Anaphylaxis, Itching and Swelling  . Biaxin [Clarithromycin]     The category of the Biaxin family.  Also allergic to water chestnuts.  . Erythromycin Base Rash    Patient Active Problem List   Diagnosis Date Noted  . Irritant contact dermatitis 05/19/2017  . Pain in both feet 05/19/2017  . Chronic kidney disease 04/15/2017  . Sore throat 03/23/2017  . Viral pharyngitis 03/23/2017  . Acute URI 03/23/2017  . Fibroid 12/01/2016  .  Fibroid, uterine   . Essential hypertension 09/29/2014  . Anemia 02/02/2014  . VITAMIN D DEFICIENCY 12/18/2010  . HYPERLIPIDEMIA 12/18/2010  . CHEST PAIN 12/18/2010  . NEPHROLITHIASIS 03/24/2010  . 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 GRANULOMA 10/09/2009  . ESOPHAGEAL STRICTURE 08/27/2009  . GERD 08/27/2009  . Anxiety state 04/20/2008  . HYPERTENSION 04/20/2008  . OBESITY, NOS 02/10/2007  . HYPERTENSION, BENIGN SYSTEMIC 02/10/2007  . FIBROADENOSIS, BREAST 02/10/2007    Past Medical History:  Diagnosis Date  . Acid reflux   . Allergy   . Anemia   .  Anxiety   . GERD (gastroesophageal reflux disease)   . Hypertension   . Obesity   . Sebaceous cyst     Past Surgical History:  Procedure Laterality Date  . IR GENERIC HISTORICAL  12/01/2016   IR ANGIOGRAM PELVIS SELECTIVE OR SUPRASELECTIVE 12/01/2016 Sandi Mariscal, MD [facility]  . IR GENERIC HISTORICAL  12/01/2016   IR US GUIDE VASC ACCESS RIGHT 12/01/2016 Sandi Mariscal, MD [facility]  . IR GENERIC HISTORICAL  12/01/2016   IR EMBO ARTERIAL NOT HEMORR HEMANG INC GUIDE ROADMAPPING 12/01/2016 Sandi Mariscal, MD [facility]  . IR GENERIC HISTORICAL  12/01/2016   IR ANGIOGRAM SELECTIVE EACH ADDITIONAL VESSEL 12/01/2016 Sandi Mariscal, MD [facility]  . IR GENERIC HISTORICAL  12/01/2016   IR ANGIOGRAM SELECTIVE EACH ADDITIONAL VESSEL 12/01/2016 Sandi Mariscal, MD [facility]  . IR GENERIC HISTORICAL  12/01/2016   IR ANGIOGRAM PELVIS SELECTIVE OR SUPRASELECTIVE 12/01/2016 Sandi Mariscal, MD [facility]  . IR GENERIC HISTORICAL  01/07/2017   IR RADIOLOGIST EVAL & MGMT 01/07/2017 Sandi Mariscal, MD [facility]  . LOBECTOMY  2009  . MYOMECTOMY  2007  . UTERINE FIBROID EMBOLIZATION     12/01/2016    Social History   Social History  . Marital status: Divorced    Spouse name: N/A  . Number of children: N/A  . Years of education: N/A   Occupational History  . Not on file.   Social History Main Topics  . Smoking status: Never Smoker  . Smokeless tobacco: Never Used  . Alcohol use Yes  . Drug use: No  . Sexual activity: Not on file   Other Topics Concern  . Not on file   Social History Narrative  . No narrative on file    Family History  Problem Relation Age of Onset  . Hypertension Mother   . Obesity Mother   . Stroke Mother   . Diabetes Mother   . Hypertension Sister   . Hyperlipidemia Sister   . Obesity Sister   . Diabetes Sister   . Heart disease Sister   . Mental illness Sister   . Hypertension Brother   . Obesity Brother   . Stroke Brother   . Mental illness Father    . Hypertension Father   . Cancer Brother   . Stroke Brother   . Diabetes Sister   . Hypertension Sister   . Mental illness Sister      Review of Systems  Constitutional: Negative for chills and fever.  HENT: Negative for congestion and sore throat.   Respiratory: Negative for cough and shortness of breath.   Cardiovascular: Negative for chest pain and palpitations.  Gastrointestinal: Negative for abdominal pain, nausea and vomiting.  Musculoskeletal: Negative for back pain, myalgias and neck pain.  Skin: Positive for rash.  Neurological: Negative for dizziness, sensory change, focal weakness and headaches.  Endo/Heme/Allergies: Negative.   All other systems reviewed and  are negative.  Vitals:   05/19/17 1524  BP: 121/77  Pulse: 90  Resp: 16  Temp: 98 F (36.7 C)     Physical Exam  Constitutional: She is oriented to person, place, and time. She appears well-developed.  HENT:  Head: Normocephalic and atraumatic.  Mouth/Throat: Oropharynx is clear and moist.  Eyes: Conjunctivae and EOM are normal. Pupils are equal, round, and reactive to light.  Neck: Normal range of motion. Neck supple. No JVD present.  Cardiovascular: Normal rate and regular rhythm.   Pulmonary/Chest: Effort normal.  Musculoskeletal: Normal range of motion.  Feet: NVI with FROM; no obvious abnormality.  Lymphadenopathy:    She has no cervical adenopathy.  Neurological: She is alert and oriented to person, place, and time. No sensory deficit. She exhibits normal muscle tone.  Skin: Skin is warm and dry. Rash (localized to neck with necklace distribution.) noted.  Vitals reviewed.    ASSESSMENT & PLAN: Emberley was seen today for rash and pain.  Diagnoses and all orders for this visit:  Irritant contact dermatitis, unspecified trigger  Pain in both feet -     Ambulatory referral to Podiatry  Other orders -     triamcinolone cream (KENALOG) 0.1 %; Apply 1 application topically 2 (two) times  daily.    Patient Instructions       IF you received an x-ray today, you will receive an invoice from [facility]. Please contact [facility] at [phone] with questions or concerns regarding your invoice.   IF you received labwork today, you will receive an invoice from [facility]. Please contact [facility] at [phone] with questions or concerns regarding your invoice.   Our billing staff will not be able to assist you with questions regarding bills from these companies.  You will be contacted with the lab results as soon as they are available. The fastest way to get your results is to activate your My Chart account. Instructions are located on the last page of this paperwork. If you have not heard from [place] regarding the results in 2 weeks, please contact this office.    We recommend that you schedule a mammogram for breast cancer screening. Typically, you do not need a referral to do this. Please contact a local imaging center to schedule your mammogram.  [facility] - [phone]  *ask for the Radiology Department [facility] - [phone] or [phone]  [facility] - [phone] [place] [phone] [facility] - [phone]  *ask for the [facility] - [phone]  *ask for the Radiology Department [facility] - [phone]  *ask for the [facility] - [phone] Contact Dermatitis Dermatitis is redness, soreness, and swelling (inflammation) of the skin. Contact dermatitis is a reaction to certain substances that touch the skin. You either touched something that irritated your skin, or you have allergies to something you touched. Follow these instructions at home: [place] your skin as needed.  Apply cool compresses to the affected areas.  Try taking a bath with: ? Epsom  salts. Follow the instructions on the package. You can get these at a pharmacy or grocery store. ? Baking soda. Pour a small amount into the bath as told by your doctor. ? Colloidal oatmeal. Follow the instructions on the package. You can get this at a pharmacy or grocery store.  Try applying baking soda paste to your skin.  Stir water into baking soda until it looks like paste.  Do not scratch your skin.  Bathe less often.  Bathe in lukewarm water. Avoid using hot water. Medicines  Take or apply over-the-counter and prescription medicines only as told by your doctor.  If you were prescribed an antibiotic medicine, take or apply your antibiotic as told by your doctor. Do not stop taking the antibiotic even if your condition starts to get better. General instructions  Keep all follow-up visits as told by your doctor. This is important.  Avoid the substance that caused your reaction. If you do not know what caused it, keep a journal to try to track what caused it. Write down: ? What you eat. ? What cosmetic products you use. ? What you drink. ? What you wear in the affected area. This includes jewelry.  If you were given a bandage (dressing), take care of it as told by your doctor. This includes when to change and remove it. Contact a doctor if:  You do not get better with treatment.  Your condition gets worse.  You have signs of infection such as: ? Swelling. ? Tenderness. ? Redness. ? Soreness. ? Warmth.  You have a fever.  You have new symptoms. Get help right away if:  You have a very bad headache.  You have neck pain.  Your neck is stiff.  You throw up (vomit).  You feel very sleepy.  You see red streaks coming from the affected area.  Your bone or joint underneath the affected area becomes painful after the skin has healed.  The affected area turns darker.  You have trouble breathing. This information is not intended to replace advice given to you by  your health care provider. Make sure you discuss any questions you have with your health care provider. Document Released: 09/27/2009 Document Revised: 05/07/2016 Document Reviewed: 04/17/2015 Elsevier Interactive Patient Education  2018 Elsevier Inc.      Agustina Caroli, MD [facility]

## 2017-05-25 ENCOUNTER — Other Ambulatory Visit (HOSPITAL_COMMUNITY): Payer: Self-pay | Admitting: Interventional Radiology

## 2017-05-25 DIAGNOSIS — D259 Leiomyoma of uterus, unspecified: Secondary | ICD-10-CM

## 2017-06-22 ENCOUNTER — Encounter: Payer: Self-pay | Admitting: Radiology

## 2017-06-24 ENCOUNTER — Ambulatory Visit (INDEPENDENT_AMBULATORY_CARE_PROVIDER_SITE_OTHER): Payer: BC Managed Care – PPO | Admitting: Podiatry

## 2017-06-24 ENCOUNTER — Ambulatory Visit: Payer: BC Managed Care – PPO

## 2017-06-24 ENCOUNTER — Encounter: Payer: Self-pay | Admitting: Podiatry

## 2017-06-24 VITALS — BP 164/109 | HR 69

## 2017-06-24 DIAGNOSIS — M779 Enthesopathy, unspecified: Secondary | ICD-10-CM | POA: Diagnosis not present

## 2017-06-24 DIAGNOSIS — M21619 Bunion of unspecified foot: Secondary | ICD-10-CM | POA: Diagnosis not present

## 2017-06-24 DIAGNOSIS — M79672 Pain in left foot: Principal | ICD-10-CM

## 2017-06-24 DIAGNOSIS — M79671 Pain in right foot: Secondary | ICD-10-CM

## 2017-06-24 MED ORDER — TRIAMCINOLONE ACETONIDE 10 MG/ML IJ SUSP
10.0000 mg | Freq: Once | INTRAMUSCULAR | Status: AC
  Administered 2017-06-24: 10 mg

## 2017-06-24 NOTE — Patient Instructions (Signed)

## 2017-06-24 NOTE — Progress Notes (Signed)
   Subjective:    Patient ID: Heidi Herrera, female    DOB: 08-24-1966, 51 y.o.   MRN: [id number]  HPI  Chief Complaint  Patient presents with  . Foot Pain    Lt foot across top of toes onset over 2 months/   . Bunions    BL       Review of Systems  Constitutional: Negative.   HENT: Negative.        Sense of smell   Eyes: Negative.   Respiratory: Negative.   Cardiovascular: Positive for leg swelling.  Gastrointestinal: Negative.   Endocrine: Negative.   Genitourinary: Negative.   Musculoskeletal: Positive for gait problem.  Skin: Negative.   Allergic/Immunologic: Positive for food allergies.  Neurological: Positive for numbness.  Hematological: Negative.   Psychiatric/Behavioral: Negative.        Objective:   Physical Exam        Assessment & Plan:

## 2017-06-25 NOTE — Progress Notes (Signed)
Subjective:    Patient ID: Heidi Herrera, female   DOB: 51 y.o.   MRN: [id number]   HPI patient states that over the last few months she's developed a lot of pain on the top of her left foot and she has long-term structural bunion deformity bilateral which is increasingly tender with the left being worse than the right. Does not remember injury    Review of Systems  All other systems reviewed and are negative.       Objective:  Physical Exam  Cardiovascular: Intact distal pulses.   Musculoskeletal: Normal range of motion.  Neurological: She is alert.  Skin: Skin is warm.  Nursing note and vitals reviewed.  neurovascular status intact muscle strength adequate range of motion within normal limits with patient noted to have exquisite discomfort in the second metatarsophalangeal joint left with mild elevation of the digit and structural bunion deformity left over right with redness around the first metatarsal head that's painful when pressed. Patient's noted to have good digital perfusion and is well oriented 3 with moderate flatfoot deformity     Assessment:    Acute capsulitis second MPJ left with long-term structural bunion deformity left over right with symptoms     Plan:  H&P conditions reviewed and discussed. At this point were to focus on the Capsulitis and I went ahead did a forefoot block explained the risk of injection aspirated the second MPJ getting out of small amount of clear fluid and injected with a quarter cc dexamethasone Kenalog and applied thick plantar pad to reduce pressure on the joint and advised on not going barefoot and wearing rigid bottom shoes. I then discussed bunions which she wants to get corrected but we will discuss this visit after I get the inflammation under control  X-rays indicate that there is structural bunion deformity bilateral with elevation of the intermetatarsal angle of approximate 15

## 2017-06-28 DIAGNOSIS — N912 Amenorrhea, unspecified: Secondary | ICD-10-CM | POA: Insufficient documentation

## 2017-06-28 DIAGNOSIS — N644 Mastodynia: Secondary | ICD-10-CM | POA: Insufficient documentation

## 2017-06-29 ENCOUNTER — Telehealth: Payer: Self-pay | Admitting: Family Medicine

## 2017-06-29 NOTE — Telephone Encounter (Signed)
PT CALLING FOR ANOTHER RX SHE STATES THAT THE BLOOD PRESSURE VALSARTAN IS ON A RECALL SHE HEARD IT ON THE NEWS THIS MORNING SHE WOULD LIKE ANOTHER RX

## 2017-06-29 NOTE — Telephone Encounter (Signed)
Please advise 

## 2017-06-30 ENCOUNTER — Ambulatory Visit (INDEPENDENT_AMBULATORY_CARE_PROVIDER_SITE_OTHER): Payer: BC Managed Care – PPO | Admitting: Emergency Medicine

## 2017-06-30 ENCOUNTER — Encounter: Payer: Self-pay | Admitting: Emergency Medicine

## 2017-06-30 VITALS — BP 130/78 | HR 59 | Temp 98.4°F | Resp 18 | Ht 64.53 in | Wt 202.2 lb

## 2017-06-30 DIAGNOSIS — I10 Essential (primary) hypertension: Secondary | ICD-10-CM

## 2017-06-30 MED ORDER — LISINOPRIL-HYDROCHLOROTHIAZIDE 20-25 MG PO TABS: 1.0000 | ORAL_TABLET | Freq: Every day | ORAL | 3 refills | Status: DC

## 2017-06-30 NOTE — Telephone Encounter (Signed)
Needs OV.  

## 2017-06-30 NOTE — Patient Instructions (Addendum)
   IF you received an x-ray today, you will receive an invoice from [facility]. Please contact [facility] at [phone] with questions or concerns regarding your invoice.   IF you received labwork today, you will receive an invoice from [facility]. Please contact [facility] at [phone] with questions or concerns regarding your invoice.   Our billing staff will not be able to assist you with questions regarding bills from these companies.  You will be contacted with the lab results as soon as they are available. The fastest way to get your results is to activate your My Chart account. Instructions are located on the last page of this paperwork. If you have not heard from us regarding the results in 2 weeks, please contact this office.     Hypertension Hypertension is another name for high blood pressure. High blood pressure forces your heart to work harder to pump blood. This can cause problems over time. There are two numbers in a blood pressure reading. There is a top number (systolic) over a bottom number (diastolic). It is best to have a blood pressure below 120/80. Healthy choices can help lower your blood pressure. You may need medicine to help lower your blood pressure if:  Your blood pressure cannot be lowered with healthy choices.  Your blood pressure is higher than 130/80.  Follow these instructions at home: Eating and drinking  If directed, follow the DASH eating plan. This diet includes: ? Filling half of your plate at each meal with fruits and vegetables. ? Filling one quarter of your plate at each meal with whole grains. Whole grains include whole wheat pasta, brown rice, and whole grain bread. ? Eating or drinking low-fat dairy products, such as skim milk or low-fat yogurt. ? Filling one quarter of your plate at each meal with low-fat (lean) proteins. Low-fat proteins include fish, skinless chicken, eggs, beans, and tofu. ? Avoiding fatty meat, cured  and processed meat, or chicken with skin. ? Avoiding premade or processed food.  Eat less than 1,500 mg of salt (sodium) a day.  Limit alcohol use to no more than 1 drink a day for nonpregnant women and 2 drinks a day for men. One drink equals 12 oz of beer, 5 oz of wine, or 1 oz of hard liquor. Lifestyle  Work with your doctor to stay at a healthy weight or to lose weight. Ask your doctor what the best weight is for you.  Get at least 30 minutes of exercise that causes your heart to beat faster (aerobic exercise) most days of the week. This may include walking, swimming, or biking.  Get at least 30 minutes of exercise that strengthens your muscles (resistance exercise) at least 3 days a week. This may include lifting weights or pilates.  Do not use any products that contain nicotine or tobacco. This includes cigarettes and e-cigarettes. If you need help quitting, ask your doctor.  Check your blood pressure at home as told by your doctor.  Keep all follow-up visits as told by your doctor. This is important. Medicines  Take over-the-counter and prescription medicines only as told by your doctor. Follow directions carefully.  Do not skip doses of blood pressure medicine. The medicine does not work as well if you skip doses. Skipping doses also puts you at risk for problems.  Ask your doctor about side effects or reactions to medicines that you should watch for. Contact a doctor if:  You think you are having a reaction to the   medicine you are taking.  You have headaches that keep coming back (recurring).  You feel dizzy.  You have swelling in your ankles.  You have trouble with your vision. Get help right away if:  You get a very bad headache.  You start to feel confused.  You feel weak or numb.  You feel faint.  You get very bad pain in your: ? Chest. ? Belly (abdomen).  You throw up (vomit) more than once.  You have trouble breathing. Summary  Hypertension is  another name for high blood pressure.  Making healthy choices can help lower blood pressure. If your blood pressure cannot be controlled with healthy choices, you may need to take medicine. This information is not intended to replace advice given to you by your health care provider. Make sure you discuss any questions you have with your health care provider. Document Released: 05/18/2008 Document Revised: 10/28/2016 Document Reviewed: 10/28/2016 Elsevier Interactive Patient Education  2018 Elsevier Inc.  

## 2017-06-30 NOTE — Progress Notes (Signed)
Heidi Herrera 51 y.o.   Chief Complaint  Patient presents with  . Medication Problem    pt states that there is a re-call on her Valsartan-Hydrochorothiazide    HISTORY OF PRESENT ILLNESS: This is a 51 y.o. female with h/o HTN, wants to switch BP medication after finding out about Losartan recall news.  HPI   Prior to Admission medications   Medication Sig Start Date End Date Taking? Authorizing Provider  Ascorbic Acid (VITAMIN C) 1000 MG tablet Take 2,000 mg by mouth 2 (two) times daily.    Yes [provider]  Biotin 1000 MCG tablet Take 1,000 mcg by mouth daily.    Yes [provider]  calcium-vitamin D (OSCAL WITH D) 500-200 MG-UNIT per tablet Take 1 tablet by mouth 2 (two) times daily. Calcium 1000 mg with Magnesium 400 mg and zinc 15 mg, taking 1 capsule 2 times daily   Yes [provider]  cetirizine (ZYRTEC) 10 MG tablet Take 1 tablet (10 mg total) by mouth daily. 02/13/16  Yes Posey Boyer, MD  clobetasol (TEMOVATE) 0.05 % external solution Apply 1 application topically 3 (three) times a week. 07/06/16  Yes [provider]  co-enzyme Q-10 30 MG capsule Take 200 mg by mouth daily.   Yes [provider]  Cyanocobalamin (VITAMIN B 12 PO) Take 1 tablet by mouth daily.   Yes [provider]  hydrochlorothiazide (HYDRODIURIL) 25 MG tablet Take 1 tablet (25 mg total) by mouth daily. 11/20/16  Yes Scot Jun, FNP  iron polysaccharides (NIFEREX) 150 MG capsule Take 150 mg by mouth daily.   Yes [provider]  Multiple Vitamins-Minerals (ALIVE ONCE DAILY WOMENS 50+ PO) Take 1 tablet by mouth daily.   Yes [provider]  Omega 3-6-9 Fatty Acids (OMEGA-3-6-9 PO) Take 1 capsule by mouth daily.   Yes [provider]  psyllium (METAMUCIL) 58.6 % powder Take 1 packet by mouth daily.    Yes [provider]  valsartan-hydrochlorothiazide (DIOVAN HCT) 160-25 MG tablet Take 1 tablet by mouth daily.  11/20/16  Yes Scot Jun, FNP  triamcinolone cream (KENALOG) 0.1 % Apply 1 application topically 2 (two) times daily. Patient not taking: Reported on 06/30/2017 05/19/17   Horald Pollen, MD    Allergies  Allergen Reactions  . Bystolic [Nebivolol Hcl] Anaphylaxis, Itching and Swelling  . Biaxin [Clarithromycin]     The category of the Biaxin family.  Also allergic to water chestnuts.  . Erythromycin Base Rash    Patient Active Problem List   Diagnosis Date Noted  . Irritant contact dermatitis 05/19/2017  . Pain in both feet 05/19/2017  . Chronic kidney disease 04/15/2017  . Sore throat 03/23/2017  . Viral pharyngitis 03/23/2017  . Acute URI 03/23/2017  . Fibroid 12/01/2016  . Fibroid, uterine   . Essential hypertension 09/29/2014  . Anemia 02/02/2014  . VITAMIN D DEFICIENCY 12/18/2010  . HYPERLIPIDEMIA 12/18/2010  . CHEST PAIN 12/18/2010  . NEPHROLITHIASIS 03/24/2010  . 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 GRANULOMA 10/09/2009  . ESOPHAGEAL STRICTURE 08/27/2009  . GERD 08/27/2009  . Anxiety state 04/20/2008  . HYPERTENSION 04/20/2008  . OBESITY, NOS 02/10/2007  . HYPERTENSION, BENIGN SYSTEMIC 02/10/2007  . FIBROADENOSIS, BREAST 02/10/2007    Past Medical History:  Diagnosis Date  . Acid reflux   . Allergy   . Anemia   . Anxiety   . GERD (gastroesophageal reflux disease)   . Hypertension   . Obesity   . Sebaceous  cyst     Past Surgical History:  Procedure Laterality Date  . IR GENERIC HISTORICAL  12/01/2016   IR ANGIOGRAM PELVIS SELECTIVE OR SUPRASELECTIVE 12/01/2016 Sandi Mariscal, MD [facility]  . IR GENERIC HISTORICAL  12/01/2016   IR US GUIDE VASC ACCESS RIGHT 12/01/2016 Sandi Mariscal, MD [facility]  . IR GENERIC HISTORICAL  12/01/2016   IR EMBO ARTERIAL NOT HEMORR HEMANG INC GUIDE ROADMAPPING 12/01/2016 Sandi Mariscal, MD [facility]  . IR GENERIC HISTORICAL  12/01/2016   IR ANGIOGRAM SELECTIVE EACH ADDITIONAL VESSEL 12/01/2016 Sandi Mariscal, MD  [facility]  . IR GENERIC HISTORICAL  12/01/2016   IR ANGIOGRAM SELECTIVE EACH ADDITIONAL VESSEL 12/01/2016 Sandi Mariscal, MD [facility]  . IR GENERIC HISTORICAL  12/01/2016   IR ANGIOGRAM PELVIS SELECTIVE OR SUPRASELECTIVE 12/01/2016 Sandi Mariscal, MD [facility]  . IR GENERIC HISTORICAL  01/07/2017   IR RADIOLOGIST EVAL & MGMT 01/07/2017 Sandi Mariscal, MD [facility]  . LOBECTOMY  2009  . MYOMECTOMY  2007  . UTERINE FIBROID EMBOLIZATION     12/01/2016    Social History   Social History  . Marital status: Divorced    Spouse name: N/A  . Number of children: N/A  . Years of education: N/A   Occupational History  . Not on file.   Social History Main Topics  . Smoking status: Never Smoker  . Smokeless tobacco: Never Used  . Alcohol use 0.6 oz/week    1 Glasses of wine per week  . Drug use: No  . Sexual activity: Not on file   Other Topics Concern  . Not on file   Social History Narrative  . No narrative on file    Family History  Problem Relation Age of Onset  . Hypertension Mother   . Obesity Mother   . Stroke Mother   . Diabetes Mother   . Hypertension Sister   . Hyperlipidemia Sister   . Obesity Sister   . Diabetes Sister   . Heart disease Sister   . Mental illness Sister   . Hypertension Brother   . Obesity Brother   . Stroke Brother   . Mental illness Father   . Hypertension Father   . Cancer Brother   . Stroke Brother   . Diabetes Sister   . Hypertension Sister   . Mental illness Sister      Review of Systems  Constitutional: Negative.  Negative for chills and fever.  HENT: Negative.  Negative for congestion, nosebleeds and sore throat.   Eyes: Negative.  Negative for blurred vision and double vision.  Respiratory: Negative.  Negative for cough, shortness of breath and wheezing.   Cardiovascular: Positive for palpitations (with Amlodipine). Negative for chest pain, claudication and leg swelling.  Gastrointestinal: Negative for abdominal pain,  diarrhea, nausea and vomiting.  Genitourinary: Negative.  Negative for dysuria, flank pain and hematuria.  Skin: Negative.  Negative for rash.  Neurological: Negative.  Negative for dizziness and headaches.  Endo/Heme/Allergies: Negative.   All other systems reviewed and are negative.  Vitals:   06/30/17 1231 06/30/17 1306  BP: (!) 146/85 130/78  Pulse: (!) 59   Resp: 18   Temp: 98.4 F (36.9 C)     Physical Exam  Constitutional: She is oriented to person, place, and time. She appears well-developed and well-nourished.  HENT:  Head: Normocephalic and atraumatic.  Nose: Nose normal.  Mouth/Throat: Oropharynx is clear and moist. No oropharyngeal exudate.  Eyes: Pupils are equal, round, and reactive  to light. Conjunctivae and EOM are normal.  Neck: Normal range of motion. Neck supple. No JVD present. No thyromegaly present.  Cardiovascular: Normal rate, regular rhythm and normal heart sounds.   Pulmonary/Chest: Effort normal and breath sounds normal.  Abdominal: Soft. She exhibits no distension. There is no tenderness.  Musculoskeletal: Normal range of motion.  Neurological: She is alert and oriented to person, place, and time. No sensory deficit. She exhibits normal muscle tone.  Skin: Skin is warm. Capillary refill takes less than 2 seconds.  Psychiatric: She has a normal mood and affect. Her behavior is normal.  Vitals reviewed.    ASSESSMENT & PLAN: Rena was seen today for medication problem.  Diagnoses and all orders for this visit:  Essential hypertension -     lisinopril-hydrochlorothiazide (PRINZIDE,ZESTORETIC) 20-25 MG tablet; Take 1 tablet by mouth daily.   Stop Losartan and also Hydrodiuril.  Patient Instructions       IF you received an x-ray today, you will receive an invoice from [facility]. Please contact [facility] at [phone] with questions or concerns regarding your invoice.   IF you received labwork today, you will  receive an invoice from [facility]. Please contact [facility] at [phone] with questions or concerns regarding your invoice.   Our billing staff will not be able to assist you with questions regarding bills from these companies.  You will be contacted with the lab results as soon as they are available. The fastest way to get your results is to activate your My Chart account. Instructions are located on the last page of this paperwork. If you have not heard from [place] regarding the results in 2 weeks, please contact this office.     Hypertension Hypertension is another name for high blood pressure. High blood pressure forces your heart to work harder to pump blood. This can cause problems over time. There are two numbers in a blood pressure reading. There is a top number (systolic) over a bottom number (diastolic). It is best to have a blood pressure below 120/80. Healthy choices can help lower your blood pressure. You may need medicine to help lower your blood pressure if:  Your blood pressure cannot be lowered with healthy choices.  Your blood pressure is higher than 130/80.  Follow these instructions at home: Eating and drinking  If directed, follow the DASH eating plan. This diet includes: ? Filling half of your plate at each meal with fruits and vegetables. ? Filling one quarter of your plate at each meal with whole grains. Whole grains include whole wheat pasta, brown rice, and whole grain bread. ? Eating or drinking low-fat dairy products, such as skim milk or low-fat yogurt. ? Filling one quarter of your plate at each meal with low-fat (lean) proteins. Low-fat proteins include fish, skinless chicken, eggs, beans, and tofu. ? Avoiding fatty meat, cured and processed meat, or chicken with skin. ? Avoiding premade or processed food.  Eat less than 1,500 mg of salt (sodium) a day.  Limit alcohol use to no more than 1 drink a day for nonpregnant women and 2 drinks a day for men. One drink  equals 12 oz of beer, 5 oz of wine, or 1 oz of hard liquor. Lifestyle  Work with your doctor to stay at a healthy weight or to lose weight. Ask your doctor what the best weight is for you.  Get at least 30 minutes of exercise that causes your heart to beat faster (aerobic exercise) most days of the  week. This may include walking, swimming, or biking.  Get at least 30 minutes of exercise that strengthens your muscles (resistance exercise) at least 3 days a week. This may include lifting weights or pilates.  Do not use any products that contain nicotine or tobacco. This includes cigarettes and e-cigarettes. If you need help quitting, ask your doctor.  Check your blood pressure at home as told by your doctor.  Keep all follow-up visits as told by your doctor. This is important. Medicines  Take over-the-counter and prescription medicines only as told by your doctor. Follow directions carefully.  Do not skip doses of blood pressure medicine. The medicine does not work as well if you skip doses. Skipping doses also puts you at risk for problems.  Ask your doctor about side effects or reactions to medicines that you should watch for. Contact a doctor if:  You think you are having a reaction to the medicine you are taking.  You have headaches that keep coming back (recurring).  You feel dizzy.  You have swelling in your ankles.  You have trouble with your vision. Get help right away if:  You get a very bad headache.  You start to feel confused.  You feel weak or numb.  You feel faint.  You get very bad pain in your: ? Chest. ? Belly (abdomen).  You throw up (vomit) more than once.  You have trouble breathing. Summary  Hypertension is another name for high blood pressure.  Making healthy choices can help lower blood pressure. If your blood pressure cannot be controlled with healthy choices, you may need to take medicine. This information is not intended to replace advice  given to you by your health care provider. Make sure you discuss any questions you have with your health care provider. Document Released: 05/18/2008 Document Revised: 10/28/2016 Document Reviewed: 10/28/2016 Elsevier Interactive Patient Education  2018 Elsevier Inc.      Agustina Caroli, MD [facility]

## 2017-07-08 ENCOUNTER — Ambulatory Visit: Payer: BC Managed Care – PPO | Admitting: Podiatry

## 2017-07-09 NOTE — Telephone Encounter (Signed)
Patient was seen 06/30/2017 and got a new prescription

## 2017-07-15 ENCOUNTER — Ambulatory Visit: Payer: BC Managed Care – PPO | Admitting: Podiatry

## 2017-08-31 ENCOUNTER — Ambulatory Visit
Admission: RE | Admit: 2017-08-31 | Discharge: 2017-08-31 | Disposition: A | Discharge status: Home / Self Care | Payer: BC Managed Care – PPO | Source: Ambulatory Visit | Attending: Interventional Radiology | Admitting: Interventional Radiology

## 2017-08-31 DIAGNOSIS — D259 Leiomyoma of uterus, unspecified: Secondary | ICD-10-CM

## 2017-08-31 HISTORY — PX: IR RADIOLOGIST EVAL & MGMT: IMG5224

## 2017-08-31 NOTE — Progress Notes (Signed)
Patient ID: Heidi Herrera, female   DOB: 09/21/1966, 51 y.o.   MRN: [id number]         Chief Complaint: Post UFE  Referring Physician(s): Haygood  History of Present Illness: Heidi Herrera is a 51 y.o. (G1, P9) female who underwent a successful uterine fibroid embolization performed on 12/01/2016. She returns at interventional radiology clinic for postprocedural evaluation and management. She is unaccompanied and serves as her own historian.  In brief review, patient was initially seen in consultation at the IR clinic on 04/24/2015 however at that time was hesitant to uterine fibroid embolization. Unfortunately, the patient's fibroid related symptoms worsened and as such, she returned to the interventional radiology clinic on 05/20/2016 following the acquisition of a contrast enhanced pelvic MRI performed 04/22/2016.  She was initially diagnosed with fibroids in 2007 at which time she underwent a myomectomy. Since that time the patient experienced severe menorrhagia though stated this had worsened prior to the procedure. Patient states that her cycle have been regular occurring every 28 days lasting 5 days, 2-3 days of which were associated with severe menorrhagia including the passage of clots and changing of tampons every 2 hours. Patient also complained of bulk symptoms including frequent urination, nocturia as well as abdominal bloating and constipation, all of which she reported worsening the time of her cycle.  Since undergoing the uterine fibroid embolization on 12/02/2016, the patient reports significant improvement in her preprocedural fibroid related symptoms. She states that she missed 2 cycles over the summer but experienced a cycle in August of this year which she said was associated with only a minimal amount of expected bleeding. She also reports significant improvement in her preprocedural frequent urination and nocturia.      Past Medical History:  Diagnosis Date  . Acid  reflux   . Allergy   . Anemia   . Anxiety   . GERD (gastroesophageal reflux disease)   . Hypertension   . Obesity   . Sebaceous cyst     Past Surgical History:  Procedure Laterality Date  . IR GENERIC HISTORICAL  12/01/2016   IR ANGIOGRAM PELVIS SELECTIVE OR SUPRASELECTIVE 12/01/2016 Sandi Mariscal, MD [facility]  . IR GENERIC HISTORICAL  12/01/2016   IR US GUIDE VASC ACCESS RIGHT 12/01/2016 Sandi Mariscal, MD [facility]  . IR GENERIC HISTORICAL  12/01/2016   IR EMBO ARTERIAL NOT HEMORR HEMANG INC GUIDE ROADMAPPING 12/01/2016 Sandi Mariscal, MD [facility]  . IR GENERIC HISTORICAL  12/01/2016   IR ANGIOGRAM SELECTIVE EACH ADDITIONAL VESSEL 12/01/2016 Sandi Mariscal, MD [facility]  . IR GENERIC HISTORICAL  12/01/2016   IR ANGIOGRAM SELECTIVE EACH ADDITIONAL VESSEL 12/01/2016 Sandi Mariscal, MD [facility]  . IR GENERIC HISTORICAL  12/01/2016   IR ANGIOGRAM PELVIS SELECTIVE OR SUPRASELECTIVE 12/01/2016 Sandi Mariscal, MD [facility]  . IR GENERIC HISTORICAL  01/07/2017   IR RADIOLOGIST EVAL & MGMT 01/07/2017 Sandi Mariscal, MD [facility]  . LOBECTOMY  2009  . MYOMECTOMY  2007  . UTERINE FIBROID EMBOLIZATION     12/01/2016    Allergies: Bystolic [nebivolol hcl]; Biaxin [clarithromycin]; and Erythromycin base  Medications: Prior to Admission medications   Medication Sig Start Date End Date Taking? Authorizing Provider  Ascorbic Acid (VITAMIN C) 1000 MG tablet Take 2,000 mg by mouth 2 (two) times daily.    Yes [provider]  Biotin 1000 MCG tablet Take 1,000 mcg by mouth daily.    Yes [provider]  calcium-vitamin D (OSCAL WITH D)  500-200 MG-UNIT per tablet Take 1 tablet by mouth 2 (two) times daily. Calcium 1000 mg with Magnesium 400 mg and zinc 15 mg, taking 1 capsule 2 times daily   Yes [provider]  cetirizine (ZYRTEC) 10 MG tablet Take 1 tablet (10 mg total) by mouth daily. 02/13/16  Yes Posey Boyer, MD  co-enzyme Q-10 30 MG capsule Take 200 mg  by mouth daily.   Yes [provider]  Cyanocobalamin (VITAMIN B 12 PO) Take 1 tablet by mouth daily.   Yes [provider]  lisinopril-hydrochlorothiazide (PRINZIDE,ZESTORETIC) 20-25 MG tablet Take 1 tablet by mouth daily. 06/30/17  Yes Sagardia, Ines Bloomer, MD  Multiple Vitamins-Minerals (ALIVE ONCE DAILY WOMENS 50+ PO) Take 1 tablet by mouth daily.   Yes [provider]  Omega 3-6-9 Fatty Acids (OMEGA-3-6-9 PO) Take 1 capsule by mouth daily.   Yes [provider]  psyllium (METAMUCIL) 58.6 % powder Take 1 packet by mouth daily.    Yes [provider]  clobetasol (TEMOVATE) 0.05 % external solution Apply 1 application topically 3 (three) times a week. 07/06/16   [provider]  iron polysaccharides (NIFEREX) 150 MG capsule Take 150 mg by mouth daily.    [provider]  triamcinolone cream (KENALOG) 0.1 % Apply 1 application topically 2 (two) times daily. Patient not taking: Reported on 06/30/2017 05/19/17   Horald Pollen, MD     Family History  Problem Relation Age of Onset  . Hypertension Mother   . Obesity Mother   . Stroke Mother   . Diabetes Mother   . Hypertension Sister   . Hyperlipidemia Sister   . Obesity Sister   . Diabetes Sister   . Heart disease Sister   . Mental illness Sister   . Hypertension Brother   . Obesity Brother   . Stroke Brother   . Mental illness Father   . Hypertension Father   . Cancer Brother   . Stroke Brother   . Diabetes Sister   . Hypertension Sister   . Mental illness Sister     Social History   Social History  . Marital status: Divorced    Spouse name: N/A  . Number of children: N/A  . Years of education: N/A   Social History Main Topics  . Smoking status: Never Smoker  . Smokeless tobacco: Never Used  . Alcohol use 0.6 oz/week    1 Glasses of wine per week  . Drug use: No  . Sexual activity: Not on file   Other Topics Concern  . Not on file   Social History  Narrative  . No narrative on file    ECOG Status: 0 - Asymptomatic  Review of Systems: A 12 point ROS discussed and pertinent positives are indicated in the HPI above.  All other systems are negative.  Review of Systems  Vital Signs: BP 123/72   Pulse 72   Temp 98.7 F (37.1 C) (Oral)   Resp 14   Ht 5' 5.5" (1.664 m)   Wt 193 lb (87.5 kg)   SpO2 98%   BMI 31.63 kg/m   Physical Exam   Imaging:  Selected images from patient's uterine fibroid and utilization performed 12/01/2016 were reviewed in detail.  Labs:  CBC:  Recent Labs  11/20/16 1310 12/01/16 1217 04/15/17 1801  WBC 6.2 4.6 5.0  HGB 9.2* 10.0* 11.0*  HCT 28.8* 29.9* 33.9*  PLT 351 393 354    COAGS:  Recent Labs  12/01/16 1217  INR 1.04    BMP:  Recent Labs  11/20/16 1310 12/01/16 1217 12/02/16 0411 04/15/17 1801  NA 141 142 138 140  K 3.8 3.7 4.7 3.7  CL 99 110 106 98  CO2 26 25 24 26   GLUCOSE 82 84 135* 115*  BUN 17 14 19 13   CALCIUM 8.8 9.2 8.6* 9.2  CREATININE 1.21* 1.26* 1.16* 1.25*  GFRNONAA 52* 48* 53* 50*  GFRAA 59* 56* >60 57*    LIVER FUNCTION TESTS:  Recent Labs  11/20/16 1310 04/15/17 1801  BILITOT <0.2 0.3  AST 15 18  ALT 11 13  ALKPHOS 51 55  PROT 7.7 8.0  ALBUMIN 4.1 4.2    TUMOR MARKERS: No results for input(s): AFPTM, CEA, CA199, CHROMGRNA in the last 8760 hours.  Assessment and Plan:  Heidi Herrera is a 51 y.o. (G1, P26) female who underwent a successful uterine fibroid embolization performed on 12/01/2016. She returns at interventional radiology clinic for postprocedural evaluation and management.  She is unaccompanied and serves as her own historian.  Since undergoing the uterine fibroid embolization on 12/02/2016, the patient reports significant improvement in her preprocedural fibroid related symptoms. She states that she missed 2 cycles over the summer but experienced a cycle in August of this year which she said was associated with only a  minimal amount of expected bleeding. She also reports significant improvement in her preprocedural frequent urination and nocturia.  Patient is overall pleased with the procedure and as such, post procedural imaging will not be necessary.  I explained to the patient that undergoing uterine fibroid embolization could expedite her passage into menopause.    All the patient's questions and concerns were addressed.  Patient was encouraged to call the interventional radiology clinic with any future questions or concerns and may otherwise return on a when necessary basis.   Thank you for this interesting consult.  I greatly enjoyed meeting Heidi Herrera and look forward to participating in their care.  A copy of this report was sent to the requesting provider on this date.  Electronically Signed: Sandi Mariscal 08/31/2017, 4:51 PM   I spent a total of 15 Minutes in face to face in clinical consultation, greater than 50% of which was counseling/coordinating care for post UFE.

## 2017-09-27 ENCOUNTER — Encounter: Payer: Self-pay | Admitting: Interventional Radiology

## 2017-12-22 ENCOUNTER — Telehealth: Payer: Self-pay | Admitting: Emergency Medicine

## 2017-12-22 NOTE — Telephone Encounter (Signed)
Copied from [place] [phone]. Topic: General - Other >> Dec 22, 2017  3:02 PM Patrice Paradise wrote: Reason for CRM: Patient called to see if the referral to [facility] can be updated and resent to the attention of Dana. The referral was done on 04/15/17.

## 2017-12-23 IMAGING — XA IR ANGIO/PELVIC SELECTIVE EA VESSEL
13 of 16 series · 13 of 24 positions shown · IV contrast (IODINE)
Comparison: none

INDICATION: Symptomatic uterine fibroids. Please refer to [REDACTED] consultation
performed on 05/20/2016 for additional details.

[Series 1: fl - angio · 1 of 26 frames shown]
[frame 4/26]
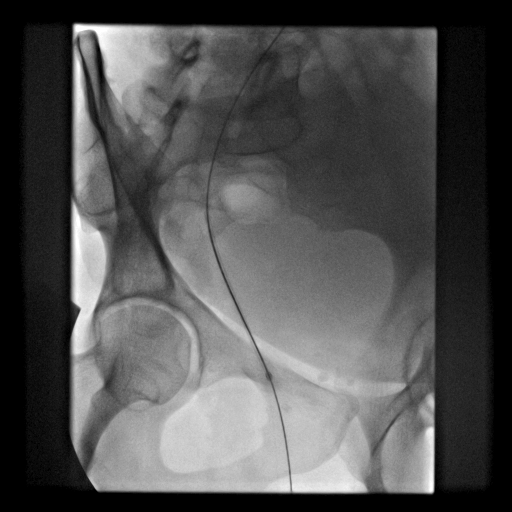

[Series 2: care body 4 · 1 of 15 frames shown (1 of 11)]
[frame 8/15]
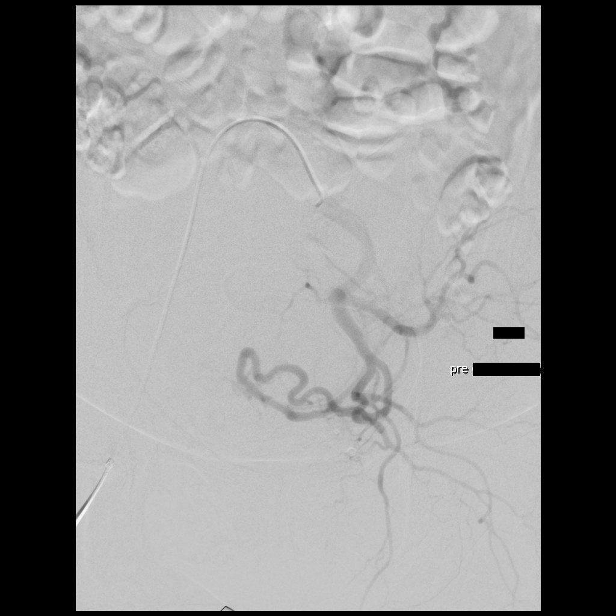

[Series 3: care body 4 · 1 of 16 frames shown (2 of 11)]
[frame 16/16]
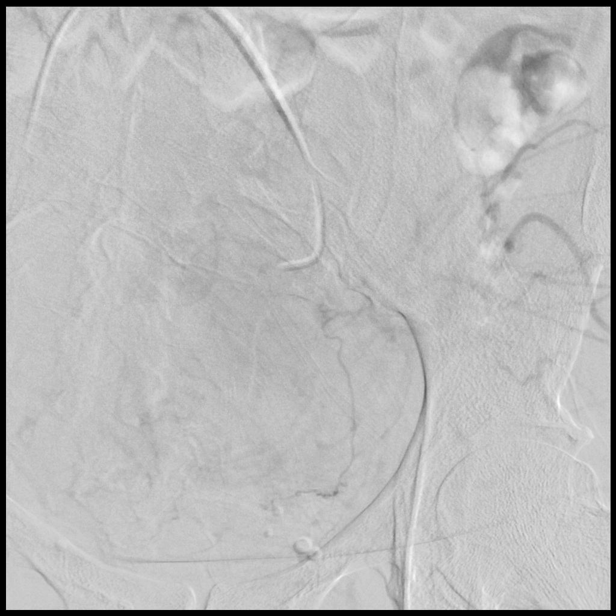

[Series 5: care body 4 · 1 of 26 frames shown (3 of 11)]
[frame 4/26]
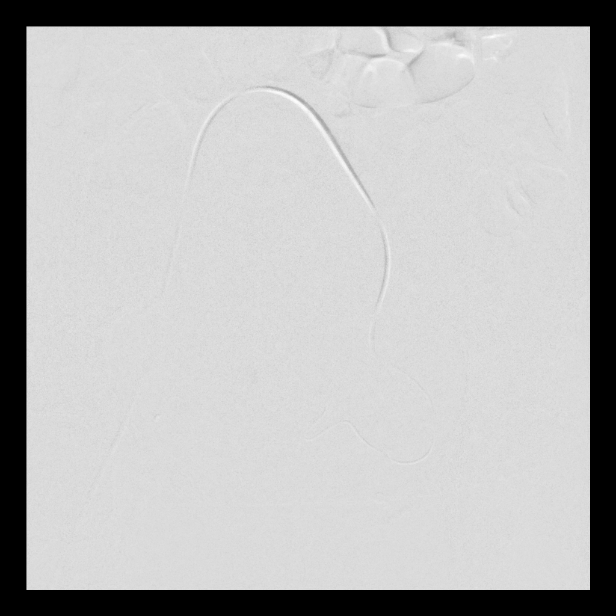

[Series 6: care body 4 · 1 of 32 frames shown (4 of 11)]
[frame 5/32]
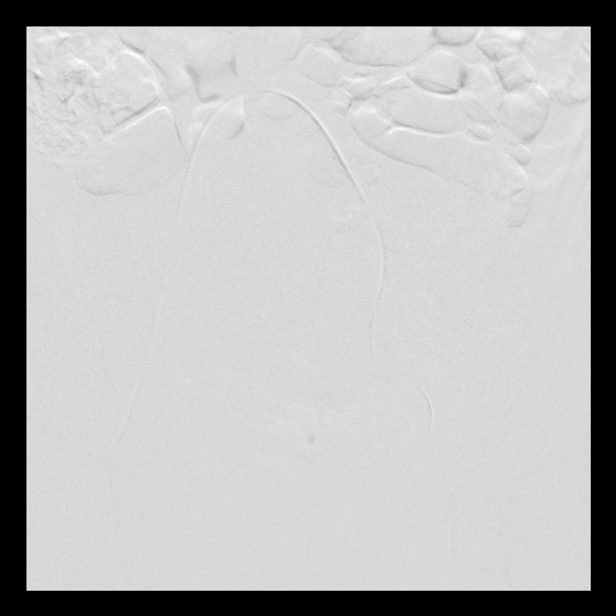

[Series 7: care body 4 · 1 of 23 frames shown (5 of 11)]
[frame 12/23]
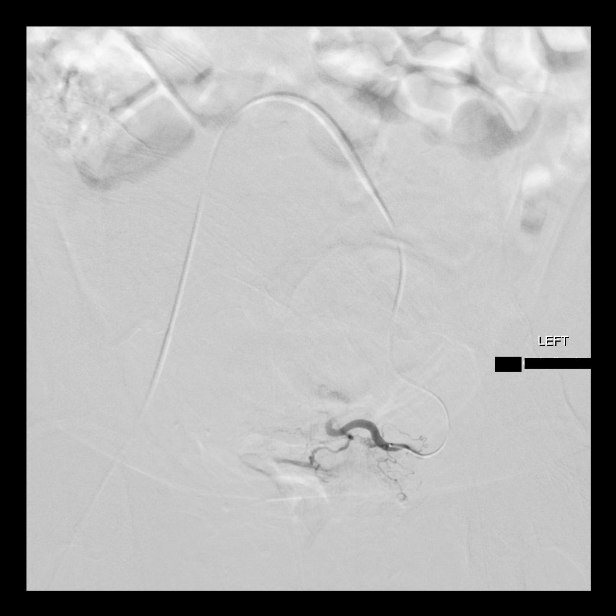

[Series 8: care body 4 · 1 of 24 frames shown (6 of 11)]
[frame 21/24]
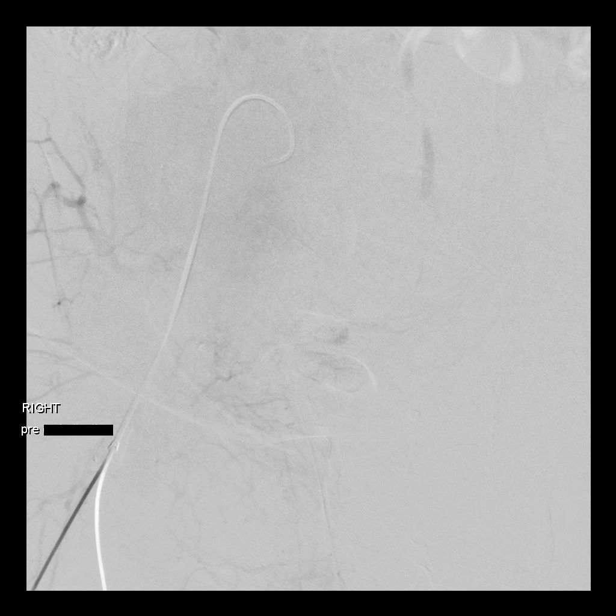

[Series 9: care body 4 · 1 of 24 frames shown (7 of 11)]
[frame 13/24]
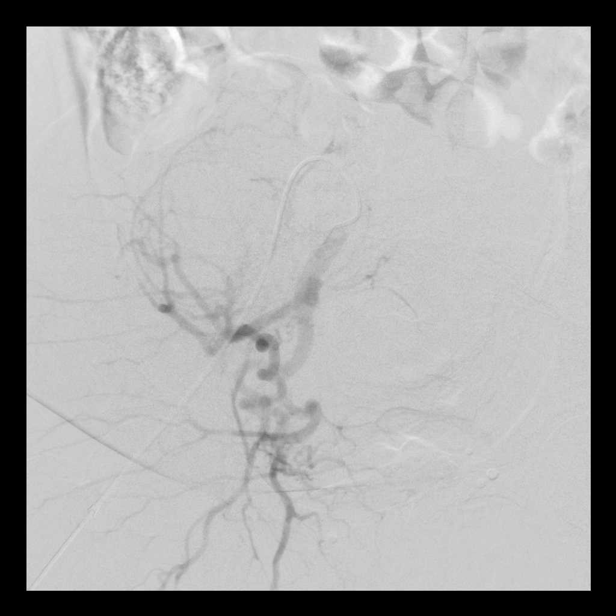

[Series 10: care body 4 · 1 of 22 frames shown (8 of 11)]
[frame 22/22]
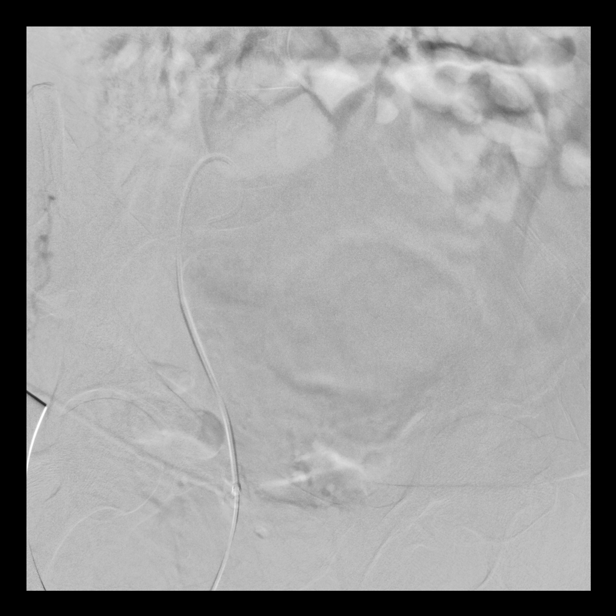

[Series 12: care body 4 · 1 of 22 frames shown (9 of 11)]
[frame 4/22]
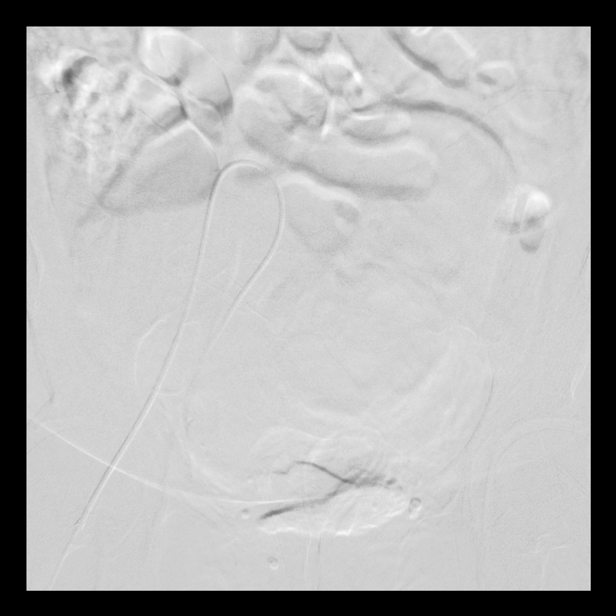

[Series 13: care body 4 · 1 of 18 frames shown (10 of 11)]
[frame 12/18]
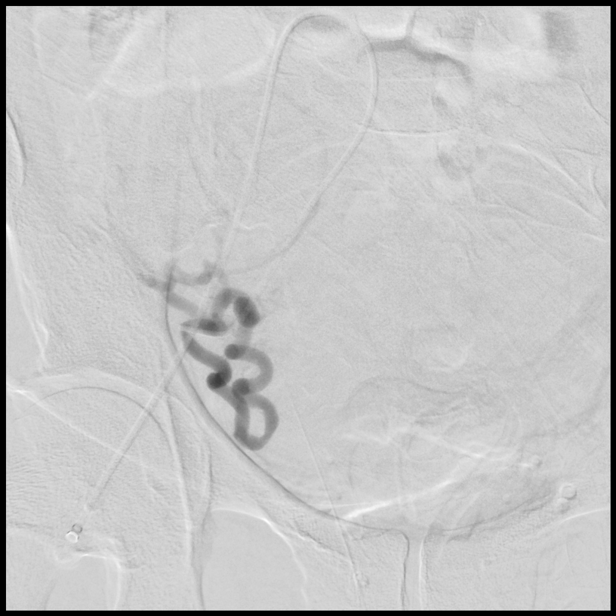

[Series 14: care body 4 · 1 of 24 frames shown (11 of 11)]
[frame 22/24]
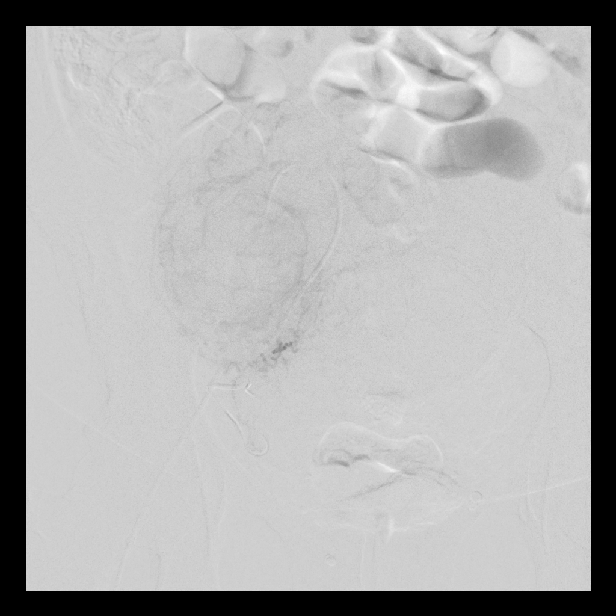

[Series 300: ld dsa body · 1 of 1 slices shown]
[im 1/1]
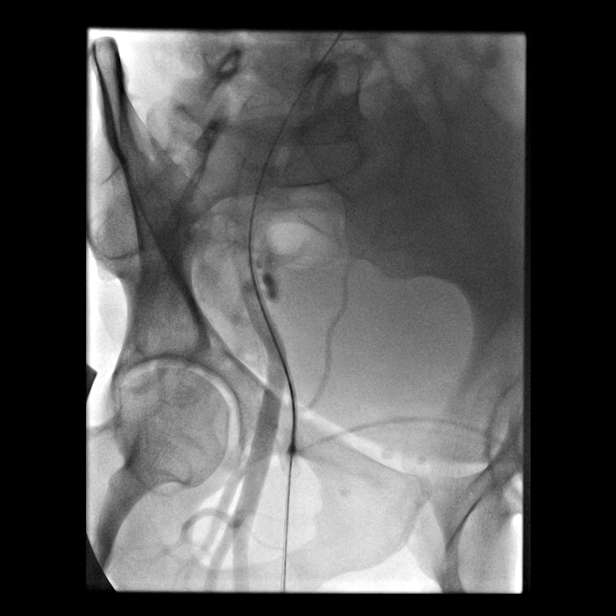

[13 of 24 positions shown; findings below may reference images not displayed]

EXAM:
Uterine Fibroid Embolization

MEDICATIONS:
Ancef 2 gm IV. The antibiotic was administered within one hour of
the procedure

ANESTHESIA/SEDATION:
Moderate (conscious) sedation was employed during this procedure. A
total of Versed 4.5 mg and Fentanyl 200 mcg was administered
intravenously.

Moderate Sedation Time: 74 minutes. The patient's level of
consciousness and vital signs were monitored continuously by
radiology nursing throughout the procedure under my direct
supervision.

CONTRAST:  120 cc Isovue 300

FLUOROSCOPY TIME:  23 minutes 48 seconds (1,923 mGy)

COMPLICATIONS:
None immediate.



The right femoral head was marked fluoroscopically. Under sterile
conditions and local anesthesia, the right common femoral artery
access was performed with a micropuncture needle. Under direct
ultrasound guidance, the right common femoral was accessed with a
micropuncture kit. An ultrasound image was saved for documentation
purposes. This allowed for placement of a 5-French vascular sheath.
A limited arteriogram was performed through the side arm of the
sheath confirming appropriate access within the right common femoral
artery.

The 5-French C2 catheter was utilized to select the contralateral
left internal iliac artery. Selective left internal iliac angiogram
was performed. The tortuous left uterine artery was identified.
Selective catheterization was performed of the left uterine artery
with a microcatheter and micro guide wire. A selective left uterine
angiogram was performed. This demonstrated patency of the left
uterine artery. Mild diffuse hypervascularity of the enlarged
fibroid uterus. Access was adequate for embolization. For
embolization, 3 vials of 500 - 700 micron and 2 vials of 700 - 900
micron Embospheres were injected into the left uterine artery. Post
embolization angiogram confirms complete stasis of the left uterine
vascular territory. Microcatheter was removed.

The C2 catheter was retracted and utilized to select the right
internal iliac artery. Selective right internal iliac angiogram was
performed. The patent right uterine artery was identified. For
selective catheterization, the micro catheter and guidewire were
utilized to select the right uterine artery. Selective right uterine
angiogram was performed. This demonstrated patency of the right
uterine artery. Catheter position was safe for embolization.
Embolization was performed to complete stasis with injection of 3
vials of 500-700 micron Embospheres. Post embolization angiogram
confirms complete stasis of the right uterine vascular territory.

At this point, all wires, catheters and sheaths were removed from
the patient. Hemostasis was achieved at the right groin access site
with

The patient tolerated the procedure well without immediate post
procedural complication.
FINDINGS: Bilateral internal iliac arteriograms demonstrates conventional
branching pattern with widely patent origins of the bilateral
uterine arteries.

Sub selective bilateral uterine arteriograms demonstrate co-dominant
supply to a hypertrophied myomatous uterus.

Completion arteriograms following bilateral uterine artery particle
embolization demonstrates a technically excellent result with stasis
of flow within the bilateral uterine vascular territories.
IMPRESSION: Successful bilateral uterine artery embolization ([facility]).

PLAN:
The patient will be seen in follow-up consultation at the
[REDACTED] in approximately 3-4 weeks.

## 2017-12-24 ENCOUNTER — Other Ambulatory Visit: Payer: Self-pay | Admitting: Emergency Medicine

## 2017-12-24 DIAGNOSIS — N289 Disorder of kidney and ureter, unspecified: Secondary | ICD-10-CM

## 2017-12-24 NOTE — Telephone Encounter (Signed)
Message sent to Referrals

## 2017-12-24 NOTE — Telephone Encounter (Signed)
Spoke with Museum/gallery conservator at [employer]. They will need a new referral placed and updated information with that due to first referral being so long ago. Please advise. Thanks!

## 2017-12-24 NOTE — Telephone Encounter (Signed)
Done. Thanks.

## 2017-12-31 ENCOUNTER — Ambulatory Visit: Payer: BC Managed Care – PPO | Admitting: Emergency Medicine

## 2018-01-14 ENCOUNTER — Telehealth: Payer: Self-pay | Admitting: *Deleted

## 2018-01-14 NOTE — Telephone Encounter (Signed)
Faxed Rx for Zyrtec 10 mg to [facility], refill request is denied the patient needs office visit for additional refill. Confirmation page received at 6:17 pm.

## 2018-01-21 ENCOUNTER — Ambulatory Visit: Payer: Self-pay

## 2018-01-21 NOTE — Telephone Encounter (Signed)
Pt calling c/o lightheadedness that has worsened in the past 2 weeks. Pt states she has had symptoms for over a year but has become more bothersome lately. Pt contributes sx with BP or BP meds. Pt states that the sx are worsens when she turns her head or stands up too quickly. Pt c/o bilateral ear drainage. Offered Saturday appt but pt only wants to see her PCP. Appt made for 01/25/18 0800 with Dr. Mitchel Honour.   Reason for Disposition . [1] MODERATE dizziness (e.g., interferes with normal activities) AND [2] has NOT been evaluated by physician for this  (Exception: dizziness caused by heat exposure, sudden standing, or poor fluid intake) . Taking a medicine that could cause dizziness (e.g., blood pressure medications, diuretics)  Answer Assessment - Initial Assessment Questions 1. DESCRIPTION: "Describe your dizziness."     Pt states that she is lightheadedness 2. LIGHTHEADED: "Do you feel lightheaded?" (e.g., somewhat faint, woozy, weak upon standing)     Woozy- states it feels "funny" feels wweak 3. VERTIGO: "Do you feel like either you or the room is spinning or tilting?" (i.e. vertigo)     no 4. SEVERITY: "How bad is it?"  "Do you feel like you are going to faint?" "Can you stand and walk?"   - MILD - walking normally   - MODERATE - interferes with normal activities (e.g., work, school)    - SEVERE - unable to stand, requires support to walk, feels like passing out now.      Can stand and walk, if she turns around too quickly and has when she stands up 5. ONSET:  "When did the dizziness begin?"     2 weeks ago 6. AGGRAVATING FACTORS: "Does anything make it worse?" (e.g., standing, change in head position)     Standing, change in head position 7. HEART RATE: "Can you tell me your heart rate?" "How many beats in 15 seconds?"  (Note: not all patients can do this)       Yes- 21 8. CAUSE: "What do you think is causing the dizziness?"     Doesn't know if its her BP  9. RECURRENT SYMPTOM: "Have  you had dizziness before?" If so, ask: "When was the last time?" "What happened that time?"     Yes-been going for a while per patient- over a year. Has iron deficiency anemia 10. OTHER SYMPTOMS: "Do you have any other symptoms?" (e.g., fever, chest pain, vomiting, diarrhea, bleeding)       Non/a 11. PREGNANCY: "Is there any chance you are pregnant?" "When was your last menstrual period?"       n/a  Protocols used: DIZZINESS [facility]

## 2018-01-25 ENCOUNTER — Ambulatory Visit: Payer: BC Managed Care – PPO | Admitting: Emergency Medicine

## 2018-01-25 ENCOUNTER — Encounter: Payer: Self-pay | Admitting: Emergency Medicine

## 2018-01-25 VITALS — BP 126/76 | HR 87 | Temp 98.7°F | Resp 17 | Ht 66.5 in | Wt 205.0 lb

## 2018-01-25 DIAGNOSIS — Z1231 Encounter for screening mammogram for malignant neoplasm of breast: Secondary | ICD-10-CM

## 2018-01-25 DIAGNOSIS — D649 Anemia, unspecified: Secondary | ICD-10-CM

## 2018-01-25 DIAGNOSIS — N183 Chronic kidney disease, stage 3 unspecified: Secondary | ICD-10-CM

## 2018-01-25 DIAGNOSIS — Z6832 Body mass index (BMI) 32.0-32.9, adult: Secondary | ICD-10-CM | POA: Insufficient documentation

## 2018-01-25 DIAGNOSIS — R42 Dizziness and giddiness: Secondary | ICD-10-CM | POA: Insufficient documentation

## 2018-01-25 DIAGNOSIS — F4323 Adjustment disorder with mixed anxiety and depressed mood: Secondary | ICD-10-CM | POA: Insufficient documentation

## 2018-01-25 DIAGNOSIS — N920 Excessive and frequent menstruation with regular cycle: Secondary | ICD-10-CM | POA: Insufficient documentation

## 2018-01-25 DIAGNOSIS — Z1239 Encounter for other screening for malignant neoplasm of breast: Secondary | ICD-10-CM | POA: Insufficient documentation

## 2018-01-25 LAB — POCT CBC
Granulocyte percent: 64.8 %G (ref 37–80)
HCT, POC: 35.7 % — AB (ref 37.7–47.9)
HEMOGLOBIN: 11.9 g/dL — AB (ref 12.2–16.2)
LYMPH, POC: 1.1 (ref 0.6–3.4)
MCH, POC: 28.6 pg (ref 27–31.2)
MCHC: 33.4 g/dL (ref 31.8–35.4)
MCV: 85.6 fL (ref 80–97)
MID (CBC): 0.2 (ref 0–0.9)
MPV: 8.1 fL (ref 0–99.8)
PLATELET COUNT, POC: 237 10*3/uL (ref 142–424)
POC Granulocyte: 2.5 (ref 2–6.9)
POC LYMPH PERCENT: 30.2 %L (ref 10–50)
POC MID %: 5 %M (ref 0–12)
RBC: 4.17 M/uL (ref 4.04–5.48)
RDW, POC: 12.9 %
WBC: 3.8 10*3/uL — AB (ref 4.6–10.2)

## 2018-01-25 NOTE — Patient Instructions (Addendum)
IF you received an x-ray today, you will receive an invoice from [facility]. Please contact [facility] at [phone] with questions or concerns regarding your invoice.   IF you received labwork today, you will receive an invoice from [facility]. Please contact [facility] at [phone] with questions or concerns regarding your invoice.   Our billing staff will not be able to assist you with questions regarding bills from these companies.  You will be contacted with the lab results as soon as they are available. The fastest way to get your results is to activate your My Chart account. Instructions are located on the last page of this paperwork. If you have not heard from [place] regarding the results in 2 weeks, please contact this office.       Calorie Counting for Weight Loss Calories are units of energy. Your body needs a certain amount of calories from food to keep you going throughout the day. When you eat more calories than your body needs, your body stores the extra calories as fat. When you eat fewer calories than your body needs, your body burns fat to get the energy it needs. Calorie counting means keeping track of how many calories you eat and drink each day. Calorie counting can be helpful if you need to lose weight. If you make sure to eat fewer calories than your body needs, you should lose weight. Ask your health care provider what a healthy weight is for you. For calorie counting to work, you will need to eat the right number of calories in a day in order to lose a healthy amount of weight per week. A dietitian can help you determine how many calories you need in a day and will give you suggestions on how to reach your calorie goal.  A healthy amount of weight to lose per week is usually 1-2 lb (0.5-0.9 kg). This usually means that your daily calorie intake should be reduced by 500-750 calories.  Eating 1,200 - 1,500 calories per day can help most women lose  weight.  Eating 1,500 - 1,800 calories per day can help most men lose weight.  What is my plan? My goal is to have __________ calories per day. If I have this many calories per day, I should lose around __________ pounds per week. What do I need to know about calorie counting? In order to meet your daily calorie goal, you will need to:  Find out how many calories are in each food you would like to eat. Try to do this before you eat.  Decide how much of the food you plan to eat.  Write down what you ate and how many calories it had. Doing this is called keeping a food log.  To successfully lose weight, it is important to balance calorie counting with a healthy lifestyle that includes regular activity. Aim for 150 minutes of moderate exercise (such as walking) or 75 minutes of vigorous exercise (such as running) each week. Where do I find calorie information?  The number of calories in a food can be found on a Nutrition Facts label. If a food does not have a Nutrition Facts label, try to look up the calories online or ask your dietitian for help. Remember that calories are listed per serving. If you choose to have more than one serving of a food, you will have to multiply the calories per serving by the amount of servings you plan to eat. For example, the label on  a package of bread might say that a serving size is 1 slice and that there are 90 calories in a serving. If you eat 1 slice, you will have eaten 90 calories. If you eat 2 slices, you will have eaten 180 calories. How do I keep a food log? Immediately after each meal, record the following information in your food log:  What you ate. Don't forget to include toppings, sauces, and other extras on the food.  How much you ate. This can be measured in cups, ounces, or number of items.  How many calories each food and drink had.  The total number of calories in the meal.  Keep your food log near you, such as in a small notebook in  your pocket, or use a mobile app or website. Some programs will calculate calories for you and show you how many calories you have left for the day to meet your goal. What are some calorie counting tips?  Use your calories on foods and drinks that will fill you up and not leave you hungry: ? Some examples of foods that fill you up are nuts and nut butters, vegetables, lean proteins, and high-fiber foods like whole grains. High-fiber foods are foods with more than 5 g fiber per serving. ? Drinks such as sodas, specialty coffee drinks, alcohol, and juices have a lot of calories, yet do not fill you up.  Eat nutritious foods and avoid empty calories. Empty calories are calories you get from foods or beverages that do not have many vitamins or protein, such as candy, sweets, and soda. It is better to have a nutritious high-calorie food (such as an avocado) than a food with few nutrients (such as a bag of chips).  Know how many calories are in the foods you eat most often. This will help you calculate calorie counts faster.  Pay attention to calories in drinks. Low-calorie drinks include water and unsweetened drinks.  Pay attention to nutrition labels for "low fat" or "fat free" foods. These foods sometimes have the same amount of calories or more calories than the full fat versions. They also often have added sugar, starch, or salt, to make up for flavor that was removed with the fat.  Find a way of tracking calories that works for you. Get creative. Try different apps or programs if writing down calories does not work for you. What are some portion control tips?  Know how many calories are in a serving. This will help you know how many servings of a certain food you can have.  Use a measuring cup to measure serving sizes. You could also try weighing out portions on a kitchen scale. With time, you will be able to estimate serving sizes for some foods.  Take some time to put servings of different  foods on your favorite plates, bowls, and cups so you know what a serving looks like.  Try not to eat straight from a bag or box. Doing this can lead to overeating. Put the amount you would like to eat in a cup or on a plate to make sure you are eating the right portion.  Use smaller plates, glasses, and bowls to prevent overeating.  Try not to multitask (for example, watch TV or use your computer) while eating. If it is time to eat, sit down at a table and enjoy your food. This will help you to know when you are full. It will also help you to be aware of what  you are eating and how much you are eating. What are tips for following this plan? Reading food labels  Check the calorie count compared to the serving size. The serving size may be smaller than what you are used to eating.  Check the source of the calories. Make sure the food you are eating is high in vitamins and protein and low in saturated and trans fats. Shopping  Read nutrition labels while you shop. This will help you make healthy decisions before you decide to purchase your food.  Make a grocery list and stick to it. Cooking  Try to cook your favorite foods in a healthier way. For example, try baking instead of frying.  Use low-fat dairy products. Meal planning  Use more fruits and vegetables. Half of your plate should be fruits and vegetables.  Include lean proteins like poultry and fish. How do I count calories when eating out?  Ask for smaller portion sizes.  Consider sharing an entree and sides instead of getting your own entree.  If you get your own entree, eat only half. Ask for a box at the beginning of your meal and put the rest of your entree in it so you are not tempted to eat it.  If calories are listed on the menu, choose the lower calorie options.  Choose dishes that include vegetables, fruits, whole grains, low-fat dairy products, and lean protein.  Choose items that are boiled, broiled, grilled, or  steamed. Stay away from items that are buttered, battered, fried, or served with cream sauce. Items labeled "crispy" are usually fried, unless stated otherwise.  Choose water, low-fat milk, unsweetened iced tea, or other drinks without added sugar. If you want an alcoholic beverage, choose a lower calorie option such as a glass of wine or light beer.  Ask for dressings, sauces, and syrups on the side. These are usually high in calories, so you should limit the amount you eat.  If you want a salad, choose a garden salad and ask for grilled meats. Avoid extra toppings like bacon, cheese, or fried items. Ask for the dressing on the side, or ask for olive oil and vinegar or lemon to use as dressing.  Estimate how many servings of a food you are given. For example, a serving of cooked rice is  cup or about the size of half a baseball. Knowing serving sizes will help you be aware of how much food you are eating at restaurants. The list below tells you how big or small some common portion sizes are based on everyday objects: ? 1 oz-4 stacked dice. ? 3 oz-1 deck of cards. ? 1 tsp-1 die. ? 1 Tbsp- a ping-pong ball. ? 2 Tbsp-1 ping-pong ball. ?  cup- baseball. ? 1 cup-1 baseball. Summary  Calorie counting means keeping track of how many calories you eat and drink each day. If you eat fewer calories than your body needs, you should lose weight.  A healthy amount of weight to lose per week is usually 1-2 lb (0.5-0.9 kg). This usually means reducing your daily calorie intake by 500-750 calories.  The number of calories in a food can be found on a Nutrition Facts label. If a food does not have a Nutrition Facts label, try to look up the calories online or ask your dietitian for help.  Use your calories on foods and drinks that will fill you up, and not on foods and drinks that will leave you hungry.  Use smaller plates, glasses,  and bowls to prevent overeating. This information is not intended to  replace advice given to you by your health care provider. Make sure you discuss any questions you have with your health care provider. Document Released: 11/30/2005 Document Revised: 10/30/2016 Document Reviewed: 10/30/2016 Elsevier Interactive Patient Education  2018 [employer]. Dizziness Dizziness is a common problem. It is a feeling of unsteadiness or light-headedness. You may feel like you are about to faint. Dizziness can lead to injury if you stumble or fall. Anyone can become dizzy, but dizziness is more common in older adults. This condition can be caused by a number of things, including medicines, dehydration, or illness. Follow these instructions at home: Eating and drinking  Drink enough fluid to keep your urine clear or pale yellow. This helps to keep you from becoming dehydrated. Try to drink more clear fluids, such as water.  Do not drink alcohol.  Limit your caffeine intake if told to do so by your health care provider. Check ingredients and nutrition facts to see if a food or beverage contains caffeine.  Limit your salt (sodium) intake if told to do so by your health care provider. Check ingredients and nutrition facts to see if a food or beverage contains sodium. Activity  Avoid making quick movements. ? Rise slowly from chairs and steady yourself until you feel okay. ? In the morning, first sit up on the side of the bed. When you feel okay, stand slowly while you hold onto something until you know that your balance is fine.  If you need to stand in one place for a long time, move your legs often. Tighten and relax the muscles in your legs while you are standing.  Do not drive or use heavy machinery if you feel dizzy.  Avoid bending down if you feel dizzy. Place items in your home so that they are easy for you to reach without leaning over. Lifestyle  Do not use any products that contain nicotine or tobacco, such as cigarettes and e-cigarettes. If you need help  quitting, ask your health care provider.  Try to reduce your stress level by using methods such as yoga or meditation. Talk with your health care provider if you need help to manage your stress. General instructions  Watch your dizziness for any changes.  Take over-the-counter and prescription medicines only as told by your health care provider. Talk with your health care provider if you think that your dizziness is caused by a medicine that you are taking.  Tell a friend or a family member that you are feeling dizzy. If he or she notices any changes in your behavior, have this person call your health care provider.  Keep all follow-up visits as told by your health care provider. This is important. Contact a health care provider if:  Your dizziness does not go away.  Your dizziness or light-headedness gets worse.  You feel nauseous.  You have reduced hearing.  You have new symptoms.  You are unsteady on your feet or you feel like the room is spinning. Get help right away if:  You vomit or have diarrhea and are unable to eat or drink anything.  You have problems talking, walking, swallowing, or using your arms, hands, or legs.  You feel generally weak.  You are not thinking clearly or you have trouble forming sentences. It may take a friend or family member to notice this.  You have chest pain, abdominal pain, shortness of breath, or sweating.  Your  vision changes.  You have any bleeding.  You have a severe headache.  You have neck pain or a stiff neck.  You have a fever. These symptoms may represent a serious problem that is an emergency. Do not wait to see if the symptoms will go away. Get medical help right away. Call your local emergency services (911 in the [place] Do not drive yourself to the hospital. Summary  Dizziness is a feeling of unsteadiness or light-headedness. This condition can be caused by a number of things, including medicines, dehydration, or  illness.  Anyone can become dizzy, but dizziness is more common in older adults.  Drink enough fluid to keep your urine clear or pale yellow. Do not drink alcohol.  Avoid making quick movements if you feel dizzy. Monitor your dizziness for any changes. This information is not intended to replace advice given to you by your health care provider. Make sure you discuss any questions you have with your health care provider. Document Released: 05/26/2001 Document Revised: 01/02/2017 Document Reviewed: 01/02/2017 Elsevier Interactive Patient Education  [employer].

## 2018-01-25 NOTE — Progress Notes (Signed)
Heidi Herrera 52 y.o.   Chief Complaint  Patient presents with  . Dizziness    HISTORY OF PRESENT ILLNESS: This is a 52 y.o. female complaining of lightheadedness for several months.  Medical record reviewed.  Labs reviewed.  Patient has a history of chronic anemia and chronic kidney disease.  Also complaining of heavy periods.  Has a history of uterine fibroids status post embolization in 2017.  Denies syncope.  Denies flulike symptoms.  Denies fever or chills.  On no new medications.  Denies rectal bleeding or melena.  Was referred to kidney doctor twice in the last year but never followed up.  Referral expired.  No other significant symptoms.  Patient also dealing with the stress of recent personal loss and also employment loss.  This has created a lot of emotional stress leading to anxiety and depression.  HPI   Prior to Admission medications   Medication Sig Start Date End Date Taking? Authorizing Provider  Ascorbic Acid (VITAMIN C) 1000 MG tablet Take 2,000 mg by mouth 2 (two) times daily.    Yes [provider]  Biotin 1000 MCG tablet Take 1,000 mcg by mouth daily.    Yes [provider]  calcium-vitamin D (OSCAL WITH D) 500-200 MG-UNIT per tablet Take 1 tablet by mouth 2 (two) times daily. Calcium 1000 mg with Magnesium 400 mg and zinc 15 mg, taking 1 capsule 2 times daily   Yes [provider]  cetirizine (ZYRTEC) 10 MG tablet Take 1 tablet (10 mg total) by mouth daily. 02/13/16  Yes Posey Boyer, MD  co-enzyme Q-10 30 MG capsule Take 200 mg by mouth daily.   Yes [provider]  Cyanocobalamin (VITAMIN B 12 PO) Take 1 tablet by mouth daily.   Yes [provider]  iron polysaccharides (NIFEREX) 150 MG capsule Take 150 mg by mouth daily.   Yes [provider]  lisinopril-hydrochlorothiazide (PRINZIDE,ZESTORETIC) 20-25 MG tablet Take 1 tablet by mouth daily. 06/30/17  Yes Burley Kopka, Ines Bloomer, MD  Multiple Vitamins-Minerals  (ALIVE ONCE DAILY WOMENS 50+ PO) Take 1 tablet by mouth daily.   Yes [provider]  Omega 3-6-9 Fatty Acids (OMEGA-3-6-9 PO) Take 1 capsule by mouth daily.   Yes [provider]  psyllium (METAMUCIL) 58.6 % powder Take 1 packet by mouth daily.    Yes [provider]  triamcinolone cream (KENALOG) 0.1 % Apply 1 application topically 2 (two) times daily. 05/19/17  Yes Horald Pollen, MD    Allergies  Allergen Reactions  . Bystolic [Nebivolol Hcl] Anaphylaxis, Itching and Swelling  . Biaxin [Clarithromycin]     The category of the Biaxin family.  Also allergic to water chestnuts.  . Erythromycin Base Rash    Patient Active Problem List   Diagnosis Date Noted  . Irritant contact dermatitis 05/19/2017  . Pain in both feet 05/19/2017  . Chronic kidney disease 04/15/2017  . Sore throat 03/23/2017  . Viral pharyngitis 03/23/2017  . Acute URI 03/23/2017  . Fibroid 12/01/2016  . Fibroid, uterine   . Essential hypertension 09/29/2014  . Anemia 02/02/2014  . VITAMIN D DEFICIENCY 12/18/2010  . HYPERLIPIDEMIA 12/18/2010  . CHEST PAIN 12/18/2010  . NEPHROLITHIASIS 03/24/2010  . 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 GRANULOMA 10/09/2009  . ESOPHAGEAL STRICTURE 08/27/2009  . GERD 08/27/2009  . Anxiety state 04/20/2008  . HYPERTENSION 04/20/2008  . OBESITY, NOS 02/10/2007  . HYPERTENSION, BENIGN SYSTEMIC 02/10/2007  . FIBROADENOSIS, BREAST 02/10/2007    Past Medical History:  Diagnosis Date  . Acid reflux   . Allergy   . Anemia   . Anxiety   . GERD (gastroesophageal reflux disease)   . Hypertension   . Obesity   . Sebaceous cyst     Past Surgical History:  Procedure Laterality Date  . IR GENERIC HISTORICAL  12/01/2016   IR ANGIOGRAM PELVIS SELECTIVE OR SUPRASELECTIVE 12/01/2016 Sandi Mariscal, MD [facility]  . IR GENERIC HISTORICAL  12/01/2016   IR US GUIDE VASC ACCESS RIGHT 12/01/2016 Sandi Mariscal, MD [facility]  . IR GENERIC HISTORICAL  12/01/2016    IR EMBO ARTERIAL NOT HEMORR HEMANG INC GUIDE ROADMAPPING 12/01/2016 Sandi Mariscal, MD [facility]  . IR GENERIC HISTORICAL  12/01/2016   IR ANGIOGRAM SELECTIVE EACH ADDITIONAL VESSEL 12/01/2016 Sandi Mariscal, MD [facility]  . IR GENERIC HISTORICAL  12/01/2016   IR ANGIOGRAM SELECTIVE EACH ADDITIONAL VESSEL 12/01/2016 Sandi Mariscal, MD [facility]  . IR GENERIC HISTORICAL  12/01/2016   IR ANGIOGRAM PELVIS SELECTIVE OR SUPRASELECTIVE 12/01/2016 Sandi Mariscal, MD [facility]  . IR GENERIC HISTORICAL  01/07/2017   IR RADIOLOGIST EVAL & MGMT 01/07/2017 Sandi Mariscal, MD [facility]  . IR RADIOLOGIST EVAL & MGMT  08/31/2017  . LOBECTOMY  2009  . MYOMECTOMY  2007  . UTERINE FIBROID EMBOLIZATION     12/01/2016    Social History   Socioeconomic History  . Marital status: Divorced    Spouse name: Not on file  . Number of children: Not on file  . Years of education: Not on file  . Highest education level: Not on file  Social Needs  . Financial resource strain: Not on file  . Food insecurity - worry: Not on file  . Food insecurity - inability: Not on file  . Transportation needs - medical: Not on file  . Transportation needs - non-medical: Not on file  Occupational History  . Not on file  Tobacco Use  . Smoking status: Never Smoker  . Smokeless tobacco: Never Used  Substance and Sexual Activity  . Alcohol use: Yes    Alcohol/week: 0.6 oz    Types: 1 Glasses of wine per week  . Drug use: No  . Sexual activity: Not on file  Other Topics Concern  . Not on file  Social History Narrative  . Not on file    Family History  Problem Relation Age of Onset  . Hypertension Mother   . Obesity Mother   . Stroke Mother   . Diabetes Mother   . Hypertension Sister   . Hyperlipidemia Sister   . Obesity Sister   . Diabetes Sister   . Heart disease Sister   . Mental illness Sister   . Hypertension Brother   . Obesity Brother   . Stroke Brother   . Mental illness Father   . Hypertension  Father   . Cancer Brother   . Stroke Brother   . Diabetes Sister   . Hypertension Sister   . Mental illness Sister      Review of Systems  Constitutional: Positive for malaise/fatigue. Negative for chills, fever and weight loss.  HENT: Negative.  Negative for congestion and nosebleeds.   Eyes: Negative.   Respiratory: Negative.  Negative for cough, hemoptysis and shortness of breath.   Cardiovascular: Negative.  Negative for chest pain, palpitations and leg swelling.  Gastrointestinal: Negative.  Negative for abdominal pain, diarrhea, nausea and vomiting.  Genitourinary: Negative.  Negative for dysuria and hematuria.  Musculoskeletal: Negative.  Negative for back pain, myalgias and neck pain.  Skin: Negative for rash.  Neurological: Positive for weakness. Negative for dizziness, sensory change, focal weakness and headaches.  Endo/Heme/Allergies: Negative.   All other systems reviewed and are negative.  Vitals:   01/25/18 0808  BP: 126/76  Pulse: 87  Resp: 17  Temp: 98.7 F (37.1 C)  SpO2: 98%     Orthostatic VS for the past 24 hrs:  BP- Lying Pulse- Lying BP- Sitting Pulse- Sitting BP- Standing at 0 minutes Pulse- Standing at 0 minutes  01/25/18 0841 116/77 71 125/84 75 131/87 73      Physical Exam  Constitutional: She is oriented to person, place, and time. She appears well-developed and well-nourished.  HENT:  Head: Normocephalic and atraumatic.  Right Ear: External ear normal.  Left Ear: External ear normal.  Nose: Nose normal.  Mouth/Throat: Oropharynx is clear and moist.  Eyes: Conjunctivae and EOM are normal. Pupils are equal, round, and reactive to light.  Neck: Normal range of motion. Neck supple. No JVD present. No thyromegaly present.  Cardiovascular: Normal rate, regular rhythm and normal heart sounds.  Pulmonary/Chest: Effort normal and breath sounds normal. No respiratory distress.  Abdominal: Soft. Bowel sounds are normal. She exhibits no distension.  There is no tenderness.  Musculoskeletal: Normal range of motion.  Lymphadenopathy:    She has no cervical adenopathy.  Neurological: She is alert and oriented to person, place, and time. No sensory deficit. She exhibits normal muscle tone.  Skin: Skin is warm and dry. Capillary refill takes less than 2 seconds. No rash noted.  Psychiatric: She has a normal mood and affect. Her behavior is normal.  Vitals reviewed.  Results for orders placed or performed in visit on 01/25/18 (from the past 24 hour(s))  POCT CBC     Status: Abnormal   Collection Time: 01/25/18  8:48 AM  Result Value Ref Range   WBC 3.8 (A) 4.6 - 10.2 K/uL   Lymph, poc 1.1 0.6 - 3.4   POC LYMPH PERCENT 30.2 10 - 50 %L   MID (cbc) 0.2 0 - 0.9   POC MID % 5.0 0 - 12 %M   POC Granulocyte 2.5 2 - 6.9   Granulocyte percent 64.8 37 - 80 %G   RBC 4.17 4.04 - 5.48 M/uL   Hemoglobin 11.9 (A) 12.2 - 16.2 g/dL   HCT, POC 35.7 (A) 37.7 - 47.9 %   MCV 85.6 80 - 97 fL   MCH, POC 28.6 27 - 31.2 pg   MCHC 33.4 31.8 - 35.4 g/dL   RDW, POC 12.9 %   Platelet Count, POC 237 142 - 424 K/uL   MPV 8.1 0 - 99.8 fL     ASSESSMENT & PLAN: Haruna was seen today for dizziness.  Diagnoses and all orders for this visit:  Lightheadedness -     POCT CBC -     Comprehensive metabolic panel -     Hemoglobin A1c -     HIV antibody -     Hepatitis C antibody -     Orthostatic vital signs -     Ambulatory referral to Nephrology  Breast cancer screening -     MM Digital Screening; Future  CKD (chronic kidney disease) stage 3, GFR 30-59 ml/min (HCC) -     Ambulatory referral to Nephrology  Chronic anemia  Menorrhagia with regular cycle  Body mass index (BMI) of 32.0-32.9 in adult -     Amb ref to  Medical Nutrition Therapy-MNT  Situational mixed anxiety and depressive disorder -     Ambulatory referral to Psychiatry      Patient Instructions       IF you received an x-ray today, you will receive an invoice from  [facility]. Please contact [facility] at [phone] with questions or concerns regarding your invoice.   IF you received labwork today, you will receive an invoice from [facility]. Please contact [facility] at [phone] with questions or concerns regarding your invoice.   Our billing staff will not be able to assist you with questions regarding bills from these companies.  You will be contacted with the lab results as soon as they are available. The fastest way to get your results is to activate your My Chart account. Instructions are located on the last page of this paperwork. If you have not heard from [place] regarding the results in 2 weeks, please contact this office.       Calorie Counting for Weight Loss Calories are units of energy. Your body needs a certain amount of calories from food to keep you going throughout the day. When you eat more calories than your body needs, your body stores the extra calories as fat. When you eat fewer calories than your body needs, your body burns fat to get the energy it needs. Calorie counting means keeping track of how many calories you eat and drink each day. Calorie counting can be helpful if you need to lose weight. If you make sure to eat fewer calories than your body needs, you should lose weight. Ask your health care provider what a healthy weight is for you. For calorie counting to work, you will need to eat the right number of calories in a day in order to lose a healthy amount of weight per week. A dietitian can help you determine how many calories you need in a day and will give you suggestions on how to reach your calorie goal.  A healthy amount of weight to lose per week is usually 1-2 lb (0.5-0.9 kg). This usually means that your daily calorie intake should be reduced by 500-750 calories.  Eating 1,200 - 1,500 calories per day can help most women lose weight.  Eating 1,500 - 1,800 calories per day can help most men lose  weight.  What is my plan? My goal is to have __________ calories per day. If I have this many calories per day, I should lose around __________ pounds per week. What do I need to know about calorie counting? In order to meet your daily calorie goal, you will need to:  Find out how many calories are in each food you would like to eat. Try to do this before you eat.  Decide how much of the food you plan to eat.  Write down what you ate and how many calories it had. Doing this is called keeping a food log.  To successfully lose weight, it is important to balance calorie counting with a healthy lifestyle that includes regular activity. Aim for 150 minutes of moderate exercise (such as walking) or 75 minutes of vigorous exercise (such as running) each week. Where do I find calorie information?  The number of calories in a food can be found on a Nutrition Facts label. If a food does not have a Nutrition Facts label, try to look up the calories online or ask your dietitian for help. Remember that calories are listed per serving. If you choose to have  more than one serving of a food, you will have to multiply the calories per serving by the amount of servings you plan to eat. For example, the label on a package of bread might say that a serving size is 1 slice and that there are 90 calories in a serving. If you eat 1 slice, you will have eaten 90 calories. If you eat 2 slices, you will have eaten 180 calories. How do I keep a food log? Immediately after each meal, record the following information in your food log:  What you ate. Don't forget to include toppings, sauces, and other extras on the food.  How much you ate. This can be measured in cups, ounces, or number of items.  How many calories each food and drink had.  The total number of calories in the meal.  Keep your food log near you, such as in a small notebook in your pocket, or use a mobile app or website. Some programs will calculate  calories for you and show you how many calories you have left for the day to meet your goal. What are some calorie counting tips?  Use your calories on foods and drinks that will fill you up and not leave you hungry: ? Some examples of foods that fill you up are nuts and nut butters, vegetables, lean proteins, and high-fiber foods like whole grains. High-fiber foods are foods with more than 5 g fiber per serving. ? Drinks such as sodas, specialty coffee drinks, alcohol, and juices have a lot of calories, yet do not fill you up.  Eat nutritious foods and avoid empty calories. Empty calories are calories you get from foods or beverages that do not have many vitamins or protein, such as candy, sweets, and soda. It is better to have a nutritious high-calorie food (such as an avocado) than a food with few nutrients (such as a bag of chips).  Know how many calories are in the foods you eat most often. This will help you calculate calorie counts faster.  Pay attention to calories in drinks. Low-calorie drinks include water and unsweetened drinks.  Pay attention to nutrition labels for "low fat" or "fat free" foods. These foods sometimes have the same amount of calories or more calories than the full fat versions. They also often have added sugar, starch, or salt, to make up for flavor that was removed with the fat.  Find a way of tracking calories that works for you. Get creative. Try different apps or programs if writing down calories does not work for you. What are some portion control tips?  Know how many calories are in a serving. This will help you know how many servings of a certain food you can have.  Use a measuring cup to measure serving sizes. You could also try weighing out portions on a kitchen scale. With time, you will be able to estimate serving sizes for some foods.  Take some time to put servings of different foods on your favorite plates, bowls, and cups so you know what a serving  looks like.  Try not to eat straight from a bag or box. Doing this can lead to overeating. Put the amount you would like to eat in a cup or on a plate to make sure you are eating the right portion.  Use smaller plates, glasses, and bowls to prevent overeating.  Try not to multitask (for example, watch TV or use your computer) while eating. If it is time to  eat, sit down at a table and enjoy your food. This will help you to know when you are full. It will also help you to be aware of what you are eating and how much you are eating. What are tips for following this plan? Reading food labels  Check the calorie count compared to the serving size. The serving size may be smaller than what you are used to eating.  Check the source of the calories. Make sure the food you are eating is high in vitamins and protein and low in saturated and trans fats. Shopping  Read nutrition labels while you shop. This will help you make healthy decisions before you decide to purchase your food.  Make a grocery list and stick to it. Cooking  Try to cook your favorite foods in a healthier way. For example, try baking instead of frying.  Use low-fat dairy products. Meal planning  Use more fruits and vegetables. Half of your plate should be fruits and vegetables.  Include lean proteins like poultry and fish. How do I count calories when eating out?  Ask for smaller portion sizes.  Consider sharing an entree and sides instead of getting your own entree.  If you get your own entree, eat only half. Ask for a box at the beginning of your meal and put the rest of your entree in it so you are not tempted to eat it.  If calories are listed on the menu, choose the lower calorie options.  Choose dishes that include vegetables, fruits, whole grains, low-fat dairy products, and lean protein.  Choose items that are boiled, broiled, grilled, or steamed. Stay away from items that are buttered, battered, fried, or  served with cream sauce. Items labeled "crispy" are usually fried, unless stated otherwise.  Choose water, low-fat milk, unsweetened iced tea, or other drinks without added sugar. If you want an alcoholic beverage, choose a lower calorie option such as a glass of wine or light beer.  Ask for dressings, sauces, and syrups on the side. These are usually high in calories, so you should limit the amount you eat.  If you want a salad, choose a garden salad and ask for grilled meats. Avoid extra toppings like bacon, cheese, or fried items. Ask for the dressing on the side, or ask for olive oil and vinegar or lemon to use as dressing.  Estimate how many servings of a food you are given. For example, a serving of cooked rice is  cup or about the size of half a baseball. Knowing serving sizes will help you be aware of how much food you are eating at restaurants. The list below tells you how big or small some common portion sizes are based on everyday objects: ? 1 oz-4 stacked dice. ? 3 oz-1 deck of cards. ? 1 tsp-1 die. ? 1 Tbsp- a ping-pong ball. ? 2 Tbsp-1 ping-pong ball. ?  cup- baseball. ? 1 cup-1 baseball. Summary  Calorie counting means keeping track of how many calories you eat and drink each day. If you eat fewer calories than your body needs, you should lose weight.  A healthy amount of weight to lose per week is usually 1-2 lb (0.5-0.9 kg). This usually means reducing your daily calorie intake by 500-750 calories.  The number of calories in a food can be found on a Nutrition Facts label. If a food does not have a Nutrition Facts label, try to look up the calories online or ask your dietitian for  help.  Use your calories on foods and drinks that will fill you up, and not on foods and drinks that will leave you hungry.  Use smaller plates, glasses, and bowls to prevent overeating. This information is not intended to replace advice given to you by your health care provider. Make sure you  discuss any questions you have with your health care provider. Document Released: 11/30/2005 Document Revised: 10/30/2016 Document Reviewed: 10/30/2016 Elsevier Interactive Patient Education  2018 [employer]. Dizziness Dizziness is a common problem. It is a feeling of unsteadiness or light-headedness. You may feel like you are about to faint. Dizziness can lead to injury if you stumble or fall. Anyone can become dizzy, but dizziness is more common in older adults. This condition can be caused by a number of things, including medicines, dehydration, or illness. Follow these instructions at home: Eating and drinking  Drink enough fluid to keep your urine clear or pale yellow. This helps to keep you from becoming dehydrated. Try to drink more clear fluids, such as water.  Do not drink alcohol.  Limit your caffeine intake if told to do so by your health care provider. Check ingredients and nutrition facts to see if a food or beverage contains caffeine.  Limit your salt (sodium) intake if told to do so by your health care provider. Check ingredients and nutrition facts to see if a food or beverage contains sodium. Activity  Avoid making quick movements. ? Rise slowly from chairs and steady yourself until you feel okay. ? In the morning, first sit up on the side of the bed. When you feel okay, stand slowly while you hold onto something until you know that your balance is fine.  If you need to stand in one place for a long time, move your legs often. Tighten and relax the muscles in your legs while you are standing.  Do not drive or use heavy machinery if you feel dizzy.  Avoid bending down if you feel dizzy. Place items in your home so that they are easy for you to reach without leaning over. Lifestyle  Do not use any products that contain nicotine or tobacco, such as cigarettes and e-cigarettes. If you need help quitting, ask your health care provider.  Try to reduce your stress level by  using methods such as yoga or meditation. Talk with your health care provider if you need help to manage your stress. General instructions  Watch your dizziness for any changes.  Take over-the-counter and prescription medicines only as told by your health care provider. Talk with your health care provider if you think that your dizziness is caused by a medicine that you are taking.  Tell a friend or a family member that you are feeling dizzy. If he or she notices any changes in your behavior, have this person call your health care provider.  Keep all follow-up visits as told by your health care provider. This is important. Contact a health care provider if:  Your dizziness does not go away.  Your dizziness or light-headedness gets worse.  You feel nauseous.  You have reduced hearing.  You have new symptoms.  You are unsteady on your feet or you feel like the room is spinning. Get help right away if:  You vomit or have diarrhea and are unable to eat or drink anything.  You have problems talking, walking, swallowing, or using your arms, hands, or legs.  You feel generally weak.  You are not thinking clearly or  you have trouble forming sentences. It may take a friend or family member to notice this.  You have chest pain, abdominal pain, shortness of breath, or sweating.  Your vision changes.  You have any bleeding.  You have a severe headache.  You have neck pain or a stiff neck.  You have a fever. These symptoms may represent a serious problem that is an emergency. Do not wait to see if the symptoms will go away. Get medical help right away. Call your local emergency services (911 in the [place] Do not drive yourself to the hospital. Summary  Dizziness is a feeling of unsteadiness or light-headedness. This condition can be caused by a number of things, including medicines, dehydration, or illness.  Anyone can become dizzy, but dizziness is more common in older  adults.  Drink enough fluid to keep your urine clear or pale yellow. Do not drink alcohol.  Avoid making quick movements if you feel dizzy. Monitor your dizziness for any changes. This information is not intended to replace advice given to you by your health care provider. Make sure you discuss any questions you have with your health care provider. Document Released: 05/26/2001 Document Revised: 01/02/2017 Document Reviewed: 01/02/2017 Elsevier Interactive Patient Education  2018 [employer]     Agustina Caroli, MD [facility]

## 2018-01-26 LAB — COMPREHENSIVE METABOLIC PANEL
ALBUMIN: 4.2 g/dL (ref 3.5–5.5)
ALT: 14 IU/L (ref 0–32)
AST: 19 IU/L (ref 0–40)
Albumin/Globulin Ratio: 1.2 (ref 1.2–2.2)
Alkaline Phosphatase: 48 IU/L (ref 39–117)
BUN / CREAT RATIO: 11 (ref 9–23)
BUN: 14 mg/dL (ref 6–24)
Bilirubin Total: 0.3 mg/dL (ref 0.0–1.2)
CALCIUM: 9.6 mg/dL (ref 8.7–10.2)
CO2: 24 mmol/L (ref 20–29)
Chloride: 103 mmol/L (ref 96–106)
Creatinine, Ser: 1.26 mg/dL — ABNORMAL HIGH (ref 0.57–1.00)
GFR, EST AFRICAN AMERICAN: 56 mL/min/{1.73_m2} — AB (ref >59)
GFR, EST NON AFRICAN AMERICAN: 49 mL/min/{1.73_m2} — AB (ref >59)
GLOBULIN, TOTAL: 3.4 g/dL (ref 1.5–4.5)
Glucose: 100 mg/dL — ABNORMAL HIGH (ref 65–99)
Potassium: 4.3 mmol/L (ref 3.5–5.2)
SODIUM: 141 mmol/L (ref 134–144)
TOTAL PROTEIN: 7.6 g/dL (ref 6.0–8.5)

## 2018-01-26 LAB — HEPATITIS C ANTIBODY: Hep C Virus Ab: 0.1 s/co ratio (ref 0.0–0.9)

## 2018-01-26 LAB — HEMOGLOBIN A1C
ESTIMATED AVERAGE GLUCOSE: 120 mg/dL
Hgb A1c MFr Bld: 5.8 % — ABNORMAL HIGH (ref 4.8–5.6)

## 2018-01-26 LAB — HIV ANTIBODY (ROUTINE TESTING W REFLEX): HIV Screen 4th Generation wRfx: NONREACTIVE

## 2018-01-31 ENCOUNTER — Telehealth: Payer: Self-pay | Admitting: Emergency Medicine

## 2018-01-31 NOTE — Telephone Encounter (Signed)
Pt called regarding psychiatry referral. I advised pt we are sending referral to [facility] and provided phone number. Pt also stated that medical weight management cannot see her until the end of April. She wanted to know if there was somewhere else we could try to get her in sooner? I know [facility], but was unsure if this would help with what referral is for? Pt also requested callback today to know what it is she needs to be eating. Pt stated she is having a hard time with this. Please advise. Thanks!

## 2018-01-31 NOTE — Telephone Encounter (Signed)
Referral to [facility] is okay.  No specific diet at this time just calorie restriction.  Thanks.

## 2018-02-01 NOTE — Telephone Encounter (Signed)
Thanks

## 2018-02-01 NOTE — Telephone Encounter (Signed)
Left message for pt letting her know we have switched referral to [facility] and that she can give them a call to see when they can schedule her. If she wants to go back to medical weight management for referral, I asked that she give [place] a call so we can switch it back. I also relayed Dr. Barry Brunner previous message and asked she call back with any questions. Thanks!

## 2018-02-03 ENCOUNTER — Ambulatory Visit: Payer: Self-pay

## 2018-02-03 NOTE — Telephone Encounter (Signed)
  Reason for Disposition . [1] Caller requesting NON-URGENT health information AND [2] PCP's office is the best resource  Answer Assessment - Initial Assessment Questions 1. REASON FOR CALL or QUESTION: "What is your reason for calling today?" or "How can I best help you?" or "What question do you have that I can help answer?" Summary: Patient requests a call back...   Patient has requested for someone to call her at [phone]. Patient wants advice on what medication she can take that will not damage her kidneys. Patient has requested a call asap this morning.    Thank You!!!   Spoke with pt and gave her home care advice ofr mild sore throat and cough. Pt has been drinking tea with lemon and ginger.  Routing back to provider for advice on any med she could take that will not hurt her kidneys.  Protocols used: INFORMATION ONLY CALL-A-AH

## 2018-02-07 NOTE — Telephone Encounter (Signed)
OTC meds for cough and sore throat will not hurt her kidneys. Thanks.

## 2018-02-07 NOTE — Telephone Encounter (Signed)
Phone call to patient. Relayed message from Dr. Mitchel Honour. Patient appreciative, verbalizing understanding. Closing note.

## 2018-02-17 ENCOUNTER — Encounter: Payer: BC Managed Care – PPO | Attending: Emergency Medicine | Admitting: Registered"

## 2018-02-17 ENCOUNTER — Encounter: Payer: Self-pay | Admitting: Registered"

## 2018-02-17 DIAGNOSIS — E669 Obesity, unspecified: Secondary | ICD-10-CM | POA: Insufficient documentation

## 2018-02-17 DIAGNOSIS — Z713 Dietary counseling and surveillance: Secondary | ICD-10-CM | POA: Insufficient documentation

## 2018-02-17 DIAGNOSIS — N183 Chronic kidney disease, stage 3 unspecified: Secondary | ICD-10-CM

## 2018-02-17 DIAGNOSIS — Z6832 Body mass index (BMI) 32.0-32.9, adult: Secondary | ICD-10-CM | POA: Insufficient documentation

## 2018-02-17 NOTE — Patient Instructions (Signed)
-   Aim to increase physical activity to 4 days/week.   - Be more consistent with well-balanced breakfast.

## 2018-02-17 NOTE — Progress Notes (Signed)
  Medical Nutrition Therapy:  Appt start time: 2:00 end time:  3:30.   Assessment:  Primary concerns today: Pt states she wants to know what to eat that is beneficial for chronic kidney disease. Pt states she wants to know how much phosphorus, protein, and potassium because she has been doing some research and saw as important. Recent lab results: Creatinine 1.26, GFR 56, A1c 5.8.    Pt states she has dramatically changed her eating regimen since diagnosed with stage 3 chronic kidney disease last week. Pt states she was intermittent fasting, eating 12-8pm. Pt states she does not like to cook breakfast and does not really have time. Pt states she doesn't eat a lot of beef because it takes a long time to digest. Pt states she likes navy beans and kidney beans. Pt states she has been eating more salads and loves fruit: nectarines, peaches, and apples. Pt states she has been eating more rice lately.   Pt states she has had hypertension since age 26; family history. Pt states she has an upcoming appointment with kidney specialist in 4/2 and will find out exactly what is going on with her kidneys. Pt is very proactive and motivated to make the necessary changes that benefit her body.    Preferred Learning Style:   No preference indicated   Learning Readiness:   Ready  Change in progress   MEDICATIONS: See list   DIETARY INTAKE:  Usual eating pattern includes 2 meals and 2-3 snacks per day.  Everyday foods include chicken, [place], seafood.  Avoided foods include beef.  Food allergies: water chestnuts (low-risk, cause itching)  24-hr recall:  B ( AM): typically skips; smoothies  Snk ( AM): none  L (12:30 PM): salad Snk ( PM): sweet peppers, almonds, celery D ( PM): sweet kale salad with navy beans Snk ( PM): lightly salted nuts or popcorn with olive oil Beverages: warm lemon tea & ginger, water, herbal tea  Usual physical activity: treadmill, elliptical 30min at least  3x/week  Estimated energy needs: 1800 calories 200 g carbohydrates 135 g protein 50 g fat  Progress Towards Goal(s):  In progress.   Nutritional Diagnosis:  NB-1.1 Food and nutrition-related knowledge deficit As related to lack of prior nutrition-related information.  As evidenced by pt report of recent diagnosis and not knowing what that means for her nutritionally.    Intervention:  Nutrition education and counseling. Pt was educated on the importance of monitoring sodium intake, reading nutrition facts labels, chronic kidney disease stages, and different food options that fit her lifestyle.  Goals: - Aim to increase physical activity to 4 days/week.  - Be more consistent with well-balanced breakfast.   Teaching Method Utilized:  Visual Auditory Hands on  Handouts given during visit include:  Chronic Kidney Disease Nutrition Guidelines  Barriers to learning/adherence to lifestyle change: none identified, action stage of change  Demonstrated degree of understanding via:  Teach Back   Monitoring/Evaluation:  Dietary intake, exercise, and body weight in 1 month(s).

## 2018-03-17 ENCOUNTER — Other Ambulatory Visit: Payer: Self-pay | Admitting: Nephrology

## 2018-03-17 DIAGNOSIS — I129 Hypertensive chronic kidney disease with stage 1 through stage 4 chronic kidney disease, or unspecified chronic kidney disease: Secondary | ICD-10-CM

## 2018-03-17 DIAGNOSIS — D631 Anemia in chronic kidney disease: Secondary | ICD-10-CM

## 2018-03-17 DIAGNOSIS — N189 Chronic kidney disease, unspecified: Secondary | ICD-10-CM

## 2018-03-17 DIAGNOSIS — N183 Chronic kidney disease, stage 3 unspecified: Secondary | ICD-10-CM

## 2018-03-23 ENCOUNTER — Ambulatory Visit: Payer: BC Managed Care – PPO | Admitting: Registered"

## 2018-03-23 ENCOUNTER — Ambulatory Visit
Admission: RE | Admit: 2018-03-23 | Discharge: 2018-03-23 | Disposition: A | Discharge status: Home / Self Care | Payer: BC Managed Care – PPO | Source: Ambulatory Visit | Attending: Nephrology | Admitting: Nephrology

## 2018-03-23 DIAGNOSIS — N183 Chronic kidney disease, stage 3 unspecified: Secondary | ICD-10-CM

## 2018-03-23 DIAGNOSIS — D631 Anemia in chronic kidney disease: Secondary | ICD-10-CM

## 2018-03-23 DIAGNOSIS — I129 Hypertensive chronic kidney disease with stage 1 through stage 4 chronic kidney disease, or unspecified chronic kidney disease: Secondary | ICD-10-CM

## 2018-03-23 DIAGNOSIS — N189 Chronic kidney disease, unspecified: Secondary | ICD-10-CM

## 2018-04-06 ENCOUNTER — Ambulatory Visit: Payer: BC Managed Care – PPO | Admitting: Registered"

## 2018-05-10 ENCOUNTER — Telehealth: Payer: Self-pay | Admitting: Radiology

## 2018-05-10 NOTE — Telephone Encounter (Signed)
Patient previously called requesting that Dr. Pascal Lux review her [place] result of 03/23/2018.  Results were reviewed oer instructions of Dr. Pascal Lux.  Patient will call her urologist (ordering provider) for urology follow up.     Heidi Kriegel Riki Rusk, RN 05/10/2018 11:14 AM

## 2018-07-01 ENCOUNTER — Other Ambulatory Visit: Payer: Self-pay | Admitting: Emergency Medicine

## 2018-07-01 DIAGNOSIS — I10 Essential (primary) hypertension: Secondary | ICD-10-CM

## 2018-07-04 NOTE — Telephone Encounter (Signed)
Pt needs to make appointment Med filled x 1 month. Lisinopril-HCTZ refill Last Refill:06/20/17 # 90 Last OV: 06/30/18 PCP: Dr Mitchel Honour Pharmacy:[facility]

## 2018-07-04 NOTE — Telephone Encounter (Signed)
Patient wants Dr Mitchel Honour to know she is out of her lisinopril. She said she does not want to miss a dosage and wants to know could it be expedited ?

## 2018-07-25 ENCOUNTER — Encounter: Payer: Self-pay | Admitting: Emergency Medicine

## 2018-07-25 ENCOUNTER — Ambulatory Visit: Payer: BC Managed Care – PPO | Admitting: Emergency Medicine

## 2018-07-25 ENCOUNTER — Ambulatory Visit (INDEPENDENT_AMBULATORY_CARE_PROVIDER_SITE_OTHER): Payer: BC Managed Care – PPO

## 2018-07-25 ENCOUNTER — Other Ambulatory Visit: Payer: Self-pay

## 2018-07-25 VITALS — BP 114/80 | HR 58 | Temp 98.6°F | Resp 16 | Ht 65.0 in | Wt 193.6 lb

## 2018-07-25 DIAGNOSIS — Z85118 Personal history of other malignant neoplasm of bronchus and lung: Secondary | ICD-10-CM

## 2018-07-25 DIAGNOSIS — Z23 Encounter for immunization: Secondary | ICD-10-CM

## 2018-07-25 DIAGNOSIS — I10 Essential (primary) hypertension: Secondary | ICD-10-CM | POA: Diagnosis not present

## 2018-07-25 DIAGNOSIS — D649 Anemia, unspecified: Secondary | ICD-10-CM

## 2018-07-25 DIAGNOSIS — N183 Chronic kidney disease, stage 3 unspecified: Secondary | ICD-10-CM

## 2018-07-25 MED ORDER — POLYSACCHARIDE IRON COMPLEX 150 MG PO CAPS: 150.0000 mg | ORAL_CAPSULE | Freq: Two times a day (BID) | ORAL | 3 refills | Status: DC

## 2018-07-25 MED ORDER — LISINOPRIL-HYDROCHLOROTHIAZIDE 20-25 MG PO TABS: 1.0000 | ORAL_TABLET | Freq: Every day | ORAL | 3 refills | Status: DC

## 2018-07-25 NOTE — Patient Instructions (Addendum)
   IF you received an x-ray today, you will receive an invoice from [facility]. Please contact [facility] at [phone] with questions or concerns regarding your invoice.   IF you received labwork today, you will receive an invoice from [facility]. Please contact [facility] at [phone] with questions or concerns regarding your invoice.   Our billing staff will not be able to assist you with questions regarding bills from these companies.  You will be contacted with the lab results as soon as they are available. The fastest way to get your results is to activate your My Chart account. Instructions are located on the last page of this paperwork. If you have not heard from us regarding the results in 2 weeks, please contact this office.     Hypertension Hypertension, commonly called high blood pressure, is when the force of blood pumping through the arteries is too strong. The arteries are the blood vessels that carry blood from the heart throughout the body. Hypertension forces the heart to work harder to pump blood and may cause arteries to become narrow or stiff. Having untreated or uncontrolled hypertension can cause heart attacks, strokes, kidney disease, and other problems. A blood pressure reading consists of a higher number over a lower number. Ideally, your blood pressure should be below 120/80. The first ("top") number is called the systolic pressure. It is a measure of the pressure in your arteries as your heart beats. The second ("bottom") number is called the diastolic pressure. It is a measure of the pressure in your arteries as the heart relaxes. What are the causes? The cause of this condition is not known. What increases the risk? Some risk factors for high blood pressure are under your control. Others are not. Factors you can change  Smoking.  Having type 2 diabetes mellitus, high cholesterol, or both.  Not getting enough exercise or physical  activity.  Being overweight.  Having too much fat, sugar, calories, or salt (sodium) in your diet.  Drinking too much alcohol. Factors that are difficult or impossible to change  Having chronic kidney disease.  Having a family history of high blood pressure.  Age. Risk increases with age.  Race. You may be at higher risk if you are African-American.  Gender. Men are at higher risk than women before age 45. After age 65, women are at higher risk than men.  Having obstructive sleep apnea.  Stress. What are the signs or symptoms? Extremely high blood pressure (hypertensive crisis) may cause:  Headache.  Anxiety.  Shortness of breath.  Nosebleed.  Nausea and vomiting.  Severe chest pain.  Jerky movements you cannot control (seizures).  How is this diagnosed? This condition is diagnosed by measuring your blood pressure while you are seated, with your arm resting on a surface. The cuff of the blood pressure monitor will be placed directly against the skin of your upper arm at the level of your heart. It should be measured at least twice using the same arm. Certain conditions can cause a difference in blood pressure between your right and left arms. Certain factors can cause blood pressure readings to be lower or higher than normal (elevated) for a short period of time:  When your blood pressure is higher when you are in a health care provider's office than when you are at home, this is called white coat hypertension. Most people with this condition do not need medicines.  When your blood pressure is higher at home than when you   are in a health care provider's office, this is called masked hypertension. Most people with this condition may need medicines to control blood pressure.  If you have a high blood pressure reading during one visit or you have normal blood pressure with other risk factors:  You may be asked to return on a different day to have your blood pressure  checked again.  You may be asked to monitor your blood pressure at home for 1 week or longer.  If you are diagnosed with hypertension, you may have other blood or imaging tests to help your health care provider understand your overall risk for other conditions. How is this treated? This condition is treated by making healthy lifestyle changes, such as eating healthy foods, exercising more, and reducing your alcohol intake. Your health care provider may prescribe medicine if lifestyle changes are not enough to get your blood pressure under control, and if:  Your systolic blood pressure is above 130.  Your diastolic blood pressure is above 80.  Your personal target blood pressure may vary depending on your medical conditions, your age, and other factors. Follow these instructions at home: Eating and drinking  Eat a diet that is high in fiber and potassium, and low in sodium, added sugar, and fat. An example eating plan is called the DASH (Dietary Approaches to Stop Hypertension) diet. To eat this way: ? Eat plenty of fresh fruits and vegetables. Try to fill half of your plate at each meal with fruits and vegetables. ? Eat whole grains, such as whole wheat pasta, brown rice, or whole grain bread. Fill about one quarter of your plate with whole grains. ? Eat or drink low-fat dairy products, such as skim milk or low-fat yogurt. ? Avoid fatty cuts of meat, processed or cured meats, and poultry with skin. Fill about one quarter of your plate with lean proteins, such as fish, chicken without skin, beans, eggs, and tofu. ? Avoid premade and processed foods. These tend to be higher in sodium, added sugar, and fat.  Reduce your daily sodium intake. Most people with hypertension should eat less than 1,500 mg of sodium a day.  Limit alcohol intake to no more than 1 drink a day for nonpregnant women and 2 drinks a day for men. One drink equals 12 oz of beer, 5 oz of wine, or 1 oz of hard  liquor. Lifestyle  Work with your health care provider to maintain a healthy body weight or to lose weight. Ask what an ideal weight is for you.  Get at least 30 minutes of exercise that causes your heart to beat faster (aerobic exercise) most days of the week. Activities may include walking, swimming, or biking.  Include exercise to strengthen your muscles (resistance exercise), such as pilates or lifting weights, as part of your weekly exercise routine. Try to do these types of exercises for 30 minutes at least 3 days a week.  Do not use any products that contain nicotine or tobacco, such as cigarettes and e-cigarettes. If you need help quitting, ask your health care provider.  Monitor your blood pressure at home as told by your health care provider.  Keep all follow-up visits as told by your health care provider. This is important. Medicines  Take over-the-counter and prescription medicines only as told by your health care provider. Follow directions carefully. Blood pressure medicines must be taken as prescribed.  Do not skip doses of blood pressure medicine. Doing this puts you at risk for problems and   can make the medicine less effective.  Ask your health care provider about side effects or reactions to medicines that you should watch for. Contact a health care provider if:  You think you are having a reaction to a medicine you are taking.  You have headaches that keep coming back (recurring).  You feel dizzy.  You have swelling in your ankles.  You have trouble with your vision. Get help right away if:  You develop a severe headache or confusion.  You have unusual weakness or numbness.  You feel faint.  You have severe pain in your chest or abdomen.  You vomit repeatedly.  You have trouble breathing. Summary  Hypertension is when the force of blood pumping through your arteries is too strong. If this condition is not controlled, it may put you at risk for serious  complications.  Your personal target blood pressure may vary depending on your medical conditions, your age, and other factors. For most people, a normal blood pressure is less than 120/80.  Hypertension is treated with lifestyle changes, medicines, or a combination of both. Lifestyle changes include weight loss, eating a healthy, low-sodium diet, exercising more, and limiting alcohol. This information is not intended to replace advice given to you by your health care provider. Make sure you discuss any questions you have with your health care provider. Document Released: 11/30/2005 Document Revised: 10/28/2016 Document Reviewed: 10/28/2016 [employer] Interactive Patient Education  2018 [employer]  

## 2018-07-25 NOTE — Progress Notes (Signed)
Heidi Herrera 52 y.o.   Chief Complaint  Patient presents with  . Medication Refill    Lisinopril-HCTZ 20-25 mg, Polysaccharide Iron Complex Niferex 150 mg, pt states "mu kidney doctor wants me to take iron bid, I don't take this everyday, but if he knows something that will work better".    HISTORY OF PRESENT ILLNESS: This is a 52 y.o. female with history of hypertension here for follow-up.  Also has a history of chronic kidney disease, recently seen by nephrologist and diagnosed with polycystic kidney disease.  Scheduled for MRI next month.  Asymptomatic at this point.  HPI   Prior to Admission medications   Medication Sig Start Date End Date Taking? Authorizing Provider  Ascorbic Acid (VITAMIN C) 1000 MG tablet Take 2,000 mg by mouth 2 (two) times daily.    Yes [provider]  Biotin 1000 MCG tablet Take 1,000 mcg by mouth daily.    Yes [provider]  calcium-vitamin D (OSCAL WITH D) 500-200 MG-UNIT per tablet Take 1 tablet by mouth 2 (two) times daily. Calcium 1000 mg with Magnesium 400 mg and zinc 15 mg, taking 1 capsule 2 times daily   Yes [provider]  cetirizine (ZYRTEC) 10 MG tablet Take 1 tablet (10 mg total) by mouth daily. 02/13/16  Yes Posey Boyer, MD  co-enzyme Q-10 30 MG capsule Take 200 mg by mouth daily.   Yes [provider]  Cyanocobalamin (VITAMIN B 12 PO) Take 1 tablet by mouth daily.   Yes [provider]  iron polysaccharides (NIFEREX) 150 MG capsule Take 150 mg by mouth daily.   Yes [provider]  lisinopril-hydrochlorothiazide (PRINZIDE,ZESTORETIC) 20-25 MG tablet TAKE 1 TABLET BY MOUTH ONCE DAILY 07/04/18  Yes Coree Brame, Ines Bloomer, MD  Multiple Vitamins-Minerals (ALIVE ONCE DAILY WOMENS 50+ PO) Take 1 tablet by mouth daily.   Yes [provider]  Omega 3-6-9 Fatty Acids (OMEGA-3-6-9 PO) Take 1 capsule by mouth daily.    [provider]  psyllium (METAMUCIL) 58.6 % powder Take 1  packet by mouth daily.     [provider]  triamcinolone cream (KENALOG) 0.1 % Apply 1 application topically 2 (two) times daily. Patient not taking: Reported on 07/25/2018 05/19/17   Horald Pollen, MD    Allergies  Allergen Reactions  . Bystolic [Nebivolol Hcl] Anaphylaxis, Itching and Swelling  . Biaxin [Clarithromycin]     The category of the Biaxin family.  Also allergic to water chestnuts.  . Erythromycin Base Rash    Patient Active Problem List   Diagnosis Date Noted  . Breast cancer screening 01/25/2018  . Lightheadedness 01/25/2018  . Menorrhagia with regular cycle 01/25/2018  . Body mass index (BMI) of 32.0-32.9 in adult 01/25/2018  . Situational mixed anxiety and depressive disorder 01/25/2018  . Irritant contact dermatitis 05/19/2017  . Pain in both feet 05/19/2017  . CKD (chronic kidney disease) stage 3, GFR 30-59 ml/min (HCC) 04/15/2017  . Sore throat 03/23/2017  . Viral pharyngitis 03/23/2017  . Acute URI 03/23/2017  . Fibroid 12/01/2016  . Fibroid, uterine   . Essential hypertension 09/29/2014  . Chronic anemia 02/02/2014  . VITAMIN D DEFICIENCY 12/18/2010  . HYPERLIPIDEMIA 12/18/2010  . CHEST PAIN 12/18/2010  . NEPHROLITHIASIS 03/24/2010  . 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 GRANULOMA 10/09/2009  . ESOPHAGEAL STRICTURE 08/27/2009  . GERD 08/27/2009  . Anxiety state 04/20/2008  . HYPERTENSION 04/20/2008  . OBESITY, NOS 02/10/2007  . HYPERTENSION, BENIGN SYSTEMIC 02/10/2007  .  FIBROADENOSIS, BREAST 02/10/2007    Past Medical History:  Diagnosis Date  . Acid reflux   . Allergy   . Anemia   . Anxiety   . GERD (gastroesophageal reflux disease)   . Hypertension   . Obesity   . Sebaceous cyst     Past Surgical History:  Procedure Laterality Date  . IR GENERIC HISTORICAL  12/01/2016   IR ANGIOGRAM PELVIS SELECTIVE OR SUPRASELECTIVE 12/01/2016 Sandi Mariscal, MD [facility]  . IR GENERIC HISTORICAL  12/01/2016   IR US GUIDE VASC ACCESS  RIGHT 12/01/2016 Sandi Mariscal, MD [facility]  . IR GENERIC HISTORICAL  12/01/2016   IR EMBO ARTERIAL NOT HEMORR HEMANG INC GUIDE ROADMAPPING 12/01/2016 Sandi Mariscal, MD [facility]  . IR GENERIC HISTORICAL  12/01/2016   IR ANGIOGRAM SELECTIVE EACH ADDITIONAL VESSEL 12/01/2016 Sandi Mariscal, MD [facility]  . IR GENERIC HISTORICAL  12/01/2016   IR ANGIOGRAM SELECTIVE EACH ADDITIONAL VESSEL 12/01/2016 Sandi Mariscal, MD [facility]  . IR GENERIC HISTORICAL  12/01/2016   IR ANGIOGRAM PELVIS SELECTIVE OR SUPRASELECTIVE 12/01/2016 Sandi Mariscal, MD [facility]  . IR GENERIC HISTORICAL  01/07/2017   IR RADIOLOGIST EVAL & MGMT 01/07/2017 Sandi Mariscal, MD [facility]  . IR RADIOLOGIST EVAL & MGMT  08/31/2017  . LOBECTOMY  2009  . MYOMECTOMY  2007  . UTERINE FIBROID EMBOLIZATION     12/01/2016    Social History   Socioeconomic History  . Marital status: Divorced    Spouse name: Not on file  . Number of children: Not on file  . Years of education: Not on file  . Highest education level: Not on file  Occupational History  . Not on file  Social Needs  . Financial resource strain: Not on file  . Food insecurity:    Worry: Not on file    Inability: Not on file  . Transportation needs:    Medical: Not on file    Non-medical: Not on file  Tobacco Use  . Smoking status: Never Smoker  . Smokeless tobacco: Never Used  Substance and Sexual Activity  . Alcohol use: Yes    Alcohol/week: 1.0 standard drinks    Types: 1 Glasses of wine per week  . Drug use: No  . Sexual activity: Not on file  Lifestyle  . Physical activity:    Days per week: Not on file    Minutes per session: Not on file  . Stress: Not on file  Relationships  . Social connections:    Talks on phone: Not on file    Gets together: Not on file    Attends religious service: Not on file    Active member of club or organization: Not on file    Attends meetings of clubs or organizations: Not on file    Relationship status:  Not on file  . Intimate partner violence:    Fear of current or ex partner: Not on file    Emotionally abused: Not on file    Physically abused: Not on file    Forced sexual activity: Not on file  Other Topics Concern  . Not on file  Social History Narrative  . Not on file    Family History  Problem Relation Age of Onset  . Hypertension Mother   . Obesity Mother   . Stroke Mother   . Diabetes Mother   . Hypertension Sister   . Hyperlipidemia Sister   . Obesity Sister   . Diabetes Sister   .  Heart disease Sister   . Mental illness Sister   . Hypertension Brother   . Obesity Brother   . Stroke Brother   . Mental illness Father   . Hypertension Father   . Cancer Brother   . Stroke Brother   . Diabetes Sister   . Hypertension Sister   . Mental illness Sister      Review of Systems  Constitutional: Negative.  Negative for chills, fever and weight loss.  HENT: Negative.  Negative for congestion, hearing loss, nosebleeds and sore throat.   Eyes: Negative for blurred vision and double vision.  Respiratory: Negative.  Negative for cough and shortness of breath.   Cardiovascular: Negative.  Negative for chest pain and palpitations.  Gastrointestinal: Negative for abdominal pain, blood in stool, diarrhea, melena, nausea and vomiting.  Genitourinary: Negative.  Negative for dysuria and hematuria.  Musculoskeletal: Negative for back pain, myalgias and neck pain.  Skin: Negative.  Negative for rash.  Neurological: Negative.  Negative for dizziness and headaches.  Endo/Heme/Allergies: Negative.    Vitals:   07/25/18 1557  BP: 114/80  Pulse: (!) 58  Resp: 16  Temp: 98.6 F (37 C)  SpO2: 98%     Physical Exam  Constitutional: She is oriented to person, place, and time. She appears well-developed and well-nourished.  HENT:  Head: Normocephalic.  Nose: Nose normal.  Mouth/Throat: Oropharynx is clear and moist.  Eyes: Pupils are equal, round, and reactive to light.  Conjunctivae and EOM are normal.  Neck: Normal range of motion.  Cardiovascular: Normal rate, regular rhythm and normal heart sounds.  Pulmonary/Chest: Effort normal and breath sounds normal.  Abdominal: Soft. She exhibits no distension. There is no tenderness.  Musculoskeletal: Normal range of motion. She exhibits no edema or tenderness.  Neurological: She is alert and oriented to person, place, and time. No sensory deficit. She exhibits normal muscle tone.  Skin: Skin is warm and dry. Capillary refill takes less than 2 seconds. No rash noted.  Psychiatric: She has a normal mood and affect. Her behavior is normal.  Vitals reviewed.  A total of 25 minutes was spent in the room with the patient, greater than 50% of which was in counseling/coordination of care regarding chronic medical problems, medications, treatment, and need for follow-up.   ASSESSMENT & PLAN: Alysandra was seen today for medication refill.  Diagnoses and all orders for this visit:  Essential hypertension -     lisinopril-hydrochlorothiazide (PRINZIDE,ZESTORETIC) 20-25 MG tablet; Take 1 tablet by mouth daily.  CKD (chronic kidney disease) stage 3, GFR 30-59 ml/min (HCC)  Chronic anemia -     iron polysaccharides (NIFEREX) 150 MG capsule; Take 1 capsule (150 mg total) by mouth 2 (two) times daily.  History of lung cancer -     DG Chest 2 View; Future  Need for Tdap vaccination -     Tdap vaccine greater than or equal to 7yo IM    Patient Instructions       IF you received an x-ray today, you will receive an invoice from [facility]. Please contact [facility] at [phone] with questions or concerns regarding your invoice.   IF you received labwork today, you will receive an invoice from [place]. Please contact [facility] at [phone] with questions or concerns regarding your invoice.   Our billing staff will not be able to assist you with questions regarding bills from these  companies.  You will be contacted with the lab results as soon as they are available.  The fastest way to get your results is to activate your My Chart account. Instructions are located on the last page of this paperwork. If you have not heard from [place] regarding the results in 2 weeks, please contact this office.     Hypertension Hypertension, commonly called high blood pressure, is when the force of blood pumping through the arteries is too strong. The arteries are the blood vessels that carry blood from the heart throughout the body. Hypertension forces the heart to work harder to pump blood and may cause arteries to become narrow or stiff. Having untreated or uncontrolled hypertension can cause heart attacks, strokes, kidney disease, and other problems. A blood pressure reading consists of a higher number over a lower number. Ideally, your blood pressure should be below 120/80. The first ("top") number is called the systolic pressure. It is a measure of the pressure in your arteries as your heart beats. The second ("bottom") number is called the diastolic pressure. It is a measure of the pressure in your arteries as the heart relaxes. What are the causes? The cause of this condition is not known. What increases the risk? Some risk factors for high blood pressure are under your control. Others are not. Factors you can change  Smoking.  Having type 2 diabetes mellitus, high cholesterol, or both.  Not getting enough exercise or physical activity.  Being overweight.  Having too much fat, sugar, calories, or salt (sodium) in your diet.  Drinking too much alcohol. Factors that are difficult or impossible to change  Having chronic kidney disease.  Having a family history of high blood pressure.  Age. Risk increases with age.  Race. You may be at higher risk if you are African-American.  Gender. Men are at higher risk than women before age 62. After age 53, women are at higher risk than  men.  Having obstructive sleep apnea.  Stress. What are the signs or symptoms? Extremely high blood pressure (hypertensive crisis) may cause:  Headache.  Anxiety.  Shortness of breath.  Nosebleed.  Nausea and vomiting.  Severe chest pain.  Jerky movements you cannot control (seizures).  How is this diagnosed? This condition is diagnosed by measuring your blood pressure while you are seated, with your arm resting on a surface. The cuff of the blood pressure monitor will be placed directly against the skin of your upper arm at the level of your heart. It should be measured at least twice using the same arm. Certain conditions can cause a difference in blood pressure between your right and left arms. Certain factors can cause blood pressure readings to be lower or higher than normal (elevated) for a short period of time:  When your blood pressure is higher when you are in a health care provider's office than when you are at home, this is called white coat hypertension. Most people with this condition do not need medicines.  When your blood pressure is higher at home than when you are in a health care provider's office, this is called masked hypertension. Most people with this condition may need medicines to control blood pressure.  If you have a high blood pressure reading during one visit or you have normal blood pressure with other risk factors:  You may be asked to return on a different day to have your blood pressure checked again.  You may be asked to monitor your blood pressure at home for 1 week or longer.  If you are diagnosed with hypertension, you may  have other blood or imaging tests to help your health care provider understand your overall risk for other conditions. How is this treated? This condition is treated by making healthy lifestyle changes, such as eating healthy foods, exercising more, and reducing your alcohol intake. Your health care provider may prescribe  medicine if lifestyle changes are not enough to get your blood pressure under control, and if:  Your systolic blood pressure is above 130.  Your diastolic blood pressure is above 80.  Your personal target blood pressure may vary depending on your medical conditions, your age, and other factors. Follow these instructions at home: Eating and drinking  Eat a diet that is high in fiber and potassium, and low in sodium, added sugar, and fat. An example eating plan is called the DASH (Dietary Approaches to Stop Hypertension) diet. To eat this way: ? Eat plenty of fresh fruits and vegetables. Try to fill half of your plate at each meal with fruits and vegetables. ? Eat whole grains, such as whole wheat pasta, brown rice, or whole grain bread. Fill about one quarter of your plate with whole grains. ? Eat or drink low-fat dairy products, such as skim milk or low-fat yogurt. ? Avoid fatty cuts of meat, processed or cured meats, and poultry with skin. Fill about one quarter of your plate with lean proteins, such as fish, chicken without skin, beans, eggs, and tofu. ? Avoid premade and processed foods. These tend to be higher in sodium, added sugar, and fat.  Reduce your daily sodium intake. Most people with hypertension should eat less than 1,500 mg of sodium a day.  Limit alcohol intake to no more than 1 drink a day for nonpregnant women and 2 drinks a day for men. One drink equals 12 oz of beer, 5 oz of wine, or 1 oz of hard liquor. Lifestyle  Work with your health care provider to maintain a healthy body weight or to lose weight. Ask what an ideal weight is for you.  Get at least 30 minutes of exercise that causes your heart to beat faster (aerobic exercise) most days of the week. Activities may include walking, swimming, or biking.  Include exercise to strengthen your muscles (resistance exercise), such as pilates or lifting weights, as part of your weekly exercise routine. Try to do these types  of exercises for 30 minutes at least 3 days a week.  Do not use any products that contain nicotine or tobacco, such as cigarettes and e-cigarettes. If you need help quitting, ask your health care provider.  Monitor your blood pressure at home as told by your health care provider.  Keep all follow-up visits as told by your health care provider. This is important. Medicines  Take over-the-counter and prescription medicines only as told by your health care provider. Follow directions carefully. Blood pressure medicines must be taken as prescribed.  Do not skip doses of blood pressure medicine. Doing this puts you at risk for problems and can make the medicine less effective.  Ask your health care provider about side effects or reactions to medicines that you should watch for. Contact a health care provider if:  You think you are having a reaction to a medicine you are taking.  You have headaches that keep coming back (recurring).  You feel dizzy.  You have swelling in your ankles.  You have trouble with your vision. Get help right away if:  You develop a severe headache or confusion.  You have unusual weakness or  numbness.  You feel faint.  You have severe pain in your chest or abdomen.  You vomit repeatedly.  You have trouble breathing. Summary  Hypertension is when the force of blood pumping through your arteries is too strong. If this condition is not controlled, it may put you at risk for serious complications.  Your personal target blood pressure may vary depending on your medical conditions, your age, and other factors. For most people, a normal blood pressure is less than 120/80.  Hypertension is treated with lifestyle changes, medicines, or a combination of both. Lifestyle changes include weight loss, eating a healthy, low-sodium diet, exercising more, and limiting alcohol. This information is not intended to replace advice given to you by your health care provider.  Make sure you discuss any questions you have with your health care provider. Document Released: 11/30/2005 Document Revised: 10/28/2016 Document Reviewed: 10/28/2016 Elsevier Interactive Patient Education  2018 [employer]     Agustina Caroli, MD [facility]

## 2018-07-27 ENCOUNTER — Other Ambulatory Visit: Payer: Self-pay | Admitting: Nephrology

## 2018-07-27 DIAGNOSIS — Q612 Polycystic kidney, adult type: Secondary | ICD-10-CM

## 2018-07-29 ENCOUNTER — Ambulatory Visit: Payer: BC Managed Care – PPO | Admitting: Physician Assistant

## 2018-08-10 ENCOUNTER — Other Ambulatory Visit: Payer: BC Managed Care – PPO

## 2018-08-13 ENCOUNTER — Other Ambulatory Visit: Payer: BC Managed Care – PPO

## 2018-09-07 ENCOUNTER — Other Ambulatory Visit: Payer: Self-pay

## 2018-09-07 ENCOUNTER — Encounter: Payer: Self-pay | Admitting: Emergency Medicine

## 2018-09-07 ENCOUNTER — Ambulatory Visit: Payer: BC Managed Care – PPO | Admitting: Emergency Medicine

## 2018-09-07 VITALS — BP 110/71 | HR 66 | Temp 97.9°F | Resp 16 | Ht 64.25 in | Wt 187.8 lb

## 2018-09-07 DIAGNOSIS — Q613 Polycystic kidney, unspecified: Secondary | ICD-10-CM

## 2018-09-07 DIAGNOSIS — N183 Chronic kidney disease, stage 3 unspecified: Secondary | ICD-10-CM

## 2018-09-07 DIAGNOSIS — R51 Headache: Secondary | ICD-10-CM | POA: Diagnosis not present

## 2018-09-07 DIAGNOSIS — I10 Essential (primary) hypertension: Secondary | ICD-10-CM

## 2018-09-07 DIAGNOSIS — R519 Headache, unspecified: Secondary | ICD-10-CM

## 2018-09-07 MED ORDER — BUTALBITAL-APAP-CAFFEINE 50-325-40 MG PO TABS: 1.0000 | ORAL_TABLET | Freq: Four times a day (QID) | ORAL | 0 refills | Status: AC | PRN

## 2018-09-07 NOTE — Progress Notes (Signed)
Wt Readings from Last 3 Encounters:  09/07/18 187 lb 12.8 oz (85.2 kg)  07/25/18 193 lb 9.6 oz (87.8 kg)  02/17/18 197 lb (89.4 kg)   Heidi Herrera 52 y.o.   Chief Complaint  Patient presents with  . Headache    X 6 DAYS on the left side every 15-20 seconds SHARP pain    HISTORY OF PRESENT ILLNESS: This is a 52 y.o. female complaining of left-sided throbbing daily headache for the past 6 days.  Fleeting headaches lasting 15 to 20 seconds and recurrent for the whole day.  Difficulty sleeping.  No other associated symptoms.  HPI   Prior to Admission medications   Medication Sig Start Date End Date Taking? Authorizing Provider  Biotin 1000 MCG tablet Take 1,000 mcg by mouth daily.    Yes [provider]  calcium-vitamin D (OSCAL WITH D) 500-200 MG-UNIT per tablet Take 1 tablet by mouth 2 (two) times daily. Calcium 1000 mg with Magnesium 400 mg and zinc 15 mg, taking 1 capsule 2 times daily   Yes [provider]  cetirizine (ZYRTEC) 10 MG tablet Take 1 tablet (10 mg total) by mouth daily. 02/13/16  Yes Posey Boyer, MD  co-enzyme Q-10 30 MG capsule Take 200 mg by mouth daily.   Yes [provider]  Cyanocobalamin (VITAMIN B 12 PO) Take 1 tablet by mouth daily.   Yes [provider]  lisinopril-hydrochlorothiazide (PRINZIDE,ZESTORETIC) 20-25 MG tablet Take 1 tablet by mouth daily. 07/25/18 10/23/18 Yes Khoa Opdahl, Ines Bloomer, MD  Multiple Vitamins-Minerals (ALIVE ONCE DAILY WOMENS 50+ PO) Take 1 tablet by mouth daily.   Yes [provider]  Omega 3-6-9 Fatty Acids (OMEGA-3-6-9 PO) Take 1 capsule by mouth daily.   Yes [provider]  Ascorbic Acid (VITAMIN C) 1000 MG tablet Take 2,000 mg by mouth 2 (two) times daily.     [provider]  iron polysaccharides (NIFEREX) 150 MG capsule Take 1 capsule (150 mg total) by mouth 2 (two) times daily. 07/25/18 08/24/18  Horald Pollen, MD  psyllium (METAMUCIL) 58.6 % powder Take 1  packet by mouth daily.     [provider]  triamcinolone cream (KENALOG) 0.1 % Apply 1 application topically 2 (two) times daily. Patient not taking: Reported on 07/25/2018 05/19/17   Horald Pollen, MD    Allergies  Allergen Reactions  . Bystolic [Nebivolol Hcl] Anaphylaxis, Itching and Swelling  . Biaxin [Clarithromycin]     The category of the Biaxin family.  Also allergic to water chestnuts.  . Erythromycin Base Rash    Patient Active Problem List   Diagnosis Date Noted  . Breast cancer screening 01/25/2018  . Lightheadedness 01/25/2018  . Menorrhagia with regular cycle 01/25/2018  . Body mass index (BMI) of 32.0-32.9 in adult 01/25/2018  . Situational mixed anxiety and depressive disorder 01/25/2018  . Irritant contact dermatitis 05/19/2017  . Pain in both feet 05/19/2017  . CKD (chronic kidney disease) stage 3, GFR 30-59 ml/min (HCC) 04/15/2017  . Sore throat 03/23/2017  . Viral pharyngitis 03/23/2017  . Acute URI 03/23/2017  . Fibroid 12/01/2016  . Fibroid, uterine   . Essential hypertension 09/29/2014  . Chronic anemia 02/02/2014  . VITAMIN D DEFICIENCY 12/18/2010  . HYPERLIPIDEMIA 12/18/2010  . CHEST PAIN 12/18/2010  . NEPHROLITHIASIS 03/24/2010  . 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 GRANULOMA 10/09/2009  . ESOPHAGEAL STRICTURE 08/27/2009  . GERD 08/27/2009  . Anxiety state 04/20/2008  . HYPERTENSION 04/20/2008  . OBESITY, NOS  02/10/2007  . HYPERTENSION, BENIGN SYSTEMIC 02/10/2007  . FIBROADENOSIS, BREAST 02/10/2007    Past Medical History:  Diagnosis Date  . Acid reflux   . Allergy   . Anemia   . Anxiety   . GERD (gastroesophageal reflux disease)   . Hypertension   . Obesity   . Sebaceous cyst     Past Surgical History:  Procedure Laterality Date  . IR GENERIC HISTORICAL  12/01/2016   IR ANGIOGRAM PELVIS SELECTIVE OR SUPRASELECTIVE 12/01/2016 Sandi Mariscal, MD [facility]  . IR GENERIC HISTORICAL  12/01/2016   IR US GUIDE VASC ACCESS  RIGHT 12/01/2016 Sandi Mariscal, MD [facility]  . IR GENERIC HISTORICAL  12/01/2016   IR EMBO ARTERIAL NOT HEMORR HEMANG INC GUIDE ROADMAPPING 12/01/2016 Sandi Mariscal, MD [facility]  . IR GENERIC HISTORICAL  12/01/2016   IR ANGIOGRAM SELECTIVE EACH ADDITIONAL VESSEL 12/01/2016 Sandi Mariscal, MD [facility]  . IR GENERIC HISTORICAL  12/01/2016   IR ANGIOGRAM SELECTIVE EACH ADDITIONAL VESSEL 12/01/2016 Sandi Mariscal, MD [facility]  . IR GENERIC HISTORICAL  12/01/2016   IR ANGIOGRAM PELVIS SELECTIVE OR SUPRASELECTIVE 12/01/2016 Sandi Mariscal, MD [facility]  . IR GENERIC HISTORICAL  01/07/2017   IR RADIOLOGIST EVAL & MGMT 01/07/2017 Sandi Mariscal, MD [facility]  . IR RADIOLOGIST EVAL & MGMT  08/31/2017  . LOBECTOMY  2009  . MYOMECTOMY  2007  . UTERINE FIBROID EMBOLIZATION     12/01/2016    Social History   Socioeconomic History  . Marital status: Divorced    Spouse name: Not on file  . Number of children: Not on file  . Years of education: Not on file  . Highest education level: Not on file  Occupational History  . Not on file  Social Needs  . Financial resource strain: Not on file  . Food insecurity:    Worry: Not on file    Inability: Not on file  . Transportation needs:    Medical: Not on file    Non-medical: Not on file  Tobacco Use  . Smoking status: Never Smoker  . Smokeless tobacco: Never Used  Substance and Sexual Activity  . Alcohol use: Yes    Alcohol/week: 1.0 standard drinks    Types: 1 Glasses of wine per week  . Drug use: No  . Sexual activity: Not on file  Lifestyle  . Physical activity:    Days per week: Not on file    Minutes per session: Not on file  . Stress: Not on file  Relationships  . Social connections:    Talks on phone: Not on file    Gets together: Not on file    Attends religious service: Not on file    Active member of club or organization: Not on file    Attends meetings of clubs or organizations: Not on file    Relationship status:  Not on file  . Intimate partner violence:    Fear of current or ex partner: Not on file    Emotionally abused: Not on file    Physically abused: Not on file    Forced sexual activity: Not on file  Other Topics Concern  . Not on file  Social History Narrative  . Not on file    Family History  Problem Relation Age of Onset  . Hypertension Mother   . Obesity Mother   . Stroke Mother   . Diabetes Mother   . Hypertension Sister   . Hyperlipidemia Sister   .  Obesity Sister   . Diabetes Sister   . Heart disease Sister   . Mental illness Sister   . Hypertension Brother   . Obesity Brother   . Stroke Brother   . Mental illness Father   . Hypertension Father   . Cancer Brother   . Stroke Brother   . Diabetes Sister   . Hypertension Sister   . Mental illness Sister      Review of Systems  Constitutional: Negative.  Negative for chills and fever.  HENT: Negative.  Negative for congestion, ear pain, hearing loss, nosebleeds, sinus pain and sore throat.   Eyes: Negative.  Negative for blurred vision, double vision, photophobia, pain, discharge and redness.  Respiratory: Negative.  Negative for cough and shortness of breath.   Cardiovascular: Negative.  Negative for chest pain and palpitations.  Gastrointestinal: Negative.  Negative for abdominal pain, diarrhea, nausea and vomiting.  Genitourinary: Negative.  Negative for dysuria and hematuria.  Musculoskeletal: Negative.  Negative for neck pain.  Skin: Negative.  Negative for rash.  Neurological: Positive for headaches. Negative for dizziness, sensory change, speech change, focal weakness and loss of consciousness.  Endo/Heme/Allergies: Negative.   All other systems reviewed and are negative.   Vitals:   09/07/18 1010  BP: 110/71  Pulse: 66  Resp: 16  Temp: 97.9 F (36.6 C)  SpO2: 100%    Physical Exam  Constitutional: She is oriented to person, place, and time. She appears well-developed and well-nourished.  HENT:    Head: Normocephalic and atraumatic.  Right Ear: Tympanic membrane, external ear and ear canal normal.  Left Ear: Tympanic membrane, external ear and ear canal normal.  Nose: Nose normal.  Mouth/Throat: Oropharynx is clear and moist.  Eyes: Pupils are equal, round, and reactive to light. Conjunctivae and EOM are normal.  Neck: Normal range of motion. Neck supple. No JVD present. No thyromegaly present.  Cardiovascular: Normal rate, regular rhythm and normal heart sounds.  Pulmonary/Chest: Effort normal and breath sounds normal.  Musculoskeletal: Normal range of motion. She exhibits no edema.  Lymphadenopathy:    She has no cervical adenopathy.  Neurological: She is alert and oriented to person, place, and time. No cranial nerve deficit or sensory deficit. She exhibits normal muscle tone. Coordination normal.  Skin: Skin is warm and dry. Capillary refill takes less than 2 seconds. No rash noted.  Psychiatric: She has a normal mood and affect. Her behavior is normal.  Vitals reviewed.   A total of 25 minutes was spent in the room with the patient, greater than 50% of which was in counseling/coordination of care regarding differential diagnosis, management, medication, and need for follow-up.  ASSESSMENT & PLAN: Graziella was seen today for headache.  Diagnoses and all orders for this visit:  Intractable headache, unspecified chronicity pattern, unspecified headache type -     MR Brain Wo Contrast; Future -     Ambulatory referral to Neurology  Polycystic kidney disease  Essential hypertension  CKD (chronic kidney disease) stage 3, GFR 30-59 ml/min (HCC)  Other orders -     butalbital-acetaminophen-caffeine (FIORICET, ESGIC) 50-325-40 MG tablet; Take 1-2 tablets by mouth every 6 (six) hours as needed for headache.    Patient Instructions       If you have lab work done today you will be contacted with your lab results within the next 2 weeks.  If you have not heard from [place]  then please contact us. The fastest way to get your results is to  register for My Chart.   IF you received an x-ray today, you will receive an invoice from [facility]. Please contact [facility] at [phone] with questions or concerns regarding your invoice.   IF you received labwork today, you will receive an invoice from [facility]. Please contact [facility] at [phone] with questions or concerns regarding your invoice.   Our billing staff will not be able to assist you with questions regarding bills from these companies.  You will be contacted with the lab results as soon as they are available. The fastest way to get your results is to activate your My Chart account. Instructions are located on the last page of this paperwork. If you have not heard from [place] regarding the results in 2 weeks, please contact this office.     Migraine Headache A migraine headache is a very strong throbbing pain on one side or both sides of your head. Migraines can also cause other symptoms. Talk with your doctor about what things may bring on (trigger) your migraine headaches. Follow these instructions at home: Medicines  Take over-the-counter and prescription medicines only as told by your doctor.  Do not drive or use heavy machinery while taking prescription pain medicine.  To prevent or treat constipation while you are taking prescription pain medicine, your doctor may recommend that you: ? Drink enough fluid to keep your pee (urine) clear or pale yellow. ? Take over-the-counter or prescription medicines. ? Eat foods that are high in fiber. These include fresh fruits and vegetables, whole grains, and beans. ? Limit foods that are high in fat and processed sugars. These include fried and sweet foods. Lifestyle  Avoid alcohol.  Do not use any products that contain nicotine or tobacco, such as cigarettes and e-cigarettes. If you need help quitting, ask your doctor.  Get at least 8  hours of sleep every night.  Limit your stress. General instructions   Keep a journal to find out what may bring on your migraines. For example, write down: ? What you eat and drink. ? How much sleep you get. ? Any change in what you eat or drink. ? Any change in your medicines.  If you have a migraine: ? Avoid things that make your symptoms worse, such as bright lights. ? It may help to lie down in a dark, quiet room. ? Do not drive or use heavy machinery. ? Ask your doctor what activities are safe for you.  Keep all follow-up visits as told by your doctor. This is important. Contact a doctor if:  You get a migraine that is different or worse than your usual migraines. Get help right away if:  Your migraine gets very bad.  You have a fever.  You have a stiff neck.  You have trouble seeing.  Your muscles feel weak or like you cannot control them.  You start to lose your balance a lot.  You start to have trouble walking.  You pass out (faint). This information is not intended to replace advice given to you by your health care provider. Make sure you discuss any questions you have with your health care provider. Document Released: 09/08/2008 Document Revised: 06/19/2016 Document Reviewed: 05/18/2016 Elsevier Interactive Patient Education  2018 [employer] Inc.      Agustina Caroli, MD [facility]

## 2018-09-07 NOTE — Patient Instructions (Addendum)
If you have lab work done today you will be contacted with your lab results within the next 2 weeks.  If you have not heard from us then please contact us. The fastest way to get your results is to register for My Chart.   IF you received an x-ray today, you will receive an invoice from [facility]. Please contact [facility] at [phone] with questions or concerns regarding your invoice.   IF you received labwork today, you will receive an invoice from [facility]. Please contact [facility] at [phone] with questions or concerns regarding your invoice.   Our billing staff will not be able to assist you with questions regarding bills from these companies.  You will be contacted with the lab results as soon as they are available. The fastest way to get your results is to activate your My Chart account. Instructions are located on the last page of this paperwork. If you have not heard from us regarding the results in 2 weeks, please contact this office.     Migraine Headache A migraine headache is a very strong throbbing pain on one side or both sides of your head. Migraines can also cause other symptoms. Talk with your doctor about what things may bring on (trigger) your migraine headaches. Follow these instructions at home: Medicines  Take over-the-counter and prescription medicines only as told by your doctor.  Do not drive or use heavy machinery while taking prescription pain medicine.  To prevent or treat constipation while you are taking prescription pain medicine, your doctor may recommend that you: ? Drink enough fluid to keep your pee (urine) clear or pale yellow. ? Take over-the-counter or prescription medicines. ? Eat foods that are high in fiber. These include fresh fruits and vegetables, whole grains, and beans. ? Limit foods that are high in fat and processed sugars. These include fried and sweet foods. Lifestyle  Avoid alcohol.  Do not use any  products that contain nicotine or tobacco, such as cigarettes and e-cigarettes. If you need help quitting, ask your doctor.  Get at least 8 hours of sleep every night.  Limit your stress. General instructions   Keep a journal to find out what may bring on your migraines. For example, write down: ? What you eat and drink. ? How much sleep you get. ? Any change in what you eat or drink. ? Any change in your medicines.  If you have a migraine: ? Avoid things that make your symptoms worse, such as bright lights. ? It may help to lie down in a dark, quiet room. ? Do not drive or use heavy machinery. ? Ask your doctor what activities are safe for you.  Keep all follow-up visits as told by your doctor. This is important. Contact a doctor if:  You get a migraine that is different or worse than your usual migraines. Get help right away if:  Your migraine gets very bad.  You have a fever.  You have a stiff neck.  You have trouble seeing.  Your muscles feel weak or like you cannot control them.  You start to lose your balance a lot.  You start to have trouble walking.  You pass out (faint). This information is not intended to replace advice given to you by your health care provider. Make sure you discuss any questions you have with your health care provider. Document Released: 09/08/2008 Document Revised: 06/19/2016 Document Reviewed: 05/18/2016 Elsevier Interactive Patient Education  2018 [employer]  

## 2018-09-26 DIAGNOSIS — H8113 Benign paroxysmal vertigo, bilateral: Secondary | ICD-10-CM | POA: Insufficient documentation

## 2018-10-10 ENCOUNTER — Other Ambulatory Visit: Payer: Self-pay | Admitting: Obstetrics and Gynecology

## 2018-10-26 ENCOUNTER — Ambulatory Visit: Payer: BC Managed Care – PPO | Admitting: Emergency Medicine

## 2018-11-22 ENCOUNTER — Ambulatory Visit (INDEPENDENT_AMBULATORY_CARE_PROVIDER_SITE_OTHER): Payer: BC Managed Care – PPO | Admitting: Diagnostic Neuroimaging

## 2018-11-22 ENCOUNTER — Encounter: Payer: Self-pay | Admitting: Diagnostic Neuroimaging

## 2018-11-22 VITALS — BP 119/73 | HR 73 | Ht 65.5 in | Wt 196.0 lb

## 2018-11-22 DIAGNOSIS — G43809 Other migraine, not intractable, without status migrainosus: Secondary | ICD-10-CM | POA: Diagnosis not present

## 2018-11-22 NOTE — Progress Notes (Signed)
[facility]  PATIENT: Heidi CATANESE DOB: 06/01/1966  REFERRING CLINICIAN: Jerilynn Mages Sagardia HISTORY FROM: patient  REASON FOR VISIT: new consult    HISTORICAL  CHIEF COMPLAINT:  Chief Complaint  Patient presents with  . Headache    rm 7, New Pt, "migraines for as long as I can remember but not often until lately, now more frequent and intense; recently dx with vertigo"    HISTORY OF PRESENT ILLNESS:   52 year old female here for evaluation of headaches.  In her 72s patient had headaches onset, left and right side, feeling like an ax in her head.  Headaches for last 3 to 4 days, occurring every few years.  Sometimes she would have difficulty getting her words out.  No nausea or vomiting.  No photophobia or phonophobia.  Cold temperatures seem to aggravate headaches.  In the last 2 years she has had increasing headaches.  No vision changes.  Headaches are now occurring every other month.  Patient has tried ibuprofen and Fioricet without significant relief.  Patient is family history of migraine in her sister.    REVIEW OF SYSTEMS: Full 14 system review of systems performed and negative with exception of: Passing out snoring headache sleepiness anemia not no sleep.  ALLERGIES: Allergies  Allergen Reactions  . Bystolic [Nebivolol Hcl] Anaphylaxis, Itching and Swelling  . Biaxin [Clarithromycin]     The category of the Biaxin family.  Also allergic to water chestnuts.  . Erythromycin Base Rash    HOME MEDICATIONS: Outpatient Medications Prior to Visit  Medication Sig Dispense Refill  . Biotin 1000 MCG tablet Take 1,000 mcg by mouth daily.     . butalbital-acetaminophen-caffeine (FIORICET, ESGIC) 50-325-40 MG tablet Take 1-2 tablets by mouth every 6 (six) hours as needed for headache. 20 tablet 0  . calcium-vitamin D (OSCAL WITH D) 500-200 MG-UNIT per tablet Take 1 tablet by mouth 2 (two) times daily. Calcium 1000 mg with Magnesium 400 mg and zinc 15 mg,  taking 1 capsule 2 times daily    . cetirizine (ZYRTEC) 10 MG tablet Take 1 tablet (10 mg total) by mouth daily. 90 tablet 3  . Cyanocobalamin (VITAMIN B 12 PO) Take 1 tablet by mouth daily.    . IFEREX 150 150 MG capsule Take 150 mg by mouth 2 (two) times daily.  3  . lisinopril-hydrochlorothiazide (PRINZIDE,ZESTORETIC) 20-25 MG tablet Take 1 tablet by mouth daily. 90 tablet 3  . Multiple Vitamins-Minerals (ALIVE ONCE DAILY WOMENS 50+ PO) Take 1 tablet by mouth daily.    . Ascorbic Acid (VITAMIN C) 1000 MG tablet Take 2,000 mg by mouth 2 (two) times daily.     Marland Kitchen co-enzyme Q-10 30 MG capsule Take 200 mg by mouth daily.    . iron polysaccharides (NIFEREX) 150 MG capsule Take 1 capsule (150 mg total) by mouth 2 (two) times daily. (Patient not taking: Reported on 11/22/2018) 60 capsule 3  . Omega 3-6-9 Fatty Acids (OMEGA-3-6-9 PO) Take 1 capsule by mouth daily.    . psyllium (METAMUCIL) 58.6 % powder Take 1 packet by mouth daily.     Marland Kitchen triamcinolone cream (KENALOG) 0.1 % Apply 1 application topically 2 (two) times daily. (Patient not taking: Reported on 07/25/2018) 30 g 0   No facility-administered medications prior to visit.     PAST MEDICAL HISTORY: Past Medical History:  Diagnosis Date  . Acid reflux   . Allergy   . Anemia   . Anxiety   . Fibroids   .  GERD (gastroesophageal reflux disease)   . Hypertension   . Kidney cysts   . Migraine   . Obesity   . Sebaceous cyst   . Vertigo     PAST SURGICAL HISTORY: Past Surgical History:  Procedure Laterality Date  . IR GENERIC HISTORICAL  12/01/2016   IR ANGIOGRAM PELVIS SELECTIVE OR SUPRASELECTIVE 12/01/2016 Sandi Mariscal, MD [facility]  . IR GENERIC HISTORICAL  12/01/2016   IR US GUIDE VASC ACCESS RIGHT 12/01/2016 Sandi Mariscal, MD [facility]  . IR GENERIC HISTORICAL  12/01/2016   IR EMBO ARTERIAL NOT HEMORR HEMANG INC GUIDE ROADMAPPING 12/01/2016 Sandi Mariscal, MD [facility]  . IR GENERIC HISTORICAL  12/01/2016   IR ANGIOGRAM  SELECTIVE EACH ADDITIONAL VESSEL 12/01/2016 Sandi Mariscal, MD [facility]  . IR GENERIC HISTORICAL  12/01/2016   IR ANGIOGRAM SELECTIVE EACH ADDITIONAL VESSEL 12/01/2016 Sandi Mariscal, MD [facility]  . IR GENERIC HISTORICAL  12/01/2016   IR ANGIOGRAM PELVIS SELECTIVE OR SUPRASELECTIVE 12/01/2016 Sandi Mariscal, MD [facility]  . IR GENERIC HISTORICAL  01/07/2017   IR RADIOLOGIST EVAL & MGMT 01/07/2017 Sandi Mariscal, MD [facility]  . IR RADIOLOGIST EVAL & MGMT  08/31/2017  . LOBECTOMY  2009  . MYOMECTOMY  2007  . UTERINE FIBROID EMBOLIZATION     12/01/2016    FAMILY HISTORY: Family History  Problem Relation Age of Onset  . Hypertension Mother   . Obesity Mother   . Stroke Mother   . Diabetes Mother   . Hypertension Sister   . Hyperlipidemia Sister   . Obesity Sister   . Diabetes Sister   . Heart disease Sister   . Mental illness Sister   . Glaucoma Sister   . Hypertension Brother   . Obesity Brother   . Stroke Brother   . Glaucoma Brother   . Mental illness Father   . Hypertension Father   . Cancer Brother   . Stroke Brother   . Diabetes Sister   . Hypertension Sister   . Mental illness Sister   . Glaucoma Maternal Uncle   . Cancer Sister        colon    SOCIAL HISTORY: Social History   Socioeconomic History  . Marital status: Divorced    Spouse name: Not on file  . Number of children: 1  . Years of education: Not on file  . Highest education level: Master's degree (e.g., MA, MS, MEng, MEd, MSW, MBA)  Occupational History    Comment: [employer]  Social Needs  . Financial resource strain: Not on file  . Food insecurity:    Worry: Not on file    Inability: Not on file  . Transportation needs:    Medical: Not on file    Non-medical: Not on file  Tobacco Use  . Smoking status: Never Smoker  . Smokeless tobacco: Never Used  Substance and Sexual Activity  . Alcohol use: Not Currently    Alcohol/week: 1.0 standard drinks    Types: 1 Glasses of wine per week     Comment: quit years ago  . Drug use: No  . Sexual activity: Not on file  Lifestyle  . Physical activity:    Days per week: Not on file    Minutes per session: Not on file  . Stress: Not on file  Relationships  . Social connections:    Talks on phone: Not on file    Gets together: Not on file    Attends religious service: Not  on file    Active member of club or organization: Not on file    Attends meetings of clubs or organizations: Not on file    Relationship status: Not on file  . Intimate partner violence:    Fear of current or ex partner: Not on file    Emotionally abused: Not on file    Physically abused: Not on file    Forced sexual activity: Not on file  Other Topics Concern  . Not on file  Social History Narrative   Lives alone   No caffiene     PHYSICAL EXAM  GENERAL EXAM/CONSTITUTIONAL: Vitals:  Vitals:   11/22/18 1311  BP: 119/73  Pulse: 73  Weight: 196 lb (88.9 kg)  Height: 5' 5.5" (1.664 m)     Body mass index is 32.12 kg/m. Wt Readings from Last 3 Encounters:  11/22/18 196 lb (88.9 kg)  09/07/18 187 lb 12.8 oz (85.2 kg)  07/25/18 193 lb 9.6 oz (87.8 kg)     Patient is in no distress; well developed, nourished and groomed; neck is supple  CARDIOVASCULAR:  Examination of carotid arteries is normal; no carotid bruits  Regular rate and rhythm, no murmurs  Examination of peripheral vascular system by observation and palpation is normal  EYES:  Ophthalmoscopic exam of optic discs and posterior segments is normal; no papilledema or hemorrhages  Visual Acuity Screening   Right eye Left eye Both eyes  Without correction:     With correction: 20/20 20/20   Comments: contacts    MUSCULOSKELETAL:  Gait, strength, tone, movements noted in Neurologic exam below  NEUROLOGIC: MENTAL STATUS:  No flowsheet data found.  awake, alert, oriented to person, place and time  recent and remote memory intact  normal attention and  concentration  language fluent, comprehension intact, naming intact  fund of knowledge appropriate  CRANIAL NERVE:   2nd - no papilledema on fundoscopic exam  2nd, 3rd, 4th, 6th - pupils equal and reactive to light, visual fields full to confrontation, extraocular muscles intact, no nystagmus  5th - facial sensation symmetric  7th - facial strength symmetric  8th - hearing intact  9th - palate elevates symmetrically, uvula midline  11th - shoulder shrug symmetric  12th - tongue protrusion midline  MOTOR:   normal bulk and tone, full strength in the BUE, BLE  SENSORY:   normal and symmetric to light touch, temperature, vibration  COORDINATION:   finger-nose-finger, fine finger movements normal  REFLEXES:   deep tendon reflexes present and symmetric  GAIT/STATION:   narrow based gait     DIAGNOSTIC DATA (LABS, IMAGING, TESTING) - I reviewed patient records, labs, notes, testing and imaging myself where available.  Lab Results  Component Value Date   WBC 3.8 (A) 01/25/2018   HGB 11.9 (A) 01/25/2018   HCT 35.7 (A) 01/25/2018   MCV 85.6 01/25/2018   PLT 354 04/15/2017      Component Value Date/Time   NA 141 01/25/2018 0932   K 4.3 01/25/2018 0932   CL 103 01/25/2018 0932   CO2 24 01/25/2018 0932   GLUCOSE 100 (H) 01/25/2018 0932   GLUCOSE 135 (H) 12/02/2016 0411   BUN 14 01/25/2018 0932   CREATININE 1.26 (H) 01/25/2018 0932   CREATININE 1.17 (H) 04/04/2016 1048   CALCIUM 9.6 01/25/2018 0932   PROT 7.6 01/25/2018 0932   ALBUMIN 4.2 01/25/2018 0932   AST 19 01/25/2018 0932   ALT 14 01/25/2018 0932   ALKPHOS 48 01/25/2018 0932  BILITOT 0.3 01/25/2018 0932   GFRNONAA 49 (L) 01/25/2018 0932   GFRNONAA 49 (L) 02/13/2016 1942   GFRAA 56 (L) 01/25/2018 0932   GFRAA 56 (L) 02/13/2016 1942   Lab Results  Component Value Date   CHOL 156 11/20/2016   HDL 44 11/20/2016   LDLCALC 101 (H) 11/20/2016   LDLDIRECT 148.8 11/26/2008   TRIG 55 11/20/2016    CHOLHDL 3.5 11/20/2016   Lab Results  Component Value Date   HGBA1C 5.8 (H) 01/25/2018   No results found for: [id number] Lab Results  Component Value Date   TSH 2.070 11/20/2016      ASSESSMENT AND PLAN  52 y.o. year old female here with headache since age 68 years old with some migraine features, versus tension type headache.   Dx:  1. Migraine variant     PLAN:  HEADACHES (? Likely migraine without aura) - consider MRI brain to rule out secondary causes (ordered by PCP; patient wants to do in 2021) - consider topiramate, propranolol, gabapentin for migraine prevention - consider triptan for migraine rescue (after MRI brain completed)  Return in about 6 months (around 05/24/2019).    Penni Bombard, MD 09/81/1914, 7:82 PM Certified in Neurology, Neurophysiology and Neuroimaging  [facility] [address] [place], [place] [address] [phone]

## 2018-11-29 DIAGNOSIS — Q613 Polycystic kidney, unspecified: Secondary | ICD-10-CM | POA: Insufficient documentation

## 2018-12-21 ENCOUNTER — Other Ambulatory Visit: Payer: Self-pay | Admitting: *Deleted

## 2018-12-21 DIAGNOSIS — J302 Other seasonal allergic rhinitis: Secondary | ICD-10-CM

## 2018-12-21 MED ORDER — CETIRIZINE HCL 10 MG PO TABS: 10.0000 mg | ORAL_TABLET | Freq: Every day | ORAL | 3 refills | Status: DC

## 2018-12-27 ENCOUNTER — Other Ambulatory Visit: Payer: Self-pay | Admitting: Emergency Medicine

## 2018-12-27 ENCOUNTER — Telehealth: Payer: Self-pay | Admitting: Emergency Medicine

## 2018-12-27 DIAGNOSIS — Z6832 Body mass index (BMI) 32.0-32.9, adult: Secondary | ICD-10-CM

## 2018-12-27 NOTE — Telephone Encounter (Signed)
Referral sent. Thanks.

## 2018-12-27 NOTE — Telephone Encounter (Signed)
Pt informed of referral placed.

## 2018-12-27 NOTE — Telephone Encounter (Signed)
Please adv     Copied from [place] [phone]. Topic: Referral - Request for Referral >> Dec 26, 2018  2:50 PM Carolyn Stare wrote:  Pt call to say her referral to [facility] has expired and she is asking for a new REFERRAL

## 2018-12-27 NOTE — Telephone Encounter (Signed)
Please advise 

## 2019-01-04 ENCOUNTER — Other Ambulatory Visit: Payer: Self-pay

## 2019-01-04 ENCOUNTER — Encounter: Payer: Self-pay | Admitting: Emergency Medicine

## 2019-01-04 ENCOUNTER — Telehealth: Payer: Self-pay | Admitting: *Deleted

## 2019-01-04 ENCOUNTER — Ambulatory Visit: Payer: BC Managed Care – PPO | Admitting: Emergency Medicine

## 2019-01-04 VITALS — BP 103/67 | HR 64 | Temp 98.6°F | Resp 16 | Ht 64.5 in | Wt 192.8 lb

## 2019-01-04 DIAGNOSIS — I10 Essential (primary) hypertension: Secondary | ICD-10-CM

## 2019-01-04 DIAGNOSIS — R519 Headache, unspecified: Secondary | ICD-10-CM

## 2019-01-04 DIAGNOSIS — R51 Headache: Secondary | ICD-10-CM | POA: Diagnosis not present

## 2019-01-04 DIAGNOSIS — N183 Chronic kidney disease, stage 3 unspecified: Secondary | ICD-10-CM

## 2019-01-04 NOTE — Telephone Encounter (Signed)
Left message in voice mail the Letter of Medical Necessity form is complete and ready for pick up at the front desk.

## 2019-01-04 NOTE — Progress Notes (Signed)
BP Readings from Last 3 Encounters:  11/22/18 119/73  09/07/18 110/71  07/25/18 114/80   Wt Readings from Last 3 Encounters:  11/22/18 196 lb (88.9 kg)  09/07/18 187 lb 12.8 oz (85.2 kg)  07/25/18 193 lb 9.6 oz (87.8 kg)   Heidi Herrera 52 y.o.   Chief Complaint  Patient presents with  . Headache    follow up and Neurology appt was 11/22/2018    HISTORY OF PRESENT ILLNESS: This is a 52 y.o. female here for follow-up of multiple medical problems.  Doing well. Migraine headaches much better.  Hypertension improved.  Renal function improved.  Has been losing weight. No complaints or medical concerns today. Recently saw nephrologist and states her creatinine is down to 1.05. Recently saw neurologist with diagnosis of migraines. Scheduled to see the GI doctor for upper and lower endoscopies.  HPI   Prior to Admission medications   Medication Sig Start Date End Date Taking? Authorizing Provider  Ascorbic Acid (VITAMIN C) 1000 MG tablet Take 2,000 mg by mouth 2 (two) times daily.    Yes [provider]  Biotin 1000 MCG tablet Take 1,000 mcg by mouth daily.    Yes [provider]  butalbital-acetaminophen-caffeine (FIORICET, ESGIC) 50-325-40 MG tablet Take 1-2 tablets by mouth every 6 (six) hours as needed for headache. 09/07/18 09/07/19 Yes Sagardia, Miguel Jose, MD  calcium-vitamin D (OSCAL WITH D) 500-200 MG-UNIT per tablet Take 1 tablet by mouth 2 (two) times daily. Calcium 1000 mg with Magnesium 400 mg and zinc 15 mg, taking 1 capsule 2 times daily   Yes [provider]  cetirizine (ZYRTEC) 10 MG tablet Take 1 tablet (10 mg total) by mouth daily. 12/21/18  Yes Sagardia, Miguel Jose, MD  Cyanocobalamin (VITAMIN B 12 PO) Take 1 tablet by mouth daily.   Yes [provider]  IFEREX 150 150 MG capsule Take 150 mg by mouth 2 (two) times daily. 09/24/18  Yes [provider]  Multiple Vitamins-Minerals (ALIVE ONCE DAILY WOMENS 50+ PO) Take 1  tablet by mouth daily.   Yes [provider]  co-enzyme Q-10 30 MG capsule Take 200 mg by mouth daily.    [provider]  iron polysaccharides (NIFEREX) 150 MG capsule Take 1 capsule (150 mg total) by mouth 2 (two) times daily. Patient not taking: Reported on 11/22/2018 07/25/18 08/24/18  Sagardia, Miguel Jose, MD  lisinopril-hydrochlorothiazide (PRINZIDE,ZESTORETIC) 20-25 MG tablet Take 1 tablet by mouth daily. 07/25/18 11/22/18  Sagardia, Miguel Jose, MD  Omega 3-6-9 Fatty Acids (OMEGA-3-6-9 PO) Take 1 capsule by mouth daily.    [provider]  psyllium (METAMUCIL) 58.6 % powder Take 1 packet by mouth daily.     [provider]  triamcinolone cream (KENALOG) 0.1 % Apply 1 application topically 2 (two) times daily. Patient not taking: Reported on 01/04/2019 05/19/17   Sagardia, Miguel Jose, MD    Allergies  Allergen Reactions  . Bystolic [Nebivolol Hcl] Anaphylaxis, Itching and Swelling  . Biaxin [Clarithromycin]     The category of the Biaxin family.  Also allergic to water chestnuts.  . Erythromycin Base Rash    Patient Active Problem List   Diagnosis Date Noted  . Breast cancer screening 01/25/2018  . Lightheadedness 01/25/2018  . Menorrhagia with regular cycle 01/25/2018  . Body mass index (BMI) of 32.0-32.9 in adult 01/25/2018  . Situational mixed anxiety and depressive disorder 01/25/2018  . CKD (chronic kidney disease) stage 3, GFR 30-59 ml/min (HCC) 04/15/2017  . Sore   throat 03/23/2017  . Fibroid 12/01/2016  . Fibroid, uterine   . Essential hypertension 09/29/2014  . Chronic anemia 02/02/2014  . VITAMIN D DEFICIENCY 12/18/2010  . HYPERLIPIDEMIA 12/18/2010  . CHEST PAIN 12/18/2010  . NEPHROLITHIASIS 03/24/2010  . 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 GRANULOMA 10/09/2009  . ESOPHAGEAL STRICTURE 08/27/2009  . GERD 08/27/2009  . Anxiety state 04/20/2008  . HYPERTENSION 04/20/2008  . OBESITY, NOS 02/10/2007  . HYPERTENSION, BENIGN SYSTEMIC  02/10/2007  . FIBROADENOSIS, BREAST 02/10/2007    Past Medical History:  Diagnosis Date  . Acid reflux   . Allergy   . Anemia   . Anxiety   . Fibroids   . GERD (gastroesophageal reflux disease)   . Hypertension   . Kidney cysts   . Migraine   . Obesity   . Sebaceous cyst   . Vertigo     Past Surgical History:  Procedure Laterality Date  . IR GENERIC HISTORICAL  12/01/2016   IR ANGIOGRAM PELVIS SELECTIVE OR SUPRASELECTIVE 12/01/2016 John Watts, MD [facility]  . IR GENERIC HISTORICAL  12/01/2016   IR US GUIDE VASC ACCESS RIGHT 12/01/2016 John Watts, MD [facility]  . IR GENERIC HISTORICAL  12/01/2016   IR EMBO ARTERIAL NOT HEMORR HEMANG INC GUIDE ROADMAPPING 12/01/2016 John Watts, MD [facility]  . IR GENERIC HISTORICAL  12/01/2016   IR ANGIOGRAM SELECTIVE EACH ADDITIONAL VESSEL 12/01/2016 John Watts, MD [facility]  . IR GENERIC HISTORICAL  12/01/2016   IR ANGIOGRAM SELECTIVE EACH ADDITIONAL VESSEL 12/01/2016 John Watts, MD [facility]  . IR GENERIC HISTORICAL  12/01/2016   IR ANGIOGRAM PELVIS SELECTIVE OR SUPRASELECTIVE 12/01/2016 John Watts, MD [facility]  . IR GENERIC HISTORICAL  01/07/2017   IR RADIOLOGIST EVAL & MGMT 01/07/2017 John Watts, MD [facility]  . IR RADIOLOGIST EVAL & MGMT  08/31/2017  . LOBECTOMY  2009  . MYOMECTOMY  2007  . UTERINE FIBROID EMBOLIZATION     12/01/2016    Social History   Socioeconomic History  . Marital status: Divorced    Spouse name: Not on file  . Number of children: 1  . Years of education: Not on file  . Highest education level: Master's degree (e.g., MA, MS, MEng, MEd, MSW, MBA)  Occupational History    Comment: [employer]  Social Needs  . Financial resource strain: Not on file  . Food insecurity:    Worry: Not on file    Inability: Not on file  . Transportation needs:    Medical: Not on file    Non-medical: Not on file  Tobacco Use  . Smoking status: Never Smoker  . Smokeless tobacco: Never Used   Substance and Sexual Activity  . Alcohol use: Not Currently    Alcohol/week: 1.0 standard drinks    Types: 1 Glasses of wine per week    Comment: quit years ago  . Drug use: No  . Sexual activity: Not on file  Lifestyle  . Physical activity:    Days per week: Not on file    Minutes per session: Not on file  . Stress: Not on file  Relationships  . Social connections:    Talks on phone: Not on file    Gets together: Not on file    Attends religious service: Not on file    Active member of club or organization: Not on file    Attends meetings of clubs or organizations: Not on file    Relationship status: Not on file  .   Intimate partner violence:    Fear of current or ex partner: Not on file    Emotionally abused: Not on file    Physically abused: Not on file    Forced sexual activity: Not on file  Other Topics Concern  . Not on file  Social History Narrative   Lives alone   No caffiene    Family History  Problem Relation Age of Onset  . Hypertension Mother   . Obesity Mother   . Stroke Mother   . Diabetes Mother   . Hypertension Sister   . Hyperlipidemia Sister   . Obesity Sister   . Diabetes Sister   . Heart disease Sister   . Mental illness Sister   . Glaucoma Sister   . Hypertension Brother   . Obesity Brother   . Stroke Brother   . Glaucoma Brother   . Mental illness Father   . Hypertension Father   . Cancer Brother   . Stroke Brother   . Diabetes Sister   . Hypertension Sister   . Mental illness Sister   . Glaucoma Maternal Uncle   . Cancer Sister        colon     Review of Systems  Constitutional: Negative.  Negative for chills, fever and malaise/fatigue.  HENT: Negative.  Negative for sore throat.   Eyes: Negative.  Negative for blurred vision and double vision.  Respiratory: Negative.  Negative for cough and shortness of breath.   Cardiovascular: Negative.  Negative for chest pain and palpitations.  Gastrointestinal: Negative.  Negative for  abdominal pain, nausea and vomiting.  Genitourinary: Negative.   Musculoskeletal: Negative.   Skin: Negative.  Negative for rash.  Neurological: Negative for dizziness and headaches.  Endo/Heme/Allergies: Negative.   All other systems reviewed and are negative.   Vitals:   01/04/19 1341  BP: 103/67  Pulse: 64  Resp: 16  Temp: 98.6 F (37 C)  SpO2: 100%    Physical Exam Vitals signs reviewed.  Constitutional:      Appearance: She is well-developed.  HENT:     Head: Normocephalic and atraumatic.     Mouth/Throat:     Mouth: Mucous membranes are moist.     Pharynx: Oropharynx is clear.  Eyes:     Extraocular Movements: Extraocular movements intact.  Neck:     Musculoskeletal: Normal range of motion and neck supple.  Cardiovascular:     Rate and Rhythm: Normal rate and regular rhythm.     Heart sounds: Normal heart sounds.  Pulmonary:     Effort: Pulmonary effort is normal.     Breath sounds: Normal breath sounds.  Abdominal:     Palpations: Abdomen is soft.     Tenderness: There is no abdominal tenderness.  Musculoskeletal: Normal range of motion.  Skin:    General: Skin is warm and dry.     Capillary Refill: Capillary refill takes less than 2 seconds.  Neurological:     General: No focal deficit present.     Mental Status: She is alert and oriented to person, place, and time.     Sensory: No sensory deficit.     Motor: No weakness.     Coordination: Coordination normal.  Psychiatric:        Mood and Affect: Mood normal.        Behavior: Behavior normal.    A total of 25 minutes was spent in the room with the patient, greater than 50% of which was in counseling/coordination  of care regarding chronic medical problems, diet and nutrition, medications, need for follow-up.   ASSESSMENT & PLAN: Heidi Herrera was seen today for headache.  Diagnoses and all orders for this visit:  Essential hypertension  Intractable headache, unspecified chronicity pattern,  unspecified headache type  CKD (chronic kidney disease) stage 3, GFR 30-59 ml/min (HCC)   Patient Instructions       If you have lab work done today you will be contacted with your lab results within the next 2 weeks.  If you have not heard from [place] then please contact us. The fastest way to get your results is to register for My Chart.   IF you received an x-ray today, you will receive an invoice from [facility]. Please contact [facility] at [phone] with questions or concerns regarding your invoice.   IF you received labwork today, you will receive an invoice from [facility]. Please contact [facility] at [phone] with questions or concerns regarding your invoice.   Our billing staff will not be able to assist you with questions regarding bills from these companies.  You will be contacted with the lab results as soon as they are available. The fastest way to get your results is to activate your My Chart account. Instructions are located on the last page of this paperwork. If you have not heard from [place] regarding the results in 2 weeks, please contact this office.    Health Maintenance, Female Adopting a healthy lifestyle and getting preventive care can go a long way to promote health and wellness. Talk with your health care provider about what schedule of regular examinations is right for you. This is a good chance for you to check in with your provider about disease prevention and staying healthy. In between checkups, there are plenty of things you can do on your own. Experts have done a lot of research about which lifestyle changes and preventive measures are most likely to keep you healthy. Ask your health care provider for more information. Weight and diet Eat a healthy diet  Be sure to include plenty of vegetables, fruits, low-fat dairy products, and lean protein.  Do not eat a lot of foods high in solid fats, added sugars, or salt.  Get regular exercise.  This is one of the most important things you can do for your health. ? Most adults should exercise for at least 150 minutes each week. The exercise should increase your heart rate and make you sweat (moderate-intensity exercise). ? Most adults should also do strengthening exercises at least twice a week. This is in addition to the moderate-intensity exercise. Maintain a healthy weight  Body mass index (BMI) is a measurement that can be used to identify possible weight problems. It estimates body fat based on height and weight. Your health care provider can help determine your BMI and help you achieve or maintain a healthy weight.  For females 18 years of age and older: ? A BMI below 18.5 is considered underweight. ? A BMI of 18.5 to 24.9 is normal. ? A BMI of 25 to 29.9 is considered overweight. ? A BMI of 30 and above is considered obese. Watch levels of cholesterol and blood lipids  You should start having your blood tested for lipids and cholesterol at 55 years of age, then have this test every 5 years.  You may need to have your cholesterol levels checked more often if: ? Your lipid or cholesterol levels are high. ? You are older than 55 years of  age. ? You are at high risk for heart disease. Cancer screening Lung Cancer  Lung cancer screening is recommended for adults 55-80 years old who are at high risk for lung cancer because of a history of smoking.  A yearly low-dose CT scan of the lungs is recommended for people who: ? Currently smoke. ? Have quit within the past 15 years. ? Have at least a 30-pack-year history of smoking. A pack year is smoking an average of one pack of cigarettes a day for 1 year.  Yearly screening should continue until it has been 15 years since you quit.  Yearly screening should stop if you develop a health problem that would prevent you from having lung cancer treatment. Breast Cancer  Practice breast self-awareness. This means understanding how your  breasts normally appear and feel.  It also means doing regular breast self-exams. Let your health care provider know about any changes, no matter how small.  If you are in your 20s or 30s, you should have a clinical breast exam (CBE) by a health care provider every 1-3 years as part of a regular health exam.  If you are 40 or older, have a CBE every year. Also consider having a breast X-ray (mammogram) every year.  If you have a family history of breast cancer, talk to your health care provider about genetic screening.  If you are at high risk for breast cancer, talk to your health care provider about having an MRI and a mammogram every year.  Breast cancer gene (BRCA) assessment is recommended for women who have family members with BRCA-related cancers. BRCA-related cancers include: ? Breast. ? Ovarian. ? Tubal. ? Peritoneal cancers.  Results of the assessment will determine the need for genetic counseling and BRCA1 and BRCA2 testing. Cervical Cancer Your health care provider may recommend that you be screened regularly for cancer of the pelvic organs (ovaries, uterus, and vagina). This screening involves a pelvic examination, including checking for microscopic changes to the surface of your cervix (Pap test). You may be encouraged to have this screening done every 3 years, beginning at age 21.  For women ages 30-65, health care providers may recommend pelvic exams and Pap testing every 3 years, or they may recommend the Pap and pelvic exam, combined with testing for human papilloma virus (HPV), every 5 years. Some types of HPV increase your risk of cervical cancer. Testing for HPV may also be done on women of any age with unclear Pap test results.  Other health care providers may not recommend any screening for nonpregnant women who are considered low risk for pelvic cancer and who do not have symptoms. Ask your health care provider if a screening pelvic exam is right for you.  If you  have had past treatment for cervical cancer or a condition that could lead to cancer, you need Pap tests and screening for cancer for at least 20 years after your treatment. If Pap tests have been discontinued, your risk factors (such as having a new sexual partner) need to be reassessed to determine if screening should resume. Some women have medical problems that increase the chance of getting cervical cancer. In these cases, your health care provider may recommend more frequent screening and Pap tests. Colorectal Cancer  This type of cancer can be detected and often prevented.  Routine colorectal cancer screening usually begins at 55 years of age and continues through 55 years of age.  Your health care provider may recommend screening at an   earlier age if you have risk factors for colon cancer.  Your health care provider may also recommend using home test kits to check for hidden blood in the stool.  A small camera at the end of a tube can be used to examine your colon directly (sigmoidoscopy or colonoscopy). This is done to check for the earliest forms of colorectal cancer.  Routine screening usually begins at age 50.  Direct examination of the colon should be repeated every 5-10 years through 55 years of age. However, you may need to be screened more often if early forms of precancerous polyps or small growths are found. Skin Cancer  Check your skin from head to toe regularly.  Tell your health care provider about any new moles or changes in moles, especially if there is a change in a mole's shape or color.  Also tell your health care provider if you have a mole that is larger than the size of a pencil eraser.  Always use sunscreen. Apply sunscreen liberally and repeatedly throughout the day.  Protect yourself by wearing long sleeves, pants, a wide-brimmed hat, and sunglasses whenever you are outside. Heart disease, diabetes, and high blood pressure  High blood pressure causes heart  disease and increases the risk of stroke. High blood pressure is more likely to develop in: ? People who have blood pressure in the high end of the normal range (130-139/85-89 mm Hg). ? People who are overweight or obese. ? People who are African American.  If you are 18-39 years of age, have your blood pressure checked every 3-5 years. If you are 40 years of age or older, have your blood pressure checked every year. You should have your blood pressure measured twice-once when you are at a hospital or clinic, and once when you are not at a hospital or clinic. Record the average of the two measurements. To check your blood pressure when you are not at a hospital or clinic, you can use: ? An automated blood pressure machine at a pharmacy. ? A home blood pressure monitor.  If you are between 55 years and 79 years old, ask your health care provider if you should take aspirin to prevent strokes.  Have regular diabetes screenings. This involves taking a blood sample to check your fasting blood sugar level. ? If you are at a normal weight and have a low risk for diabetes, have this test once every three years after 55 years of age. ? If you are overweight and have a high risk for diabetes, consider being tested at a younger age or more often. Preventing infection Hepatitis B  If you have a higher risk for hepatitis B, you should be screened for this virus. You are considered at high risk for hepatitis B if: ? You were born in a country where hepatitis B is common. Ask your health care provider which countries are considered high risk. ? Your parents were born in a high-risk country, and you have not been immunized against hepatitis B (hepatitis B vaccine). ? You have HIV or AIDS. ? You use needles to inject street drugs. ? You live with someone who has hepatitis B. ? You have had sex with someone who has hepatitis B. ? You get hemodialysis treatment. ? You take certain medicines for conditions,  including cancer, organ transplantation, and autoimmune conditions. Hepatitis C  Blood testing is recommended for: ? Everyone born from 1945 through 1965. ? Anyone with known risk factors for hepatitis C. Sexually transmitted   infections (STIs)  You should be screened for sexually transmitted infections (STIs) including gonorrhea and chlamydia if: ? You are sexually active and are younger than 55 years of age. ? You are older than 55 years of age and your health care provider tells you that you are at risk for this type of infection. ? Your sexual activity has changed since you were last screened and you are at an increased risk for chlamydia or gonorrhea. Ask your health care provider if you are at risk.  If you do not have HIV, but are at risk, it may be recommended that you take a prescription medicine daily to prevent HIV infection. This is called pre-exposure prophylaxis (PrEP). You are considered at risk if: ? You are sexually active and do not regularly use condoms or know the HIV status of your partner(s). ? You take drugs by injection. ? You are sexually active with a partner who has HIV. Talk with your health care provider about whether you are at high risk of being infected with HIV. If you choose to begin PrEP, you should first be tested for HIV. You should then be tested every 3 months for as long as you are taking PrEP. Pregnancy  If you are premenopausal and you may become pregnant, ask your health care provider about preconception counseling.  If you may become pregnant, take 400 to 800 micrograms (mcg) of folic acid every day.  If you want to prevent pregnancy, talk to your health care provider about birth control (contraception). Osteoporosis and menopause  Osteoporosis is a disease in which the bones lose minerals and strength with aging. This can result in serious bone fractures. Your risk for osteoporosis can be identified using a bone density scan.  If you are 68  years of age or older, or if you are at risk for osteoporosis and fractures, ask your health care provider if you should be screened.  Ask your health care provider whether you should take a calcium or vitamin D supplement to lower your risk for osteoporosis.  Menopause may have certain physical symptoms and risks.  Hormone replacement therapy may reduce some of these symptoms and risks. Talk to your health care provider about whether hormone replacement therapy is right for you. Follow these instructions at home:  Schedule regular health, dental, and eye exams.  Stay current with your immunizations.  Do not use any tobacco products including cigarettes, chewing tobacco, or electronic cigarettes.  If you are pregnant, do not drink alcohol.  If you are breastfeeding, limit how much and how often you drink alcohol.  Limit alcohol intake to no more than 1 drink per day for nonpregnant women. One drink equals 12 ounces of beer, 5 ounces of wine, or 1 ounces of hard liquor.  Do not use street drugs.  Do not share needles.  Ask your health care provider for help if you need support or information about quitting drugs.  Tell your health care provider if you often feel depressed.  Tell your health care provider if you have ever been abused or do not feel safe at home. This information is not intended to replace advice given to you by your health care provider. Make sure you discuss any questions you have with your health care provider. Document Released: 06/15/2011 Document Revised: 05/07/2016 Document Reviewed: 09/03/2015 Elsevier Interactive Patient Education  2019 [employer]      Agustina Caroli, MD [facility]

## 2019-01-04 NOTE — Patient Instructions (Addendum)
If you have lab work done today you will be contacted with your lab results within the next 2 weeks.  If you have not heard from [place] then please contact us. The fastest way to get your results is to register for My Chart.   IF you received an x-ray today, you will receive an invoice from [facility]. Please contact [facility] at [phone] with questions or concerns regarding your invoice.   IF you received labwork today, you will receive an invoice from [place]. Please contact [facility] at [phone] with questions or concerns regarding your invoice.   Our billing staff will not be able to assist you with questions regarding bills from these companies.  You will be contacted with the lab results as soon as they are available. The fastest way to get your results is to activate your My Chart account. Instructions are located on the last page of this paperwork. If you have not heard from [place] regarding the results in 2 weeks, please contact this office.    Health Maintenance, Female Adopting a healthy lifestyle and getting preventive care can go a long way to promote health and wellness. Talk with your health care provider about what schedule of regular examinations is right for you. This is a good chance for you to check in with your provider about disease prevention and staying healthy. In between checkups, there are plenty of things you can do on your own. Experts have done a lot of research about which lifestyle changes and preventive measures are most likely to keep you healthy. Ask your health care provider for more information. Weight and diet Eat a healthy diet  Be sure to include plenty of vegetables, fruits, low-fat dairy products, and lean protein.  Do not eat a lot of foods high in solid fats, added sugars, or salt.  Get regular exercise. This is one of the most important things you can do for your health. ? Most adults should exercise for at least 150  minutes each week. The exercise should increase your heart rate and make you sweat (moderate-intensity exercise). ? Most adults should also do strengthening exercises at least twice a week. This is in addition to the moderate-intensity exercise. Maintain a healthy weight  Body mass index (BMI) is a measurement that can be used to identify possible weight problems. It estimates body fat based on height and weight. Your health care provider can help determine your BMI and help you achieve or maintain a healthy weight.  For females 12 years of age and older: ? A BMI below 18.5 is considered underweight. ? A BMI of 18.5 to 24.9 is normal. ? A BMI of 25 to 29.9 is considered overweight. ? A BMI of 30 and above is considered obese. Watch levels of cholesterol and blood lipids  You should start having your blood tested for lipids and cholesterol at 55 years of age, then have this test every 5 years.  You may need to have your cholesterol levels checked more often if: ? Your lipid or cholesterol levels are high. ? You are older than 55 years of age. ? You are at high risk for heart disease. Cancer screening Lung Cancer  Lung cancer screening is recommended for adults 58-36 years old who are at high risk for lung cancer because of a history of smoking.  A yearly low-dose CT scan of the lungs is recommended for people who: ? Currently smoke. ? Have quit within the past 15  years. ? Have at least a 30-pack-year history of smoking. A pack year is smoking an average of one pack of cigarettes a day for 1 year.  Yearly screening should continue until it has been 15 years since you quit.  Yearly screening should stop if you develop a health problem that would prevent you from having lung cancer treatment. Breast Cancer  Practice breast self-awareness. This means understanding how your breasts normally appear and feel.  It also means doing regular breast self-exams. Let your health care provider  know about any changes, no matter how small.  If you are in your 20s or 30s, you should have a clinical breast exam (CBE) by a health care provider every 1-3 years as part of a regular health exam.  If you are 3 or older, have a CBE every year. Also consider having a breast X-ray (mammogram) every year.  If you have a family history of breast cancer, talk to your health care provider about genetic screening.  If you are at high risk for breast cancer, talk to your health care provider about having an MRI and a mammogram every year.  Breast cancer gene (BRCA) assessment is recommended for women who have family members with BRCA-related cancers. BRCA-related cancers include: ? Breast. ? Ovarian. ? Tubal. ? Peritoneal cancers.  Results of the assessment will determine the need for genetic counseling and BRCA1 and BRCA2 testing. Cervical Cancer Your health care provider may recommend that you be screened regularly for cancer of the pelvic organs (ovaries, uterus, and vagina). This screening involves a pelvic examination, including checking for microscopic changes to the surface of your cervix (Pap test). You may be encouraged to have this screening done every 3 years, beginning at age 63.  For women ages 71-65, health care providers may recommend pelvic exams and Pap testing every 3 years, or they may recommend the Pap and pelvic exam, combined with testing for human papilloma virus (HPV), every 5 years. Some types of HPV increase your risk of cervical cancer. Testing for HPV may also be done on women of any age with unclear Pap test results.  Other health care providers may not recommend any screening for nonpregnant women who are considered low risk for pelvic cancer and who do not have symptoms. Ask your health care provider if a screening pelvic exam is right for you.  If you have had past treatment for cervical cancer or a condition that could lead to cancer, you need Pap tests and  screening for cancer for at least 20 years after your treatment. If Pap tests have been discontinued, your risk factors (such as having a new sexual partner) need to be reassessed to determine if screening should resume. Some women have medical problems that increase the chance of getting cervical cancer. In these cases, your health care provider may recommend more frequent screening and Pap tests. Colorectal Cancer  This type of cancer can be detected and often prevented.  Routine colorectal cancer screening usually begins at 55 years of age and continues through 55 years of age.  Your health care provider may recommend screening at an earlier age if you have risk factors for colon cancer.  Your health care provider may also recommend using home test kits to check for hidden blood in the stool.  A small camera at the end of a tube can be used to examine your colon directly (sigmoidoscopy or colonoscopy). This is done to check for the earliest forms of colorectal cancer.  Routine screening usually begins at age 50.  Direct examination of the colon should be repeated every 5-10 years through 55 years of age. However, you may need to be screened more often if early forms of precancerous polyps or small growths are found. Skin Cancer  Check your skin from head to toe regularly.  Tell your health care provider about any new moles or changes in moles, especially if there is a change in a mole's shape or color.  Also tell your health care provider if you have a mole that is larger than the size of a pencil eraser.  Always use sunscreen. Apply sunscreen liberally and repeatedly throughout the day.  Protect yourself by wearing long sleeves, pants, a wide-brimmed hat, and sunglasses whenever you are outside. Heart disease, diabetes, and high blood pressure  High blood pressure causes heart disease and increases the risk of stroke. High blood pressure is more likely to develop in: ? People who  have blood pressure in the high end of the normal range (130-139/85-89 mm Hg). ? People who are overweight or obese. ? People who are African American.  If you are 18-39 years of age, have your blood pressure checked every 3-5 years. If you are 40 years of age or older, have your blood pressure checked every year. You should have your blood pressure measured twice-once when you are at a hospital or clinic, and once when you are not at a hospital or clinic. Record the average of the two measurements. To check your blood pressure when you are not at a hospital or clinic, you can use: ? An automated blood pressure machine at a pharmacy. ? A home blood pressure monitor.  If you are between 55 years and 79 years old, ask your health care provider if you should take aspirin to prevent strokes.  Have regular diabetes screenings. This involves taking a blood sample to check your fasting blood sugar level. ? If you are at a normal weight and have a low risk for diabetes, have this test once every three years after 55 years of age. ? If you are overweight and have a high risk for diabetes, consider being tested at a younger age or more often. Preventing infection Hepatitis B  If you have a higher risk for hepatitis B, you should be screened for this virus. You are considered at high risk for hepatitis B if: ? You were born in a country where hepatitis B is common. Ask your health care provider which countries are considered high risk. ? Your parents were born in a high-risk country, and you have not been immunized against hepatitis B (hepatitis B vaccine). ? You have HIV or AIDS. ? You use needles to inject street drugs. ? You live with someone who has hepatitis B. ? You have had sex with someone who has hepatitis B. ? You get hemodialysis treatment. ? You take certain medicines for conditions, including cancer, organ transplantation, and autoimmune conditions. Hepatitis C  Blood testing is  recommended for: ? Everyone born from 1945 through 1965. ? Anyone with known risk factors for hepatitis C. Sexually transmitted infections (STIs)  You should be screened for sexually transmitted infections (STIs) including gonorrhea and chlamydia if: ? You are sexually active and are younger than 55 years of age. ? You are older than 55 years of age and your health care provider tells you that you are at risk for this type of infection. ? Your sexual activity has changed since you   were last screened and you are at an increased risk for chlamydia or gonorrhea. Ask your health care provider if you are at risk.  If you do not have HIV, but are at risk, it may be recommended that you take a prescription medicine daily to prevent HIV infection. This is called pre-exposure prophylaxis (PrEP). You are considered at risk if: ? You are sexually active and do not regularly use condoms or know the HIV status of your partner(s). ? You take drugs by injection. ? You are sexually active with a partner who has HIV. Talk with your health care provider about whether you are at high risk of being infected with HIV. If you choose to begin PrEP, you should first be tested for HIV. You should then be tested every 3 months for as long as you are taking PrEP. Pregnancy  If you are premenopausal and you may become pregnant, ask your health care provider about preconception counseling.  If you may become pregnant, take 400 to 800 micrograms (mcg) of folic acid every day.  If you want to prevent pregnancy, talk to your health care provider about birth control (contraception). Osteoporosis and menopause  Osteoporosis is a disease in which the bones lose minerals and strength with aging. This can result in serious bone fractures. Your risk for osteoporosis can be identified using a bone density scan.  If you are 65 years of age or older, or if you are at risk for osteoporosis and fractures, ask your health care  provider if you should be screened.  Ask your health care provider whether you should take a calcium or vitamin D supplement to lower your risk for osteoporosis.  Menopause may have certain physical symptoms and risks.  Hormone replacement therapy may reduce some of these symptoms and risks. Talk to your health care provider about whether hormone replacement therapy is right for you. Follow these instructions at home:  Schedule regular health, dental, and eye exams.  Stay current with your immunizations.  Do not use any tobacco products including cigarettes, chewing tobacco, or electronic cigarettes.  If you are pregnant, do not drink alcohol.  If you are breastfeeding, limit how much and how often you drink alcohol.  Limit alcohol intake to no more than 1 drink per day for nonpregnant women. One drink equals 12 ounces of beer, 5 ounces of wine, or 1 ounces of hard liquor.  Do not use street drugs.  Do not share needles.  Ask your health care provider for help if you need support or information about quitting drugs.  Tell your health care provider if you often feel depressed.  Tell your health care provider if you have ever been abused or do not feel safe at home. This information is not intended to replace advice given to you by your health care provider. Make sure you discuss any questions you have with your health care provider. Document Released: 06/15/2011 Document Revised: 05/07/2016 Document Reviewed: 09/03/2015 Elsevier Interactive Patient Education  2019 [employer]  

## 2019-01-06 ENCOUNTER — Ambulatory Visit: Payer: BC Managed Care – PPO | Admitting: Emergency Medicine

## 2019-01-25 ENCOUNTER — Ambulatory Visit: Payer: BC Managed Care – PPO | Admitting: Emergency Medicine

## 2019-01-31 ENCOUNTER — Ambulatory Visit
Admission: RE | Admit: 2019-01-31 | Discharge: 2019-01-31 | Disposition: A | Discharge status: Home / Self Care | Payer: BC Managed Care – PPO | Source: Ambulatory Visit | Attending: Emergency Medicine | Admitting: Emergency Medicine

## 2019-01-31 DIAGNOSIS — R51 Headache: Principal | ICD-10-CM

## 2019-01-31 DIAGNOSIS — R519 Headache, unspecified: Secondary | ICD-10-CM

## 2019-02-01 ENCOUNTER — Encounter: Payer: Self-pay | Admitting: Radiology

## 2019-02-23 ENCOUNTER — Other Ambulatory Visit: Payer: Self-pay | Admitting: Emergency Medicine

## 2019-02-23 NOTE — Telephone Encounter (Signed)
Requested medication (s) are due for refill today: not specified  Requested medication (s) are on the active medication list: yes    Last refill: 11/22/2018  Future visit scheduled yes 07/05/2019  Notes to clinic:historical provider  Requested Prescriptions  Pending Prescriptions Disp Refills   IFEREX 150 150 MG capsule [Pharmacy Med Name: IFerex 150 150 MG Oral Capsule] 60 capsule 0    Sig: Take 1 capsule by mouth twice daily     Off-Protocol Failed - 02/23/2019  4:28 PM      Failed - Medication not assigned to a protocol, review manually.      Passed - Valid encounter within last 12 months    Recent Outpatient Visits          1 month ago Essential hypertension   [facility] at [place], [place], [place]   5 months ago Intractable headache, unspecified chronicity pattern, unspecified headache type   [facility] at [facility], Ines Bloomer, MD   7 months ago Essential hypertension   [facility] at [facility], Ines Bloomer, [place]   1 year ago Lightheadedness   [facility] at [place], Ines [place]er, MD   1 year ago Essential hypertension   Primary Care at [facility], Ines Bloomer, MD      Future Appointments            In 4 months [place], Ines [place], MD [facility] at [place], [facility]

## 2019-03-04 ENCOUNTER — Telehealth: Payer: BC Managed Care – PPO | Admitting: Family

## 2019-03-04 DIAGNOSIS — R42 Dizziness and giddiness: Secondary | ICD-10-CM

## 2019-03-04 MED ORDER — MECLIZINE HCL 25 MG PO CHEW: 25.0000 mg | CHEWABLE_TABLET | Freq: Three times a day (TID) | ORAL | 0 refills | Status: DC | PRN

## 2019-03-04 NOTE — Progress Notes (Signed)
Greater than 5 minutes, yet less than 10 minutes of time have been spent researching, coordinating, and implementing care for this patient today.  Thank you for the details you included in the comment boxes. Those details are very helpful in determining the best course of treatment for you and help [place] to provide the best care.  See plan below. If this does not improve, you will likely need to follow-up again with the doctors treating this long-term. See below.  E Visit for Motion Sickness  We are sorry that you are not feeling well. Here is how we plan to help!  Based on what you have shared with me it looks like you have symptoms of motion sickness.  I have prescribed a medication that will help prevent or alleviate your symptoms:  Meclizine 25mg  by mouth three times per day as needed for nausea/motion sickness   Prevention:  You might feel better if you keep your eyes focused on outside while you are in motion. For example, if you are in a car, sit in the front and look in the direction you are moving; if you are on a boat, stay on the deck and look to the horizon. This helps make what you see match the movement you are feeling, and so you are less likely to feel sick.  You should also avoid reading, watching a movie, texting or reading messages, or looking at things close to you inside the vehicle you are riding in.  . Use the seat head rest. Lean your head against the back of the seat or head rest when traveling in vehicles with seats to minimize head movements.  . On a ship: When making your reservations, choose a cabin in the middle of the ship and near the waterline. When on board, go up on deck and focus on the horizon.  . In an airplane: Request a window seat and look out the window. A seat over the front edge of the wing is the most preferable spot (the degree of motion is the lowest here). Direct the air vent to blow cool air on your face.  . On a train: Always face forward  and sit near a window.  . In a vehicle: Sit in the front seat; if you are the passenger, look at the scenery in the distance. For some people, driving the vehicle (rather than being a passenger) is an instant remedy.  . Avoid others who have become nauseous with motion sickness. Seeing and smelling others who have motion sickness may cause you to become sick.  GET HELP RIGHT AWAY IF:   Your symptoms do not improve or worsen within 2 days after treatment.   You cannot keep down fluids after trying the medication.   Other associated symptoms such as severe headache, visual field changes, fever, or intractable nausea and vomiting.  MAKE SURE YOU:   Understand these instructions.  Will watch your condition.  Will get help right away if you are not doing well or get worse.  Thank you for choosing an e-visit.  Your e-visit answers were reviewed by a board certified advanced clinical practitioner to complete your personal care plan. Depending upon the condition, your plan could have included both over the counter or prescription medications.  Please review your pharmacy choice. Be sure that the pharmacy you have chosen is open so that you can pick up your prescription now.  If there is a problem you may message your provider in [place] to have  the prescription routed to another pharmacy.  Your safety is important to [place]. If you have drug allergies check your prescription carefully.   For the next 24 hours, you can use MyChart to ask questions about today's visit, request a non-urgent call back, or ask for a work or school excuse from your e-visit provider.  You will get an e-mail in the next two days asking about your experience. I hope that your e-visit has been valuable and will speed your recovery.   References or for more  information: [email or web address].com.ee [email or web address] [email or web address].uptodate.com

## 2019-03-30 ENCOUNTER — Other Ambulatory Visit: Payer: Self-pay

## 2019-03-30 ENCOUNTER — Telehealth (INDEPENDENT_AMBULATORY_CARE_PROVIDER_SITE_OTHER): Payer: BC Managed Care – PPO | Admitting: Family Medicine

## 2019-03-30 DIAGNOSIS — K219 Gastro-esophageal reflux disease without esophagitis: Secondary | ICD-10-CM | POA: Diagnosis not present

## 2019-03-30 MED ORDER — FAMOTIDINE 20 MG PO TABS: 20.0000 mg | ORAL_TABLET | Freq: Two times a day (BID) | ORAL | 5 refills | Status: DC

## 2019-03-30 NOTE — Progress Notes (Signed)
Telemedicine Encounter- SOAP NOTE Established Patient  I discussed the limitations, risks, security and privacy concerns of performing an evaluation and management service by telephone and the availability of in person appointments. I also discussed with the patient that there may be a patient responsible charge related to this service. The patient expressed understanding and agreed to proceed.  This telephone encounter was conducted with the patient's  verbal consent via audio telecommunications:yes Patient was instructed to have this encounter in a suitably private space; and to only have persons present to whom they give permission to participate. In addition, patient identity was confirmed by use of name plus two identifiers (DOB and address).  I spent a total of 63min talking with the patient   cc Signing Visit        4-5 nauseated after eating. Vertigo feeling is coming back          Heidi Herrera is a 53 y.o. female established patient. Telephone visit today for nausea after eating  HPI  Pt with nausea after eating. Pt state she has a queasy feeling in the morning. Pt eating "plant based diet" for the last 8 months. Pt introduced meatless beef -made spaghetti and did not feel well  after eating that. No vomiting. No fever.  Pt with no abnormal stools. Bad taste in the mouth. Pt tries to avoid processed foods due to kidney disease.  Pt with concerns about kidney-test results in Jan. Sees nephrologist   IBS was diagnosed in the past .  Pt states triggers in the past did include stress and pt has been stressed with kidney concerns and COVID news.    Gastroenterologist recommended probiotics in the past-pt tried probiotics with some relief of symptoms but has not been using on a regular basis. Pt states she was scheduled for upper and lower endoscope but likely will be cancelled due to COVID.   Pt states she is using a whole lemon in her tea as well as ginger to try non  medications for current nausea. Pt denies vomiting or bowel changes. Pt has used NO OTC meds or RX for nausea/GERD.     Patient Active Problem List   Diagnosis Date Noted  . Breast cancer screening 01/25/2018  . Lightheadedness 01/25/2018  . Menorrhagia with regular cycle 01/25/2018  . Body mass index (BMI) of 32.0-32.9 in adult 01/25/2018  . Situational mixed anxiety and depressive disorder 01/25/2018  . CKD (chronic kidney disease) stage 3, GFR 30-59 ml/min (HCC) 04/15/2017  . Sore throat 03/23/2017  . Fibroid 12/01/2016  . Fibroid, uterine   . Essential hypertension 09/29/2014  . Chronic anemia 02/02/2014  . VITAMIN D DEFICIENCY 12/18/2010  . HYPERLIPIDEMIA 12/18/2010  . CHEST PAIN 12/18/2010  . NEPHROLITHIASIS 03/24/2010  . 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 GRANULOMA 10/09/2009  . ESOPHAGEAL STRICTURE 08/27/2009  . GERD 08/27/2009  . Anxiety state 04/20/2008  . HYPERTENSION 04/20/2008  . OBESITY, NOS 02/10/2007  . HYPERTENSION, BENIGN SYSTEMIC 02/10/2007  . FIBROADENOSIS, BREAST 02/10/2007    Past Medical History:  Diagnosis Date  . Acid reflux   . Allergy   . Anemia   . Anxiety   . Fibroids   . GERD (gastroesophageal reflux disease)   . Hypertension   . Kidney cysts   . Migraine   . Obesity   . Sebaceous cyst   . Vertigo     Current Outpatient Medications  Medication Sig Dispense Refill  . Ascorbic Acid (VITAMIN C) 1000 MG tablet Take  2,000 mg by mouth 2 (two) times daily.     . Biotin 1000 MCG tablet Take 1,000 mcg by mouth daily.     . calcium-vitamin D (OSCAL WITH D) 500-200 MG-UNIT per tablet Take 1 tablet by mouth 2 (two) times daily. Calcium 1000 mg with Magnesium 400 mg and zinc 15 mg, taking 1 capsule 2 times daily    . cetirizine (ZYRTEC) 10 MG tablet Take 1 tablet (10 mg total) by mouth daily. 90 tablet 3  . Cyanocobalamin (VITAMIN B 12 PO) Take 1 tablet by mouth daily.    . iron polysaccharides (NIFEREX) 150 MG capsule Take 1 capsule (150 mg total)  by mouth 2 (two) times daily. 60 capsule 3  . lisinopril-hydrochlorothiazide (PRINZIDE,ZESTORETIC) 20-25 MG tablet Take 1 tablet by mouth daily. 90 tablet 3  . butalbital-acetaminophen-caffeine (FIORICET, ESGIC) 50-325-40 MG tablet Take 1-2 tablets by mouth every 6 (six) hours as needed for headache. (Patient not taking: Reported on 03/30/2019) 20 tablet 0  . co-enzyme Q-10 30 MG capsule Take 200 mg by mouth daily.    . IFEREX 150 150 MG capsule Take 1 capsule by mouth twice daily (Patient not taking: Reported on 03/30/2019) 60 capsule 0   No current facility-administered medications for this visit.     Allergies  Allergen Reactions  . Bystolic [Nebivolol Hcl] Anaphylaxis, Itching and Swelling  . Biaxin [Clarithromycin]     The category of the Biaxin family.  Also allergic to water chestnuts.  . Erythromycin Base Rash    Social History   Socioeconomic History  . Marital status: Divorced    Spouse name: Not on file  . Number of children: 1  . Years of education: Not on file  . Highest education level: Master's degree (e.g., MA, MS, MEng, MEd, MSW, MBA)  Occupational History    Comment: [employer]  Social Needs  . Financial resource strain: Not on file  . Food insecurity:    Worry: Not on file    Inability: Not on file  . Transportation needs:    Medical: Not on file    Non-medical: Not on file  Tobacco Use  . Smoking status: Never Smoker  . Smokeless tobacco: Never Used  Substance and Sexual Activity  . Alcohol use: Not Currently    Alcohol/week: 1.0 standard drinks    Types: 1 Glasses of wine per week    Comment: quit years ago  . Drug use: No  . Sexual activity: Not on file  Lifestyle  . Physical activity:    Days per week: Not on file    Minutes per session: Not on file  . Stress: Not on file  Relationships  . Social connections:    Talks on phone: Not on file    Gets together: Not on file    Attends religious service: Not on file    Active member of club or  organization: Not on file    Attends meetings of clubs or organizations: Not on file    Relationship status: Not on file  . Intimate partner violence:    Fear of current or ex partner: Not on file    Emotionally abused: Not on file    Physically abused: Not on file    Forced sexual activity: Not on file  Other Topics Concern  . Not on file  Social History Narrative   Lives alone   No caffiene    ROS CONSTITUTIONAL: no weight loss,or fever EENT:no  sinus problems, nasal congestion RESP:  no SOB,or cough GI: sour taste in mouth, nausea, NO constipation or diarrhea, NO abdominal pain,or difficulty swallowing, NO vomiting, NO blood in stool, orbowel changes GU: pain with urination Objective   Vitals as reported by the patient:NONE  Gastroesophageal reflux disease without esophagitis pepcid-rx-avoid foods with citrus, spices to decrease symptoms-small meals, head of bed elevated,   I discussed the assessment and treatment plan with the patient. The patient was provided an opportunity to ask questions and all were answered. The patient agreed with the plan and demonstrated an understanding of the instructions.   The patient was advised to call back or seek an in-person evaluation if the symptoms worsen or if the condition fails to improve as anticipated.  I provided 35 minutes of non-face-to-face time during this encounter.  LISA Hannah Beat, MD  Primary Care at [facility] 03-30-19

## 2019-03-30 NOTE — Progress Notes (Signed)
4-5 nausaiated after eating. Vertigo feeling is coming back

## 2019-04-04 ENCOUNTER — Encounter: Payer: BC Managed Care – PPO | Attending: Emergency Medicine | Admitting: Registered"

## 2019-04-04 ENCOUNTER — Other Ambulatory Visit: Payer: Self-pay

## 2019-04-04 DIAGNOSIS — Z6832 Body mass index (BMI) 32.0-32.9, adult: Secondary | ICD-10-CM | POA: Insufficient documentation

## 2019-04-04 NOTE — Progress Notes (Signed)
Medical Nutrition Therapy:  Appt start time: 4:10 end time:  4:42   Assessment:  Primary concerns today: Pt states she wants to know what to eat that is beneficial for chronic kidney disease. Pt states she wants to know how much phosphorus, protein, and potassium because she has been doing some research and saw as important. Recent lab results: Creatinine 1.04 (1.26), GFR 56, A1c 5.8.    Pt sates she was told she no longer has kidney disease based on recent lab results. Pt states she altered her diet by eliminating animal products which helped her numbers decrease. States she is currently having a hard time with balancing breakfast; eats breakfast now about 7 days/week.  Reports having issues with spicy and acidic foods. Reports having some dizziness and believes she is not eating enough.  Reports wanting to follow more of a vegan diet, wants to stay away from chemicals in animal products. States she wants to stay close to natural as possible and not sure if she will ever go back to eating meat. Pt states she wants to have a goal of calories, protein, and carbohydrates for the day that she should be aiming at to stay within that goal. Pt states if she knows a numeric goal and decides to skip breakfast then she will know how to make it up with lunch and dinner. Pt states she is not concerned about kidney disease specific things because her recent lab values improved.  Pt states she has had hypertension since age 30; family history. Pt is very proactive and motivated to make the necessary changes that benefit her body.    Preferred Learning Style:   No preference indicated   Learning Readiness:   Ready  Change in progress   MEDICATIONS: See list   DIETARY INTAKE:  Usual eating pattern includes 2-3 meals and 2 snacks per day.  Everyday foods include: plant-based items beans (lima, kidney, navy), broccoli, cauliflower, green beans, rice, corn, bread, potatoes, plant-based sausage/meat,  salmon (rarely)   Avoided foods include beef.  Food allergies: water chestnuts (low-risk, causes itching)  24-hr recall:  B ( AM): sometimes skips; veggie sausage + pancakes or smoothie (fruit + spinach + protein powder) or pea protein almond milk (8 g) + protein powder (11 g PRO, 1 g CHO) Snk ( AM): none  L (12:30 PM):1 c rice + 1 c navy beans  Snk ( PM): unsalted sunflower seeds D (8 PM): cabbage salad + carrots  Snk ( PM): none Beverages: warm lemon tea & ginger, water, herbal tea  Usual physical activity: none lately due to recent pandemic and gym being closed; treadmill, elliptical 41min at least 3x/week  Estimated energy needs: 1800 calories 200 g carbohydrates 135 g protein 50 g fat  Progress Towards Goal(s):  In progress.   Nutritional Diagnosis:  NB-1.1 Food and nutrition-related knowledge deficit As related to lack of prior nutrition-related information.  As evidenced by pt report of recent diagnosis and not knowing what that means for her nutritionally.    Intervention:  Nutrition education and counseling. Pt was educated and counseled on the benefits of having a variety of food groups in each meal and having meals consistently throughout the day. Pt was not satisfied with information given and would like to be scheduled with someone who will give her specific numbers for daily caloric goal intake and macronutrient needs so that she does not gain weight. Pt was informed that I educate and counsel from a Health At Every Size  perspective with the focus of doing what is healthy for her body without  focusing on weight and would seek to find a colleague to better meet her needs. Pt was in agreement with appreciated my willingness to help meet her needs.   Teaching Method Utilized:  Visual Auditory Hands on  Handouts given during visit include:  Chronic Kidney Disease Nutrition Guidelines  Barriers to learning/adherence to lifestyle change: none identified, action stage of  change  Demonstrated degree of understanding via:  Teach Back   Monitoring/Evaluation:  Dietary intake, exercise, and body weight prn.

## 2019-04-12 ENCOUNTER — Telehealth: Payer: Self-pay | Admitting: Emergency Medicine

## 2019-04-12 NOTE — Telephone Encounter (Signed)
Copied from [place] [id number]. Topic: Referral - Request for Referral >> Apr 12, 2019  8:47 AM Sheran Luz wrote: Has patient seen PCP for this complaint? Yes   Referral for which specialty: Patient would like to see renal dietitian. No location or provider preference.

## 2019-04-13 NOTE — Telephone Encounter (Signed)
She was already referred and seen by a competent dietitian on 04/04/2019.  Not sure what else she needs or wants.  Thanks.

## 2019-04-13 NOTE — Telephone Encounter (Signed)
please refer is appropriate

## 2019-04-27 NOTE — Telephone Encounter (Signed)
Pt is looking for a renal dietician and one that is plant based. Please advise pt.

## 2019-05-11 ENCOUNTER — Encounter: Payer: BC Managed Care – PPO | Attending: Emergency Medicine | Admitting: Dietician

## 2019-05-11 ENCOUNTER — Encounter: Payer: Self-pay | Admitting: Dietician

## 2019-05-11 DIAGNOSIS — Z6832 Body mass index (BMI) 32.0-32.9, adult: Secondary | ICD-10-CM | POA: Insufficient documentation

## 2019-05-11 DIAGNOSIS — N183 Chronic kidney disease, stage 3 unspecified: Secondary | ICD-10-CM

## 2019-05-11 NOTE — Progress Notes (Signed)
Medical Nutrition Therapy:  Appt start time: 2694 end time:  1705.  This visit was completed via telephone due to the COVID-19 pandemic.   I spoke with patient and verified that I was speaking with the correct person with two patient identifiers (full name and date of birth).   I discussed the limitations related to this kind of visit and the patient is willing to proceed. Location:  She was in her work parking lot, I in my office. She was the only one on this visit.  Assessment:  Primary concerns today: This is a follow up visit.  She last saw Donetta, RD 03/2019 related to her concerns about Stage 3 CKD.  She states that since August she has been following a vegan diet.  She states that she felt great initially and now feels that she is not eating enough and needs guidance.  She is also trying to follow a gluten free diet as she heard this was best as well as no GMO.  She also does intermittent fasting at times.  Because of the CKD she also has been following a low sodium, low potassium, low phosphorous diet. Vitamins include:  B-12, iron, and biotin  History includes CKD stage 3 (GFR 59 in January 2020 per patient), IBS, HTN and meets criteria for prediabetes with an A1C of 5.8% 01/15/18.   She reports having some problems with nausea and saw her PCP.  She wonders if this was due to increased stress and/or taking very high doses of vitamin C.  She reports of weight of 184 lbs which is stable.     Preferred Learning Style:   No preference indicated   Learning Readiness:   Ready  Change in progress   DIETARY INTAKE: Avoided foods include Peanut butter ("makes me itch a little")  - itches when she eats hummus and water chestnuts Goal of 20 grams of protein per meal (52-60 grams protein per day) 24-hr recall:  B ( AM): steel cut oats, pea protein milk, organic protein powder occasionally   Snk ( AM): almonds or other unsalted nuts or seeds or almond butter L ( PM): Beans, Basmati rice,  salad Snk ( PM): similar to am or protein bar D ( PM): beans, rice, broccoli or other vegetables such as purple sweet potato and cauliflower OR smoothie with berries, spinach, apple, protein powder and occasional beyond meat products Snk ( PM): none Beverages: water, tea with agave  Usual physical activity: was going to the gym but gyms have closed due to covid  Estimated energy needs: 50-65 g protein  Progress Towards Goal(s):  In progress.   Nutritional Diagnosis:  NB-1.1 Food and nutrition-related knowledge deficit As related to vegan diet, CKD.  As evidenced by diet hx and patient report.    Intervention:  Nutrition counseling/instruction.  Discussed vegan diet and nutritional needs.  Discussed CKD and that it is not necessary unless her potassium is elevated and that potassium is beneficial with HTN.  Discussed that a gluten free diet is not needed unless celiac or intolerant.  Discussed meal planning ideas and resource which include meal plans.  Discussed benefits of exercise and stress control.  Resources: Vegan for Her by Clint Bolder, [place] for Live [phone]) by Sandria Manly, RD and Clint Bolder, [place] Becoming Vegan by Narda Amber, RD and Donnald Garre, RD Plant Strong Podcast (from [place] app or plantstrong.[email or web address]) [email or web address] Vegan [email or web address] Continue a low sodium diet full of fruits and vegetables, whole grains, and legumes. Stay  active. Stress control  Handouts given during visit include:  [place]uoe  GERD Handout  Salad- mixing it up  Plant based eating   Barriers to learning/adherence to lifestyle change: none  Demonstrated degree of understanding via:  Teach Back   Monitoring/Evaluation:  Dietary intake, exercise, and body weight prn.

## 2019-05-11 NOTE — Patient Instructions (Addendum)
Resources: Vegan for Her by Clint Bolder, [place] for Live [phone]) by Sandria Manly, RD and Clint Bolder, [place] Becoming Vegan by Narda Amber, RD and Donnald Garre, RD  Plant Strong Podcast (from [place] [employer] app or [email or web address]) [email or web address] [email or web address]  Continue a low sodium diet full of fruits and vegetables, whole grains, and legumes. Stay active. Stress control

## 2019-06-05 ENCOUNTER — Encounter: Payer: Self-pay | Admitting: Emergency Medicine

## 2019-06-28 ENCOUNTER — Other Ambulatory Visit: Payer: Self-pay | Admitting: Emergency Medicine

## 2019-06-28 NOTE — Telephone Encounter (Signed)
Requested medication (s) are due for refill today:  Overdue; appears to be noncompliant in taking  Requested medication (s) are on the active medication list:  yes  Future visit scheduled:  yes  Last Refill: 02/28/19; #60; no refills     Requested Prescriptions  Pending Prescriptions Disp Refills   IFEREX 150 150 MG capsule [Pharmacy Med Name: IFerex 150 150 MG Oral Capsule] 60 capsule 0    Sig: Take 1 capsule by mouth twice daily     Off-Protocol Failed - 06/28/2019 11:30 AM      Failed - Medication not assigned to a protocol, review manually.      Passed - Valid encounter within last 12 months    Recent Outpatient Visits          5 months ago Essential hypertension   Primary Care at [facility], [place], [place]   9 months ago Intractable headache, unspecified chronicity pattern, unspecified headache type   Primary Care at [facility], [place]er, [place]   11 months ago Essential hypertension   Primary Care at [facility], [place]er, [place]   1 year ago Lightheadedness   Primary Care at [place], Ines Bloomer, [place]   1 year ago Essential hypertension   Primary Care at [facility], Ines Bloomer, [place]            In 1 week [place], Ines Bloomer, MD Primary Care at [place], [facility]

## 2019-07-05 ENCOUNTER — Ambulatory Visit: Payer: BC Managed Care – PPO | Admitting: Emergency Medicine

## 2019-07-27 ENCOUNTER — Encounter: Payer: Self-pay | Admitting: Emergency Medicine

## 2019-07-27 ENCOUNTER — Ambulatory Visit: Payer: BC Managed Care – PPO | Admitting: Emergency Medicine

## 2019-07-27 ENCOUNTER — Other Ambulatory Visit: Payer: Self-pay

## 2019-07-27 VITALS — BP 129/78 | HR 64 | Temp 98.9°F | Resp 16 | Wt 183.8 lb

## 2019-07-27 DIAGNOSIS — Z8719 Personal history of other diseases of the digestive system: Secondary | ICD-10-CM

## 2019-07-27 DIAGNOSIS — F418 Other specified anxiety disorders: Secondary | ICD-10-CM | POA: Diagnosis not present

## 2019-07-27 DIAGNOSIS — N183 Chronic kidney disease, stage 3 unspecified: Secondary | ICD-10-CM

## 2019-07-27 DIAGNOSIS — R350 Frequency of micturition: Secondary | ICD-10-CM | POA: Diagnosis not present

## 2019-07-27 DIAGNOSIS — D509 Iron deficiency anemia, unspecified: Secondary | ICD-10-CM

## 2019-07-27 DIAGNOSIS — I10 Essential (primary) hypertension: Secondary | ICD-10-CM

## 2019-07-27 MED ORDER — LISINOPRIL-HYDROCHLOROTHIAZIDE 20-12.5 MG PO TABS: 1.0000 | ORAL_TABLET | Freq: Every day | ORAL | 3 refills | Status: DC

## 2019-07-27 NOTE — Patient Instructions (Addendum)
   If you have lab work done today you will be contacted with your lab results within the next 2 weeks.  If you have not heard from us then please contact us. The fastest way to get your results is to register for My Chart.   IF you received an x-ray today, you will receive an invoice from [facility]. Please contact [facility] at [phone] with questions or concerns regarding your invoice.   IF you received labwork today, you will receive an invoice from [facility]. Please contact [facility] at [phone] with questions or concerns regarding your invoice.   Our billing staff will not be able to assist you with questions regarding bills from these companies.  You will be contacted with the lab results as soon as they are available. The fastest way to get your results is to activate your My Chart account. Instructions are located on the last page of this paperwork. If you have not heard from us regarding the results in 2 weeks, please contact this office.     Hypertension, Adult High blood pressure (hypertension) is when the force of blood pumping through the arteries is too strong. The arteries are the blood vessels that carry blood from the heart throughout the body. Hypertension forces the heart to work harder to pump blood and may cause arteries to become narrow or stiff. Untreated or uncontrolled hypertension can cause a heart attack, heart failure, a stroke, kidney disease, and other problems. A blood pressure reading consists of a higher number over a lower number. Ideally, your blood pressure should be below 120/80. The first ("top") number is called the systolic pressure. It is a measure of the pressure in your arteries as your heart beats. The second ("bottom") number is called the diastolic pressure. It is a measure of the pressure in your arteries as the heart relaxes. What are the causes? The exact cause of this condition is not known. There are some conditions  that result in or are related to high blood pressure. What increases the risk? Some risk factors for high blood pressure are under your control. The following factors may make you more likely to develop this condition:  Smoking.  Having type 2 diabetes mellitus, high cholesterol, or both.  Not getting enough exercise or physical activity.  Being overweight.  Having too much fat, sugar, calories, or salt (sodium) in your diet.  Drinking too much alcohol. Some risk factors for high blood pressure may be difficult or impossible to change. Some of these factors include:  Having chronic kidney disease.  Having a family history of high blood pressure.  Age. Risk increases with age.  Race. You may be at higher risk if you are African American.  Gender. Men are at higher risk than women before age 45. After age 65, women are at higher risk than men.  Having obstructive sleep apnea.  Stress. What are the signs or symptoms? High blood pressure may not cause symptoms. Very high blood pressure (hypertensive crisis) may cause:  Headache.  Anxiety.  Shortness of breath.  Nosebleed.  Nausea and vomiting.  Vision changes.  Severe chest pain.  Seizures. How is this diagnosed? This condition is diagnosed by measuring your blood pressure while you are seated, with your arm resting on a flat surface, your legs uncrossed, and your feet flat on the floor. The cuff of the blood pressure monitor will be placed directly against the skin of your upper arm at the level of your heart.   It should be measured at least twice using the same arm. Certain conditions can cause a difference in blood pressure between your right and left arms. Certain factors can cause blood pressure readings to be lower or higher than normal for a short period of time:  When your blood pressure is higher when you are in a health care provider's office than when you are at home, this is called white coat hypertension.  Most people with this condition do not need medicines.  When your blood pressure is higher at home than when you are in a health care provider's office, this is called masked hypertension. Most people with this condition may need medicines to control blood pressure. If you have a high blood pressure reading during one visit or you have normal blood pressure with other risk factors, you may be asked to:  Return on a different day to have your blood pressure checked again.  Monitor your blood pressure at home for 1 week or longer. If you are diagnosed with hypertension, you may have other blood or imaging tests to help your health care provider understand your overall risk for other conditions. How is this treated? This condition is treated by making healthy lifestyle changes, such as eating healthy foods, exercising more, and reducing your alcohol intake. Your health care provider may prescribe medicine if lifestyle changes are not enough to get your blood pressure under control, and if:  Your systolic blood pressure is above 130.  Your diastolic blood pressure is above 80. Your personal target blood pressure may vary depending on your medical conditions, your age, and other factors. Follow these instructions at home: Eating and drinking   Eat a diet that is high in fiber and potassium, and low in sodium, added sugar, and fat. An example eating plan is called the DASH (Dietary Approaches to Stop Hypertension) diet. To eat this way: ? Eat plenty of fresh fruits and vegetables. Try to fill one half of your plate at each meal with fruits and vegetables. ? Eat whole grains, such as whole-wheat pasta, brown rice, or whole-grain bread. Fill about one fourth of your plate with whole grains. ? Eat or drink low-fat dairy products, such as skim milk or low-fat yogurt. ? Avoid fatty cuts of meat, processed or cured meats, and poultry with skin. Fill about one fourth of your plate with lean proteins, such  as fish, chicken without skin, beans, eggs, or tofu. ? Avoid pre-made and processed foods. These tend to be higher in sodium, added sugar, and fat.  Reduce your daily sodium intake. Most people with hypertension should eat less than 1,500 mg of sodium a day.  Do not drink alcohol if: ? Your health care provider tells you not to drink. ? You are pregnant, may be pregnant, or are planning to become pregnant.  If you drink alcohol: ? Limit how much you use to:  0-1 drink a day for women.  0-2 drinks a day for men. ? Be aware of how much alcohol is in your drink. In the [place] one drink equals one 12 oz bottle of beer (355 mL), one 5 oz glass of wine (148 mL), or one 1 oz glass of hard liquor (44 mL). Lifestyle   Work with your health care provider to maintain a healthy body weight or to lose weight. Ask what an ideal weight is for you.  Get at least 30 minutes of exercise most days of the week. Activities may include walking, swimming, or   biking.  Include exercise to strengthen your muscles (resistance exercise), such as Pilates or lifting weights, as part of your weekly exercise routine. Try to do these types of exercises for 30 minutes at least 3 days a week.  Do not use any products that contain nicotine or tobacco, such as cigarettes, e-cigarettes, and chewing tobacco. If you need help quitting, ask your health care provider.  Monitor your blood pressure at home as told by your health care provider.  Keep all follow-up visits as told by your health care provider. This is important. Medicines  Take over-the-counter and prescription medicines only as told by your health care provider. Follow directions carefully. Blood pressure medicines must be taken as prescribed.  Do not skip doses of blood pressure medicine. Doing this puts you at risk for problems and can make the medicine less effective.  Ask your health care provider about side effects or reactions to medicines that you  should watch for. Contact a health care provider if you:  Think you are having a reaction to a medicine you are taking.  Have headaches that keep coming back (recurring).  Feel dizzy.  Have swelling in your ankles.  Have trouble with your vision. Get help right away if you:  Develop a severe headache or confusion.  Have unusual weakness or numbness.  Feel faint.  Have severe pain in your chest or abdomen.  Vomit repeatedly.  Have trouble breathing. Summary  Hypertension is when the force of blood pumping through your arteries is too strong. If this condition is not controlled, it may put you at risk for serious complications.  Your personal target blood pressure may vary depending on your medical conditions, your age, and other factors. For most people, a normal blood pressure is less than 120/80.  Hypertension is treated with lifestyle changes, medicines, or a combination of both. Lifestyle changes include losing weight, eating a healthy, low-sodium diet, exercising more, and limiting alcohol. This information is not intended to replace advice given to you by your health care provider. Make sure you discuss any questions you have with your health care provider. Document Released: 11/30/2005 Document Revised: 08/10/2018 Document Reviewed: 08/10/2018 Elsevier Patient Education  [place].  Urinary Frequency, Adult Urinary frequency means urinating more often than usual. You may urinate every 1-2 hours even though you drink a normal amount of fluid and do not have a bladder infection or condition. Although you urinate more often than normal, the total amount of urine produced in a day is normal. With urinary frequency, you may have an urgent need to urinate often. The stress and anxiety of needing to find a bathroom quickly can make this urge worse. This condition may go away on its own or you may need treatment at home. Home treatment may include bladder training,  exercises, taking medicines, or making changes to your diet. Follow these instructions at home: Bladder health   Keep a bladder diary if told by your health care provider. Keep track of: ? What you eat and drink. ? How often you urinate. ? How much you urinate.  Follow a bladder training program if told by your health care provider. This may include: ? Learning to delay going to the bathroom. ? Double urinating (voiding). This helps if you are not completely emptying your bladder. ? Scheduled voiding.  Do Kegel exercises as told by your health care provider. Kegel exercises strengthen the muscles that help control urination, which may help the condition. Eating and drinking  If told by your health care provider, make diet changes, such as: ? Avoiding caffeine. ? Drinking fewer fluids, especially alcohol. ? Not drinking in the evening. ? Avoiding foods or drinks that may irritate the bladder. These include coffee, tea, soda, artificial sweeteners, citrus, tomato-based foods, and chocolate. ? Eating foods that help prevent or ease constipation. Constipation can make this condition worse. Your health care provider may recommend that you:  Drink enough fluid to keep your urine pale yellow.  Take over-the-counter or prescription medicines.  Eat foods that are high in fiber, such as beans, whole grains, and fresh fruits and vegetables.  Limit foods that are high in fat and processed sugars, such as fried or sweet foods. General instructions  Take over-the-counter and prescription medicines only as told by your health care provider.  Keep all follow-up visits as told by your health care provider. This is important. Contact a health care provider if:  You start urinating more often.  You feel pain or irritation when you urinate.  You notice blood in your urine.  Your urine looks cloudy.  You develop a fever.  You begin vomiting. Get help right away if:  You are unable to  urinate. Summary  Urinary frequency means urinating more often than usual. With urinary frequency, you may urinate every 1-2 hours even though you drink a normal amount of fluid and do not have a bladder infection or other bladder condition.  Your health care provider may recommend that you keep a bladder diary, follow a bladder training program, or make dietary changes.  If told by your health care provider, do Kegel exercises to strengthen the muscles that help control urination.  Take over-the-counter and prescription medicines only as told by your health care provider.  Contact a health care provider if your symptoms do not improve or get worse. This information is not intended to replace advice given to you by your health care provider. Make sure you discuss any questions you have with your health care provider. Document Released: 09/26/2009 Document Revised: 06/09/2018 Document Reviewed: 06/09/2018 Elsevier Patient Education  2020 [employer].

## 2019-07-27 NOTE — Assessment & Plan Note (Signed)
Well-controlled hypertension.  Increase urination might be related to high-dose diuretic.  Will decrease diuretic dose of Zestoretic to 20- 12.5.  Patient has no history of diabetes and was tested last month, negative.  She may have an overactive bladder.  If increased urination does not improve with lowering diuretic dose we will then refer to urologist for further evaluation.

## 2019-07-27 NOTE — Progress Notes (Addendum)
BP Readings from Last 3 Encounters:  07/27/19 129/78  01/04/19 103/67  11/22/18 119/73   Wt Readings from Last 3 Encounters:  07/27/19 183 lb 12.8 oz (83.4 kg)  05/11/19 184 lb (83.5 kg)  01/04/19 192 lb 12.8 oz (87.5 kg)   Lab Results  Component Value Date   CREATININE 1.26 (H) 01/25/2018   BUN 14 01/25/2018   NA 141 01/25/2018   K 4.3 01/25/2018   CL 103 01/25/2018   CO2 24 01/25/2018   [name] 53 y.o.   Chief Complaint  Patient presents with   Hypertension    6 months follow up and follow up kidneys    HISTORY OF PRESENT ILLNESS: This is a 53 y.o. female complaining of several things: 1.  Hypertension follow-up: On lisinopril-hydrochlorothiazide 20-25, doing well.  Normal blood pressures at home. 2.  Increase urination with a need to go every 2 hours. 3.  Chronic kidney disease: Saw nephrologist last month, increased creatinine to 1.3. 4.  Low iron: Taking supplements. 5.  Occasional nausea: Has a history of IBS. 6.  Increase anxiety state brought on by world events as well as several family members with COVID infection.  HPI   Prior to Admission medications   Medication Sig Start Date End Date Taking? Authorizing Provider  cetirizine (ZYRTEC) 10 MG tablet Take 1 tablet (10 mg total) by mouth daily. 12/21/18  Yes Kaaren Nass, Ines Bloomer, MD  Cyanocobalamin (VITAMIN B 12 PO) Take 1 tablet by mouth daily.   Yes [provider]  famotidine (PEPCID) 20 MG tablet Take 1 tablet (20 mg total) by mouth 2 (two) times daily. 03/30/19  Yes Corum, Rex Kras, MD  IFEREX 150 150 MG capsule Take 1 capsule by mouth twice daily 07/05/19  Yes Makaveli Hoard, Ines Bloomer, MD  Ascorbic Acid (VITAMIN C) 1000 MG tablet Take 2,000 mg by mouth 2 (two) times daily.     [provider]  Biotin 1000 MCG tablet Take 1,000 mcg by mouth daily.     [provider]  butalbital-acetaminophen-caffeine (FIORICET, ESGIC) 50-325-40 MG tablet Take 1-2 tablets by mouth every 6 (six)  hours as needed for headache. Patient not taking: Reported on 07/27/2019 09/07/18 09/07/19  Horald Pollen, MD  calcium-vitamin D (OSCAL WITH D) 500-200 MG-UNIT per tablet Take 1 tablet by mouth 2 (two) times daily. Calcium 1000 mg with Magnesium 400 mg and zinc 15 mg, taking 1 capsule 2 times daily    [provider]  co-enzyme Q-10 30 MG capsule Take 200 mg by mouth daily.    [provider]  iron polysaccharides (NIFEREX) 150 MG capsule Take 1 capsule (150 mg total) by mouth 2 (two) times daily. Patient not taking: Reported on 05/11/2019 07/25/18 03/30/19  Horald Pollen, MD  lisinopril-hydrochlorothiazide (PRINZIDE,ZESTORETIC) 20-25 MG tablet Take 1 tablet by mouth daily. 07/25/18 05/11/19  Horald Pollen, MD    Allergies  Allergen Reactions   Thayer Jew Hcl] Anaphylaxis, Itching and Swelling   Biaxin [Clarithromycin]     The category of the Biaxin family.  Also allergic to water chestnuts.   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   GRANULOMA 10/09/2009   ESOPHAGEAL STRICTURE 08/27/2009   Gastroesophageal reflux disease without esophagitis 08/27/2009   FIBROADENOSIS, BREAST 02/10/2007    Past Medical History:  Diagnosis Date   Acid reflux    Allergy    Anemia    Anxiety    Fibroids    GERD (gastroesophageal reflux disease)    Hypertension    Kidney cysts    Migraine    Obesity    Sebaceous cyst    Vertigo     Past Surgical History:  Procedure Laterality Date     IR GENERIC HISTORICAL  12/01/2016   IR ANGIOGRAM PELVIS SELECTIVE OR SUPRASELECTIVE 12/01/2016 Sandi Mariscal, MD [facility]   IR GENERIC HISTORICAL  12/01/2016   IR US GUIDE VASC ACCESS RIGHT 12/01/2016 Sandi Mariscal, MD [facility]   IR GENERIC HISTORICAL  12/01/2016   IR EMBO ARTERIAL NOT HEMORR HEMANG INC GUIDE ROADMAPPING 12/01/2016 Sandi Mariscal, MD [facility]   IR GENERIC HISTORICAL  12/01/2016   IR ANGIOGRAM SELECTIVE EACH ADDITIONAL VESSEL 12/01/2016 Sandi Mariscal, MD [facility]   IR GENERIC HISTORICAL  12/01/2016   IR ANGIOGRAM SELECTIVE EACH ADDITIONAL VESSEL 12/01/2016 Sandi Mariscal, MD [facility]   IR GENERIC HISTORICAL  12/01/2016   IR ANGIOGRAM PELVIS SELECTIVE OR SUPRASELECTIVE 12/01/2016 Sandi Mariscal, MD [facility]   IR GENERIC HISTORICAL  01/07/2017   IR RADIOLOGIST EVAL & MGMT 01/07/2017 Sandi Mariscal, MD [facility]   IR RADIOLOGIST EVAL & MGMT  08/31/2017   LOBECTOMY  2009   MYOMECTOMY  2007   UTERINE FIBROID EMBOLIZATION     12/01/2016    Social History   Socioeconomic History   Marital status: Divorced    Spouse name: Not on file   Number of children: 1   Years of education: Not on file   Highest education level: Master's degree (e.g., MA, MS, MEng, MEd, MSW, MBA)  Occupational History    Comment: [employer]  Social Designer, fashion/clothing strain: Not on file   Food insecurity    Worry: Not on file    Inability: Not on file   Transportation needs    Medical: Not on file    Non-medical: Not on file  Tobacco Use   Smoking status: Never Smoker   Smokeless tobacco: Never Used  Substance and Sexual Activity   Alcohol use: Not Currently    Alcohol/week: 1.0 standard drinks    Types: 1 Glasses of wine per week    Comment: quit years ago   Drug use: No   Sexual activity: Not on file  Lifestyle   Physical activity    Days per week: Not on file    Minutes per session: Not on file   Stress: Not on file  Relationships    Social connections    Talks on phone: Not on file    Gets together: Not on file    Attends religious service: Not on file    Active member of club or organization: Not on file    Attends meetings of clubs or organizations: Not on file    Relationship status: Not on file   Intimate partner violence    Fear of current or ex partner: Not on file    Emotionally abused: Not on file    Physically abused: Not on file    Forced sexual activity: Not on file  Other  Topics Concern   Not on file  Social History Narrative   Lives alone   No caffiene    Family History  Problem Relation Age of Onset   Hypertension Mother    Obesity Mother    Stroke Mother    Diabetes Mother    Hypertension Sister    Hyperlipidemia Sister    Obesity Sister    Diabetes Sister    Heart disease Sister    Mental illness Sister    Glaucoma Sister    Hypertension Brother    Obesity Brother    Stroke Brother    Glaucoma Brother    Mental illness Father    Hypertension Father    Cancer Brother    Stroke Brother    Diabetes Sister    Hypertension Sister    Mental illness Sister    Glaucoma Maternal Uncle    Cancer Sister        colon     Review of Systems  Constitutional: Negative.  Negative for chills and fever.  HENT: Negative.  Negative for congestion and sore throat.   Eyes: Negative.   Respiratory: Negative.  Negative for cough and shortness of breath.   Cardiovascular: Negative.  Negative for chest pain and palpitations.  Gastrointestinal: Positive for nausea. Negative for abdominal pain, blood in stool, constipation, melena and vomiting.  Genitourinary: Positive for frequency. Negative for dysuria, flank pain, hematuria and urgency.  Musculoskeletal: Negative for back pain, myalgias and neck pain.  Skin: Negative.   Neurological: Negative for dizziness and headaches.  Endo/Heme/Allergies: Negative.   Psychiatric/Behavioral: The patient is nervous/anxious.   All  other systems reviewed and are negative.  Vitals:   07/27/19 0817  BP: 129/78  Pulse: 64  Resp: 16  Temp: 98.9 F (37.2 C)  SpO2: 99%     Physical Exam Vitals signs reviewed.  Constitutional:      Appearance: Normal appearance.  HENT:     Head: Normocephalic and atraumatic.  Eyes:     Extraocular Movements: Extraocular movements intact.     Pupils: Pupils are equal, round, and reactive to light.  Neck:     Musculoskeletal: Normal range of motion and neck supple.  Cardiovascular:     Rate and Rhythm: Normal rate and regular rhythm.     Pulses: Normal pulses.     Heart sounds: Normal heart sounds.  Pulmonary:     Effort: Pulmonary effort is normal.     Breath sounds: Normal breath sounds.  Abdominal:     Palpations: Abdomen is soft.     Tenderness: There is no abdominal tenderness.  Musculoskeletal: Normal range of motion.     Right lower leg: No edema.     Left lower leg: No edema.  Skin:    General: Skin is warm and dry.     Capillary Refill: Capillary refill takes less than 2 seconds.  Neurological:     General: No focal deficit present.     Mental Status: She is alert and oriented to person, place, and time.  Psychiatric:        Mood and Affect: Mood normal.        Behavior: Behavior normal.     Essential hypertension Well-controlled hypertension.  Increase urination might be related to high-dose diuretic.  Will decrease diuretic dose of Zestoretic to 20- 12.5.  Patient has no history of diabetes and was tested last month, negative.  She may have an overactive bladder.  If increased urination does not improve with  lowering diuretic dose we will then refer to urologist for further evaluation.    ASSESSMENT & PLAN: Janiah was seen today for hypertension.  Diagnoses and all orders for this visit:  Essential hypertension -     lisinopril-hydrochlorothiazide (ZESTORETIC) 20-12.5 MG tablet; Take 1 tablet by mouth daily.  CKD (chronic kidney disease) stage 3,  GFR 30-59 ml/min ([facility])  Situational anxiety  Urinary frequency  Iron deficiency anemia, unspecified iron deficiency anemia type  History of IBS    Patient Instructions       If you have lab work done today you will be contacted with your lab results within the next 2 weeks.  If you have not heard from [place] then please contact us. The fastest way to get your results is to register for My Chart.   IF you received an x-ray today, you will receive an invoice from [facility]. Please contact [facility] at [phone] with questions or concerns regarding your invoice.   IF you received labwork today, you will receive an invoice from [facility]. Please contact [facility] at [phone] with questions or concerns regarding your invoice.   Our billing staff will not be able to assist you with questions regarding bills from these companies.  You will be contacted with the lab results as soon as they are available. The fastest way to get your results is to activate your My Chart account. Instructions are located on the last page of this paperwork. If you have not heard from [place] regarding the results in 2 weeks, please contact this office.     Hypertension, Adult High blood pressure (hypertension) is when the force of blood pumping through the arteries is too strong. The arteries are the blood vessels that carry blood from the heart throughout the body. Hypertension forces the heart to work harder to pump blood and may cause arteries to become narrow or stiff. Untreated or uncontrolled hypertension can cause a heart attack, heart failure, a stroke, kidney disease, and other problems. A blood pressure reading consists of a higher number over a lower number. Ideally, your blood pressure should be below 120/80. The first ("top") number is called the systolic pressure. It is a measure of the pressure in your arteries as your heart beats. The second ("bottom") number is called the  diastolic pressure. It is a measure of the pressure in your arteries as the heart relaxes. What are the causes? The exact cause of this condition is not known. There are some conditions that result in or are related to high blood pressure. What increases the risk? Some risk factors for high blood pressure are under your control. The following factors may make you more likely to develop this condition:  Smoking.  Having type 2 diabetes mellitus, high cholesterol, or both.  Not getting enough exercise or physical activity.  Being overweight.  Having too much fat, sugar, calories, or salt (sodium) in your diet.  Drinking too much alcohol. Some risk factors for high blood pressure may be difficult or impossible to change. Some of these factors include:  Having chronic kidney disease.  Having a family history of high blood pressure.  Age. Risk increases with age.  Race. You may be at higher risk if you are African American.  Gender. Men are at higher risk than women before age 62. After age 55, women are at higher risk than men.  Having obstructive sleep apnea.  Stress. What are the signs or symptoms? High blood pressure may not cause symptoms.  Very high blood pressure (hypertensive crisis) may cause:  Headache.  Anxiety.  Shortness of breath.  Nosebleed.  Nausea and vomiting.  Vision changes.  Severe chest pain.  Seizures. How is this diagnosed? This condition is diagnosed by measuring your blood pressure while you are seated, with your arm resting on a flat surface, your legs uncrossed, and your feet flat on the floor. The cuff of the blood pressure monitor will be placed directly against the skin of your upper arm at the level of your heart. It should be measured at least twice using the same arm. Certain conditions can cause a difference in blood pressure between your right and left arms. Certain factors can cause blood pressure readings to be lower or higher than  normal for a short period of time:  When your blood pressure is higher when you are in a health care provider's office than when you are at home, this is called white coat hypertension. Most people with this condition do not need medicines.  When your blood pressure is higher at home than when you are in a health care provider's office, this is called masked hypertension. Most people with this condition may need medicines to control blood pressure. If you have a high blood pressure reading during one visit or you have normal blood pressure with other risk factors, you may be asked to:  Return on a different day to have your blood pressure checked again.  Monitor your blood pressure at home for 1 week or longer. If you are diagnosed with hypertension, you may have other blood or imaging tests to help your health care provider understand your overall risk for other conditions. How is this treated? This condition is treated by making healthy lifestyle changes, such as eating healthy foods, exercising more, and reducing your alcohol intake. Your health care provider may prescribe medicine if lifestyle changes are not enough to get your blood pressure under control, and if:  Your systolic blood pressure is above 130.  Your diastolic blood pressure is above 80. Your personal target blood pressure may vary depending on your medical conditions, your age, and other factors. Follow these instructions at home: Eating and drinking   Eat a diet that is high in fiber and potassium, and low in sodium, added sugar, and fat. An example eating plan is called the DASH (Dietary Approaches to Stop Hypertension) diet. To eat this way: ? Eat plenty of fresh fruits and vegetables. Try to fill one half of your plate at each meal with fruits and vegetables. ? Eat whole grains, such as whole-wheat pasta, brown rice, or whole-grain bread. Fill about one fourth of your plate with whole grains. ? Eat or drink low-fat  dairy products, such as skim milk or low-fat yogurt. ? Avoid fatty cuts of meat, processed or cured meats, and poultry with skin. Fill about one fourth of your plate with lean proteins, such as fish, chicken without skin, beans, eggs, or tofu. ? Avoid pre-made and processed foods. These tend to be higher in sodium, added sugar, and fat.  Reduce your daily sodium intake. Most people with hypertension should eat less than 1,500 mg of sodium a day.  Do not drink alcohol if: ? Your health care provider tells you not to drink. ? You are pregnant, may be pregnant, or are planning to become pregnant.  If you drink alcohol: ? Limit how much you use to:  0-1 drink a day for women.  0-2 drinks a day for  men. ? Be aware of how much alcohol is in your drink. In the [place] one drink equals one 12 oz bottle of beer (355 mL), one 5 oz glass of wine (148 mL), or one 1 oz glass of hard liquor (44 mL). Lifestyle   Work with your health care provider to maintain a healthy body weight or to lose weight. Ask what an ideal weight is for you.  Get at least 30 minutes of exercise most days of the week. Activities may include walking, swimming, or biking.  Include exercise to strengthen your muscles (resistance exercise), such as Pilates or lifting weights, as part of your weekly exercise routine. Try to do these types of exercises for 30 minutes at least 3 days a week.  Do not use any products that contain nicotine or tobacco, such as cigarettes, e-cigarettes, and chewing tobacco. If you need help quitting, ask your health care provider.  Monitor your blood pressure at home as told by your health care provider.  Keep all follow-up visits as told by your health care provider. This is important. Medicines  Take over-the-counter and prescription medicines only as told by your health care provider. Follow directions carefully. Blood pressure medicines must be taken as prescribed.  Do not skip doses of blood  pressure medicine. Doing this puts you at risk for problems and can make the medicine less effective.  Ask your health care provider about side effects or reactions to medicines that you should watch for. Contact a health care provider if you:  Think you are having a reaction to a medicine you are taking.  Have headaches that keep coming back (recurring).  Feel dizzy.  Have swelling in your ankles.  Have trouble with your vision. Get help right away if you:  Develop a severe headache or confusion.  Have unusual weakness or numbness.  Feel faint.  Have severe pain in your chest or abdomen.  Vomit repeatedly.  Have trouble breathing. Summary  Hypertension is when the force of blood pumping through your arteries is too strong. If this condition is not controlled, it may put you at risk for serious complications.  Your personal target blood pressure may vary depending on your medical conditions, your age, and other factors. For most people, a normal blood pressure is less than 120/80.  Hypertension is treated with lifestyle changes, medicines, or a combination of both. Lifestyle changes include losing weight, eating a healthy, low-sodium diet, exercising more, and limiting alcohol. This information is not intended to replace advice given to you by your health care provider. Make sure you discuss any questions you have with your health care provider. Document Released: 11/30/2005 Document Revised: 08/10/2018 Document Reviewed: 08/10/2018 Elsevier Patient Education  [place].  Urinary Frequency, Adult Urinary frequency means urinating more often than usual. You may urinate every 1-2 hours even though you drink a normal amount of fluid and do not have a bladder infection or condition. Although you urinate more often than normal, the total amount of urine produced in a day is normal. With urinary frequency, you may have an urgent need to urinate often. The stress and anxiety  of needing to find a bathroom quickly can make this urge worse. This condition may go away on its own or you may need treatment at home. Home treatment may include bladder training, exercises, taking medicines, or making changes to your diet. Follow these instructions at home: Bladder health   Keep a bladder diary if told by your health  care provider. Keep track of: ? What you eat and drink. ? How often you urinate. ? How much you urinate.  Follow a bladder training program if told by your health care provider. This may include: ? Learning to delay going to the bathroom. ? Double urinating (voiding). This helps if you are not completely emptying your bladder. ? Scheduled voiding.  Do Kegel exercises as told by your health care provider. Kegel exercises strengthen the muscles that help control urination, which may help the condition. Eating and drinking  If told by your health care provider, make diet changes, such as: ? Avoiding caffeine. ? Drinking fewer fluids, especially alcohol. ? Not drinking in the evening. ? Avoiding foods or drinks that may irritate the bladder. These include coffee, tea, soda, artificial sweeteners, citrus, tomato-based foods, and chocolate. ? Eating foods that help prevent or ease constipation. Constipation can make this condition worse. Your health care provider may recommend that you:  Drink enough fluid to keep your urine pale yellow.  Take over-the-counter or prescription medicines.  Eat foods that are high in fiber, such as beans, whole grains, and fresh fruits and vegetables.  Limit foods that are high in fat and processed sugars, such as fried or sweet foods. General instructions  Take over-the-counter and prescription medicines only as told by your health care provider.  Keep all follow-up visits as told by your health care provider. This is important. Contact a health care provider if:  You start urinating more often.  You feel pain or  irritation when you urinate.  You notice blood in your urine.  Your urine looks cloudy.  You develop a fever.  You begin vomiting. Get help right away if:  You are unable to urinate. Summary  Urinary frequency means urinating more often than usual. With urinary frequency, you may urinate every 1-2 hours even though you drink a normal amount of fluid and do not have a bladder infection or other bladder condition.  Your health care provider may recommend that you keep a bladder diary, follow a bladder training program, or make dietary changes.  If told by your health care provider, do Kegel exercises to strengthen the muscles that help control urination.  Take over-the-counter and prescription medicines only as told by your health care provider.  Contact a health care provider if your symptoms do not improve or get worse. This information is not intended to replace advice given to you by your health care provider. Make sure you discuss any questions you have with your health care provider. Document Released: 09/26/2009 Document Revised: 06/09/2018 Document Reviewed: 06/09/2018 Elsevier Patient Education  2020 Elsevier Inc.      Agustina Caroli, MD [facility]

## 2019-08-15 ENCOUNTER — Ambulatory Visit: Payer: Self-pay

## 2019-08-15 NOTE — Telephone Encounter (Signed)
Incoming call from Patient stating last month Patient was taken offf one of her Blood pressure medication. Due to frequent urination. Patient states that blood Pressure is starting to increase   141/104  1797 Reports a dull headache.     Patient request a return phone call. Please  Answer Assessment - Initial Assessment Questions 1.   NAME of MEDICATION: "What medicine are you calling about?"     ? diuretic2.   QUESTION: "What is your question?"    Want to know if it is something else she an ta besides      Diuretic 3.   PRESCRIBING HCP: "Who prescribed it?" Reason: if prescribed by specialist, call should be referred to that group.     Dr. Franky Macho 4. SYMPTOMS: "Do you have any symptoms?"      Blood Pressure increasing.   5. SEVERITY: If symptoms are present, ask "Are they mild, moderate or severe?"     Mild to moderate 6.  PREGNANCY:  "Is there any chance that you are pregnant?" "When was your last menstrual period?"     na  Protocols used: MEDICATION QUESTION CALL-A-AH

## 2019-08-17 ENCOUNTER — Telehealth: Payer: Self-pay

## 2019-08-17 NOTE — Telephone Encounter (Signed)
Copied from [place] [id number]. Topic: Quick Communication - See Telephone Encounter >> Aug 17, 2019 10:50 AM Erick Blinks wrote: CRM for notification. See Telephone encounter for: 08/15/19. Pt requesting call back from PCP/clinical staff

## 2019-08-18 NOTE — Telephone Encounter (Signed)
Tried to call pt LVM for pt to call office back

## 2019-08-18 NOTE — Telephone Encounter (Signed)
LVM for pt to call office back.

## 2019-08-22 ENCOUNTER — Encounter: Payer: Self-pay | Admitting: Emergency Medicine

## 2019-08-22 NOTE — Telephone Encounter (Signed)
Mychart message was sent to pt from provider about medication.

## 2019-08-22 NOTE — Telephone Encounter (Signed)
Recommend to increase dose of her medication.  She can start with 1-1/2 tablets daily.  Continue monitoring blood pressure.  She may need to take 2 tablets instead depending on her numbers.  The diuretic dose was recently reduced due to increased urination complaints.  How is that doing? Thanks.

## 2019-08-23 ENCOUNTER — Other Ambulatory Visit: Payer: Self-pay | Admitting: Gastroenterology

## 2019-08-23 DIAGNOSIS — R110 Nausea: Secondary | ICD-10-CM

## 2019-08-24 ENCOUNTER — Other Ambulatory Visit: Payer: Self-pay | Admitting: *Deleted

## 2019-08-24 DIAGNOSIS — R350 Frequency of micturition: Secondary | ICD-10-CM

## 2019-08-24 NOTE — Telephone Encounter (Signed)
Needs urology evaluation. Thanks.

## 2019-09-04 ENCOUNTER — Encounter: Payer: Self-pay | Admitting: Emergency Medicine

## 2019-09-04 ENCOUNTER — Telehealth (INDEPENDENT_AMBULATORY_CARE_PROVIDER_SITE_OTHER): Payer: BC Managed Care – PPO | Admitting: Emergency Medicine

## 2019-09-04 VITALS — Ht 65.5 in | Wt 184.0 lb

## 2019-09-04 DIAGNOSIS — N183 Chronic kidney disease, stage 3 unspecified: Secondary | ICD-10-CM

## 2019-09-04 DIAGNOSIS — I10 Essential (primary) hypertension: Secondary | ICD-10-CM | POA: Diagnosis not present

## 2019-09-04 DIAGNOSIS — K219 Gastro-esophageal reflux disease without esophagitis: Secondary | ICD-10-CM | POA: Diagnosis not present

## 2019-09-04 DIAGNOSIS — R110 Nausea: Secondary | ICD-10-CM | POA: Diagnosis not present

## 2019-09-04 NOTE — Progress Notes (Signed)
Called patient to triage for appointment. Patient is complaining of nausea since April of this year. Patient states the nausea is on and off, the last 2 days she felt nausea and it usually happens when she wakes up or after eating. The patient states she went to the Gastroenterology, Dr Collene Mares, the doctor said it was the gallbladder or kidneys.  Also, the patient is complaining of blood pressure problems, last Wednesday it was 130/80. Today the patient took her blood pressure after climbing the stairs to get the monitor, the reading was 157/108.

## 2019-09-05 NOTE — Progress Notes (Signed)
Telemedicine Encounter- SOAP NOTE Established Patient  This telephone encounter was conducted with the patient's (or proxy's) verbal consent via audio telecommunications: yes/no: Yes Patient was instructed to have this encounter in a suitably private space; and to only have persons present to whom they give permission to participate. In addition, patient identity was confirmed by use of name plus two identifiers (DOB and address).  I discussed the limitations, risks, security and privacy concerns of performing an evaluation and management service by telephone and the availability of in person appointments. I also discussed with the patient that there may be a patient responsible charge related to this service. The patient expressed understanding and agreed to proceed.  I spent a total of 25 minutes talking with the patient or their proxy.  No chief complaint on file. Nausea  Subjective   Heidi Herrera is a 53 y.o. female established patient. Telephone visit today complaining of persistent intermittent nausea since last April.  Has seen both kidney and GI doctors.  Kidney doctor states her nausea is not because of her kidneys.  GI doctor doing a work-up.  Schedule for abdominal ultrasound as well as upper and lower endoscopies scheduled next month.  States she had a "normal celiac test".  Patient still eating and drinking with no vomiting.  Denies abdominal pain, rectal bleeding, or melena.  Denies fever or flulike symptoms.   Requesting GI referral for second opinion.  No other significant symptoms or medical concerns  tHPI   Patient Active Problem List   Diagnosis Date Noted  . Body mass index (BMI) of 32.0-32.9 in adult 01/25/2018  . CKD (chronic kidney disease) stage 3, GFR 30-59 ml/min (HCC) 04/15/2017  . Fibroid 12/01/2016  . Fibroid, uterine   . Essential hypertension 09/29/2014  . Chronic anemia 02/02/2014  . VITAMIN D DEFICIENCY 12/18/2010  . HYPERLIPIDEMIA 12/18/2010  .  NEPHROLITHIASIS 03/24/2010  . 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 Gastroesophageal reflux disease without esophagitis 08/27/2009  . FIBROADENOSIS, BREAST 02/10/2007    Past Medical History:  Diagnosis Date  . Acid reflux   . Allergy   . Anemia   . Anxiety   . Fibroids   . GERD (gastroesophageal reflux disease)   . Hypertension   . Kidney cysts   . Migraine   . Obesity   . Sebaceous cyst   . Vertigo     Current Outpatient Medications  Medication Sig Dispense Refill  . Biotin 1000 MCG tablet Take 1,000 mcg by mouth daily.     . calcium-vitamin D (OSCAL WITH D) 500-200 MG-UNIT per tablet Take 1 tablet by mouth 2 (two) times daily. Calcium 1000 mg with Magnesium 400 mg and zinc 15 mg, taking 1 capsule 2 times daily    . cetirizine (ZYRTEC) 10 MG tablet Take 1 tablet (10 mg total) by mouth daily. 90 tablet 3  . Cyanocobalamin (VITAMIN B 12 PO) Take 1 tablet by mouth daily.    . famotidine (PEPCID) 20 MG tablet Take 1 tablet (20 mg total) by mouth 2 (two) times daily. 60 tablet 5  . IFEREX 150 150 MG capsule Take 1 capsule by mouth twice daily 60 capsule 0  . lisinopril-hydrochlorothiazide (ZESTORETIC) 20-12.5 MG tablet Take 1 tablet by mouth daily. 90 tablet 3  . Ascorbic Acid (VITAMIN C) 1000 MG tablet Take 2,000 mg by mouth 2 (two) times daily.     . butalbital-acetaminophen-caffeine (FIORICET, ESGIC) 50-325-40 MG tablet Take 1-2 tablets by mouth every 6 (six) hours as needed  for headache. (Patient not taking: Reported on 09/04/2019) 20 tablet 0  . co-enzyme Q-10 30 MG capsule Take 200 mg by mouth daily.    . iron polysaccharides (NIFEREX) 150 MG capsule Take 1 capsule (150 mg total) by mouth 2 (two) times daily. (Patient not taking: Reported on 05/11/2019) 60 capsule 3   No current facility-administered medications for this visit.     Allergies  Allergen Reactions  . Bystolic [Nebivolol Hcl] Anaphylaxis, Itching and Swelling  . Biaxin [Clarithromycin]     The category of the Biaxin  family.  Also allergic to water chestnuts.  . Erythromycin Base Rash    Social History   Socioeconomic History  . Marital status: Divorced    Spouse name: Not on file  . Number of children: 1  . Years of education: Not on file  . Highest education level: Master's degree (e.g., MA, MS, MEng, MEd, MSW, MBA)  Occupational History    Comment: [employer]  Social Needs  . Financial resource strain: Not on file  . Food insecurity    Worry: Not on file    Inability: Not on file  . Transportation needs    Medical: Not on file    Non-medical: Not on file  Tobacco Use  . Smoking status: Never Smoker  . Smokeless tobacco: Never Used  Substance and Sexual Activity  . Alcohol use: Not Currently    Alcohol/week: 1.0 standard drinks    Types: 1 Glasses of wine per week    Comment: quit years ago  . Drug use: No  . Sexual activity: Not on file  Lifestyle  . Physical activity    Days per week: Not on file    Minutes per session: Not on file  . Stress: Not on file  Relationships  . Social Herbalist on phone: Not on file    Gets together: Not on file    Attends religious service: Not on file    Active member of club or organization: Not on file    Attends meetings of clubs or organizations: Not on file    Relationship status: Not on file  . Intimate partner violence    Fear of current or ex partner: Not on file    Emotionally abused: Not on file    Physically abused: Not on file    Forced sexual activity: Not on file  Other Topics Concern  . Not on file  Social History Narrative   Lives alone   No caffiene    Review of Systems  Constitutional: Negative.  Negative for chills and fever.  HENT: Negative.  Negative for congestion and sore throat.   Respiratory: Negative.  Negative for cough and shortness of breath.   Cardiovascular: Negative.  Negative for chest pain and palpitations.  Gastrointestinal: Positive for nausea. Negative for abdominal pain, blood in stool,  diarrhea and vomiting.  Genitourinary: Negative.  Negative for dysuria and hematuria.  Musculoskeletal: Negative.  Negative for back pain, myalgias and neck pain.  Skin: Negative.  Negative for rash.  Neurological: Negative.  Negative for dizziness and headaches.  Endo/Heme/Allergies: Negative.   All other systems reviewed and are negative.   Objective  Alert and oriented x3 in no apparent respiratory distress. Vitals as reported by the patient: Today's Vitals   09/04/19 1730  Weight: 184 lb (83.5 kg)  Height: 5' 5.5" (1.664 m)    There are no diagnoses linked to this encounter.  Diagnoses and all orders for this  visit:  Chronic nausea -     Ambulatory referral to Gastroenterology  Essential hypertension  CKD (chronic kidney disease) stage 3, GFR 30-59 ml/min (HCC)  Gastroesophageal reflux disease without esophagitis    Clinically stable.  No red flag signs or symptoms.  Continue outpatient work-up. Follow-up in the office if clinical picture changes or condition gets worse.  I discussed the assessment and treatment plan with the patient. The patient was provided an opportunity to ask questions and all were answered. The patient agreed with the plan and demonstrated an understanding of the instructions.   The patient was advised to call back or seek an in-person evaluation if the symptoms worsen or if the condition fails to improve as anticipated.  I provided 25 minutes of non-face-to-face time during this encounter.  Horald Pollen, MD  Primary [facility] at [facility]

## 2019-09-08 ENCOUNTER — Encounter: Payer: Self-pay | Admitting: Physician Assistant

## 2019-09-11 ENCOUNTER — Encounter (HOSPITAL_COMMUNITY): Payer: BC Managed Care – PPO

## 2019-09-11 ENCOUNTER — Ambulatory Visit (HOSPITAL_COMMUNITY): Payer: BC Managed Care – PPO

## 2019-09-14 ENCOUNTER — Encounter (HOSPITAL_COMMUNITY): Payer: BC Managed Care – PPO

## 2019-09-19 ENCOUNTER — Ambulatory Visit: Payer: BC Managed Care – PPO | Admitting: Physician Assistant

## 2019-09-19 ENCOUNTER — Encounter: Payer: Self-pay | Admitting: Physician Assistant

## 2019-09-19 ENCOUNTER — Telehealth: Payer: Self-pay | Admitting: Physician Assistant

## 2019-09-19 VITALS — BP 124/70 | HR 78 | Temp 98.4°F | Ht 65.5 in | Wt 186.0 lb

## 2019-09-19 DIAGNOSIS — Z800 Family history of malignant neoplasm of digestive organs: Secondary | ICD-10-CM | POA: Diagnosis not present

## 2019-09-19 DIAGNOSIS — R110 Nausea: Secondary | ICD-10-CM

## 2019-09-19 MED ORDER — METOCLOPRAMIDE HCL 10 MG PO TBDP: ORAL_TABLET | ORAL | 0 refills | Status: DC

## 2019-09-19 MED ORDER — OMEPRAZOLE 40 MG PO CPDR: 40.0000 mg | DELAYED_RELEASE_CAPSULE | Freq: Every day | ORAL | 3 refills | Status: DC

## 2019-09-19 MED ORDER — SUPREP BOWEL PREP KIT 17.5-3.13-1.6 GM/177ML PO SOLN: 1.0000 | ORAL | 0 refills | Status: DC

## 2019-09-19 NOTE — Progress Notes (Signed)
Addendum: Reviewed and agree with assessment and management plan. Nayquan Evinger M, MD  

## 2019-09-19 NOTE — Telephone Encounter (Signed)
[facility] pharmacy called regarding metoclopramide prescription -- informed that ODT is $28 while regular non ODT is $4.  Requested prescription change.  If OK then fax back new prescription.  Call back # [phone]

## 2019-09-19 NOTE — Patient Instructions (Addendum)
You have been scheduled for an endoscopy and colonoscopy. Please follow the written instructions given to you at your visit today. Please pick up your prep supplies at the pharmacy within the next 1-3 days. If you use inhalers (even only as needed), please bring them with you on the day of your procedure. Your physician has requested that you go to www.startemmi.com and enter the access code given to you at your visit today. This web site gives a general overview about your procedure. However, you should still follow specific instructions given to you by our office regarding your preparation for the procedure.  Stop Famotidine  We have sent the following medications to your pharmacy for you to pick up at your convenience:  Thank you for entrusting me with your care and choosing [facility].  Ellouise Newer PA

## 2019-09-19 NOTE — Telephone Encounter (Signed)
Spoke to [facility],they will change the order to non ODT Reglan

## 2019-09-19 NOTE — Progress Notes (Signed)
Chief Complaint: Nausea  HPI:    Heidi Herrera is a 53 year old African-American female with a past medical history as listed below including CKD stage II who was referred to me by Heidi Herrera, * for a complaint of nausea.      Today, the patient presents to clinic and explains that she has been worked up by Heidi Herrera recently for this nausea and she said it was "either my kidneys or my gallbladder".  Patient had been scheduled for an EGD and colonoscopy, but was worried that she may not be able to complete this due to the nausea.  Describes that Heidi Herrera did a thyroid and celiac tests which were negative/normal and was planning on gallbladder ultrasound and procedures.  Patient tells me she is requesting a second opinion and would like someone to look at "the whole picture".  She is requesting Heidi Herrera as she has reviewed him online.  Tells me she has had many symptoms which have all started over the past year including vertigo, headaches and frequent urination as well as this nausea.  She is due to see a urologist next week.  Does think everything started after this "pandemic hit".  Does admit to being anxious and stressed.    Today describes nausea as "always in the background".  It can increase in intensity every couple of weeks and will last for a week and a half.  At that point, her mouth will get watery and she can hardly eat anything.  She also feels as though there is something constantly in her throat at those times and smelling or looking at food or lotions or really anything seems to make things worse.  She does not vomit.  This all started back in April.  Recently patient was started on Pepcid twice daily which she thinks does help some with the nausea.  Was on Zofran before which did not help at all.    Describes a family history of colon cancer in her sister who passed away at the age of 37.  After this patient changed to be on more of a plant-based diet, unsure if this is anything  to do with her symptoms.    Denies fever, chills, abdominal pain or symptoms that awaken her from sleep.  Past Medical History:  Diagnosis Date  . Acid reflux   . Allergy   . Anemia   . Anxiety   . Fibroids   . GERD (gastroesophageal reflux disease)   . Hypertension   . Kidney cysts   . Kidney failure    stage 2  . Migraine   . Obesity   . Sebaceous cyst   . Vertigo     Past Surgical History:  Procedure Laterality Date  . IR GENERIC HISTORICAL  12/01/2016   IR ANGIOGRAM PELVIS SELECTIVE OR SUPRASELECTIVE 12/01/2016 Heidi Mariscal, MD [facility]  . IR GENERIC HISTORICAL  12/01/2016   IR US GUIDE VASC ACCESS RIGHT 12/01/2016 Heidi Mariscal, MD [facility]  . IR GENERIC HISTORICAL  12/01/2016   IR EMBO ARTERIAL NOT HEMORR HEMANG INC GUIDE ROADMAPPING 12/01/2016 Heidi Mariscal, MD [facility]  . IR GENERIC HISTORICAL  12/01/2016   IR ANGIOGRAM SELECTIVE EACH ADDITIONAL VESSEL 12/01/2016 Heidi Mariscal, MD [facility]  . IR GENERIC HISTORICAL  12/01/2016   IR ANGIOGRAM SELECTIVE EACH ADDITIONAL VESSEL 12/01/2016 Heidi Mariscal, MD [facility]  . IR GENERIC HISTORICAL  12/01/2016   IR ANGIOGRAM PELVIS SELECTIVE OR SUPRASELECTIVE 12/01/2016 Heidi Mariscal, MD [facility]  . IR GENERIC HISTORICAL  01/07/2017   IR RADIOLOGIST EVAL & MGMT 01/07/2017 Heidi Mariscal, MD [facility]  . IR RADIOLOGIST EVAL & MGMT  08/31/2017  . LOBECTOMY  2009  . MYOMECTOMY  2007  . UTERINE FIBROID EMBOLIZATION     12/01/2016    Current Outpatient Medications  Medication Sig Dispense Refill  . Biotin 1000 MCG tablet Take 1,000 mcg by mouth daily.     . calcium-vitamin D (OSCAL WITH D) 500-200 MG-UNIT per tablet Take 1 tablet by mouth 2 (two) times daily. Calcium 1000 mg with Magnesium 400 mg and zinc 15 mg, taking 1 capsule 2 times daily    . cetirizine (ZYRTEC) 10 MG tablet Take 1 tablet (10 mg total) by mouth daily. 90 tablet 3  . Cyanocobalamin (VITAMIN B 12 PO) Take 1 tablet by mouth daily.    . IFEREX  150 150 MG capsule Take 1 capsule by mouth twice daily 60 capsule 0  . lisinopril-hydrochlorothiazide (ZESTORETIC) 20-25 MG tablet Take 1 tablet by mouth daily.    . Metoclopramide HCl 10 MG TBDP Take 1 tablet 20-30 minutes before first prep then take another tablet 20-30 minutes before second prep 2 tablet 0  . omeprazole (PRILOSEC) 40 MG capsule Take 1 capsule (40 mg total) by mouth daily before breakfast. 30 capsule 3  . SUPREP BOWEL PREP KIT 17.5-3.13-1.6 GM/177ML SOLN Take 1 kit by mouth as directed. 354 mL 0   No current facility-administered medications for this visit.     Allergies as of 09/19/2019 - Review Complete 09/19/2019  Allergen Reaction Noted  . Bystolic [nebivolol hcl] Anaphylaxis, Itching, and Swelling 10/24/2013  . Biaxin [clarithromycin]  04/20/2008  . Erythromycin base Rash 05/23/2012    Family History  Problem Relation Age of Onset  . Hypertension Mother   . Obesity Mother   . Stroke Mother   . Diabetes Mother   . Hypertension Sister   . Hyperlipidemia Sister   . Obesity Sister   . Diabetes Sister   . Heart disease Sister   . Mental illness Sister   . Glaucoma Sister   . Hypertension Brother   . Obesity Brother   . Stroke Brother   . Glaucoma Brother   . Mental illness Father   . Hypertension Father   . Cancer Brother   . Stroke Brother   . Diabetes Sister   . Hypertension Sister   . Mental illness Sister   . Glaucoma Maternal Uncle   . Cancer Sister        colon, dx in her late 53's    Social History   Socioeconomic History  . Marital status: Divorced    Spouse name: Not on file  . Number of children: 1  . Years of education: Not on file  . Highest education level: Master's degree (e.g., MA, MS, MEng, MEd, MSW, MBA)  Occupational History    Comment: [employer]  Social Needs  . Financial resource strain: Not on file  . Food insecurity    Worry: Not on file    Inability: Not on file  . Transportation needs    Medical: Not on file     Non-medical: Not on file  Tobacco Use  . Smoking status: Never Smoker  . Smokeless tobacco: Never Used  Substance and Sexual Activity  . Alcohol use: Not Currently    Alcohol/week: 1.0 standard drinks    Types: 1 Glasses of wine per week    Comment: quit  years ago  . Drug use: No  . Sexual activity: Not on file  Lifestyle  . Physical activity    Days per week: Not on file    Minutes per session: Not on file  . Stress: Not on file  Relationships  . Social Herbalist on phone: Not on file    Gets together: Not on file    Attends religious service: Not on file    Active member of club or organization: Not on file    Attends meetings of clubs or organizations: Not on file    Relationship status: Not on file  . Intimate partner violence    Fear of current or ex partner: Not on file    Emotionally abused: Not on file    Physically abused: Not on file    Forced sexual activity: Not on file  Other Topics Concern  . Not on file  Social History Narrative   Lives alone   No caffiene    Review of Systems:    Constitutional: No weight loss, fever or chills Skin: No rash  Cardiovascular: No chest pain  Respiratory: No SOB  Gastrointestinal: See HPI and otherwise negative Genitourinary: +increase urine frequency Neurological: +headache Musculoskeletal: No new muscle or joint pain Hematologic: No bleeding  Psychiatric:+ anxiety   Physical Exam:  Vital signs: BP 124/70   Pulse 78   Temp 98.4 F (36.9 C)   Ht 5' 5.5" (1.664 m)   Wt 186 lb (84.4 kg)   BMI 30.48 kg/m   Constitutional:   Pleasant overweight AA female appears to be in NAD, Well developed, Well nourished, alert and cooperative Head:  Normocephalic and atraumatic. Eyes:   PEERL, EOMI. No icterus. Conjunctiva pink. Ears:  Normal auditory acuity. Neck:  Supple Throat: Oral cavity and pharynx without inflammation, swelling or lesion.  Respiratory: Respirations even and unlabored. Lungs clear to  auscultation bilaterally.   No wheezes, crackles, or rhonchi.  Cardiovascular: Normal S1, S2. No MRG. Regular rate and rhythm. No peripheral edema, cyanosis or pallor.  Gastrointestinal:  Soft, nondistended, nontender. No rebound or guarding. Normal bowel sounds. No appreciable masses or hepatomegaly. Rectal:  Not performed.  Msk:  Symmetrical without gross deformities. Without edema, no deformity or joint abnormality.  Neurologic:  Alert and  oriented x4;  grossly normal neurologically.  Skin:   Dry and intact without significant lesions or rashes. Psychiatric: Demonstrates good judgement and reason without abnormal affect or behaviors.  Requesting recent labs and workup.  Assessment: 1.  Nausea: Constant, increases in intensity every couple of weeks, some better with Pepcid, thyroid testing and celiac testing normal per patient; consider functional gastritis (with stress/anxiety as discussed) versus gastritis+/-H. pylori versus gallbladder etiology 2.  Family history of colon cancer: In her sister at the age of 69, patient's last colonoscopy 10 years ago  Plan: 1.  Scheduled patient for a diagnostic EGD for nausea and a screening colonoscopy given family history of colon cancer and it has been 10 years since patient's last one.  Scheduled procedures with Heidi Herrera, per patient's request in the [facility].  Did discuss risks, benefits, limitations and alternatives and the patient agrees to proceed. 2.  Prescribed Metoclopramide 10 mg #2.  1 tablet p.o. q. 20-30 minutes prior to starting first half of prep and second tablet before second half of prep. 3.  Gave patient a low volume prep. 4.  Started the patient on Omeprazole 40 mg daily, 30-60 minutes before breakfast #30 with  1 refill. 5.  Discussed the patient can discontinue Pepcid for now and see how the Omeprazole works for her. 6.  Patient was given information in regards to transportation company as she has no one here that can drive her to the  procedures 7.  Requested work-up from Heidi Herrera. 8.  Briefly discussed with the patient that it could just be her stress and anxiety which has led to her constellation of symptoms. 9.  Patient to follow in clinic for recommendations from Heidi Herrera after time of procedures.  Heidi Newer, PA-C [facility] 09/19/2019, 10:10 AM  Cc: Heidi Herrera, *

## 2019-09-20 ENCOUNTER — Other Ambulatory Visit: Payer: Self-pay | Admitting: Emergency Medicine

## 2019-09-20 DIAGNOSIS — Z1231 Encounter for screening mammogram for malignant neoplasm of breast: Secondary | ICD-10-CM

## 2019-10-03 ENCOUNTER — Encounter: Payer: Self-pay | Admitting: Internal Medicine

## 2019-10-18 ENCOUNTER — Encounter: Payer: BC Managed Care – PPO | Admitting: Internal Medicine

## 2019-10-18 ENCOUNTER — Encounter: Payer: Self-pay | Admitting: Internal Medicine

## 2019-10-18 ENCOUNTER — Other Ambulatory Visit: Payer: Self-pay

## 2019-10-18 ENCOUNTER — Ambulatory Visit (AMBULATORY_SURGERY_CENTER): Payer: BC Managed Care – PPO | Admitting: Internal Medicine

## 2019-10-18 VITALS — BP 137/87 | HR 61 | Temp 98.5°F | Resp 18 | Ht 65.0 in | Wt 186.0 lb

## 2019-10-18 DIAGNOSIS — R110 Nausea: Secondary | ICD-10-CM

## 2019-10-18 DIAGNOSIS — D122 Benign neoplasm of ascending colon: Secondary | ICD-10-CM

## 2019-10-18 DIAGNOSIS — D123 Benign neoplasm of transverse colon: Secondary | ICD-10-CM

## 2019-10-18 DIAGNOSIS — Z800 Family history of malignant neoplasm of digestive organs: Secondary | ICD-10-CM

## 2019-10-18 DIAGNOSIS — K2970 Gastritis, unspecified, without bleeding: Secondary | ICD-10-CM

## 2019-10-18 DIAGNOSIS — K635 Polyp of colon: Secondary | ICD-10-CM

## 2019-10-18 DIAGNOSIS — B9681 Helicobacter pylori [H. pylori] as the cause of diseases classified elsewhere: Secondary | ICD-10-CM | POA: Diagnosis not present

## 2019-10-18 DIAGNOSIS — Z1211 Encounter for screening for malignant neoplasm of colon: Secondary | ICD-10-CM

## 2019-10-18 DIAGNOSIS — K2950 Unspecified chronic gastritis without bleeding: Secondary | ICD-10-CM

## 2019-10-18 MED ORDER — SODIUM CHLORIDE 0.9 % IV SOLN: 500.0000 mL | Freq: Once | INTRAVENOUS | Status: DC

## 2019-10-18 NOTE — Op Note (Signed)
[facility] Patient Name: Heidi Herrera Procedure Date: 10/18/2019 1:22 PM MRN: [id number] Endoscopist: Jerene Bears , MD Age: 53 Referring MD:  Date of Birth: 13-May-1966 Gender: Female Account #: [id number] Procedure:                Upper GI endoscopy Indications:              Nausea Medicines:                Monitored Anesthesia Care Procedure:                Pre-Anesthesia Assessment:                           - Prior to the procedure, a History and Physical                            was performed, and patient medications and                            allergies were reviewed. The patient's tolerance of                            previous anesthesia was also reviewed. The risks                            and benefits of the procedure and the sedation                            options and risks were discussed with the patient.                            All questions were answered, and informed consent                            was obtained. Prior Anticoagulants: The patient has                            taken no previous anticoagulant or antiplatelet                            agents. ASA Grade Assessment: II - A patient with                            mild systemic disease. After reviewing the risks                            and benefits, the patient was deemed in                            satisfactory condition to undergo the procedure.                           After obtaining informed consent, the endoscope was  passed under direct vision. Throughout the                            procedure, the patient's blood pressure, pulse, and                            oxygen saturations were monitored continuously. The                            Endoscope was introduced through the mouth, and                            advanced to the second part of duodenum. The upper                            GI endoscopy was accomplished without difficulty.                             The patient tolerated the procedure well. Scope In: Scope Out: Findings:                 The examined esophagus was normal.                           Diffuse moderate inflammation characterized by                            erythema was found in the gastric fundus and in the                            gastric body. Biopsies were taken with a cold                            forceps for histology and Helicobacter pylori                            testing.                           The examined duodenum was normal. Complications:            No immediate complications. Estimated Blood Loss:     Estimated blood loss was minimal. Impression:               - Normal esophagus.                           - Gastritis. Biopsied.                           - Normal examined duodenum. Recommendation:           - Patient has a contact number available for                            emergencies. The signs and symptoms of potential  delayed complications were discussed with the                            patient. Return to normal activities tomorrow.                            Written discharge instructions were provided to the                            patient.                           - Resume previous diet.                           - Continue present medications.                           - Await pathology results. Jerene Bears, MD 10/18/2019 2:11:37 PM This report has been signed electronically.

## 2019-10-18 NOTE — Progress Notes (Signed)
Temp & covid questions by JB-front desk  Vs by KA - Admitting

## 2019-10-18 NOTE — Progress Notes (Signed)
To PACU, VSS. Report to [email or web address] 

## 2019-10-18 NOTE — Op Note (Signed)
[place] Patient Name: Heidi Herrera Procedure Date: 10/18/2019 1:23 PM MRN: [id number] Endoscopist: Jerene Bears , MD Age: 53 Referring MD:  Date of Birth: February 11, 1966 Gender: Female Account #: [id number] Procedure:                Colonoscopy Indications:              Screening in patient at increased risk: Family                            history of 1st-degree relative with colorectal                            cancer before age 65 years, Last colonoscopy 10                            years ago Medicines:                Monitored Anesthesia Care Procedure:                Pre-Anesthesia Assessment:                           - Prior to the procedure, a History and Physical                            was performed, and patient medications and                            allergies were reviewed. The patient's tolerance of                            previous anesthesia was also reviewed. The risks                            and benefits of the procedure and the sedation                            options and risks were discussed with the patient.                            All questions were answered, and informed consent                            was obtained. Prior Anticoagulants: The patient has                            taken no previous anticoagulant or antiplatelet                            agents. ASA Grade Assessment: II - A patient with                            mild systemic disease. After reviewing the risks  and benefits, the patient was deemed in                            satisfactory condition to undergo the procedure.                           - Prior to the procedure, a History and Physical                            was performed, and patient medications and                            allergies were reviewed. The patient's tolerance of                            previous anesthesia was also reviewed. The risks            and benefits of the procedure and the sedation                            options and risks were discussed with the patient.                            All questions were answered, and informed consent                            was obtained. Prior Anticoagulants: The patient has                            taken no previous anticoagulant or antiplatelet                            agents. ASA Grade Assessment: II - A patient with                            mild systemic disease. After reviewing the risks                            and benefits, the patient was deemed in                            satisfactory condition to undergo the procedure.                           After obtaining informed consent, the colonoscope                            was passed under direct vision. Throughout the                            procedure, the patient's blood pressure, pulse, and                            oxygen saturations were monitored continuously.  The                            Colonoscope was introduced through the anus and                            advanced to the cecum, identified by appendiceal                            orifice and ileocecal valve. The colonoscopy was                            performed without difficulty. The patient tolerated                            the procedure well. The quality of the bowel                            preparation was good. The ileocecal valve,                            appendiceal orifice, and rectum were photographed. Scope In: 1:53:16 PM Scope Out: 2:08:38 PM Scope Withdrawal Time: 0 hours 12 minutes 34 seconds  Total Procedure Duration: 0 hours 15 minutes 22 seconds  Findings:                 The digital rectal exam was normal.                           A 3 mm polyp was found in the ascending colon. The                            polyp was sessile. The polyp was removed with a                            cold snare. Resection and  retrieval were complete.                           A 3 mm polyp was found in the transverse colon. The                            polyp was sessile. The polyp was removed with a                            cold snare. Resection and retrieval were complete.                           The exam was otherwise without abnormality on                            direct and retroflexion views. Complications:            No immediate complications. Estimated Blood Loss:     Estimated blood loss: none. Impression:               -  One 3 mm polyp in the ascending colon, removed                            with a cold snare. Resected and retrieved.                           - One 3 mm polyp in the transverse colon, removed                            with a cold snare. Resected and retrieved.                           - The examination was otherwise normal on direct                            and retroflexion views. Recommendation:           - Patient has a contact number available for                            emergencies. The signs and symptoms of potential                            delayed complications were discussed with the                            patient. Return to normal activities tomorrow.                            Written discharge instructions were provided to the                            patient.                           - Resume previous diet.                           - Continue present medications.                           - Await pathology results.                           - Repeat colonoscopy is recommended for                            surveillance. The colonoscopy date will be                            determined after pathology results from today's                            exam become available for review. Jerene Bears, MD 10/18/2019 2:14:27 PM This report has been signed electronically.

## 2019-10-18 NOTE — Patient Instructions (Signed)
YOU HAD AN ENDOSCOPIC PROCEDURE TODAY AT THE [facility]:   Refer to the procedure report that was given to you for any specific questions about what was found during the examination.  If the procedure report does not answer your questions, please call your gastroenterologist to clarify.  If you requested that your care partner not be given the details of your procedure findings, then the procedure report has been included in a sealed envelope for you to review at your convenience later.  YOU SHOULD EXPECT: Some feelings of bloating in the abdomen. Passage of more gas than usual.  Walking can help get rid of the air that was put into your GI tract during the procedure and reduce the bloating. If you had a lower endoscopy (such as a colonoscopy or flexible sigmoidoscopy) you may notice spotting of blood in your stool or on the toilet paper. If you underwent a bowel prep for your procedure, you may not have a normal bowel movement for a few days.  Please Note:  You might notice some irritation and congestion in your nose or some drainage.  This is from the oxygen used during your procedure.  There is no need for concern and it should clear up in a day or so.  SYMPTOMS TO REPORT IMMEDIATELY:   Following lower endoscopy (colonoscopy or flexible sigmoidoscopy):  Excessive amounts of blood in the stool  Significant tenderness or worsening of abdominal pains  Swelling of the abdomen that is new, acute  Fever of 100F or higher   Following upper endoscopy (EGD)  Vomiting of blood or coffee ground material  New chest pain or pain under the shoulder blades  Painful or persistently difficult swallowing  New shortness of breath  Fever of 100F or higher  Black, tarry-looking stools  For urgent or emergent issues, a gastroenterologist can be reached at any hour by calling [phone].   DIET:  We do recommend a small meal at first, but then you may proceed to your regular diet.  Drink  plenty of fluids but you should avoid alcoholic beverages for 24 hours.  MEDICATIONS: Continue present medications.  Please see handouts given to you by your recovery nurse.  ACTIVITY:  You should plan to take it easy for the rest of today and you should NOT DRIVE or use heavy machinery until tomorrow (because of the sedation medicines used during the test).    FOLLOW UP: Our staff will call the number listed on your records 48-72 hours following your procedure to check on you and address any questions or concerns that you may have regarding the information given to you following your procedure. If we do not reach you, we will leave a message.  We will attempt to reach you two times.  During this call, we will ask if you have developed any symptoms of COVID 19. If you develop any symptoms (ie: fever, flu-like symptoms, shortness of breath, cough etc.) before then, please call [phone].  If you test positive for Covid 19 in the 2 weeks post procedure, please call and report this information to us.    If any biopsies were taken you will be contacted by phone or by letter within the next 1-3 weeks.  Please call us at [phone] if you have not heard about the biopsies in 3 weeks.   Thank you for allowing us to provide for your healthcare needs today.   SIGNATURES/CONFIDENTIALITY: You and/or your care partner have signed paperwork which will   be entered into your electronic medical record.  These signatures attest to the fact that that the information above on your After Visit Summary has been reviewed and is understood.  Full responsibility of the confidentiality of this discharge information lies with you and/or your care-partner. 

## 2019-10-18 NOTE — Progress Notes (Signed)
Called to room to assist during endoscopic procedure.  Patient ID and intended procedure confirmed with present staff. Received instructions for my participation in the procedure from the performing physician.  

## 2019-10-20 ENCOUNTER — Telehealth: Payer: Self-pay

## 2019-10-20 ENCOUNTER — Telehealth: Payer: Self-pay | Admitting: *Deleted

## 2019-10-20 NOTE — Telephone Encounter (Signed)
  Follow up Call-  Call back number 10/18/2019  Post procedure Call Back phone  # [phone]  Permission to leave phone message Yes  Some recent data might be hidden     No ID on voicemail; no message left

## 2019-10-20 NOTE — Telephone Encounter (Signed)
  Follow up Call-  Call back number 10/18/2019  Post procedure Call Back phone  # [phone]  Permission to leave phone message Yes  Some recent data might be hidden     1. Patient questions:Have you developed a fever since your procedure? no  2.   Have you had an respiratory symptoms (SOB or cough) since your procedure? no  3.   Have you tested positive for COVID 19 since your procedure no 4.   Have you had any family members/close contacts diagnosed with the COVID 19 since your procedure?  no   If yes to any of these questions please route to Joylene John, RN and Alphonsa Gin, Therapist, sports.   Do you have a fever, pain , or abdominal swelling? No. Pain Score  0 *  Have you tolerated food without any problems? Yes.    Have you been able to return to your normal activities? Yes.    Do you have any questions about your discharge instructions: Diet   No. Medications  No. Follow up visit  No.  Do you have questions or concerns about your Care? No.  Actions: * If pain score is 4 or above: No action needed, pain <4.

## 2019-10-23 ENCOUNTER — Encounter: Payer: Self-pay | Admitting: Internal Medicine

## 2019-10-24 ENCOUNTER — Other Ambulatory Visit: Payer: Self-pay | Admitting: Emergency Medicine

## 2019-10-24 ENCOUNTER — Other Ambulatory Visit: Payer: Self-pay

## 2019-10-24 DIAGNOSIS — B9681 Helicobacter pylori [H. pylori] as the cause of diseases classified elsewhere: Secondary | ICD-10-CM

## 2019-10-24 MED ORDER — BIS SUBCIT-METRONID-TETRACYC 140-125-125 MG PO CAPS: 3.0000 | ORAL_CAPSULE | Freq: Three times a day (TID) | ORAL | 0 refills | Status: DC

## 2019-10-24 MED ORDER — OMEPRAZOLE 40 MG PO CPDR: 40.0000 mg | DELAYED_RELEASE_CAPSULE | Freq: Two times a day (BID) | ORAL | 0 refills | Status: DC

## 2019-10-24 NOTE — Telephone Encounter (Signed)
Requested medication (s) are due for refill today: yes  Requested medication (s) are on the active medication list: yes  Last refill:  06/28/2019  Future visit scheduled: no  Notes to clinic:  Last filled by historical provider  Review for refill   Requested Prescriptions  Pending Prescriptions Disp Refills   lisinopril-hydrochlorothiazide (ZESTORETIC) 20-25 MG tablet [Pharmacy Med Name: Lisinopril-hydroCHLOROthiazide 20-25 MG Oral Tablet] 90 tablet 0    Sig: Take 1 tablet by mouth once daily     Cardiovascular:  ACEI + Diuretic Combos Failed - 10/24/2019 10:41 AM      Failed - Na in normal range and within 180 days    Sodium  Date Value Ref Range Status  01/25/2018 141 134 - 144 mmol/L Final         Failed - K in normal range and within 180 days    Potassium  Date Value Ref Range Status  01/25/2018 4.3 3.5 - 5.2 mmol/L Final         Failed - Cr in normal range and within 180 days    Creat  Date Value Ref Range Status  04/04/2016 1.17 (H) 0.50 - 1.05 mg/dL Final   Creatinine, Ser  Date Value Ref Range Status  01/25/2018 1.26 (H) 0.57 - 1.00 mg/dL Final         Failed - Ca in normal range and within 180 days    Calcium  Date Value Ref Range Status  01/25/2018 9.6 8.7 - 10.2 mg/dL Final         Passed - Patient is not pregnant      Passed - Last BP in normal range    BP Readings from Last 1 Encounters:  10/18/19 137/87         Passed - Valid encounter within last 6 months    Recent Outpatient Visits          1 month ago Chronic nausea   [facility] at [facility], Ines Bloomer, MD   2 months ago Essential hypertension   Primary [facility] at [facility], Girard, MD   6 months ago Gastroesophageal reflux disease without esophagitis   Primary Care at [facility], Rex Kras, MD   9 months ago Essential hypertension   [facility] at [facility], Ines Bloomer, MD   1 year ago Intractable headache, unspecified chronicity pattern, unspecified headache  type   Primary Care at [facility], Ines Bloomer, MD

## 2019-10-24 NOTE — Telephone Encounter (Signed)
Patient need an appt before any further refills

## 2019-10-25 ENCOUNTER — Telehealth: Payer: Self-pay | Admitting: Internal Medicine

## 2019-10-26 NOTE — Telephone Encounter (Signed)
Please see note below, pt has hx of CKD. Please advise.

## 2019-10-26 NOTE — Telephone Encounter (Signed)
Spoke with pt and she is aware and states she has left a message with that office also and is waiting to hear back.

## 2019-10-26 NOTE — Telephone Encounter (Signed)
I would prefer to ask her nephrologist, Dr. Carolin Sicks.  I will copy him on this result note and then we can let her know. Until we hear, she should not start the antibiotics.  Dr. Carolin Sicks, Mrs. Riehle has H Pylori and I had intended to treat her with metronidazole, bismuth and tetracycline for 14 days.  Will this be okay with her renal function? Thanks  Zenovia Jarred,  [facility]

## 2019-11-02 ENCOUNTER — Telehealth: Payer: Self-pay

## 2019-11-02 NOTE — Telephone Encounter (Signed)
-----   Message from Jerene Bears, MD sent at 11/02/2019  8:44 AM EST ----- Please let patient know that her nephrologist says that the Pylera for her H. pylori infection is okay in light of her kidney function.  Please copy this exchange to the chart Thanks JMP ----- Message ----- From: Rosita Fire, MD Sent: 11/01/2019   2:36 PM EST To: Jerene Bears, MD  Flagyl, bismuth and tetracycline is ok for her. GFR is around 45-50 %

## 2019-11-02 NOTE — Telephone Encounter (Signed)
Spoke with pt and she is aware.

## 2019-11-15 ENCOUNTER — Ambulatory Visit: Payer: BC Managed Care – PPO

## 2020-01-03 ENCOUNTER — Ambulatory Visit: Payer: BC Managed Care – PPO

## 2020-01-19 ENCOUNTER — Encounter: Payer: Self-pay | Admitting: Internal Medicine

## 2020-01-19 ENCOUNTER — Other Ambulatory Visit: Payer: BC Managed Care – PPO

## 2020-01-19 NOTE — Telephone Encounter (Signed)
OPENED IN ERROR

## 2020-01-25 ENCOUNTER — Other Ambulatory Visit: Payer: BC Managed Care – PPO

## 2020-01-25 DIAGNOSIS — B9681 Helicobacter pylori [H. pylori] as the cause of diseases classified elsewhere: Secondary | ICD-10-CM

## 2020-01-25 DIAGNOSIS — K2970 Gastritis, unspecified, without bleeding: Secondary | ICD-10-CM

## 2020-01-26 LAB — HELICOBACTER PYLORI  SPECIAL ANTIGEN
MICRO NUMBER:: 10142371
SPECIMEN QUALITY: ADEQUATE

## 2020-01-31 ENCOUNTER — Ambulatory Visit (INDEPENDENT_AMBULATORY_CARE_PROVIDER_SITE_OTHER): Payer: BC Managed Care – PPO

## 2020-01-31 ENCOUNTER — Encounter: Payer: Self-pay | Admitting: Emergency Medicine

## 2020-01-31 ENCOUNTER — Ambulatory Visit: Payer: BC Managed Care – PPO | Admitting: Emergency Medicine

## 2020-01-31 ENCOUNTER — Other Ambulatory Visit: Payer: Self-pay

## 2020-01-31 VITALS — BP 138/85 | HR 72 | Temp 97.9°F | Ht 65.0 in | Wt 186.0 lb

## 2020-01-31 DIAGNOSIS — M79601 Pain in right arm: Secondary | ICD-10-CM

## 2020-01-31 DIAGNOSIS — I10 Essential (primary) hypertension: Secondary | ICD-10-CM

## 2020-01-31 DIAGNOSIS — Q613 Polycystic kidney, unspecified: Secondary | ICD-10-CM | POA: Diagnosis not present

## 2020-01-31 DIAGNOSIS — N1831 Chronic kidney disease, stage 3a: Secondary | ICD-10-CM

## 2020-01-31 MED ORDER — HYDROCHLOROTHIAZIDE 12.5 MG PO TABS: 12.5000 mg | ORAL_TABLET | Freq: Every day | ORAL | 3 refills | Status: DC

## 2020-01-31 MED ORDER — LISINOPRIL 20 MG PO TABS: 20.0000 mg | ORAL_TABLET | Freq: Every day | ORAL | 3 refills | Status: DC

## 2020-01-31 MED ORDER — MULTIVITAMIN WOMENS 50+ ADV PO TABS: 1.0000 | ORAL_TABLET | Freq: Every day | ORAL | 3 refills | Status: AC

## 2020-01-31 NOTE — Patient Instructions (Addendum)
Will change blood pressure medications as follows: Stop combo pill of lisinopril-hydrochlorothiazide thiazide 20/25. Take separate medications: Lisinopril 20 mg and lower dose hydrochlorothiazide 12.5 mg in the morning.  Must monitor blood pressure readings daily at home and adjust doses as discussed. Maximum dose of lisinopril 40 mg daily and hydrochlorothiazide 25 mg daily.   Hypertension, Adult High blood pressure (hypertension) is when the force of blood pumping through the arteries is too strong. The arteries are the blood vessels that carry blood from the heart throughout the body. Hypertension forces the heart to work harder to pump blood and may cause arteries to become narrow or stiff. Untreated or uncontrolled hypertension can cause a heart attack, heart failure, a stroke, kidney disease, and other problems. A blood pressure reading consists of a higher number over a lower number. Ideally, your blood pressure should be below 120/80. The first ("top") number is called the systolic pressure. It is a measure of the pressure in your arteries as your heart beats. The second ("bottom") number is called the diastolic pressure. It is a measure of the pressure in your arteries as the heart relaxes. What are the causes? The exact cause of this condition is not known. There are some conditions that result in or are related to high blood pressure. What increases the risk? Some risk factors for high blood pressure are under your control. The following factors may make you more likely to develop this condition:  Smoking.  Having type 2 diabetes mellitus, high cholesterol, or both.  Not getting enough exercise or physical activity.  Being overweight.  Having too much fat, sugar, calories, or salt (sodium) in your diet.  Drinking too much alcohol. Some risk factors for high blood pressure may be difficult or impossible to change. Some of these factors include:  Having chronic kidney  disease.  Having a family history of high blood pressure.  Age. Risk increases with age.  Race. You may be at higher risk if you are African American.  Gender. Men are at higher risk than women before age 68. After age 23, women are at higher risk than men.  Having obstructive sleep apnea.  Stress. What are the signs or symptoms? High blood pressure may not cause symptoms. Very high blood pressure (hypertensive crisis) may cause:  Headache.  Anxiety.  Shortness of breath.  Nosebleed.  Nausea and vomiting.  Vision changes.  Severe chest pain.  Seizures. How is this diagnosed? This condition is diagnosed by measuring your blood pressure while you are seated, with your arm resting on a flat surface, your legs uncrossed, and your feet flat on the floor. The cuff of the blood pressure monitor will be placed directly against the skin of your upper arm at the level of your heart. It should be measured at least twice using the same arm. Certain conditions can cause a difference in blood pressure between your right and left arms. Certain factors can cause blood pressure readings to be lower or higher than normal for a short period of time:  When your blood pressure is higher when you are in a health care provider's office than when you are at home, this is called white coat hypertension. Most people with this condition do not need medicines.  When your blood pressure is higher at home than when you are in a health care provider's office, this is called masked hypertension. Most people with this condition may need medicines to control blood pressure. If you have a high blood  pressure reading during one visit or you have normal blood pressure with other risk factors, you may be asked to:  Return on a different day to have your blood pressure checked again.  Monitor your blood pressure at home for 1 week or longer. If you are diagnosed with hypertension, you may have other blood or  imaging tests to help your health care provider understand your overall risk for other conditions. How is this treated? This condition is treated by making healthy lifestyle changes, such as eating healthy foods, exercising more, and reducing your alcohol intake. Your health care provider may prescribe medicine if lifestyle changes are not enough to get your blood pressure under control, and if:  Your systolic blood pressure is above 130.  Your diastolic blood pressure is above 80. Your personal target blood pressure may vary depending on your medical conditions, your age, and other factors. Follow these instructions at home: Eating and drinking   Eat a diet that is high in fiber and potassium, and low in sodium, added sugar, and fat. An example eating plan is called the DASH (Dietary Approaches to Stop Hypertension) diet. To eat this way: ? Eat plenty of fresh fruits and vegetables. Try to fill one half of your plate at each meal with fruits and vegetables. ? Eat whole grains, such as whole-wheat pasta, brown rice, or whole-grain bread. Fill about one fourth of your plate with whole grains. ? Eat or drink low-fat dairy products, such as skim milk or low-fat yogurt. ? Avoid fatty cuts of meat, processed or cured meats, and poultry with skin. Fill about one fourth of your plate with lean proteins, such as fish, chicken without skin, beans, eggs, or tofu. ? Avoid pre-made and processed foods. These tend to be higher in sodium, added sugar, and fat.  Reduce your daily sodium intake. Most people with hypertension should eat less than 1,500 mg of sodium a day.  Do not drink alcohol if: ? Your health care provider tells you not to drink. ? You are pregnant, may be pregnant, or are planning to become pregnant.  If you drink alcohol: ? Limit how much you use to:  0-1 drink a day for women.  0-2 drinks a day for men. ? Be aware of how much alcohol is in your drink. In the [place] one drink equals  one 12 oz bottle of beer (355 mL), one 5 oz glass of wine (148 mL), or one 1 oz glass of hard liquor (44 mL). Lifestyle   Work with your health care provider to maintain a healthy body weight or to lose weight. Ask what an ideal weight is for you.  Get at least 30 minutes of exercise most days of the week. Activities may include walking, swimming, or biking.  Include exercise to strengthen your muscles (resistance exercise), such as Pilates or lifting weights, as part of your weekly exercise routine. Try to do these types of exercises for 30 minutes at least 3 days a week.  Do not use any products that contain nicotine or tobacco, such as cigarettes, e-cigarettes, and chewing tobacco. If you need help quitting, ask your health care provider.  Monitor your blood pressure at home as told by your health care provider.  Keep all follow-up visits as told by your health care provider. This is important. Medicines  Take over-the-counter and prescription medicines only as told by your health care provider. Follow directions carefully. Blood pressure medicines must be taken as prescribed.  Do not  skip doses of blood pressure medicine. Doing this puts you at risk for problems and can make the medicine less effective.  Ask your health care provider about side effects or reactions to medicines that you should watch for. Contact a health care provider if you:  Think you are having a reaction to a medicine you are taking.  Have headaches that keep coming back (recurring).  Feel dizzy.  Have swelling in your ankles.  Have trouble with your vision. Get help right away if you:  Develop a severe headache or confusion.  Have unusual weakness or numbness.  Feel faint.  Have severe pain in your chest or abdomen.  Vomit repeatedly.  Have trouble breathing. Summary  Hypertension is when the force of blood pumping through your arteries is too strong. If this condition is not controlled, it  may put you at risk for serious complications.  Your personal target blood pressure may vary depending on your medical conditions, your age, and other factors. For most people, a normal blood pressure is less than 120/80.  Hypertension is treated with lifestyle changes, medicines, or a combination of both. Lifestyle changes include losing weight, eating a healthy, low-sodium diet, exercising more, and limiting alcohol. This information is not intended to replace advice given to you by your health care provider. Make sure you discuss any questions you have with your health care provider. Document Revised: 08/10/2018 Document Reviewed: 08/10/2018 Elsevier Patient Education  [employer].     If you have lab work done today you will be contacted with your lab results within the next 2 weeks.  If you have not heard from [place] then please contact us. The fastest way to get your results is to register for My Chart.   IF you received an x-ray today, you will receive an invoice from [facility]. Please contact [facility] at [phone] with questions or concerns regarding your invoice.   IF you received labwork today, you will receive an invoice from [facility]. Please contact [facility] at [phone] with questions or concerns regarding your invoice.   Our billing staff will not be able to assist you with questions regarding bills from these companies.  You will be contacted with the lab results as soon as they are available. The fastest way to get your results is to activate your My Chart account. Instructions are located on the last page of this paperwork. If you have not heard from [place] regarding the results in 2 weeks, please contact this office.

## 2020-01-31 NOTE — Progress Notes (Signed)
Heidi Herrera 54 y.o.   Chief Complaint  Patient presents with  . R arm pain    x 4 months, describes as tingling , radiation down the arm , difficult to sleep on that side  . discuss BP medication    urinates 3 to 4 times nightly    HISTORY OF PRESENT ILLNESS: This is a 54 y.o. female complaining of right humerus pain for the past 4 months.  Sharp localized pain with occasional distal radiation.  Denies injury.  Denies shoulder or elbow pain.  Denies neck pain.  Denies distal neurological symptoms. Also has a history of hypertension and would like to decrease diuretic dose to avoid nighttime excessive diuresis.  HPI   Prior to Admission medications   Medication Sig Start Date End Date Taking? Authorizing Provider  Biotin 1000 MCG tablet Take 1,000 mcg by mouth daily.    Yes [provider]  cetirizine (ZYRTEC) 10 MG tablet Take 1 tablet (10 mg total) by mouth daily. 12/21/18  Yes Elius Etheredge, Ines Bloomer, MD  Cyanocobalamin (VITAMIN B 12 PO) Take 1 tablet by mouth daily.   Yes [provider]  IFEREX 150 150 MG capsule Take 1 capsule by mouth twice daily 07/05/19  Yes Gabryela Kimbrell, Ines Bloomer, MD  lisinopril-hydrochlorothiazide (ZESTORETIC) 20-25 MG tablet Take 1 tablet by mouth once daily 10/24/19  Yes Monay Houlton, Ines Bloomer, MD  omeprazole (PRILOSEC) 40 MG capsule Take 1 capsule (40 mg total) by mouth 2 (two) times daily. 10/24/19  Yes Pyrtle, Lajuan Lines, MD  bismuth-metronidazole-tetracycline [facility]) [phone] MG capsule Take 3 capsules by mouth 4 (four) times daily -  before meals and at bedtime. Patient not taking: Reported on 01/31/2020 10/24/19   Pyrtle, Lajuan Lines, MD  calcium-vitamin D (OSCAL WITH D) 500-200 MG-UNIT per tablet Take 1 tablet by mouth 2 (two) times daily. Calcium 1000 mg with Magnesium 400 mg and zinc 15 mg, taking 1 capsule 2 times daily    [provider]    Allergies  Allergen Reactions  . Bystolic [Nebivolol Hcl] Anaphylaxis, Itching and  Swelling  . Biaxin [Clarithromycin]     The category of the Biaxin family.  Also allergic to water chestnuts.  . Erythromycin Base Rash    Patient Active Problem List   Diagnosis Date Noted  . Body mass index (BMI) of 32.0-32.9 in adult 01/25/2018  . CKD (chronic kidney disease) stage 3, GFR 30-59 ml/min (HCC) 04/15/2017  . Fibroid 12/01/2016  . Fibroid, uterine   . Essential hypertension 09/29/2014  . Chronic anemia 02/02/2014  . VITAMIN D DEFICIENCY 12/18/2010  . HYPERLIPIDEMIA 12/18/2010  . NEPHROLITHIASIS 03/24/2010  . 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 Gastroesophageal reflux disease without esophagitis 08/27/2009  . FIBROADENOSIS, BREAST 02/10/2007    Past Medical History:  Diagnosis Date  . Acid reflux   . Allergy   . Anemia   . Anxiety   . Fibroids   . GERD (gastroesophageal reflux disease)   . Hypertension   . Kidney cysts   . Kidney failure    stage 2  . Migraine   . Obesity   . Sebaceous cyst   . Vertigo     Past Surgical History:  Procedure Laterality Date  . IR GENERIC HISTORICAL  12/01/2016   IR ANGIOGRAM PELVIS SELECTIVE OR SUPRASELECTIVE 12/01/2016 Sandi Mariscal, MD [facility]  . IR GENERIC HISTORICAL  12/01/2016   IR US GUIDE VASC ACCESS RIGHT 12/01/2016 Sandi Mariscal, MD [facility]  . IR GENERIC HISTORICAL  12/01/2016  IR EMBO ARTERIAL NOT HEMORR HEMANG INC GUIDE ROADMAPPING 12/01/2016 Sandi Mariscal, MD [facility]  . IR GENERIC HISTORICAL  12/01/2016   IR ANGIOGRAM SELECTIVE EACH ADDITIONAL VESSEL 12/01/2016 Sandi Mariscal, MD [facility]  . IR GENERIC HISTORICAL  12/01/2016   IR ANGIOGRAM SELECTIVE EACH ADDITIONAL VESSEL 12/01/2016 Sandi Mariscal, MD [facility]  . IR GENERIC HISTORICAL  12/01/2016   IR ANGIOGRAM PELVIS SELECTIVE OR SUPRASELECTIVE 12/01/2016 Sandi Mariscal, MD [facility]  . IR GENERIC HISTORICAL  01/07/2017   IR RADIOLOGIST EVAL & MGMT 01/07/2017 Sandi Mariscal, MD [facility]  . IR RADIOLOGIST EVAL & MGMT  08/31/2017  . LOBECTOMY   2009  . MYOMECTOMY  2007  . UTERINE FIBROID EMBOLIZATION     12/01/2016    Social History   Socioeconomic History  . Marital status: Divorced    Spouse name: Not on file  . Number of children: 1  . Years of education: Not on file  . Highest education level: Master's degree (e.g., MA, MS, MEng, MEd, MSW, MBA)  Occupational History    Comment: [employer]  Tobacco Use  . Smoking status: Never Smoker  . Smokeless tobacco: Never Used  Substance and Sexual Activity  . Alcohol use: Not Currently    Alcohol/week: 1.0 standard drinks    Types: 1 Glasses of wine per week    Comment: quit years ago  . Drug use: No  . Sexual activity: Not on file  Other Topics Concern  . Not on file  Social History Narrative   Lives alone   No caffiene   Social Determinants of Health   Financial Resource Strain:   . Difficulty of Paying Living Expenses: Not on file  Food Insecurity:   . Worried About Charity fundraiser in the Last Year: Not on file  . Ran Out of Food in the Last Year: Not on file  Transportation Needs:   . Lack of Transportation (Medical): Not on file  . Lack of Transportation (Non-Medical): Not on file  Physical Activity:   . Days of Exercise per Week: Not on file  . Minutes of Exercise per Session: Not on file  Stress:   . Feeling of Stress : Not on file  Social Connections:   . Frequency of Communication with Friends and Family: Not on file  . Frequency of Social Gatherings with Friends and Family: Not on file  . Attends Religious Services: Not on file  . Active Member of Clubs or Organizations: Not on file  . Attends Archivist Meetings: Not on file  . Marital Status: Not on file  Intimate Partner Violence:   . Fear of Current or Ex-Partner: Not on file  . Emotionally Abused: Not on file  . Physically Abused: Not on file  . Sexually Abused: Not on file    Family History  Problem Relation Age of Onset  . Hypertension Mother   . Obesity Mother   . Stroke  Mother   . Diabetes Mother   . Hypertension Sister   . Hyperlipidemia Sister   . Obesity Sister   . Diabetes Sister   . Heart disease Sister   . Mental illness Sister   . Glaucoma Sister   . Hypertension Brother   . Obesity Brother   . Stroke Brother   . Glaucoma Brother   . Mental illness Father   . Hypertension Father   . Cancer Brother   . Stroke Brother   . Hypertension Sister   .  Mental illness Sister   . Glaucoma Maternal Uncle   . Cancer Sister        colon, dx in her late 77's  . Diabetes Sister      Review of Systems  Constitutional: Negative.  Negative for chills and fever.  HENT: Negative.  Negative for congestion and sore throat.   Respiratory: Negative.  Negative for cough and shortness of breath.   Cardiovascular: Negative.  Negative for chest pain and palpitations.  Gastrointestinal: Negative.  Negative for abdominal pain, blood in stool, diarrhea, melena, nausea and vomiting.  Genitourinary: Positive for frequency. Negative for dysuria and hematuria.  Musculoskeletal: Negative for neck pain.       Chronic right upper arm pain  Skin: Negative.  Negative for rash.  Neurological: Negative.  Negative for dizziness, sensory change, focal weakness, seizures, loss of consciousness and headaches.  All other systems reviewed and are negative.   Vitals:   01/31/20 1424  BP: 138/85  Pulse: 72  Temp: 97.9 F (36.6 C)  SpO2: 98%    Physical Exam Vitals reviewed.  Constitutional:      Appearance: Normal appearance.  HENT:     Head: Normocephalic.  Eyes:     Extraocular Movements: Extraocular movements intact.     Pupils: Pupils are equal, round, and reactive to light.  Cardiovascular:     Rate and Rhythm: Normal rate and regular rhythm.     Pulses: Normal pulses.     Heart sounds: Normal heart sounds.  Pulmonary:     Effort: Pulmonary effort is normal.     Breath sounds: Normal breath sounds.  Musculoskeletal:     Cervical back: Normal, normal range  of motion and neck supple. No swelling, erythema, tenderness or bony tenderness. No pain with movement. Normal range of motion.     Thoracic back: Normal.     Comments: Right upper extremity: No erythema or bruising.  Mild tenderness to deep palpation of proximal outer arm.  No shoulder pain with full range of motion.  No elbow pain with full range of motion.  Right elbow within normal limits.  Right hand within normal limits.  Good distal circulation.  Neurovascularly intact.  Lymphadenopathy:     Cervical: No cervical adenopathy.  Skin:    General: Skin is warm and dry.     Capillary Refill: Capillary refill takes less than 2 seconds.  Neurological:     General: No focal deficit present.     Mental Status: She is alert and oriented to person, place, and time.  Psychiatric:        Mood and Affect: Mood normal.        Behavior: Behavior normal.    DG Humerus Right  Result Date: 01/31/2020 CLINICAL DATA:  54 year old with RIGHT upper arm pain of unspecified duration. EXAM: RIGHT HUMERUS - 2+ VIEW COMPARISON:  None. FINDINGS: No acute fracture involving the humerus. No intrinsic osseous abnormality. Visualized shoulder joint and elbow joint anatomically aligned without degenerative changes. IMPRESSION: Normal examination. Electronically Signed   By: Evangeline Dakin M.D.   On: 01/31/2020 15:15   A total of 30 minutes was spent with the patient, greater than 50% of which was in counseling/coordination of care regarding chronic medical problems, differential diagnosis of right upper arm pain, review of x-ray, review of most recent office visit notes, review of most recent blood work, review of most recent specialists office visit notes, hypertension and medication changes including doses and frequencies, prognosis and need for follow-up.  ASSESSMENT & PLAN: Heidi Herrera was seen today for r arm pain and discuss bp medication.  Diagnoses and all orders for this visit:  Essential hypertension -      lisinopril (ZESTRIL) 20 MG tablet; Take 1 tablet (20 mg total) by mouth daily. -     hydrochlorothiazide (HYDRODIURIL) 12.5 MG tablet; Take 1 tablet (12.5 mg total) by mouth daily.  Stage 3a chronic kidney disease  Polycystic kidney disease  Right arm pain Comments: Musculoskeletal pain Orders: -     DG Humerus Right; Future  Other orders -     Multiple Vitamins-Minerals (MULTIVITAMIN WOMENS 50+ ADV) TABS; Take 1 tablet by mouth daily.    Patient Instructions   Will change blood pressure medications as follows: Stop combo pill of lisinopril-hydrochlorothiazide thiazide 20/25. Take separate medications: Lisinopril 20 mg and lower dose hydrochlorothiazide 12.5 mg in the morning.  Must monitor blood pressure readings daily at home and adjust doses as discussed. Maximum dose of lisinopril 40 mg daily and hydrochlorothiazide 25 mg daily.   Hypertension, Adult High blood pressure (hypertension) is when the force of blood pumping through the arteries is too strong. The arteries are the blood vessels that carry blood from the heart throughout the body. Hypertension forces the heart to work harder to pump blood and may cause arteries to become narrow or stiff. Untreated or uncontrolled hypertension can cause a heart attack, heart failure, a stroke, kidney disease, and other problems. A blood pressure reading consists of a higher number over a lower number. Ideally, your blood pressure should be below 120/80. The first ("top") number is called the systolic pressure. It is a measure of the pressure in your arteries as your heart beats. The second ("bottom") number is called the diastolic pressure. It is a measure of the pressure in your arteries as the heart relaxes. What are the causes? The exact cause of this condition is not known. There are some conditions that result in or are related to high blood pressure. What increases the risk? Some risk factors for high blood pressure are under your  control. The following factors may make you more likely to develop this condition:  Smoking.  Having type 2 diabetes mellitus, high cholesterol, or both.  Not getting enough exercise or physical activity.  Being overweight.  Having too much fat, sugar, calories, or salt (sodium) in your diet.  Drinking too much alcohol. Some risk factors for high blood pressure may be difficult or impossible to change. Some of these factors include:  Having chronic kidney disease.  Having a family history of high blood pressure.  Age. Risk increases with age.  Race. You may be at higher risk if you are African American.  Gender. Men are at higher risk than women before age 81. After age 63, women are at higher risk than men.  Having obstructive sleep apnea.  Stress. What are the signs or symptoms? High blood pressure may not cause symptoms. Very high blood pressure (hypertensive crisis) may cause:  Headache.  Anxiety.  Shortness of breath.  Nosebleed.  Nausea and vomiting.  Vision changes.  Severe chest pain.  Seizures. How is this diagnosed? This condition is diagnosed by measuring your blood pressure while you are seated, with your arm resting on a flat surface, your legs uncrossed, and your feet flat on the floor. The cuff of the blood pressure monitor will be placed directly against the skin of your upper arm at the level of your heart. It should be measured at  least twice using the same arm. Certain conditions can cause a difference in blood pressure between your right and left arms. Certain factors can cause blood pressure readings to be lower or higher than normal for a short period of time:  When your blood pressure is higher when you are in a health care provider's office than when you are at home, this is called white coat hypertension. Most people with this condition do not need medicines.  When your blood pressure is higher at home than when you are in a health care  provider's office, this is called masked hypertension. Most people with this condition may need medicines to control blood pressure. If you have a high blood pressure reading during one visit or you have normal blood pressure with other risk factors, you may be asked to:  Return on a different day to have your blood pressure checked again.  Monitor your blood pressure at home for 1 week or longer. If you are diagnosed with hypertension, you may have other blood or imaging tests to help your health care provider understand your overall risk for other conditions. How is this treated? This condition is treated by making healthy lifestyle changes, such as eating healthy foods, exercising more, and reducing your alcohol intake. Your health care provider may prescribe medicine if lifestyle changes are not enough to get your blood pressure under control, and if:  Your systolic blood pressure is above 130.  Your diastolic blood pressure is above 80. Your personal target blood pressure may vary depending on your medical conditions, your age, and other factors. Follow these instructions at home: Eating and drinking   Eat a diet that is high in fiber and potassium, and low in sodium, added sugar, and fat. An example eating plan is called the DASH (Dietary Approaches to Stop Hypertension) diet. To eat this way: ? Eat plenty of fresh fruits and vegetables. Try to fill one half of your plate at each meal with fruits and vegetables. ? Eat whole grains, such as whole-wheat pasta, brown rice, or whole-grain bread. Fill about one fourth of your plate with whole grains. ? Eat or drink low-fat dairy products, such as skim milk or low-fat yogurt. ? Avoid fatty cuts of meat, processed or cured meats, and poultry with skin. Fill about one fourth of your plate with lean proteins, such as fish, chicken without skin, beans, eggs, or tofu. ? Avoid pre-made and processed foods. These tend to be higher in sodium, added  sugar, and fat.  Reduce your daily sodium intake. Most people with hypertension should eat less than 1,500 mg of sodium a day.  Do not drink alcohol if: ? Your health care provider tells you not to drink. ? You are pregnant, may be pregnant, or are planning to become pregnant.  If you drink alcohol: ? Limit how much you use to:  0-1 drink a day for women.  0-2 drinks a day for men. ? Be aware of how much alcohol is in your drink. In the [place] one drink equals one 12 oz bottle of beer (355 mL), one 5 oz glass of wine (148 mL), or one 1 oz glass of hard liquor (44 mL). Lifestyle   Work with your health care provider to maintain a healthy body weight or to lose weight. Ask what an ideal weight is for you.  Get at least 30 minutes of exercise most days of the week. Activities may include walking, swimming, or biking.  Include exercise to  strengthen your muscles (resistance exercise), such as Pilates or lifting weights, as part of your weekly exercise routine. Try to do these types of exercises for 30 minutes at least 3 days a week.  Do not use any products that contain nicotine or tobacco, such as cigarettes, e-cigarettes, and chewing tobacco. If you need help quitting, ask your health care provider.  Monitor your blood pressure at home as told by your health care provider.  Keep all follow-up visits as told by your health care provider. This is important. Medicines  Take over-the-counter and prescription medicines only as told by your health care provider. Follow directions carefully. Blood pressure medicines must be taken as prescribed.  Do not skip doses of blood pressure medicine. Doing this puts you at risk for problems and can make the medicine less effective.  Ask your health care provider about side effects or reactions to medicines that you should watch for. Contact a health care provider if you:  Think you are having a reaction to a medicine you are taking.  Have  headaches that keep coming back (recurring).  Feel dizzy.  Have swelling in your ankles.  Have trouble with your vision. Get help right away if you:  Develop a severe headache or confusion.  Have unusual weakness or numbness.  Feel faint.  Have severe pain in your chest or abdomen.  Vomit repeatedly.  Have trouble breathing. Summary  Hypertension is when the force of blood pumping through your arteries is too strong. If this condition is not controlled, it may put you at risk for serious complications.  Your personal target blood pressure may vary depending on your medical conditions, your age, and other factors. For most people, a normal blood pressure is less than 120/80.  Hypertension is treated with lifestyle changes, medicines, or a combination of both. Lifestyle changes include losing weight, eating a healthy, low-sodium diet, exercising more, and limiting alcohol. This information is not intended to replace advice given to you by your health care provider. Make sure you discuss any questions you have with your health care provider. Document Revised: 08/10/2018 Document Reviewed: 08/10/2018 Elsevier Patient Education  [employer].     If you have lab work done today you will be contacted with your lab results within the next 2 weeks.  If you have not heard from [place] then please contact us. The fastest way to get your results is to register for My Chart.   IF you received an x-ray today, you will receive an invoice from [facility]. Please contact [facility] at [phone] with questions or concerns regarding your invoice.   IF you received labwork today, you will receive an invoice from [facility]. Please contact [facility] at [phone] with questions or concerns regarding your invoice.   Our billing staff will not be able to assist you with questions regarding bills from these companies.  You will be contacted with the lab results as soon as  they are available. The fastest way to get your results is to activate your My Chart account. Instructions are located on the last page of this paperwork. If you have not heard from [place] regarding the results in 2 weeks, please contact this office.          Agustina Caroli, MD [facility]

## 2020-02-07 ENCOUNTER — Ambulatory Visit
Admission: RE | Admit: 2020-02-07 | Discharge: 2020-02-07 | Disposition: A | Discharge status: Home / Self Care | Payer: BC Managed Care – PPO | Source: Ambulatory Visit | Attending: Emergency Medicine | Admitting: Emergency Medicine

## 2020-02-07 ENCOUNTER — Telehealth: Payer: Self-pay | Admitting: *Deleted

## 2020-02-07 ENCOUNTER — Telehealth: Payer: Self-pay | Admitting: Emergency Medicine

## 2020-02-07 ENCOUNTER — Other Ambulatory Visit: Payer: Self-pay

## 2020-02-07 ENCOUNTER — Encounter: Payer: Self-pay | Admitting: Emergency Medicine

## 2020-02-07 DIAGNOSIS — Z1231 Encounter for screening mammogram for malignant neoplasm of breast: Secondary | ICD-10-CM

## 2020-02-07 NOTE — Telephone Encounter (Signed)
PATIENT DROPPED OFF LETTERS OF NECESSITY TO BE COMPLETED BY PROVIDER /PLACED IN CMS/PROVIDER BOX AT NURSES STATION

## 2020-02-07 NOTE — Telephone Encounter (Signed)
Called patient's mobile number left voice mail the two (2) forms medical necessity ready for pick up at front desk.

## 2020-02-08 ENCOUNTER — Ambulatory Visit: Payer: Self-pay | Admitting: *Deleted

## 2020-02-08 ENCOUNTER — Other Ambulatory Visit: Payer: Self-pay

## 2020-02-08 ENCOUNTER — Ambulatory Visit: Payer: BC Managed Care – PPO | Admitting: Emergency Medicine

## 2020-02-08 ENCOUNTER — Encounter: Payer: Self-pay | Admitting: Emergency Medicine

## 2020-02-08 VITALS — BP 148/86 | HR 76 | Temp 97.9°F | Resp 14 | Ht 65.0 in | Wt 189.0 lb

## 2020-02-08 DIAGNOSIS — R7303 Prediabetes: Secondary | ICD-10-CM

## 2020-02-08 DIAGNOSIS — N1831 Chronic kidney disease, stage 3a: Secondary | ICD-10-CM | POA: Diagnosis not present

## 2020-02-08 DIAGNOSIS — Q613 Polycystic kidney, unspecified: Secondary | ICD-10-CM | POA: Diagnosis not present

## 2020-02-08 DIAGNOSIS — N189 Chronic kidney disease, unspecified: Secondary | ICD-10-CM

## 2020-02-08 DIAGNOSIS — I10 Essential (primary) hypertension: Secondary | ICD-10-CM

## 2020-02-08 DIAGNOSIS — Z862 Personal history of diseases of the blood and blood-forming organs and certain disorders involving the immune mechanism: Secondary | ICD-10-CM

## 2020-02-08 MED ORDER — AMLODIPINE BESYLATE 5 MG PO TABS: 5.0000 mg | ORAL_TABLET | Freq: Every day | ORAL | 3 refills | Status: DC

## 2020-02-08 NOTE — Telephone Encounter (Signed)
Pt called stating her BP is elevated: 2/24 148/106 at 0744 2/25 152/103 at 0900         163//106 at 0924 She takes hctz and lisinopril daily; and she was told that she could adjust her lisinopril as long as she does not go up to 40 mg; the pt says that she has worked up to the max dose of lisinopril 40 mg because her BP has been going up; she is also having "swelling all over, and she feels tight",she noticed the swelling on 02/06/20; ;recommendations made per nurse protocol; she verbalized understanding; the pt sees Dr Mitchel Honour, [place]; she can be contacted at [phone]; will route to office for scheduling.  Reason for Disposition . Systolic BP  >= 99991111 OR Diastolic >= [id number]  Answer Assessment - Initial Assessment Questions 1. BLOOD PRESSURE: "What is the blood pressure?" "Did you take at least two measurements 5 minutes apart?"     152/103 and 163/106 2. ONSET: "When did you take your blood pressure?"  02/08/20 at 0900 and 0924 3. HOW: "How did you obtain the blood pressure?" (e.g., visiting nurse, automatic home BP monitor)     Home cuff taken left wrist 4. HISTORY: "Do you have a history of high blood pressure?"     Yes  5. MEDICATIONS: "Are you taking any medications for blood pressure?" "Have you missed any doses recently?"     No missed doses of meds 6. OTHER SYMPTOMS: "Do you have any symptoms?" (e.g., headache, chest pain, blurred vision, difficulty breathing, weakness)     no 7. PREGNANCY: "Is there any chance you are pregnant?" "When was your last menstrual period?"     No LMP 11/17/2019  Protocols used: HIGH BLOOD PRESSURE-A-AH

## 2020-02-08 NOTE — Assessment & Plan Note (Signed)
Uncontrolled hypertension.  Continue lisinopril 40 mg daily and hydrochlorothiazide 12.5 mg daily.  We will add amlodipine 5 mg daily. Follow-up in 3 months.

## 2020-02-08 NOTE — Progress Notes (Signed)
BP Readings from Last 3 Encounters:  02/08/20 (!) 167/91  01/31/20 138/85  10/18/19 137/87   Heidi Herrera 54 y.o.   Chief Complaint  Patient presents with  . Hypertension    pt here to discuss BP, pt states she has a dull headache, no other symptoms, she has been using a wrist cuff reading 152/103 163/106 165/109 noting that the pts cuff has just read 172/117 when our machine read 167/91    HISTORY OF PRESENT ILLNESS: This is a 54 y.o. female with history of hypertension here for follow-up.  Blood pressure readings at home over the past several weeks have been high.  Presently taking lisinopril 40 mg and hydrochlorothiazide 12.5 mg daily. Patient has a history of chronic kidney disease, seen by nephrologist in September 2019.  Office visit note reviewed.  Also has a history of polycystic kidney disease.  Recent blood work done last January shows EGFR of 62. Concerned about high blood pressure readings at home.  Asymptomatic.  HPI   Prior to Admission medications   Medication Sig Start Date End Date Taking? Authorizing Provider  Biotin 1000 MCG tablet Take 1,000 mcg by mouth daily.    Yes [provider]  bismuth-metronidazole-tetracycline (PYLERA) [phone] MG capsule Take 3 capsules by mouth 4 (four) times daily -  before meals and at bedtime. 10/24/19  Yes Pyrtle, Lajuan Lines, MD  calcium-vitamin D (OSCAL WITH D) 500-200 MG-UNIT per tablet Take 1 tablet by mouth 2 (two) times daily. Calcium 1000 mg with Magnesium 400 mg and zinc 15 mg, taking 1 capsule 2 times daily   Yes [provider]  cetirizine (ZYRTEC) 10 MG tablet Take 1 tablet (10 mg total) by mouth daily. 12/21/18  Yes Cabot Cromartie, Ines Bloomer, MD  Cyanocobalamin (VITAMIN B 12 PO) Take 1 tablet by mouth daily.   Yes [provider]  hydrochlorothiazide (HYDRODIURIL) 12.5 MG tablet Take 1 tablet (12.5 mg total) by mouth daily. 01/31/20  Yes Horald Pollen, MD  IFEREX 150 150 MG capsule Take 1  capsule by mouth twice daily 07/05/19  Yes Leticia Coletta, Ines Bloomer, MD  lisinopril (ZESTRIL) 20 MG tablet Take 1 tablet (20 mg total) by mouth daily. 01/31/20  Yes Jemma Rasp, Ines Bloomer, MD  Multiple Vitamins-Minerals (MULTIVITAMIN WOMENS 50+ ADV) TABS Take 1 tablet by mouth daily. 01/31/20 04/30/20 Yes Alexxia Stankiewicz, Ines Bloomer, MD  omeprazole (PRILOSEC) 40 MG capsule Take 1 capsule (40 mg total) by mouth 2 (two) times daily. 10/24/19  Yes Pyrtle, Lajuan Lines, MD    Allergies  Allergen Reactions  . Bystolic [Nebivolol Hcl] Anaphylaxis, Itching and Swelling  . Biaxin [Clarithromycin]     The category of the Biaxin family.  Also allergic to water chestnuts.  . Erythromycin Base Rash    Patient Active Problem List   Diagnosis Date Noted  . Body mass index (BMI) of 32.0-32.9 in adult 01/25/2018  . CKD (chronic kidney disease) stage 3, GFR 30-59 ml/min (HCC) 04/15/2017  . Fibroid 12/01/2016  . Fibroid, uterine   . Essential hypertension 09/29/2014  . Chronic anemia 02/02/2014  . VITAMIN D DEFICIENCY 12/18/2010  . HYPERLIPIDEMIA 12/18/2010  . NEPHROLITHIASIS 03/24/2010  . 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 Gastroesophageal reflux disease without esophagitis 08/27/2009  . FIBROADENOSIS, BREAST 02/10/2007    Past Medical History:  Diagnosis Date  . Acid reflux   . Allergy   . Anemia   . Anxiety   . Fibroids   . GERD (gastroesophageal reflux disease)   . Hypertension   .  Kidney cysts   . Kidney failure    stage 2  . Migraine   . Obesity   . Sebaceous cyst   . Vertigo     Past Surgical History:  Procedure Laterality Date  . IR GENERIC HISTORICAL  12/01/2016   IR ANGIOGRAM PELVIS SELECTIVE OR SUPRASELECTIVE 12/01/2016 Sandi Mariscal, MD [facility]  . IR GENERIC HISTORICAL  12/01/2016   IR US GUIDE VASC ACCESS RIGHT 12/01/2016 Sandi Mariscal, MD [facility]  . IR GENERIC HISTORICAL  12/01/2016   IR EMBO ARTERIAL NOT HEMORR HEMANG INC GUIDE ROADMAPPING 12/01/2016 Sandi Mariscal, MD [facility]  .  IR GENERIC HISTORICAL  12/01/2016   IR ANGIOGRAM SELECTIVE EACH ADDITIONAL VESSEL 12/01/2016 Sandi Mariscal, MD [facility]  . IR GENERIC HISTORICAL  12/01/2016   IR ANGIOGRAM SELECTIVE EACH ADDITIONAL VESSEL 12/01/2016 Sandi Mariscal, MD [facility]  . IR GENERIC HISTORICAL  12/01/2016   IR ANGIOGRAM PELVIS SELECTIVE OR SUPRASELECTIVE 12/01/2016 Sandi Mariscal, MD [facility]  . IR GENERIC HISTORICAL  01/07/2017   IR RADIOLOGIST EVAL & MGMT 01/07/2017 Sandi Mariscal, MD [facility]  . IR RADIOLOGIST EVAL & MGMT  08/31/2017  . LOBECTOMY  2009  . MYOMECTOMY  2007  . UTERINE FIBROID EMBOLIZATION     12/01/2016    Social History   Socioeconomic History  . Marital status: Divorced    Spouse name: Not on file  . Number of children: 1  . Years of education: Not on file  . Highest education level: Master's degree (e.g., MA, MS, MEng, MEd, MSW, MBA)  Occupational History    Comment: [employer]  Tobacco Use  . Smoking status: Never Smoker  . Smokeless tobacco: Never Used  Substance and Sexual Activity  . Alcohol use: Not Currently    Alcohol/week: 1.0 standard drinks    Types: 1 Glasses of wine per week    Comment: quit years ago  . Drug use: No  . Sexual activity: Not on file  Other Topics Concern  . Not on file  Social History Narrative   Lives alone   No caffiene   Social Determinants of Health   Financial Resource Strain:   . Difficulty of Paying Living Expenses: Not on file  Food Insecurity:   . Worried About Charity fundraiser in the Last Year: Not on file  . Ran Out of Food in the Last Year: Not on file  Transportation Needs:   . Lack of Transportation (Medical): Not on file  . Lack of Transportation (Non-Medical): Not on file  Physical Activity:   . Days of Exercise per Week: Not on file  . Minutes of Exercise per Session: Not on file  Stress:   . Feeling of Stress : Not on file  Social Connections:   . Frequency of Communication with Friends and Family: Not on file  .  Frequency of Social Gatherings with Friends and Family: Not on file  . Attends Religious Services: Not on file  . Active Member of Clubs or Organizations: Not on file  . Attends Archivist Meetings: Not on file  . Marital Status: Not on file  Intimate Partner Violence:   . Fear of Current or Ex-Partner: Not on file  . Emotionally Abused: Not on file  . Physically Abused: Not on file  . Sexually Abused: Not on file    Family History  Problem Relation Age of Onset  . Hypertension Mother   . Obesity Mother   . Stroke Mother   .  Diabetes Mother   . Hypertension Sister   . Hyperlipidemia Sister   . Obesity Sister   . Diabetes Sister   . Heart disease Sister   . Mental illness Sister   . Glaucoma Sister   . Hypertension Brother   . Obesity Brother   . Stroke Brother   . Glaucoma Brother   . Mental illness Father   . Hypertension Father   . Cancer Brother   . Stroke Brother   . Hypertension Sister   . Mental illness Sister   . Glaucoma Maternal Uncle   . Cancer Sister        colon, dx in her late 8's  . Diabetes Sister      Review of Systems  Constitutional: Negative.  Negative for chills and fever.  HENT: Negative.  Negative for congestion and sore throat.   Respiratory: Negative.  Negative for cough and shortness of breath.   Cardiovascular: Negative.  Negative for chest pain and palpitations.  Gastrointestinal: Negative.  Negative for abdominal pain, diarrhea, nausea and vomiting.  Genitourinary: Negative.  Negative for dysuria and hematuria.  Musculoskeletal: Negative.  Negative for myalgias and neck pain.  Skin: Negative.  Negative for rash.  Neurological: Negative.  Negative for dizziness and headaches.  All other systems reviewed and are negative.  Today's Vitals   02/08/20 1412 02/08/20 1416  BP: (!) 167/91 (!) 148/86  Pulse: 76   Resp: 14   Temp: 97.9 F (36.6 C)   TempSrc: Temporal   SpO2: 99%   Weight: 189 lb (85.7 kg)   Height: '5\' 5"'$   (1.651 m)    Body mass index is 31.45 kg/m.   Physical Exam Vitals reviewed.  Constitutional:      Appearance: Normal appearance.  HENT:     Head: Normocephalic.  Eyes:     Extraocular Movements: Extraocular movements intact.     Pupils: Pupils are equal, round, and reactive to light.  Cardiovascular:     Rate and Rhythm: Normal rate and regular rhythm.     Pulses: Normal pulses.     Heart sounds: Normal heart sounds.  Pulmonary:     Effort: Pulmonary effort is normal.     Breath sounds: Normal breath sounds.  Abdominal:     General: There is no distension.     Palpations: Abdomen is soft.     Tenderness: There is no abdominal tenderness.  Musculoskeletal:        General: Normal range of motion.     Cervical back: Normal range of motion and neck supple.  Skin:    General: Skin is warm and dry.     Capillary Refill: Capillary refill takes less than 2 seconds.  Neurological:     General: No focal deficit present.     Mental Status: She is alert and oriented to person, place, and time.  Psychiatric:        Mood and Affect: Mood normal.        Behavior: Behavior normal.     A total of 30 minutes was spent with the patient, greater than 50% of which was in counseling/coordination of care regarding uncontrolled hypertension, treatment including diet nutrition and medications, medication changes including new medication amlodipine, review of most recent office visit notes, review of most recent blood work results, prognosis and need for follow-up in 3 months..  ASSESSMENT & PLAN: Essential hypertension Uncontrolled hypertension.  Continue lisinopril 40 mg daily and hydrochlorothiazide 12.5 mg daily.  We will add amlodipine 5  mg daily. Follow-up in 3 months.  Aliesha was seen today for hypertension.  Diagnoses and all orders for this visit:  Essential hypertension -     Comprehensive metabolic panel -     Lipid panel -     amLODipine (NORVASC) 5 MG tablet; Take 1 tablet  (5 mg total) by mouth daily.  Stage 3a chronic kidney disease -     CBC with Differential/Platelet  Polycystic kidney disease  Prediabetes -     Hemoglobin A1c  History of anemia due to chronic kidney disease -     CBC with Differential/Platelet    Patient Instructions       If you have lab work done today you will be contacted with your lab results within the next 2 weeks.  If you have not heard from [place] then please contact us. The fastest way to get your results is to register for My Chart.   IF you received an x-ray today, you will receive an invoice from [facility]. Please contact [facility] at [phone] with questions or concerns regarding your invoice.   IF you received labwork today, you will receive an invoice from [place]. Please contact [facility] at [phone] with questions or concerns regarding your invoice.   Our billing staff will not be able to assist you with questions regarding bills from these companies.  You will be contacted with the lab results as soon as they are available. The fastest way to get your results is to activate your My Chart account. Instructions are located on the last page of this paperwork. If you have not heard from [place] regarding the results in 2 weeks, please contact this office.     Hypertension, Adult High blood pressure (hypertension) is when the force of blood pumping through the arteries is too strong. The arteries are the blood vessels that carry blood from the heart throughout the body. Hypertension forces the heart to work harder to pump blood and may cause arteries to become narrow or stiff. Untreated or uncontrolled hypertension can cause a heart attack, heart failure, a stroke, kidney disease, and other problems. A blood pressure reading consists of a higher number over a lower number. Ideally, your blood pressure should be below 120/80. The first ("top") number is called the systolic pressure. It is a  measure of the pressure in your arteries as your heart beats. The second ("bottom") number is called the diastolic pressure. It is a measure of the pressure in your arteries as the heart relaxes. What are the causes? The exact cause of this condition is not known. There are some conditions that result in or are related to high blood pressure. What increases the risk? Some risk factors for high blood pressure are under your control. The following factors may make you more likely to develop this condition:  Smoking.  Having type 2 diabetes mellitus, high cholesterol, or both.  Not getting enough exercise or physical activity.  Being overweight.  Having too much fat, sugar, calories, or salt (sodium) in your diet.  Drinking too much alcohol. Some risk factors for high blood pressure may be difficult or impossible to change. Some of these factors include:  Having chronic kidney disease.  Having a family history of high blood pressure.  Age. Risk increases with age.  Race. You may be at higher risk if you are African American.  Gender. Men are at higher risk than women before age 57. After age 49, women are at higher risk than  men.  Having obstructive sleep apnea.  Stress. What are the signs or symptoms? High blood pressure may not cause symptoms. Very high blood pressure (hypertensive crisis) may cause:  Headache.  Anxiety.  Shortness of breath.  Nosebleed.  Nausea and vomiting.  Vision changes.  Severe chest pain.  Seizures. How is this diagnosed? This condition is diagnosed by measuring your blood pressure while you are seated, with your arm resting on a flat surface, your legs uncrossed, and your feet flat on the floor. The cuff of the blood pressure monitor will be placed directly against the skin of your upper arm at the level of your heart. It should be measured at least twice using the same arm. Certain conditions can cause a difference in blood pressure between  your right and left arms. Certain factors can cause blood pressure readings to be lower or higher than normal for a short period of time:  When your blood pressure is higher when you are in a health care provider's office than when you are at home, this is called white coat hypertension. Most people with this condition do not need medicines.  When your blood pressure is higher at home than when you are in a health care provider's office, this is called masked hypertension. Most people with this condition may need medicines to control blood pressure. If you have a high blood pressure reading during one visit or you have normal blood pressure with other risk factors, you may be asked to:  Return on a different day to have your blood pressure checked again.  Monitor your blood pressure at home for 1 week or longer. If you are diagnosed with hypertension, you may have other blood or imaging tests to help your health care provider understand your overall risk for other conditions. How is this treated? This condition is treated by making healthy lifestyle changes, such as eating healthy foods, exercising more, and reducing your alcohol intake. Your health care provider may prescribe medicine if lifestyle changes are not enough to get your blood pressure under control, and if:  Your systolic blood pressure is above 130.  Your diastolic blood pressure is above 80. Your personal target blood pressure may vary depending on your medical conditions, your age, and other factors. Follow these instructions at home: Eating and drinking   Eat a diet that is high in fiber and potassium, and low in sodium, added sugar, and fat. An example eating plan is called the DASH (Dietary Approaches to Stop Hypertension) diet. To eat this way: ? Eat plenty of fresh fruits and vegetables. Try to fill one half of your plate at each meal with fruits and vegetables. ? Eat whole grains, such as whole-wheat pasta, brown rice,  or whole-grain bread. Fill about one fourth of your plate with whole grains. ? Eat or drink low-fat dairy products, such as skim milk or low-fat yogurt. ? Avoid fatty cuts of meat, processed or cured meats, and poultry with skin. Fill about one fourth of your plate with lean proteins, such as fish, chicken without skin, beans, eggs, or tofu. ? Avoid pre-made and processed foods. These tend to be higher in sodium, added sugar, and fat.  Reduce your daily sodium intake. Most people with hypertension should eat less than 1,500 mg of sodium a day.  Do not drink alcohol if: ? Your health care provider tells you not to drink. ? You are pregnant, may be pregnant, or are planning to become pregnant.  If you drink  alcohol: ? Limit how much you use to:  0-1 drink a day for women.  0-2 drinks a day for men. ? Be aware of how much alcohol is in your drink. In the [place] one drink equals one 12 oz bottle of beer (355 mL), one 5 oz glass of wine (148 mL), or one 1 oz glass of hard liquor (44 mL). Lifestyle   Work with your health care provider to maintain a healthy body weight or to lose weight. Ask what an ideal weight is for you.  Get at least 30 minutes of exercise most days of the week. Activities may include walking, swimming, or biking.  Include exercise to strengthen your muscles (resistance exercise), such as Pilates or lifting weights, as part of your weekly exercise routine. Try to do these types of exercises for 30 minutes at least 3 days a week.  Do not use any products that contain nicotine or tobacco, such as cigarettes, e-cigarettes, and chewing tobacco. If you need help quitting, ask your health care provider.  Monitor your blood pressure at home as told by your health care provider.  Keep all follow-up visits as told by your health care provider. This is important. Medicines  Take over-the-counter and prescription medicines only as told by your health care provider. Follow  directions carefully. Blood pressure medicines must be taken as prescribed.  Do not skip doses of blood pressure medicine. Doing this puts you at risk for problems and can make the medicine less effective.  Ask your health care provider about side effects or reactions to medicines that you should watch for. Contact a health care provider if you:  Think you are having a reaction to a medicine you are taking.  Have headaches that keep coming back (recurring).  Feel dizzy.  Have swelling in your ankles.  Have trouble with your vision. Get help right away if you:  Develop a severe headache or confusion.  Have unusual weakness or numbness.  Feel faint.  Have severe pain in your chest or abdomen.  Vomit repeatedly.  Have trouble breathing. Summary  Hypertension is when the force of blood pumping through your arteries is too strong. If this condition is not controlled, it may put you at risk for serious complications.  Your personal target blood pressure may vary depending on your medical conditions, your age, and other factors. For most people, a normal blood pressure is less than 120/80.  Hypertension is treated with lifestyle changes, medicines, or a combination of both. Lifestyle changes include losing weight, eating a healthy, low-sodium diet, exercising more, and limiting alcohol. This information is not intended to replace advice given to you by your health care provider. Make sure you discuss any questions you have with your health care provider. Document Revised: 08/10/2018 Document Reviewed: 08/10/2018 Elsevier Patient Education  2020 [employer]      Heidi Caroli, MD [facility]

## 2020-02-08 NOTE — Telephone Encounter (Signed)
Spoke with pt and scheduled ov with pcp

## 2020-02-08 NOTE — Patient Instructions (Addendum)
   If you have lab work done today you will be contacted with your lab results within the next 2 weeks.  If you have not heard from us then please contact us. The fastest way to get your results is to register for My Chart.   IF you received an x-ray today, you will receive an invoice from [facility]. Please contact [facility] at [phone] with questions or concerns regarding your invoice.   IF you received labwork today, you will receive an invoice from [facility]. Please contact [facility] at [phone] with questions or concerns regarding your invoice.   Our billing staff will not be able to assist you with questions regarding bills from these companies.  You will be contacted with the lab results as soon as they are available. The fastest way to get your results is to activate your My Chart account. Instructions are located on the last page of this paperwork. If you have not heard from us regarding the results in 2 weeks, please contact this office.      Hypertension, Adult High blood pressure (hypertension) is when the force of blood pumping through the arteries is too strong. The arteries are the blood vessels that carry blood from the heart throughout the body. Hypertension forces the heart to work harder to pump blood and may cause arteries to become narrow or stiff. Untreated or uncontrolled hypertension can cause a heart attack, heart failure, a stroke, kidney disease, and other problems. A blood pressure reading consists of a higher number over a lower number. Ideally, your blood pressure should be below 120/80. The first ("top") number is called the systolic pressure. It is a measure of the pressure in your arteries as your heart beats. The second ("bottom") number is called the diastolic pressure. It is a measure of the pressure in your arteries as the heart relaxes. What are the causes? The exact cause of this condition is not known. There are some conditions  that result in or are related to high blood pressure. What increases the risk? Some risk factors for high blood pressure are under your control. The following factors may make you more likely to develop this condition:  Smoking.  Having type 2 diabetes mellitus, high cholesterol, or both.  Not getting enough exercise or physical activity.  Being overweight.  Having too much fat, sugar, calories, or salt (sodium) in your diet.  Drinking too much alcohol. Some risk factors for high blood pressure may be difficult or impossible to change. Some of these factors include:  Having chronic kidney disease.  Having a family history of high blood pressure.  Age. Risk increases with age.  Race. You may be at higher risk if you are African American.  Gender. Men are at higher risk than women before age 45. After age 65, women are at higher risk than men.  Having obstructive sleep apnea.  Stress. What are the signs or symptoms? High blood pressure may not cause symptoms. Very high blood pressure (hypertensive crisis) may cause:  Headache.  Anxiety.  Shortness of breath.  Nosebleed.  Nausea and vomiting.  Vision changes.  Severe chest pain.  Seizures. How is this diagnosed? This condition is diagnosed by measuring your blood pressure while you are seated, with your arm resting on a flat surface, your legs uncrossed, and your feet flat on the floor. The cuff of the blood pressure monitor will be placed directly against the skin of your upper arm at the level of your   heart. It should be measured at least twice using the same arm. Certain conditions can cause a difference in blood pressure between your right and left arms. Certain factors can cause blood pressure readings to be lower or higher than normal for a short period of time:  When your blood pressure is higher when you are in a health care provider's office than when you are at home, this is called white coat hypertension.  Most people with this condition do not need medicines.  When your blood pressure is higher at home than when you are in a health care provider's office, this is called masked hypertension. Most people with this condition may need medicines to control blood pressure. If you have a high blood pressure reading during one visit or you have normal blood pressure with other risk factors, you may be asked to:  Return on a different day to have your blood pressure checked again.  Monitor your blood pressure at home for 1 week or longer. If you are diagnosed with hypertension, you may have other blood or imaging tests to help your health care provider understand your overall risk for other conditions. How is this treated? This condition is treated by making healthy lifestyle changes, such as eating healthy foods, exercising more, and reducing your alcohol intake. Your health care provider may prescribe medicine if lifestyle changes are not enough to get your blood pressure under control, and if:  Your systolic blood pressure is above 130.  Your diastolic blood pressure is above 80. Your personal target blood pressure may vary depending on your medical conditions, your age, and other factors. Follow these instructions at home: Eating and drinking   Eat a diet that is high in fiber and potassium, and low in sodium, added sugar, and fat. An example eating plan is called the DASH (Dietary Approaches to Stop Hypertension) diet. To eat this way: ? Eat plenty of fresh fruits and vegetables. Try to fill one half of your plate at each meal with fruits and vegetables. ? Eat whole grains, such as whole-wheat pasta, brown rice, or whole-grain bread. Fill about one fourth of your plate with whole grains. ? Eat or drink low-fat dairy products, such as skim milk or low-fat yogurt. ? Avoid fatty cuts of meat, processed or cured meats, and poultry with skin. Fill about one fourth of your plate with lean proteins, such  as fish, chicken without skin, beans, eggs, or tofu. ? Avoid pre-made and processed foods. These tend to be higher in sodium, added sugar, and fat.  Reduce your daily sodium intake. Most people with hypertension should eat less than 1,500 mg of sodium a day.  Do not drink alcohol if: ? Your health care provider tells you not to drink. ? You are pregnant, may be pregnant, or are planning to become pregnant.  If you drink alcohol: ? Limit how much you use to:  0-1 drink a day for women.  0-2 drinks a day for men. ? Be aware of how much alcohol is in your drink. In the [place] one drink equals one 12 oz bottle of beer (355 mL), one 5 oz glass of wine (148 mL), or one 1 oz glass of hard liquor (44 mL). Lifestyle   Work with your health care provider to maintain a healthy body weight or to lose weight. Ask what an ideal weight is for you.  Get at least 30 minutes of exercise most days of the week. Activities may include walking, swimming,   or biking.  Include exercise to strengthen your muscles (resistance exercise), such as Pilates or lifting weights, as part of your weekly exercise routine. Try to do these types of exercises for 30 minutes at least 3 days a week.  Do not use any products that contain nicotine or tobacco, such as cigarettes, e-cigarettes, and chewing tobacco. If you need help quitting, ask your health care provider.  Monitor your blood pressure at home as told by your health care provider.  Keep all follow-up visits as told by your health care provider. This is important. Medicines  Take over-the-counter and prescription medicines only as told by your health care provider. Follow directions carefully. Blood pressure medicines must be taken as prescribed.  Do not skip doses of blood pressure medicine. Doing this puts you at risk for problems and can make the medicine less effective.  Ask your health care provider about side effects or reactions to medicines that you  should watch for. Contact a health care provider if you:  Think you are having a reaction to a medicine you are taking.  Have headaches that keep coming back (recurring).  Feel dizzy.  Have swelling in your ankles.  Have trouble with your vision. Get help right away if you:  Develop a severe headache or confusion.  Have unusual weakness or numbness.  Feel faint.  Have severe pain in your chest or abdomen.  Vomit repeatedly.  Have trouble breathing. Summary  Hypertension is when the force of blood pumping through your arteries is too strong. If this condition is not controlled, it may put you at risk for serious complications.  Your personal target blood pressure may vary depending on your medical conditions, your age, and other factors. For most people, a normal blood pressure is less than 120/80.  Hypertension is treated with lifestyle changes, medicines, or a combination of both. Lifestyle changes include losing weight, eating a healthy, low-sodium diet, exercising more, and limiting alcohol. This information is not intended to replace advice given to you by your health care provider. Make sure you discuss any questions you have with your health care provider. Document Revised: 08/10/2018 Document Reviewed: 08/10/2018 Elsevier Patient Education  2020 Elsevier Inc.  

## 2020-02-09 ENCOUNTER — Telehealth: Payer: Self-pay | Admitting: Emergency Medicine

## 2020-02-09 LAB — HEMOGLOBIN A1C
Est. average glucose Bld gHb Est-mCnc: 114 mg/dL
Hgb A1c MFr Bld: 5.6 % (ref 4.8–5.6)

## 2020-02-09 LAB — CBC WITH DIFFERENTIAL/PLATELET
Basophils Absolute: 0 10*3/uL (ref 0.0–0.2)
Basos: 1 %
EOS (ABSOLUTE): 0.1 10*3/uL (ref 0.0–0.4)
Eos: 2 %
Hematocrit: 34 % (ref 34.0–46.6)
Hemoglobin: 11.2 g/dL (ref 11.1–15.9)
Immature Grans (Abs): 0 10*3/uL (ref 0.0–0.1)
Immature Granulocytes: 0 %
Lymphocytes Absolute: 1.7 10*3/uL (ref 0.7–3.1)
Lymphs: 37 %
MCH: 28.2 pg (ref 26.6–33.0)
MCHC: 32.9 g/dL (ref 31.5–35.7)
MCV: 86 fL (ref 79–97)
Monocytes Absolute: 0.3 10*3/uL (ref 0.1–0.9)
Monocytes: 7 %
Neutrophils Absolute: 2.4 10*3/uL (ref 1.4–7.0)
Neutrophils: 53 %
Platelets: 231 10*3/uL (ref 150–450)
RBC: 3.97 x10E6/uL (ref 3.77–5.28)
RDW: 12.9 % (ref 11.7–15.4)
WBC: 4.5 10*3/uL (ref 3.4–10.8)

## 2020-02-09 LAB — COMPREHENSIVE METABOLIC PANEL
ALT: 14 IU/L (ref 0–32)
AST: 17 IU/L (ref 0–40)
Albumin/Globulin Ratio: 1.2 (ref 1.2–2.2)
Albumin: 4.1 g/dL (ref 3.8–4.9)
Alkaline Phosphatase: 52 IU/L (ref 39–117)
BUN/Creatinine Ratio: 12 (ref 9–23)
BUN: 13 mg/dL (ref 6–24)
Bilirubin Total: 0.2 mg/dL (ref 0.0–1.2)
CO2: 23 mmol/L (ref 20–29)
Calcium: 9.1 mg/dL (ref 8.7–10.2)
Chloride: 106 mmol/L (ref 96–106)
Creatinine, Ser: 1.07 mg/dL — ABNORMAL HIGH (ref 0.57–1.00)
GFR calc Af Amer: 68 mL/min/{1.73_m2} (ref >59)
GFR calc non Af Amer: 59 mL/min/{1.73_m2} — ABNORMAL LOW (ref >59)
Globulin, Total: 3.3 g/dL (ref 1.5–4.5)
Glucose: 85 mg/dL (ref 65–99)
Potassium: 3.7 mmol/L (ref 3.5–5.2)
Sodium: 143 mmol/L (ref 134–144)
Total Protein: 7.4 g/dL (ref 6.0–8.5)

## 2020-02-09 LAB — LIPID PANEL
Chol/HDL Ratio: 3.3 ratio (ref 0.0–4.4)
Cholesterol, Total: 164 mg/dL (ref 100–199)
HDL: 49 mg/dL (ref >39)
LDL Chol Calc (NIH): 100 mg/dL — ABNORMAL HIGH (ref 0–99)
Triglycerides: 77 mg/dL (ref 0–149)
VLDL Cholesterol Cal: 15 mg/dL (ref 5–40)

## 2020-02-09 NOTE — Telephone Encounter (Signed)
  Wondering if she should be concerned  What symptoms do you have? Headache after taking BP  Medication   How long have you been sick? Just this morning   Have you been seen for this problem? no  If your provider decides to give you a prescription, which pharmacy would you like for it to be sent to?    Patient informed that this information will be sent to the clinical staff for review and that they should receive a follow up call.  Wondering if she should be concerned

## 2020-02-10 ENCOUNTER — Encounter: Payer: Self-pay | Admitting: Emergency Medicine

## 2020-02-13 NOTE — Telephone Encounter (Signed)
Spoke with pt and she stated that she was having headaches when she took the amlodipine and also her heart were skipping beats. She stated that he headache did go away. So I told her to lets start off fresh and for her to monitor her BP when she first gets up in the morning and write it dw and then once she takes her BP medication to wait 2hrs after taking and check her B again and see if she has any symptoms and give up a report tomorrow 02/14/2020 bc it could be that her body is just trying to adjust to the medication so before you decide to change it lets just monitor it and see how it goes and then we will know which direction we need to go.  Please Advise

## 2020-02-13 NOTE — Telephone Encounter (Signed)
Agree. Thanks

## 2020-02-15 ENCOUNTER — Encounter: Payer: Self-pay | Admitting: Emergency Medicine

## 2020-02-15 NOTE — Telephone Encounter (Signed)
Please Advise

## 2020-03-06 NOTE — Telephone Encounter (Signed)
FYI to provider   My chart blood pressure readings below:   Good morning, The information below was requested on 02/13/2020:  BP Readings - 02/14/2020 * 143/99 @ 8:30a * 139/100 @ 11:37a * 149/103 @ 4:55p * 141/92 @ 8:30p  Symptoms: * Heart skips occasionally  * Heart beats hard * Tightness in left leg * Extreme fatigue  * Vision blurred   Attempted to call pt. No answer. Left message to call back.

## 2020-03-06 NOTE — Telephone Encounter (Signed)
Why are we getting this message 2 weeks later?

## 2020-03-12 ENCOUNTER — Other Ambulatory Visit: Payer: Self-pay | Admitting: Emergency Medicine

## 2020-03-12 DIAGNOSIS — J302 Other seasonal allergic rhinitis: Secondary | ICD-10-CM

## 2020-03-14 ENCOUNTER — Encounter: Payer: Self-pay | Admitting: Emergency Medicine

## 2020-03-18 ENCOUNTER — Telehealth: Payer: Self-pay | Admitting: Emergency Medicine

## 2020-03-18 NOTE — Telephone Encounter (Signed)
Pt was taking 2( 20 mg) pills for total of 40 mg. Pt was told to monitor her bp and to increase 20 to 40 mg. Pt would like new rx lisinopril 40 mg once a day  [facility]

## 2020-03-18 NOTE — Telephone Encounter (Signed)
Pt has increased lisinopril and would like 40 mg sent to the [facility]

## 2020-03-18 NOTE — Telephone Encounter (Signed)
Pt called regarding her bp medication. Pt states she was trying a new medication for 3 months pt states she isn't on her third month yet but her bp this morning was high 144/100 and she states the reason she changed her medication was frequent urination and pt states that has started again. Pt would like a nurse to give her a call. [phone]

## 2020-03-18 NOTE — Telephone Encounter (Signed)
Pt insurance is requesting new Rx for 40 mg lisinopril daily be sent.  I can notify pt once it is sent.

## 2020-03-19 ENCOUNTER — Encounter: Payer: Self-pay | Admitting: Family Medicine

## 2020-03-19 ENCOUNTER — Ambulatory Visit: Payer: BC Managed Care – PPO | Admitting: Family Medicine

## 2020-03-19 ENCOUNTER — Other Ambulatory Visit: Payer: Self-pay | Admitting: Emergency Medicine

## 2020-03-19 ENCOUNTER — Telehealth: Payer: Self-pay | Admitting: *Deleted

## 2020-03-19 ENCOUNTER — Other Ambulatory Visit: Payer: Self-pay

## 2020-03-19 VITALS — BP 133/82 | HR 72 | Temp 98.0°F | Ht 67.0 in | Wt 187.0 lb

## 2020-03-19 DIAGNOSIS — I10 Essential (primary) hypertension: Secondary | ICD-10-CM | POA: Diagnosis not present

## 2020-03-19 MED ORDER — LISINOPRIL 40 MG PO TABS: 40.0000 mg | ORAL_TABLET | Freq: Every day | ORAL | 0 refills | Status: DC

## 2020-03-19 MED ORDER — HYDRALAZINE HCL 10 MG PO TABS: 10.0000 mg | ORAL_TABLET | Freq: Three times a day (TID) | ORAL | 0 refills | Status: DC

## 2020-03-19 MED ORDER — LISINOPRIL 40 MG PO TABS: 40.0000 mg | ORAL_TABLET | Freq: Every day | ORAL | 3 refills | Status: DC

## 2020-03-19 NOTE — Patient Instructions (Addendum)
  Discontinued amlodipine due to sensation of fatigue, heaviness of the eyes and swelling in the extremities Discontinued hctz due to patient frequent urination and the disruption to her daily life Continued lisinopril 40mg  daily which protects the kidnesy Discussed that since she had anaphylaxis to Bystolic thus will avoid all beta blockers Will use hydralazine three times a day with food. Please monitor your blood pressures at home Wear compression stockings and avoid salty foods Continue exercise  FOLLOW UP IN ONE MONTH with Dr. Mitchel Honour   If you have lab work done today you will be contacted with your lab results within the next 2 weeks.  If you have not heard from [place] then please contact us. The fastest way to get your results is to register for My Chart.   IF you received an x-ray today, you will receive an invoice from [facility]. Please contact [facility] at [phone] with questions or concerns regarding your invoice.   IF you received labwork today, you will receive an invoice from [facility]. Please contact [facility] at [phone] with questions or concerns regarding your invoice.   Our billing staff will not be able to assist you with questions regarding bills from these companies.  You will be contacted with the lab results as soon as they are available. The fastest way to get your results is to activate your My Chart account. Instructions are located on the last page of this paperwork. If you have not heard from [place] regarding the results in 2 weeks, please contact this office.

## 2020-03-19 NOTE — Telephone Encounter (Signed)
Call patient please.  New prescription sent.  Thanks.

## 2020-03-19 NOTE — Telephone Encounter (Signed)
On 03/18/2020, I spoke to the patient concerning elevated blood pressures and medication problems she was experiencing. Since patient's PCP Dr Mitchel Honour is out of the office and will return in another week or so, I spoke to Dr Nolon Rod about the issues. Dr Nolon Rod offered to double book an appointment for 03/19/2020 at 10:40 am, I told the patient and she did not take the appointment. On that day it would be patient's first day back to work and she wanted another day, other times were offered and did not work for the patient. I called the patient about five different times to help her schedule an appointment each time she stated she would have to explain the problem to the provider. The last time I called patient at the end of day she decided to take the appointment 03/19/2020 at 10:40 am . Again, I spoke to Dr Nolon Rod and she agreed. Appointment was scheduled.

## 2020-03-19 NOTE — Telephone Encounter (Signed)
New prescription sent. Thanks

## 2020-03-19 NOTE — Progress Notes (Signed)
Established Patient Office Visit  Subjective:  Patient ID: Heidi Herrera, female    DOB: [birth year]  Age: 54 y.o. MRN: [id number]  CC:  Chief Complaint  Patient presents with  . Follow-up    BP/medication refill    HPI Heidi Herrera presents for  She is taking lisinopril 40mg  and ran out for today but took it yesterday.  Pt reports that she had a reduction in HCTZ 12.5 due to frequent urination Her bp was not controlled adequately so she was started on amlodipine 5mg  and she felt like she was tired and feels like her eyes feels like they are heavy like when you get your eyes dilated.  She states that she still has frquent urination. She states that she continues to have elevated bp at home on her monitor She is taking lisinopril 40mg   She reports that at home her bp readings were high but she is using a wrist cuff.   She went to the Urologist and also did a journal of her urination and was going to the bathroom 13 times a day. BP Readings from Last 3 Encounters:  03/19/20 133/82  02/08/20 (!) 148/86  01/31/20 138/85     Past Medical History:  Diagnosis Date  . Acid reflux   . Allergy   . Anemia   . Anxiety   . Fibroids   . GERD (gastroesophageal reflux disease)   . Hypertension   . Kidney cysts   . Kidney failure    stage 2  . Migraine   . Obesity   . Sebaceous cyst   . Vertigo     Past Surgical History:  Procedure Laterality Date  . IR GENERIC HISTORICAL  12/01/2016   IR ANGIOGRAM PELVIS SELECTIVE OR SUPRASELECTIVE 12/01/2016 Sandi Mariscal, MD [facility]  . IR GENERIC HISTORICAL  12/01/2016   IR US GUIDE VASC ACCESS RIGHT 12/01/2016 Sandi Mariscal, MD [facility]  . IR GENERIC HISTORICAL  12/01/2016   IR EMBO ARTERIAL NOT HEMORR HEMANG INC GUIDE ROADMAPPING 12/01/2016 Sandi Mariscal, MD [facility]  . IR GENERIC HISTORICAL  12/01/2016   IR ANGIOGRAM SELECTIVE EACH ADDITIONAL VESSEL 12/01/2016 Sandi Mariscal, MD [facility]  . IR GENERIC HISTORICAL  12/01/2016     IR ANGIOGRAM SELECTIVE EACH ADDITIONAL VESSEL 12/01/2016 Sandi Mariscal, MD [facility]  . IR GENERIC HISTORICAL  12/01/2016   IR ANGIOGRAM PELVIS SELECTIVE OR SUPRASELECTIVE 12/01/2016 Sandi Mariscal, MD [facility]  . IR GENERIC HISTORICAL  01/07/2017   IR RADIOLOGIST EVAL & MGMT 01/07/2017 Sandi Mariscal, MD [facility]  . IR RADIOLOGIST EVAL & MGMT  08/31/2017  . LOBECTOMY  2009  . MYOMECTOMY  2007  . UTERINE FIBROID EMBOLIZATION     12/01/2016    Family History  Problem Relation Age of Onset  . Hypertension Mother   . Obesity Mother   . Stroke Mother   . Diabetes Mother   . Hypertension Sister   . Hyperlipidemia Sister   . Obesity Sister   . Diabetes Sister   . Heart disease Sister   . Mental illness Sister   . Glaucoma Sister   . Hypertension Brother   . Obesity Brother   . Stroke Brother   . Glaucoma Brother   . Mental illness Father   . Hypertension Father   . Cancer Brother   . Stroke Brother   . Hypertension Sister   . Mental illness Sister   . Glaucoma Maternal Uncle   . Cancer Sister  colon, dx in her late 81's  . Diabetes Sister     Social History   Socioeconomic History  . Marital status: Divorced    Spouse name: Not on file  . Number of children: 1  . Years of education: Not on file  . Highest education level: Master's degree (e.g., MA, MS, MEng, MEd, MSW, MBA)  Occupational History    Comment: [employer]  Tobacco Use  . Smoking status: Never Smoker  . Smokeless tobacco: Never Used  Substance and Sexual Activity  . Alcohol use: Not Currently    Alcohol/week: 1.0 standard drinks    Types: 1 Glasses of wine per week    Comment: quit years ago  . Drug use: No  . Sexual activity: Not on file  Other Topics Concern  . Not on file  Social History Narrative   Lives alone   No caffiene   Social Determinants of Health   Financial Resource Strain:   . Difficulty of Paying Living Expenses:   Food Insecurity:   . Worried About Sales executive in the Last Year:   . Arboriculturist in the Last Year:   Transportation Needs:   . Film/video editor (Medical):   Marland Kitchen Lack of Transportation (Non-Medical):   Physical Activity:   . Days of Exercise per Week:   . Minutes of Exercise per Session:   Stress:   . Feeling of Stress :   Social Connections:   . Frequency of Communication with Friends and Family:   . Frequency of Social Gatherings with Friends and Family:   . Attends Religious Services:   . Active Member of Clubs or Organizations:   . Attends Archivist Meetings:   Marland Kitchen Marital Status:   Intimate Partner Violence:   . Fear of Current or Ex-Partner:   . Emotionally Abused:   Marland Kitchen Physically Abused:   . Sexually Abused:     Outpatient Medications Prior to Visit  Medication Sig Dispense Refill  . amLODipine (NORVASC) 5 MG tablet Take 1 tablet (5 mg total) by mouth daily. 90 tablet 3  . Biotin 1000 MCG tablet Take 1,000 mcg by mouth daily.     Marland Kitchen bismuth-metronidazole-tetracycline (PYLERA) 140-125-125 MG capsule Take 3 capsules by mouth 4 (four) times daily -  before meals and at bedtime. 120 capsule 0  . calcium-vitamin D (OSCAL WITH D) 500-200 MG-UNIT per tablet Take 1 tablet by mouth 2 (two) times daily. Calcium 1000 mg with Magnesium 400 mg and zinc 15 mg, taking 1 capsule 2 times daily    . cetirizine (ZYRTEC) 10 MG tablet Take 1 tablet by mouth once daily 90 tablet 1  . Cyanocobalamin (VITAMIN B 12 PO) Take 1 tablet by mouth daily.    . hydrochlorothiazide (HYDRODIURIL) 12.5 MG tablet Take 1 tablet (12.5 mg total) by mouth daily. 90 tablet 3  . IFEREX 150 150 MG capsule Take 1 capsule by mouth twice daily 60 capsule 0  . lisinopril (ZESTRIL) 20 MG tablet Take 1 tablet (20 mg total) by mouth daily. 90 tablet 3  . Multiple Vitamins-Minerals (MULTIVITAMIN WOMENS 50+ ADV) TABS Take 1 tablet by mouth daily. 90 tablet 3  . omeprazole (PRILOSEC) 40 MG capsule Take 1 capsule (40 mg total) by mouth 2 (two) times  daily. 20 capsule 0   Facility-Administered Medications Prior to Visit  Medication Dose Route Frequency Provider Last Rate Last Admin  . 0.9 %  sodium chloride infusion  500 mL Intravenous  Once Pyrtle, Lajuan Lines, MD        Allergies  Allergen Reactions  . Bystolic [Nebivolol Hcl] Anaphylaxis, Itching and Swelling  . Biaxin [Clarithromycin]     The category of the Biaxin family.  Also allergic to water chestnuts.  . Erythromycin Base Rash    ROS Review of Systems See hpi   Objective:    Physical Exam  BP 133/82 (BP Location: Right Arm, Patient Position: Sitting, Cuff Size: Normal)   Pulse 72   Temp 98 F (36.7 C) (Temporal)   Ht 5\' 7"  (1.702 m)   Wt 187 lb (84.8 kg)   SpO2 98%   BMI 29.29 kg/m  Wt Readings from Last 3 Encounters:  03/19/20 187 lb (84.8 kg)  02/08/20 189 lb (85.7 kg)  01/31/20 186 lb (84.4 kg)   Physical Exam  Constitutional: Oriented to person, place, and time. Appears well-developed and well-nourished.  HENT:  Head: Normocephalic and atraumatic.  Eyes: Conjunctivae and EOM are normal.  Cardiovascular: Normal rate, regular rhythm, normal heart sounds and intact distal pulses.  No murmur heard. Pulmonary/Chest: Effort normal and breath sounds normal. No stridor. No respiratory distress. Has no wheezes.  Neurological: Is alert and oriented to person, place, and time.  Skin: Skin is warm. Capillary refill takes less than 2 seconds.  Psychiatric: Has a normal mood and affect. Behavior is normal. Judgment and thought content normal.    There are no preventive care reminders to display for this patient.  There are no preventive care reminders to display for this patient.  Lab Results  Component Value Date   TSH 2.070 11/20/2016   Lab Results  Component Value Date   WBC 4.5 02/08/2020   HGB 11.2 02/08/2020   HCT 34.0 02/08/2020   MCV 86 02/08/2020   PLT 231 02/08/2020   Lab Results  Component Value Date   NA 143 02/08/2020   K 3.7 02/08/2020     CO2 23 02/08/2020   GLUCOSE 85 02/08/2020   BUN 13 02/08/2020   CREATININE 1.07 (H) 02/08/2020   BILITOT 0.2 02/08/2020   ALKPHOS 52 02/08/2020   AST 17 02/08/2020   ALT 14 02/08/2020   PROT 7.4 02/08/2020   ALBUMIN 4.1 02/08/2020   CALCIUM 9.1 02/08/2020   ANIONGAP 8 12/02/2016   GFR 76.67 12/18/2010   Lab Results  Component Value Date   CHOL 164 02/08/2020   Lab Results  Component Value Date   HDL 49 02/08/2020   Lab Results  Component Value Date   LDLCALC 100 (H) 02/08/2020   Lab Results  Component Value Date   TRIG 77 02/08/2020   Lab Results  Component Value Date   CHOLHDL 3.3 02/08/2020   Lab Results  Component Value Date   HGBA1C 5.6 02/08/2020      Assessment & Plan:   Problem List Items Addressed This Visit      Cardiovascular and Mediastinum   Essential hypertension - Primary     Discontinued amlodipine due to sensation of fatigue, heaviness of the eyes and swelling in the extremities Discontinued hctz due to patient frequent urination and the disruption to her daily life Continued lisinopril 40mg  daily  Discussed that since she had anaphylaxis to Bystolic thus will avoid all beta blockers Will use hydralazine tid dosing Advised pt to monitor her bp at home Wear compression stockings and avoid salty foods Continue exercise  FOLLOW UP IN ONE MONTH No orders of the defined types were placed in this encounter.  Follow-up: No follow-ups on file.   A total of 25 minutes were spent face-to-face with the patient during this encounter and over half of that time was spent on counseling and coordination of care.   Forrest Moron, MD

## 2020-03-20 ENCOUNTER — Ambulatory Visit: Payer: BC Managed Care – PPO | Admitting: Family Medicine

## 2020-04-08 ENCOUNTER — Encounter: Payer: Self-pay | Admitting: *Deleted

## 2020-04-12 ENCOUNTER — Encounter: Payer: Self-pay | Admitting: Physician Assistant

## 2020-04-12 ENCOUNTER — Ambulatory Visit: Payer: BC Managed Care – PPO | Admitting: Physician Assistant

## 2020-04-12 ENCOUNTER — Other Ambulatory Visit: Payer: Self-pay | Admitting: *Deleted

## 2020-04-12 VITALS — BP 130/80 | HR 80 | Temp 98.2°F | Ht 64.75 in | Wt 191.5 lb

## 2020-04-12 DIAGNOSIS — R109 Unspecified abdominal pain: Secondary | ICD-10-CM

## 2020-04-12 DIAGNOSIS — K5900 Constipation, unspecified: Secondary | ICD-10-CM

## 2020-04-12 DIAGNOSIS — Z8619 Personal history of other infectious and parasitic diseases: Secondary | ICD-10-CM | POA: Diagnosis not present

## 2020-04-12 DIAGNOSIS — R110 Nausea: Secondary | ICD-10-CM | POA: Diagnosis not present

## 2020-04-12 MED ORDER — OMEPRAZOLE 40 MG PO CPDR: 40.0000 mg | DELAYED_RELEASE_CAPSULE | Freq: Two times a day (BID) | ORAL | 3 refills | Status: DC

## 2020-04-12 MED ORDER — ALIGN 4 MG PO CAPS: 1.0000 | ORAL_CAPSULE | Freq: Every day | ORAL | 2 refills | Status: DC

## 2020-04-12 NOTE — Patient Instructions (Signed)
You have been scheduled for a RUQ abdominal ultrasound at [facility] (1st floor of hospital) on Thursday, 04/18/20 at 8:30 am. Please arrive 15 minutes prior to your appointment for registration. Make certain not to have anything to eat or drink 6 hours prior to your appointment. Should you need to reschedule your appointment, please contact radiology at [phone]. This test typically takes about 30 minutes to perform. ____________________________________________________ Please keep a food journal and bring this back with you to your follow up appointment ___________________________________________________ Please follow up with Ellouise Newer, PA-C in 4-6 weeks. _________________________________________________ Make sure to get plenty of fiber and water in your diet. Get plenty of exercise. __________________________________________________ If you are age 37 or older, your body mass index should be between 23-30. Your Body mass index is 32.11 kg/m. If this is out of the aforementioned range listed, please consider follow up with your Primary Care Provider.  If you are age 56 or younger, your body mass index should be between 19-25. Your Body mass index is 32.11 kg/m. If this is out of the aformentioned range listed, please consider follow up with your Primary Care Provider.  ____________________________________________________ Due to recent changes in healthcare laws, you may see the results of your imaging and laboratory studies on MyChart before your provider has had a chance to review them.  We understand that in some cases there may be results that are confusing or concerning to you. Not all laboratory results come back in the same time frame and the provider may be waiting for multiple results in order to interpret others.  Please give [place] 48 hours in order for your provider to thoroughly review all the results before contacting the office for clarification of your results.

## 2020-04-12 NOTE — Progress Notes (Signed)
Chief Complaint: Abdominal pain  HPI:    Heidi Herrera is a 54 year old African-American female with a past medical history as listed below including CKD stage II, known to Dr. Hilarie Fredrickson, who presents to clinic today with a complaint of abdominal pain.    09/19/2019 patient seen in clinic for nausea and a constellation of other symptoms for which she was seeking a second opinion.  Was having a feeling of globus sensation as well as nausea.  Described family history of colon cancer in her sister passed away at the age of 54.  That time schedule patient for an EGD and colonoscopy.  Start the patient on omeprazole 40 mg daily.    10/18/2019 EGD with normal esophagus, gastritis and normal duodenum.  Colonoscopy the same day with one 3 mm polyp in the ascending colon, one 3 mm polyp in the transverse colon otherwise normal.  Pathology showed sessile serrated polyp with no dysplasia as well as H. pylori gastritis.  Patient was put on twice daily PPI and Pylera with a H. pylori stool antigen ordered 8 weeks after treatment (office PPI 7 days before stool collection).  Pete colonoscopy recommended in 5 years.    01/25/2020 H. pylori stool antigen negative.    02/08/2020 CBC normal, CMP with a creatinine 1.07 otherwise normal.    Today, patient presents to clinic and tells me that she is still nauseous sometimes, though this is greatly improved after being treated for H. pylori.  Tells me this can come and go typically after eating.  She is not sure if it is certain foods or not as she has not really paid attention.  She is currently using her Omeprazole 40 mg once daily and knows that if she misses this medicine her nausea is worse.  So it is definitely helping.    Also describes a pain that comes and goes in her left side at various times of the day.  Typically does not last very long.  Along with this describes constipation telling me that she often has to strain for a bowel movement and sometimes "I won't have one at  all".  Tells me she does not really drink a lot of water because it makes her go to the bathroom all the time.  Used to use Metamucil which seemed to help but it was "too sweet".  Reminds me of her sisters history of colon cancer.    Denies fever, chills, weight loss, blood in her stool or symptoms that awaken her from sleep.  Past Medical History:  Diagnosis Date  . Acid reflux   . Allergy   . Anemia   . Anxiety   . Fibroids   . GERD (gastroesophageal reflux disease)   . H. pylori infection   . Hypertension   . Kidney failure    stage 2  . Migraine   . Obesity   . Polycystic kidney disease   . Sebaceous cyst   . Sessile colonic polyp   . Vertigo     Past Surgical History:  Procedure Laterality Date  . IR GENERIC HISTORICAL  12/01/2016   IR ANGIOGRAM PELVIS SELECTIVE OR SUPRASELECTIVE 12/01/2016 Sandi Mariscal, MD [facility]  . IR GENERIC HISTORICAL  12/01/2016   IR US GUIDE VASC ACCESS RIGHT 12/01/2016 Sandi Mariscal, MD [facility]  . IR GENERIC HISTORICAL  12/01/2016   IR EMBO ARTERIAL NOT HEMORR HEMANG INC GUIDE ROADMAPPING 12/01/2016 Sandi Mariscal, MD [facility]  . IR GENERIC HISTORICAL  12/01/2016  IR [place] ADDITIONAL VESSEL 12/01/2016 Sandi Mariscal, MD [facility]  . IR GENERIC HISTORICAL  12/01/2016   IR ANGIOGRAM SELECTIVE EACH ADDITIONAL VESSEL 12/01/2016 Sandi Mariscal, MD [facility]  . IR GENERIC HISTORICAL  12/01/2016   IR ANGIOGRAM PELVIS SELECTIVE OR SUPRASELECTIVE 12/01/2016 Sandi Mariscal, MD [facility]  . IR GENERIC HISTORICAL  01/07/2017   IR RADIOLOGIST EVAL & MGMT 01/07/2017 Sandi Mariscal, MD [facility]  . IR RADIOLOGIST EVAL & MGMT  08/31/2017  . LOBECTOMY  2009  . MYOMECTOMY  2007  . UTERINE FIBROID EMBOLIZATION     12/01/2016    Current Outpatient Medications  Medication Sig Dispense Refill  . Biotin 1000 MCG tablet Take 1,000 mcg by mouth daily.     Marland Kitchen bismuth-metronidazole-tetracycline (PYLERA) 140-125-125 MG capsule Take 3  capsules by mouth 4 (four) times daily -  before meals and at bedtime. 120 capsule 0  . calcium-vitamin D (OSCAL WITH D) 500-200 MG-UNIT per tablet Take 1 tablet by mouth 2 (two) times daily. Calcium 1000 mg with Magnesium 400 mg and zinc 15 mg, taking 1 capsule 2 times daily    . cetirizine (ZYRTEC) 10 MG tablet Take 1 tablet by mouth once daily 90 tablet 1  . Cyanocobalamin (VITAMIN B 12 PO) Take 1 tablet by mouth daily.    . hydrALAZINE (APRESOLINE) 10 MG tablet Take 1 tablet (10 mg total) by mouth 3 (three) times daily. 90 tablet 0  . IFEREX 150 150 MG capsule Take 1 capsule by mouth twice daily 60 capsule 0  . lisinopril (ZESTRIL) 40 MG tablet Take 1 tablet (40 mg total) by mouth daily. 90 tablet 3  . Multiple Vitamins-Minerals (MULTIVITAMIN WOMENS 50+ ADV) TABS Take 1 tablet by mouth daily. 90 tablet 3  . omeprazole (PRILOSEC) 40 MG capsule Take 1 capsule (40 mg total) by mouth 2 (two) times daily. 20 capsule 0   Current Facility-Administered Medications  Medication Dose Route Frequency Provider Last Rate Last Admin  . 0.9 %  sodium chloride infusion  500 mL Intravenous Once Pyrtle, Lajuan Lines, MD        Allergies as of 04/12/2020 - Review Complete 03/19/2020  Allergen Reaction Noted  . Bystolic [nebivolol hcl] Anaphylaxis, Itching, and Swelling 10/24/2013  . Biaxin [clarithromycin]  04/20/2008  . Erythromycin base Rash 05/23/2012    Family History  Problem Relation Age of Onset  . Hypertension Mother   . Obesity Mother   . Stroke Mother   . Diabetes Mother   . Hypertension Sister   . Hyperlipidemia Sister   . Obesity Sister   . Diabetes Sister   . Heart disease Sister   . Mental illness Sister   . Glaucoma Sister   . Hypertension Brother   . Obesity Brother   . Stroke Brother   . Glaucoma Brother   . Mental illness Father   . Hypertension Father   . Cancer Brother   . Stroke Brother   . Hypertension Sister   . Mental illness Sister   . Glaucoma Maternal Uncle   .  Cancer Sister        colon, dx in her late 50's  . Diabetes Sister     Social History   Socioeconomic History  . Marital status: Divorced    Spouse name: Not on file  . Number of children: 1  . Years of education: Not on file  . Highest education level: Master's degree (e.g., MA, MS, MEng, MEd, MSW, MBA)  Occupational  History    Comment: [employer]  Tobacco Use  . Smoking status: Never Smoker  . Smokeless tobacco: Never Used  Substance and Sexual Activity  . Alcohol use: Not Currently    Alcohol/week: 1.0 standard drinks    Types: 1 Glasses of wine per week    Comment: quit years ago  . Drug use: No  . Sexual activity: Not on file  Other Topics Concern  . Not on file  Social History Narrative   Lives alone   No caffiene   Social Determinants of Health   Financial Resource Strain:   . Difficulty of Paying Living Expenses:   Food Insecurity:   . Worried About Charity fundraiser in the Last Year:   . Arboriculturist in the Last Year:   Transportation Needs:   . Film/video editor (Medical):   Marland Kitchen Lack of Transportation (Non-Medical):   Physical Activity:   . Days of Exercise per Week:   . Minutes of Exercise per Session:   Stress:   . Feeling of Stress :   Social Connections:   . Frequency of Communication with Friends and Family:   . Frequency of Social Gatherings with Friends and Family:   . Attends Religious Services:   . Active Member of Clubs or Organizations:   . Attends Archivist Meetings:   Marland Kitchen Marital Status:   Intimate Partner Violence:   . Fear of Current or Ex-Partner:   . Emotionally Abused:   Marland Kitchen Physically Abused:   . Sexually Abused:     Review of Systems:    Constitutional: No weight loss, fever or chills Cardiovascular: No chest pain  Respiratory: No SOB  Gastrointestinal: See HPI and otherwise negative   Physical Exam:  Vital signs: BP 130/80 (BP Location: Left Arm, Patient Position: Sitting, Cuff Size: Normal)   Pulse 80    Temp 98.2 F (36.8 C)   Ht 5' 4.75" (1.645 m) Comment: height measured without shoes  Wt 191 lb 8 oz (86.9 kg)   LMP 02/15/2020   BMI 32.11 kg/m   Constitutional:   Pleasant AA female appears to be in NAD, Well developed, Well nourished, alert and cooperative Respiratory: Respirations even and unlabored. Lungs clear to auscultation bilaterally.   No wheezes, crackles, or rhonchi.  Cardiovascular: Normal S1, S2. No MRG. Regular rate and rhythm. No peripheral edema, cyanosis or pallor.  Gastrointestinal:  Soft, nondistended, nontender. No rebound or guarding. Normal bowel sounds. No appreciable masses or hepatomegaly. Rectal:  Not performed.  Psychiatric: Demonstrates good judgement and reason without abnormal affect or behaviors.  RELEVANT LABS AND IMAGING: CBC    Component Value Date/Time   WBC 4.5 02/08/2020 1500   WBC 3.8 (A) 01/25/2018 0848   WBC 4.6 12/01/2016 1217   RBC 3.97 02/08/2020 1500   RBC 4.17 01/25/2018 0848   RBC 3.75 (L) 12/01/2016 1217   HGB 11.2 02/08/2020 1500   HCT 34.0 02/08/2020 1500   PLT 231 02/08/2020 1500   MCV 86 02/08/2020 1500   MCH 28.2 02/08/2020 1500   MCH 28.6 01/25/2018 0848   MCH 26.7 12/01/2016 1217   MCHC 32.9 02/08/2020 1500   MCHC 33.4 01/25/2018 0848   MCHC 33.4 12/01/2016 1217   RDW 12.9 02/08/2020 1500   LYMPHSABS 1.7 02/08/2020 1500   MONOABS 0.3 12/01/2016 1217   EOSABS 0.1 02/08/2020 1500   BASOSABS 0.0 02/08/2020 1500    CMP     Component Value Date/Time   NA  143 02/08/2020 1500   K 3.7 02/08/2020 1500   CL 106 02/08/2020 1500   CO2 23 02/08/2020 1500   GLUCOSE 85 02/08/2020 1500   GLUCOSE 135 (H) 12/02/2016 0411   BUN 13 02/08/2020 1500   CREATININE 1.07 (H) 02/08/2020 1500   CREATININE 1.17 (H) 04/04/2016 1048   CALCIUM 9.1 02/08/2020 1500   PROT 7.4 02/08/2020 1500   ALBUMIN 4.1 02/08/2020 1500   AST 17 02/08/2020 1500   ALT 14 02/08/2020 1500   ALKPHOS 52 02/08/2020 1500   BILITOT 0.2 02/08/2020 1500    GFRNONAA 59 (L) 02/08/2020 1500   GFRNONAA 49 (L) 02/13/2016 1942   GFRAA 68 02/08/2020 1500   GFRAA 56 (L) 02/13/2016 1942    Assessment: 1.  History of H. pylori gastritis: Treated with Pylera and twice daily PPI, fecal antigen negative in February 2.  Nausea: Continues, though much improved after treatment of H. Pylori; consider continued gastritis +/- gallbladder etiology 3.  Constipation: Strains for bowel movements and occasionally does not have any, recent colonoscopy November 2020 with 2 polyps and otherwise normal; likely related to diet and lack of water intake 4.  Left-sided abdominal pain: Consider relation to above versus gas  Plan: 1.  Patient is worried in regards to her family history of colon cancer and again asked me if she needs further evaluation for this.  Tried to ease her worries and explained that a colonoscopy is the best test for colon cancer.  She will not be due for another screening for another 4 years.  She verbalized understanding. 2.  Patient does continue with nausea which is better with Omeprazole.  We will try to increase this to twice daily to see if this helps at all.  Prescribed Omeprazole 40 mg twice daily, 30 to 60 minutes before breakfast and dinner #60 with 5 refills. 3.  Patient reminds me of discussion possible gallbladder etiology.  Ordered right upper quadrant ultrasound for further eval.  If this is normal would recommend a HIDA scan. 4.  Recommend the patient keep a food journal to see if there are any specific foods which cause her more nausea than others. 5.  Discussed increased fiber, water and exercise.  Discussed various forms of fiber.  If this is unhelpful then can add MiraLAX. 6.  Patient wanted to start taking Align once daily, asked for a prescription so she could ger her FSA acct to pay for it. Provided her with prescription 7.  Patient to follow with me in 4 to 6 weeks or sooner if necessary.  Ellouise Newer, PA-C [facility] 04/12/2020, 10:28 AM  Cc: Horald Pollen, *

## 2020-04-17 ENCOUNTER — Other Ambulatory Visit: Payer: Self-pay

## 2020-04-17 ENCOUNTER — Encounter: Payer: Self-pay | Admitting: Emergency Medicine

## 2020-04-17 ENCOUNTER — Ambulatory Visit: Payer: BC Managed Care – PPO | Admitting: Emergency Medicine

## 2020-04-17 VITALS — BP 155/85 | HR 74 | Temp 98.2°F | Ht 65.0 in | Wt 192.0 lb

## 2020-04-17 DIAGNOSIS — M79601 Pain in right arm: Secondary | ICD-10-CM

## 2020-04-17 DIAGNOSIS — I10 Essential (primary) hypertension: Secondary | ICD-10-CM

## 2020-04-17 MED ORDER — LOSARTAN POTASSIUM 100 MG PO TABS: 100.0000 mg | ORAL_TABLET | Freq: Every day | ORAL | 3 refills | Status: DC

## 2020-04-17 NOTE — Progress Notes (Signed)
Heidi Herrera 54 y.o.   Chief Complaint  Patient presents with  . Follow-up    x1 month Hypertension     Problem List Items Addressed This Visit            Cardiovascular and Mediastinum    Essential hypertension - Primary      Discontinued amlodipine due to sensation of fatigue, heaviness of the eyes and swelling in the extremities Discontinued hctz due to patient frequent urination and the disruption to her daily life Continued lisinopril 40mg  daily  Discussed that since she had anaphylaxis to Bystolic thus will avoid all beta blockers Will use hydralazine tid dosing Advised pt to monitor her bp at home Wear compression stockings and avoid salty foods Continue exercise  FOLLOW UP IN ONE MONTH No orders of the defined types were placed in this encounter.   HISTORY OF PRESENT ILLNESS: This is a 54 y.o. female with history of hypertension here for follow-up.  Saw Dr. Nolon Rod on 03/19/2020.  Assessment and plan as above.  I agree with it.  Blood pressure readings at home have been high. Patient has been on lisinopril for at least 2 years.  Allergic to beta-blockers.  Recently taken off diuretics due to urinary frequency interfering with daily activities.  Amlodipine was stopped during last visit due to side effects and feels a lot better.  Was started on hydralazine 10 mg 3 times a day.  No side effects.  HPI   Prior to Admission medications   Medication Sig Start Date End Date Taking? Authorizing Provider  Biotin 1000 MCG tablet Take 1,000 mcg by mouth daily.    Yes [provider]  calcium-vitamin D (OSCAL WITH D) 500-200 MG-UNIT per tablet Take 1 tablet by mouth 2 (two) times daily. Calcium 1000 mg with Magnesium 400 mg and zinc 15 mg, taking 1 capsule 2 times daily   Yes [provider]  cetirizine (ZYRTEC) 10 MG tablet Take 1 tablet by mouth once daily 03/12/20  Yes Bless Lisenby, Marion, MD  Cyanocobalamin (VITAMIN B 12 PO) Take 1 tablet by mouth  daily.   Yes [provider]  hydrALAZINE (APRESOLINE) 10 MG tablet Take 1 tablet (10 mg total) by mouth 3 (three) times daily. 03/19/20  Yes Forrest Moron, MD  IFEREX 150 150 MG capsule Take 1 capsule by mouth twice daily 07/05/19  Yes Shyniece Scripter, Ines Bloomer, MD  lisinopril (ZESTRIL) 40 MG tablet Take 1 tablet (40 mg total) by mouth daily. 03/19/20  Yes Gracie Gupta, Ines Bloomer, MD  Multiple Vitamins-Minerals (MULTIVITAMIN WOMENS 50+ ADV) TABS Take 1 tablet by mouth daily. 01/31/20 04/30/20 Yes Adalind Weitz, Ines Bloomer, MD  omeprazole (PRILOSEC) 40 MG capsule Take 1 capsule (40 mg total) by mouth 2 (two) times daily. 04/12/20  Yes Levin Erp, PA  Probiotic Product (ALIGN) 4 MG CAPS Take 1 capsule (4 mg total) by mouth daily. 04/12/20  Yes Levin Erp, PA    Allergies  Allergen Reactions  . Bystolic [Nebivolol Hcl] Anaphylaxis, Itching and Swelling  . Biaxin [Clarithromycin]     The category of the Biaxin family.  Also allergic to water chestnuts.  . Erythromycin Base Rash    Patient Active Problem List   Diagnosis Date Noted  . Body mass index (BMI) of 32.0-32.9 in adult 01/25/2018  . CKD (chronic kidney disease) stage 3, GFR 30-59 ml/min (HCC) 04/15/2017  . Fibroid 12/01/2016  . Fibroid, uterine   . Essential hypertension 09/29/2014  . Chronic anemia 02/02/2014  .  VITAMIN D DEFICIENCY 12/18/2010  . HYPERLIPIDEMIA 12/18/2010  . NEPHROLITHIASIS 03/24/2010  . 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 Gastroesophageal reflux disease without esophagitis 08/27/2009  . FIBROADENOSIS, BREAST 02/10/2007    Past Medical History:  Diagnosis Date  . Acid reflux   . Allergy   . Anemia   . Anxiety   . Fibroids   . GERD (gastroesophageal reflux disease)   . H. pylori infection   . Hypertension   . Kidney failure    stage 2  . Migraine   . Obesity   . Polycystic kidney disease   . Sebaceous cyst   . Sessile colonic polyp   . Vertigo     Past Surgical History:    Procedure Laterality Date  . IR GENERIC HISTORICAL  12/01/2016   IR ANGIOGRAM PELVIS SELECTIVE OR SUPRASELECTIVE 12/01/2016 Sandi Mariscal, MD [facility]  . IR GENERIC HISTORICAL  12/01/2016   IR US GUIDE VASC ACCESS RIGHT 12/01/2016 Sandi Mariscal, MD [facility]  . IR GENERIC HISTORICAL  12/01/2016   IR EMBO ARTERIAL NOT HEMORR HEMANG INC GUIDE ROADMAPPING 12/01/2016 Sandi Mariscal, MD [facility]  . IR GENERIC HISTORICAL  12/01/2016   IR ANGIOGRAM SELECTIVE EACH ADDITIONAL VESSEL 12/01/2016 Sandi Mariscal, MD [facility]  . IR GENERIC HISTORICAL  12/01/2016   IR ANGIOGRAM SELECTIVE EACH ADDITIONAL VESSEL 12/01/2016 Sandi Mariscal, MD [facility]  . IR GENERIC HISTORICAL  12/01/2016   IR ANGIOGRAM PELVIS SELECTIVE OR SUPRASELECTIVE 12/01/2016 Sandi Mariscal, MD [facility]  . IR GENERIC HISTORICAL  01/07/2017   IR RADIOLOGIST EVAL & MGMT 01/07/2017 Sandi Mariscal, MD [facility]  . IR RADIOLOGIST EVAL & MGMT  08/31/2017  . LOBECTOMY  2009  . MYOMECTOMY  2007  . UTERINE FIBROID EMBOLIZATION     12/01/2016    Social History   Socioeconomic History  . Marital status: Divorced    Spouse name: Not on file  . Number of children: 1  . Years of education: Not on file  . Highest education level: Master's degree (e.g., MA, MS, MEng, MEd, MSW, MBA)  Occupational History    Comment: [employer]  Tobacco Use  . Smoking status: Never Smoker  . Smokeless tobacco: Never Used  Substance and Sexual Activity  . Alcohol use: Not Currently    Alcohol/week: 1.0 standard drinks    Types: 1 Glasses of wine per week    Comment: quit years ago  . Drug use: No  . Sexual activity: Not on file  Other Topics Concern  . Not on file  Social History Narrative   Lives alone   No caffiene   Social Determinants of Health   Financial Resource Strain:   . Difficulty of Paying Living Expenses:   Food Insecurity:   . Worried About Charity fundraiser in the Last Year:   . Arboriculturist in the Last Year:    Transportation Needs:   . Film/video editor (Medical):   Marland Kitchen Lack of Transportation (Non-Medical):   Physical Activity:   . Days of Exercise per Week:   . Minutes of Exercise per Session:   Stress:   . Feeling of Stress :   Social Connections:   . Frequency of Communication with Friends and Family:   . Frequency of Social Gatherings with Friends and Family:   . Attends Religious Services:   . Active Member of Clubs or Organizations:   . Attends Archivist Meetings:   Marland Kitchen Marital Status:   Intimate  Partner Violence:   . Fear of Current or Ex-Partner:   . Emotionally Abused:   Marland Kitchen Physically Abused:   . Sexually Abused:     Family History  Problem Relation Age of Onset  . Hypertension Mother   . Obesity Mother   . Stroke Mother   . Diabetes Mother   . Hypertension Sister   . Hyperlipidemia Sister   . Obesity Sister   . Diabetes Sister   . Heart disease Sister   . Mental illness Sister   . Glaucoma Sister   . Hypertension Brother   . Obesity Brother   . Stroke Brother   . Glaucoma Brother   . Mental illness Father   . Hypertension Father   . Cancer Brother   . Stroke Brother   . Hypertension Sister   . Mental illness Sister   . Glaucoma Maternal Uncle   . Cancer Sister        colon, dx in her late 75's  . Diabetes Sister      Review of Systems  Constitutional: Negative.  Negative for chills and fever.  HENT: Negative.  Negative for congestion and sore throat.   Eyes: Negative.   Respiratory: Negative.  Negative for cough and shortness of breath.   Cardiovascular: Negative.  Negative for chest pain and palpitations.  Gastrointestinal: Negative.  Negative for abdominal pain, blood in stool, diarrhea, nausea and vomiting.  Genitourinary: Negative.  Negative for dysuria.  Musculoskeletal: Negative for myalgias.       Chronic right arm pain  Skin: Negative.  Negative for rash.  Neurological: Negative.  Negative for dizziness and headaches.  All other  systems reviewed and are negative.  Vitals:   04/17/20 1418 04/17/20 1419  BP: (!) 172/96 (!) 155/85  Pulse: 74   Temp: 98.2 F (36.8 C)   SpO2: 96%      Physical Exam Vitals reviewed.  Constitutional:      Appearance: Normal appearance.  HENT:     Head: Normocephalic.  Eyes:     Extraocular Movements: Extraocular movements intact.     Conjunctiva/sclera: Conjunctivae normal.     Pupils: Pupils are equal, round, and reactive to light.  Cardiovascular:     Rate and Rhythm: Normal rate and regular rhythm.     Heart sounds: Normal heart sounds.  Pulmonary:     Effort: Pulmonary effort is normal.     Breath sounds: Normal breath sounds.  Musculoskeletal:        General: Normal range of motion.     Cervical back: Normal range of motion and neck supple.  Skin:    General: Skin is warm and dry.  Neurological:     Mental Status: She is alert.  Psychiatric:        Mood and Affect: Mood normal.        Behavior: Behavior normal.    A total of 30 minutes was spent with the patient, greater than 50% of which was in counseling/coordination of care regarding hypertension and management including change of medications, blood pressure home monitoring, diet and nutrition, review of most recent office visit notes, review of most recent blood work results, prognosis and need for follow-up in 4 weeks.   ASSESSMENT & PLAN: Richa was seen today for follow-up.  Diagnoses and all orders for this visit:  Uncontrolled hypertension  Essential hypertension -     losartan (COZAAR) 100 MG tablet; Take 1 tablet (100 mg total) by mouth daily.  Right arm pain -  Ambulatory referral to Orthopedic Surgery    Patient Instructions    Stop lisinopril. Start losartan 100 mg daily.  Take hydralazine 20 mg 3 times daily. Continue monitoring blood pressure at home Follow-up in 4 weeks DASH Eating Plan DASH stands for "Dietary Approaches to Stop Hypertension." The DASH eating plan is a  healthy eating plan that has been shown to reduce high blood pressure (hypertension). It may also reduce your risk for type 2 diabetes, heart disease, and stroke. The DASH eating plan may also help with weight loss. What are tips for following this plan?  General guidelines  Avoid eating more than 2,300 mg (milligrams) of salt (sodium) a day. If you have hypertension, you may need to reduce your sodium intake to 1,500 mg a day.  Limit alcohol intake to no more than 1 drink a day for nonpregnant women and 2 drinks a day for men. One drink equals 12 oz of beer, 5 oz of wine, or 1 oz of hard liquor.  Work with your health care provider to maintain a healthy body weight or to lose weight. Ask what an ideal weight is for you.  Get at least 30 minutes of exercise that causes your heart to beat faster (aerobic exercise) most days of the week. Activities may include walking, swimming, or biking.  Work with your health care provider or diet and nutrition specialist (dietitian) to adjust your eating plan to your individual calorie needs. Reading food labels   Check food labels for the amount of sodium per serving. Choose foods with less than 5 percent of the Daily Value of sodium. Generally, foods with less than 300 mg of sodium per serving fit into this eating plan.  To find whole grains, look for the word "whole" as the first word in the ingredient list. Shopping  Buy products labeled as "low-sodium" or "no salt added."  Buy fresh foods. Avoid canned foods and premade or frozen meals. Cooking  Avoid adding salt when cooking. Use salt-free seasonings or herbs instead of table salt or sea salt. Check with your health care provider or pharmacist before using salt substitutes.  Do not fry foods. Cook foods using healthy methods such as baking, boiling, grilling, and broiling instead.  Cook with heart-healthy oils, such as olive, canola, soybean, or sunflower oil. Meal planning  Eat a balanced  diet that includes: ? 5 or more servings of fruits and vegetables each day. At each meal, try to fill half of your plate with fruits and vegetables. ? Up to 6-8 servings of whole grains each day. ? Less than 6 oz of lean meat, poultry, or fish each day. A 3-oz serving of meat is about the same size as a deck of cards. One egg equals 1 oz. ? 2 servings of low-fat dairy each day. ? A serving of nuts, seeds, or beans 5 times each week. ? Heart-healthy fats. Healthy fats called Omega-3 fatty acids are found in foods such as flaxseeds and coldwater fish, like sardines, salmon, and mackerel.  Limit how much you eat of the following: ? Canned or prepackaged foods. ? Food that is high in trans fat, such as fried foods. ? Food that is high in saturated fat, such as fatty meat. ? Sweets, desserts, sugary drinks, and other foods with added sugar. ? Full-fat dairy products.  Do not salt foods before eating.  Try to eat at least 2 vegetarian meals each week.  Eat more home-cooked food and less restaurant, buffet, and fast  food.  When eating at a restaurant, ask that your food be prepared with less salt or no salt, if possible. What foods are recommended? The items listed may not be a complete list. Talk with your dietitian about what dietary choices are best for you. Grains Whole-grain or whole-wheat bread. Whole-grain or whole-wheat pasta. Brown rice. [place] Morrow. Bulgur. Whole-grain and low-sodium cereals. Pita bread. Low-fat, low-sodium crackers. Whole-wheat flour tortillas. Vegetables Fresh or frozen vegetables (raw, steamed, roasted, or grilled). Low-sodium or reduced-sodium tomato and vegetable juice. Low-sodium or reduced-sodium tomato sauce and tomato paste. Low-sodium or reduced-sodium canned vegetables. Fruits All fresh, dried, or frozen fruit. Canned fruit in natural juice (without added sugar). Meat and other protein foods Skinless chicken or [place]. Ground chicken or [place]. Pork  with fat trimmed off. Fish and seafood. Egg whites. Dried beans, peas, or lentils. Unsalted nuts, nut butters, and seeds. Unsalted canned beans. Lean cuts of beef with fat trimmed off. Low-sodium, lean deli meat. Dairy Low-fat (1%) or fat-free (skim) milk. Fat-free, low-fat, or reduced-fat cheeses. Nonfat, low-sodium ricotta or cottage cheese. Low-fat or nonfat yogurt. Low-fat, low-sodium cheese. Fats and oils Soft margarine without trans fats. Vegetable oil. Low-fat, reduced-fat, or light mayonnaise and salad dressings (reduced-sodium). Canola, safflower, olive, soybean, and sunflower oils. Avocado. Seasoning and other foods Herbs. Spices. Seasoning mixes without salt. Unsalted popcorn and pretzels. Fat-free sweets. What foods are not recommended? The items listed may not be a complete list. Talk with your dietitian about what dietary choices are best for you. Grains Baked goods made with fat, such as croissants, muffins, or some breads. Dry pasta or rice meal packs. Vegetables Creamed or fried vegetables. Vegetables in a cheese sauce. Regular canned vegetables (not low-sodium or reduced-sodium). Regular canned tomato sauce and paste (not low-sodium or reduced-sodium). Regular tomato and vegetable juice (not low-sodium or reduced-sodium). Angie Fava. Olives. Fruits Canned fruit in a light or heavy syrup. Fried fruit. Fruit in cream or butter sauce. Meat and other protein foods Fatty cuts of meat. Ribs. Fried meat. Berniece Salines. Sausage. Bologna and other processed lunch meats. Salami. Fatback. Hotdogs. Bratwurst. Salted nuts and seeds. Canned beans with added salt. Canned or smoked fish. Whole eggs or egg yolks. Chicken or [place] with skin. Dairy Whole or 2% milk, cream, and half-and-half. Whole or full-fat cream cheese. Whole-fat or sweetened yogurt. Full-fat cheese. Nondairy creamers. Whipped toppings. Processed cheese and cheese spreads. Fats and oils Butter. Stick margarine. Lard. Shortening. Ghee.  Bacon fat. Tropical oils, such as coconut, palm kernel, or palm oil. Seasoning and other foods Salted popcorn and pretzels. Onion salt, garlic salt, seasoned salt, table salt, and sea salt. Worcestershire sauce. Tartar sauce. Barbecue sauce. Teriyaki sauce. Soy sauce, including reduced-sodium. Steak sauce. Canned and packaged gravies. Fish sauce. Oyster sauce. Cocktail sauce. Horseradish that you find on the shelf. Ketchup. Mustard. Meat flavorings and tenderizers. Bouillon cubes. Hot sauce and Tabasco sauce. Premade or packaged marinades. Premade or packaged taco seasonings. Relishes. Regular salad dressings. Where to find more information:  National Heart, Lung, and [place]: https://wilson-eaton.com/  American Heart Association: www.heart.org Summary  The DASH eating plan is a healthy eating plan that has been shown to reduce high blood pressure (hypertension). It may also reduce your risk for type 2 diabetes, heart disease, and stroke.  With the DASH eating plan, you should limit salt (sodium) intake to 2,300 mg a day. If you have hypertension, you may need to reduce your sodium intake to 1,500 mg a day.  When on the DASH  eating plan, aim to eat more fresh fruits and vegetables, whole grains, lean proteins, low-fat dairy, and heart-healthy fats.  Work with your health care provider or diet and nutrition specialist (dietitian) to adjust your eating plan to your individual calorie needs. This information is not intended to replace advice given to you by your health care provider. Make sure you discuss any questions you have with your health care provider. Document Revised: 11/12/2017 Document Reviewed: 11/23/2016 Elsevier Patient Education  [place].  Hypertension, Adult High blood pressure (hypertension) is when the force of blood pumping through the arteries is too strong. The arteries are the blood vessels that carry blood from the heart throughout the body. Hypertension forces  the heart to work harder to pump blood and may cause arteries to become narrow or stiff. Untreated or uncontrolled hypertension can cause a heart attack, heart failure, a stroke, kidney disease, and other problems. A blood pressure reading consists of a higher number over a lower number. Ideally, your blood pressure should be below 120/80. The first ("top") number is called the systolic pressure. It is a measure of the pressure in your arteries as your heart beats. The second ("bottom") number is called the diastolic pressure. It is a measure of the pressure in your arteries as the heart relaxes. What are the causes? The exact cause of this condition is not known. There are some conditions that result in or are related to high blood pressure. What increases the risk? Some risk factors for high blood pressure are under your control. The following factors may make you more likely to develop this condition:  Smoking.  Having type 2 diabetes mellitus, high cholesterol, or both.  Not getting enough exercise or physical activity.  Being overweight.  Having too much fat, sugar, calories, or salt (sodium) in your diet.  Drinking too much alcohol. Some risk factors for high blood pressure may be difficult or impossible to change. Some of these factors include:  Having chronic kidney disease.  Having a family history of high blood pressure.  Age. Risk increases with age.  Race. You may be at higher risk if you are African American.  Gender. Men are at higher risk than women before age 33. After age 31, women are at higher risk than men.  Having obstructive sleep apnea.  Stress. What are the signs or symptoms? High blood pressure may not cause symptoms. Very high blood pressure (hypertensive crisis) may cause:  Headache.  Anxiety.  Shortness of breath.  Nosebleed.  Nausea and vomiting.  Vision changes.  Severe chest pain.  Seizures. How is this diagnosed? This condition is  diagnosed by measuring your blood pressure while you are seated, with your arm resting on a flat surface, your legs uncrossed, and your feet flat on the floor. The cuff of the blood pressure monitor will be placed directly against the skin of your upper arm at the level of your heart. It should be measured at least twice using the same arm. Certain conditions can cause a difference in blood pressure between your right and left arms. Certain factors can cause blood pressure readings to be lower or higher than normal for a short period of time:  When your blood pressure is higher when you are in a health care provider's office than when you are at home, this is called white coat hypertension. Most people with this condition do not need medicines.  When your blood pressure is higher at home than when you are  in a health care provider's office, this is called masked hypertension. Most people with this condition may need medicines to control blood pressure. If you have a high blood pressure reading during one visit or you have normal blood pressure with other risk factors, you may be asked to:  Return on a different day to have your blood pressure checked again.  Monitor your blood pressure at home for 1 week or longer. If you are diagnosed with hypertension, you may have other blood or imaging tests to help your health care provider understand your overall risk for other conditions. How is this treated? This condition is treated by making healthy lifestyle changes, such as eating healthy foods, exercising more, and reducing your alcohol intake. Your health care provider may prescribe medicine if lifestyle changes are not enough to get your blood pressure under control, and if:  Your systolic blood pressure is above 130.  Your diastolic blood pressure is above 80. Your personal target blood pressure may vary depending on your medical conditions, your age, and other factors. Follow these instructions at  home: Eating and drinking   Eat a diet that is high in fiber and potassium, and low in sodium, added sugar, and fat. An example eating plan is called the DASH (Dietary Approaches to Stop Hypertension) diet. To eat this way: ? Eat plenty of fresh fruits and vegetables. Try to fill one half of your plate at each meal with fruits and vegetables. ? Eat whole grains, such as whole-wheat pasta, brown rice, or whole-grain bread. Fill about one fourth of your plate with whole grains. ? Eat or drink low-fat dairy products, such as skim milk or low-fat yogurt. ? Avoid fatty cuts of meat, processed or cured meats, and poultry with skin. Fill about one fourth of your plate with lean proteins, such as fish, chicken without skin, beans, eggs, or tofu. ? Avoid pre-made and processed foods. These tend to be higher in sodium, added sugar, and fat.  Reduce your daily sodium intake. Most people with hypertension should eat less than 1,500 mg of sodium a day.  Do not drink alcohol if: ? Your health care provider tells you not to drink. ? You are pregnant, may be pregnant, or are planning to become pregnant.  If you drink alcohol: ? Limit how much you use to:  0-1 drink a day for women.  0-2 drinks a day for men. ? Be aware of how much alcohol is in your drink. In the [place] one drink equals one 12 oz bottle of beer (355 mL), one 5 oz glass of wine (148 mL), or one 1 oz glass of hard liquor (44 mL). Lifestyle   Work with your health care provider to maintain a healthy body weight or to lose weight. Ask what an ideal weight is for you.  Get at least 30 minutes of exercise most days of the week. Activities may include walking, swimming, or biking.  Include exercise to strengthen your muscles (resistance exercise), such as Pilates or lifting weights, as part of your weekly exercise routine. Try to do these types of exercises for 30 minutes at least 3 days a week.  Do not use any products that contain  nicotine or tobacco, such as cigarettes, e-cigarettes, and chewing tobacco. If you need help quitting, ask your health care provider.  Monitor your blood pressure at home as told by your health care provider.  Keep all follow-up visits as told by your health care provider. This is important.  Medicines  Take over-the-counter and prescription medicines only as told by your health care provider. Follow directions carefully. Blood pressure medicines must be taken as prescribed.  Do not skip doses of blood pressure medicine. Doing this puts you at risk for problems and can make the medicine less effective.  Ask your health care provider about side effects or reactions to medicines that you should watch for. Contact a health care provider if you:  Think you are having a reaction to a medicine you are taking.  Have headaches that keep coming back (recurring).  Feel dizzy.  Have swelling in your ankles.  Have trouble with your vision. Get help right away if you:  Develop a severe headache or confusion.  Have unusual weakness or numbness.  Feel faint.  Have severe pain in your chest or abdomen.  Vomit repeatedly.  Have trouble breathing. Summary  Hypertension is when the force of blood pumping through your arteries is too strong. If this condition is not controlled, it may put you at risk for serious complications.  Your personal target blood pressure may vary depending on your medical conditions, your age, and other factors. For most people, a normal blood pressure is less than 120/80.  Hypertension is treated with lifestyle changes, medicines, or a combination of both. Lifestyle changes include losing weight, eating a healthy, low-sodium diet, exercising more, and limiting alcohol. This information is not intended to replace advice given to you by your health care provider. Make sure you discuss any questions you have with your health care provider. Document Revised: 08/10/2018  Document Reviewed: 08/10/2018 Elsevier Patient Education  [employer].    If you have lab work done today you will be contacted with your lab results within the next 2 weeks.  If you have not heard from [place] then please contact us. The fastest way to get your results is to register for My Chart.   IF you received an x-ray today, you will receive an invoice from [facility]. Please contact [facility] at [phone] with questions or concerns regarding your invoice.   IF you received labwork today, you will receive an invoice from [place]. Please contact [facility] at [phone] with questions or concerns regarding your invoice.   Our billing staff will not be able to assist you with questions regarding bills from these companies.  You will be contacted with the lab results as soon as they are available. The fastest way to get your results is to activate your My Chart account. Instructions are located on the last page of this paperwork. If you have not heard from [place] regarding the results in 2 weeks, please contact this office.         Agustina Caroli, MD [facility]

## 2020-04-17 NOTE — Assessment & Plan Note (Signed)
Uncontrolled hypertension.  Lisinopril ineffective.  Will change to losartan 100 mg daily.  Continue hydralazine but advised to increase dose to 20 mg 3 times a day.  Continue monitoring blood pressure readings at home. DASH diet recommended. Follow-up in 4 weeks.

## 2020-04-17 NOTE — Patient Instructions (Addendum)
Stop lisinopril. Start losartan 100 mg daily.  Take hydralazine 20 mg 3 times daily. Continue monitoring blood pressure at home Follow-up in 4 weeks DASH Eating Plan DASH stands for "Dietary Approaches to Stop Hypertension." The DASH eating plan is a healthy eating plan that has been shown to reduce high blood pressure (hypertension). It may also reduce your risk for type 2 diabetes, heart disease, and stroke. The DASH eating plan may also help with weight loss. What are tips for following this plan?  General guidelines  Avoid eating more than 2,300 mg (milligrams) of salt (sodium) a day. If you have hypertension, you may need to reduce your sodium intake to 1,500 mg a day.  Limit alcohol intake to no more than 1 drink a day for nonpregnant women and 2 drinks a day for men. One drink equals 12 oz of beer, 5 oz of wine, or 1 oz of hard liquor.  Work with your health care provider to maintain a healthy body weight or to lose weight. Ask what an ideal weight is for you.  Get at least 30 minutes of exercise that causes your heart to beat faster (aerobic exercise) most days of the week. Activities may include walking, swimming, or biking.  Work with your health care provider or diet and nutrition specialist (dietitian) to adjust your eating plan to your individual calorie needs. Reading food labels   Check food labels for the amount of sodium per serving. Choose foods with less than 5 percent of the Daily Value of sodium. Generally, foods with less than 300 mg of sodium per serving fit into this eating plan.  To find whole grains, look for the word "whole" as the first word in the ingredient list. Shopping  Buy products labeled as "low-sodium" or "no salt added."  Buy fresh foods. Avoid canned foods and premade or frozen meals. Cooking  Avoid adding salt when cooking. Use salt-free seasonings or herbs instead of table salt or sea salt. Check with your health care provider or pharmacist  before using salt substitutes.  Do not fry foods. Cook foods using healthy methods such as baking, boiling, grilling, and broiling instead.  Cook with heart-healthy oils, such as olive, canola, soybean, or sunflower oil. Meal planning  Eat a balanced diet that includes: ? 5 or more servings of fruits and vegetables each day. At each meal, try to fill half of your plate with fruits and vegetables. ? Up to 6-8 servings of whole grains each day. ? Less than 6 oz of lean meat, poultry, or fish each day. A 3-oz serving of meat is about the same size as a deck of cards. One egg equals 1 oz. ? 2 servings of low-fat dairy each day. ? A serving of nuts, seeds, or beans 5 times each week. ? Heart-healthy fats. Healthy fats called Omega-3 fatty acids are found in foods such as flaxseeds and coldwater fish, like sardines, salmon, and mackerel.  Limit how much you eat of the following: ? Canned or prepackaged foods. ? Food that is high in trans fat, such as fried foods. ? Food that is high in saturated fat, such as fatty meat. ? Sweets, desserts, sugary drinks, and other foods with added sugar. ? Full-fat dairy products.  Do not salt foods before eating.  Try to eat at least 2 vegetarian meals each week.  Eat more home-cooked food and less restaurant, buffet, and fast food.  When eating at a restaurant, ask that your food be prepared with  less salt or no salt, if possible. What foods are recommended? The items listed may not be a complete list. Talk with your dietitian about what dietary choices are best for you. Grains Whole-grain or whole-wheat bread. Whole-grain or whole-wheat pasta. Brown rice. [place]. Bulgur. Whole-grain and low-sodium cereals. Pita bread. Low-fat, low-sodium crackers. Whole-wheat flour tortillas. Vegetables Fresh or frozen vegetables (raw, steamed, roasted, or grilled). Low-sodium or reduced-sodium tomato and vegetable juice. Low-sodium or reduced-sodium tomato  sauce and tomato paste. Low-sodium or reduced-sodium canned vegetables. Fruits All fresh, dried, or frozen fruit. Canned fruit in natural juice (without added sugar). Meat and other protein foods Skinless chicken or [place]. Ground chicken or [place]. Pork with fat trimmed off. Fish and seafood. Egg whites. Dried beans, peas, or lentils. Unsalted nuts, nut butters, and seeds. Unsalted canned beans. Lean cuts of beef with fat trimmed off. Low-sodium, lean deli meat. Dairy Low-fat (1%) or fat-free (skim) milk. Fat-free, low-fat, or reduced-fat cheeses. Nonfat, low-sodium ricotta or cottage cheese. Low-fat or nonfat yogurt. Low-fat, low-sodium cheese. Fats and oils Soft margarine without trans fats. Vegetable oil. Low-fat, reduced-fat, or light mayonnaise and salad dressings (reduced-sodium). Canola, safflower, olive, soybean, and sunflower oils. Avocado. Seasoning and other foods Herbs. Spices. Seasoning mixes without salt. Unsalted popcorn and pretzels. Fat-free sweets. What foods are not recommended? The items listed may not be a complete list. Talk with your dietitian about what dietary choices are best for you. Grains Baked goods made with fat, such as croissants, muffins, or some breads. Dry pasta or rice meal packs. Vegetables Creamed or fried vegetables. Vegetables in a cheese sauce. Regular canned vegetables (not low-sodium or reduced-sodium). Regular canned tomato sauce and paste (not low-sodium or reduced-sodium). Regular tomato and vegetable juice (not low-sodium or reduced-sodium). Angie Fava. Olives. Fruits Canned fruit in a light or heavy syrup. Fried fruit. Fruit in cream or butter sauce. Meat and other protein foods Fatty cuts of meat. Ribs. Fried meat. Berniece Salines. Sausage. Bologna and other processed lunch meats. Salami. Fatback. Hotdogs. Bratwurst. Salted nuts and seeds. Canned beans with added salt. Canned or smoked fish. Whole eggs or egg yolks. Chicken or [place] with skin. Dairy Whole  or 2% milk, cream, and half-and-half. Whole or full-fat cream cheese. Whole-fat or sweetened yogurt. Full-fat cheese. Nondairy creamers. Whipped toppings. Processed cheese and cheese spreads. Fats and oils Butter. Stick margarine. Lard. Shortening. Ghee. Bacon fat. Tropical oils, such as coconut, palm kernel, or palm oil. Seasoning and other foods Salted popcorn and pretzels. Onion salt, garlic salt, seasoned salt, table salt, and sea salt. Worcestershire sauce. Tartar sauce. Barbecue sauce. Teriyaki sauce. Soy sauce, including reduced-sodium. Steak sauce. Canned and packaged gravies. Fish sauce. Oyster sauce. Cocktail sauce. Horseradish that you find on the shelf. Ketchup. Mustard. Meat flavorings and tenderizers. Bouillon cubes. Hot sauce and Tabasco sauce. Premade or packaged marinades. Premade or packaged taco seasonings. Relishes. Regular salad dressings. Where to find more information:  National Heart, Lung, and [place]: https://wilson-eaton.com/  American Heart Association: www.heart.org Summary  The DASH eating plan is a healthy eating plan that has been shown to reduce high blood pressure (hypertension). It may also reduce your risk for type 2 diabetes, heart disease, and stroke.  With the DASH eating plan, you should limit salt (sodium) intake to 2,300 mg a day. If you have hypertension, you may need to reduce your sodium intake to 1,500 mg a day.  When on the DASH eating plan, aim to eat more fresh fruits and vegetables, whole grains, lean proteins,  low-fat dairy, and heart-healthy fats.  Work with your health care provider or diet and nutrition specialist (dietitian) to adjust your eating plan to your individual calorie needs. This information is not intended to replace advice given to you by your health care provider. Make sure you discuss any questions you have with your health care provider. Document Revised: 11/12/2017 Document Reviewed: 11/23/2016 Elsevier Patient Education   [employer].  Hypertension, Adult High blood pressure (hypertension) is when the force of blood pumping through the arteries is too strong. The arteries are the blood vessels that carry blood from the heart throughout the body. Hypertension forces the heart to work harder to pump blood and may cause arteries to become narrow or stiff. Untreated or uncontrolled hypertension can cause a heart attack, heart failure, a stroke, kidney disease, and other problems. A blood pressure reading consists of a higher number over a lower number. Ideally, your blood pressure should be below 120/80. The first ("top") number is called the systolic pressure. It is a measure of the pressure in your arteries as your heart beats. The second ("bottom") number is called the diastolic pressure. It is a measure of the pressure in your arteries as the heart relaxes. What are the causes? The exact cause of this condition is not known. There are some conditions that result in or are related to high blood pressure. What increases the risk? Some risk factors for high blood pressure are under your control. The following factors may make you more likely to develop this condition:  Smoking.  Having type 2 diabetes mellitus, high cholesterol, or both.  Not getting enough exercise or physical activity.  Being overweight.  Having too much fat, sugar, calories, or salt (sodium) in your diet.  Drinking too much alcohol. Some risk factors for high blood pressure may be difficult or impossible to change. Some of these factors include:  Having chronic kidney disease.  Having a family history of high blood pressure.  Age. Risk increases with age.  Race. You may be at higher risk if you are African American.  Gender. Men are at higher risk than women before age 74. After age 26, women are at higher risk than men.  Having obstructive sleep apnea.  Stress. What are the signs or symptoms? High blood pressure may not  cause symptoms. Very high blood pressure (hypertensive crisis) may cause:  Headache.  Anxiety.  Shortness of breath.  Nosebleed.  Nausea and vomiting.  Vision changes.  Severe chest pain.  Seizures. How is this diagnosed? This condition is diagnosed by measuring your blood pressure while you are seated, with your arm resting on a flat surface, your legs uncrossed, and your feet flat on the floor. The cuff of the blood pressure monitor will be placed directly against the skin of your upper arm at the level of your heart. It should be measured at least twice using the same arm. Certain conditions can cause a difference in blood pressure between your right and left arms. Certain factors can cause blood pressure readings to be lower or higher than normal for a short period of time:  When your blood pressure is higher when you are in a health care provider's office than when you are at home, this is called white coat hypertension. Most people with this condition do not need medicines.  When your blood pressure is higher at home than when you are in a health care provider's office, this is called masked hypertension. Most people with  this condition may need medicines to control blood pressure. If you have a high blood pressure reading during one visit or you have normal blood pressure with other risk factors, you may be asked to:  Return on a different day to have your blood pressure checked again.  Monitor your blood pressure at home for 1 week or longer. If you are diagnosed with hypertension, you may have other blood or imaging tests to help your health care provider understand your overall risk for other conditions. How is this treated? This condition is treated by making healthy lifestyle changes, such as eating healthy foods, exercising more, and reducing your alcohol intake. Your health care provider may prescribe medicine if lifestyle changes are not enough to get your blood pressure  under control, and if:  Your systolic blood pressure is above 130.  Your diastolic blood pressure is above 80. Your personal target blood pressure may vary depending on your medical conditions, your age, and other factors. Follow these instructions at home: Eating and drinking   Eat a diet that is high in fiber and potassium, and low in sodium, added sugar, and fat. An example eating plan is called the DASH (Dietary Approaches to Stop Hypertension) diet. To eat this way: ? Eat plenty of fresh fruits and vegetables. Try to fill one half of your plate at each meal with fruits and vegetables. ? Eat whole grains, such as whole-wheat pasta, brown rice, or whole-grain bread. Fill about one fourth of your plate with whole grains. ? Eat or drink low-fat dairy products, such as skim milk or low-fat yogurt. ? Avoid fatty cuts of meat, processed or cured meats, and poultry with skin. Fill about one fourth of your plate with lean proteins, such as fish, chicken without skin, beans, eggs, or tofu. ? Avoid pre-made and processed foods. These tend to be higher in sodium, added sugar, and fat.  Reduce your daily sodium intake. Most people with hypertension should eat less than 1,500 mg of sodium a day.  Do not drink alcohol if: ? Your health care provider tells you not to drink. ? You are pregnant, may be pregnant, or are planning to become pregnant.  If you drink alcohol: ? Limit how much you use to:  0-1 drink a day for women.  0-2 drinks a day for men. ? Be aware of how much alcohol is in your drink. In the [place] one drink equals one 12 oz bottle of beer (355 mL), one 5 oz glass of wine (148 mL), or one 1 oz glass of hard liquor (44 mL). Lifestyle   Work with your health care provider to maintain a healthy body weight or to lose weight. Ask what an ideal weight is for you.  Get at least 30 minutes of exercise most days of the week. Activities may include walking, swimming, or  biking.  Include exercise to strengthen your muscles (resistance exercise), such as Pilates or lifting weights, as part of your weekly exercise routine. Try to do these types of exercises for 30 minutes at least 3 days a week.  Do not use any products that contain nicotine or tobacco, such as cigarettes, e-cigarettes, and chewing tobacco. If you need help quitting, ask your health care provider.  Monitor your blood pressure at home as told by your health care provider.  Keep all follow-up visits as told by your health care provider. This is important. Medicines  Take over-the-counter and prescription medicines only as told by your health care  provider. Follow directions carefully. Blood pressure medicines must be taken as prescribed.  Do not skip doses of blood pressure medicine. Doing this puts you at risk for problems and can make the medicine less effective.  Ask your health care provider about side effects or reactions to medicines that you should watch for. Contact a health care provider if you:  Think you are having a reaction to a medicine you are taking.  Have headaches that keep coming back (recurring).  Feel dizzy.  Have swelling in your ankles.  Have trouble with your vision. Get help right away if you:  Develop a severe headache or confusion.  Have unusual weakness or numbness.  Feel faint.  Have severe pain in your chest or abdomen.  Vomit repeatedly.  Have trouble breathing. Summary  Hypertension is when the force of blood pumping through your arteries is too strong. If this condition is not controlled, it may put you at risk for serious complications.  Your personal target blood pressure may vary depending on your medical conditions, your age, and other factors. For most people, a normal blood pressure is less than 120/80.  Hypertension is treated with lifestyle changes, medicines, or a combination of both. Lifestyle changes include losing weight, eating a  healthy, low-sodium diet, exercising more, and limiting alcohol. This information is not intended to replace advice given to you by your health care provider. Make sure you discuss any questions you have with your health care provider. Document Revised: 08/10/2018 Document Reviewed: 08/10/2018 Elsevier Patient Education  [employer].    If you have lab work done today you will be contacted with your lab results within the next 2 weeks.  If you have not heard from [place] then please contact us. The fastest way to get your results is to register for My Chart.   IF you received an x-ray today, you will receive an invoice from [facility]. Please contact [facility] at [phone] with questions or concerns regarding your invoice.   IF you received labwork today, you will receive an invoice from [place]. Please contact [facility] at [phone] with questions or concerns regarding your invoice.   Our billing staff will not be able to assist you with questions regarding bills from these companies.  You will be contacted with the lab results as soon as they are available. The fastest way to get your results is to activate your My Chart account. Instructions are located on the last page of this paperwork. If you have not heard from [place] regarding the results in 2 weeks, please contact this office.

## 2020-04-18 ENCOUNTER — Ambulatory Visit (HOSPITAL_COMMUNITY)
Admission: RE | Admit: 2020-04-18 | Discharge: 2020-04-18 | Disposition: A | Discharge status: Home / Self Care | Payer: BC Managed Care – PPO | Source: Ambulatory Visit | Attending: Physician Assistant | Admitting: Physician Assistant

## 2020-04-18 ENCOUNTER — Telehealth: Payer: Self-pay | Admitting: *Deleted

## 2020-04-18 ENCOUNTER — Other Ambulatory Visit: Payer: Self-pay | Admitting: Emergency Medicine

## 2020-04-18 DIAGNOSIS — R110 Nausea: Secondary | ICD-10-CM | POA: Diagnosis not present

## 2020-04-18 MED ORDER — OLMESARTAN MEDOXOMIL 40 MG PO TABS
40.0000 mg | ORAL_TABLET | Freq: Every day | ORAL | 1 refills | Status: DC
Start: 2020-04-18 — End: 2020-05-07

## 2020-04-18 NOTE — Telephone Encounter (Signed)
Left message in mobile voice mail, Dr Mitchel Honour has changed the blood pressure medication to Olmesartan 40 mg. This medication replaced the Lisinopril.

## 2020-04-19 ENCOUNTER — Other Ambulatory Visit: Payer: Self-pay

## 2020-04-19 ENCOUNTER — Other Ambulatory Visit: Payer: Self-pay | Admitting: Family Medicine

## 2020-04-19 DIAGNOSIS — I10 Essential (primary) hypertension: Secondary | ICD-10-CM

## 2020-04-19 MED ORDER — HYDRALAZINE HCL 10 MG PO TABS: 10.0000 mg | ORAL_TABLET | Freq: Three times a day (TID) | ORAL | 0 refills | Status: DC

## 2020-04-19 NOTE — Telephone Encounter (Signed)
Rx sent 

## 2020-04-24 ENCOUNTER — Ambulatory Visit: Payer: BC Managed Care – PPO | Admitting: Family Medicine

## 2020-04-24 ENCOUNTER — Other Ambulatory Visit: Payer: Self-pay

## 2020-04-24 ENCOUNTER — Encounter: Payer: Self-pay | Admitting: Family Medicine

## 2020-04-24 DIAGNOSIS — M7501 Adhesive capsulitis of right shoulder: Secondary | ICD-10-CM

## 2020-04-24 DIAGNOSIS — N943 Premenstrual tension syndrome: Secondary | ICD-10-CM | POA: Insufficient documentation

## 2020-04-24 MED ORDER — BACLOFEN 10 MG PO TABS: 5.0000 mg | ORAL_TABLET | Freq: Three times a day (TID) | ORAL | 3 refills | Status: DC | PRN

## 2020-04-24 NOTE — Progress Notes (Signed)
Office Visit Note   Patient: Heidi Herrera           Date of Birth: 1966/05/27           MRN: [id number] Visit Date: 04/24/2020 Requested by: Horald Pollen, MD [place],  [place] [address] PCP: Horald Pollen, MD  Subjective: Chief Complaint  Patient presents with  . Right Upper Arm - Pain    Pain in the upper arm since around Sept of last year. NKI. Radiates down the arm sometimes. No numbness/tingling in the arm/hand. Weakness in the arm with the radiating pain. Right-hand dominant.    HPI: She is here with right arm pain.  Symptoms started about 6 or 7 months ago, no injury.  Pain initially in the lateral aspect of her upper arm, then later affecting the entire arm at times.  She went to her PCP in February and had x-rays of the humerus which were normal, I reviewed them myself.  She has tried Tylenol with minimal improvement.  Denies any neck pain associated with this.  She cannot think of anything that could have caused it.  She is not diabetic.  She does have hypertension and is working with her PCP to adjust her medication regimen.  She works at home on a computer.  She does have polycystic kidney disease.              ROS:   All other systems were reviewed and are negative.  Objective: Vital Signs: There were no vitals taken for this visit.  Physical Exam:  General:  Alert and oriented, in no acute distress. Pulm:  Breathing unlabored. Psy:  Normal mood, congruent affect. Skin: No rash Right arm: Neck range of motion is full with negative Spurling's test.  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  Isometric rotator cuff strength is 5/5 throughout.  No palpable crepitus in the shoulder.  Biceps, triceps, wrist and intrinsic hand strength  are normal.  Imaging: No results found.  Assessment & Plan: 1.  Right arm pain, suspect due to adhesive capsulitis of the shoulder. -Home exercises given, physical therapy referral.  Baclofen as needed. -If not making progress in 1 to 2 months, consider intra-articular injection.     Procedures: No procedures performed  No notes on file     PMFS History: Patient Active Problem List   Diagnosis Date Noted  . Premenstrual tension syndrome 04/24/2020  . Polycystic kidney disease 11/29/2018  . Benign paroxysmal positional vertigo due to bilateral vestibular disorder 09/26/2018  . Body mass index (BMI) of 32.0-32.9 in adult 01/25/2018  . Amenorrhea 06/28/2017  . Mastodynia of left breast 06/28/2017  . CKD (chronic kidney disease) stage 3, GFR 30-59 ml/min (HCC) 04/15/2017  . Fibroid 12/01/2016  . Anosmia 05/01/2016  . Ear itch 05/01/2016  . Fibroid, uterine   . Hamartoma ([place]) 11/30/2014  . Essential hypertension 09/29/2014  . Chronic anemia 02/02/2014  . Hypertrophic scar 05/23/2012  . Seborrheic dermatitis 05/23/2012  . 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 VITAMIN D DEFICIENCY 12/18/2010  . HYPERLIPIDEMIA 12/18/2010  . NEPHROLITHIASIS 03/24/2010  . 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 Gastroesophageal reflux disease without esophagitis 08/27/2009  . FIBROADENOSIS, BREAST 02/10/2007   Past Medical History:  Diagnosis Date  . Acid  reflux   . Allergy   . Anemia   . Anxiety   . Fibroids   . GERD (gastroesophageal reflux disease)   . H. pylori infection   . Hypertension   . Kidney failure    stage 2  . Migraine   . Obesity   . Polycystic kidney disease   . Sebaceous cyst   . Sessile colonic polyp   . Vertigo     Family History  Problem Relation Age of Onset  . Hypertension Mother   . Obesity Mother   . Stroke Mother   . Diabetes Mother   . Hypertension Sister   . Hyperlipidemia Sister   . Obesity Sister   . Diabetes Sister   . Heart disease Sister   . Mental  illness Sister   . Glaucoma Sister   . Hypertension Brother   . Obesity Brother   . Stroke Brother   . Glaucoma Brother   . Mental illness Father   . Hypertension Father   . Cancer Brother   . Stroke Brother   . Hypertension Sister   . Mental illness Sister   . Glaucoma Maternal Uncle   . Cancer Sister        colon, dx in her late 17's  . Diabetes Sister     Past Surgical History:  Procedure Laterality Date  . IR GENERIC HISTORICAL  12/01/2016   IR ANGIOGRAM PELVIS SELECTIVE OR SUPRASELECTIVE 12/01/2016 Sandi Mariscal, MD [facility]  . IR GENERIC HISTORICAL  12/01/2016   IR US GUIDE VASC ACCESS RIGHT 12/01/2016 Sandi Mariscal, MD [facility]  . IR GENERIC HISTORICAL  12/01/2016   IR EMBO ARTERIAL NOT HEMORR HEMANG INC GUIDE ROADMAPPING 12/01/2016 Sandi Mariscal, MD [facility]  . IR GENERIC HISTORICAL  12/01/2016   IR ANGIOGRAM SELECTIVE EACH ADDITIONAL VESSEL 12/01/2016 Sandi Mariscal, MD [facility]  . IR GENERIC HISTORICAL  12/01/2016   IR ANGIOGRAM SELECTIVE EACH ADDITIONAL VESSEL 12/01/2016 Sandi Mariscal, MD [facility]  . IR GENERIC HISTORICAL  12/01/2016   IR ANGIOGRAM PELVIS SELECTIVE OR SUPRASELECTIVE 12/01/2016 Sandi Mariscal, MD [facility]  . IR GENERIC HISTORICAL  01/07/2017   IR RADIOLOGIST EVAL & MGMT 01/07/2017 Sandi Mariscal, MD [facility]  . IR RADIOLOGIST EVAL & MGMT  08/31/2017  . LOBECTOMY  2009  . MYOMECTOMY  2007  . UTERINE FIBROID EMBOLIZATION     12/01/2016   Social History   Occupational History    Comment: [employer]  Tobacco Use  . Smoking status: Never Smoker  . Smokeless tobacco: Never Used  Substance and Sexual Activity  . Alcohol use: Not Currently    Alcohol/week: 1.0 standard drinks    Types: 1 Glasses of wine per week    Comment: quit years ago  . Drug use: No  . Sexual activity: Not on file

## 2020-04-24 NOTE — Progress Notes (Signed)
Addendum: Reviewed and agree with assessment and management plan. Shelita Steptoe M, MD  

## 2020-04-29 ENCOUNTER — Telehealth: Payer: Self-pay | Admitting: Emergency Medicine

## 2020-04-29 NOTE — Telephone Encounter (Signed)
Pt called and wants provider or nurse to give her a call regarding her BP medication. Pt stated she doesn't believe it is helping her and would like to talk to provider regarding this. [phone] Please advise.

## 2020-04-30 ENCOUNTER — Other Ambulatory Visit: Payer: Self-pay | Admitting: Emergency Medicine

## 2020-04-30 DIAGNOSIS — I10 Essential (primary) hypertension: Secondary | ICD-10-CM

## 2020-04-30 MED ORDER — HYDROCHLOROTHIAZIDE 25 MG PO TABS: 25.0000 mg | ORAL_TABLET | Freq: Every day | ORAL | 3 refills | Status: DC

## 2020-04-30 NOTE — Telephone Encounter (Signed)
LVM and sent a message thru MyChart to let pt know that Dr. Mitchel Honour will he sending Hydrochlorothiazide 25mg  for her bc she is intolerance/allergy to many other medications.

## 2020-04-30 NOTE — Telephone Encounter (Signed)
Yes, she stated that she is taking both medications. She said she takes the hydralazine in the afternoon/night.

## 2020-04-30 NOTE — Telephone Encounter (Signed)
Yes

## 2020-04-30 NOTE — Telephone Encounter (Signed)
Is she taking both losartan and hydralazine as prescribed?  Please find out.  Thanks.

## 2020-04-30 NOTE — Telephone Encounter (Signed)
Will you be sending the hydrochlorothiazide in for her to start?

## 2020-04-30 NOTE — Telephone Encounter (Signed)
Spoke with pt and she stated that she feels that her BP medication is not working. She said that yesterday morning her BP was 176/111 and this morning her BP is 154/107. She said that even before she gets up in the mornings she feels like her BP is already running high. She is not sleeping well bc of it.   Please Advise.

## 2020-04-30 NOTE — Telephone Encounter (Signed)
She is supposed to take hydralazine 3 times a day, and losartan once a day.  Sounds like she will need to get back to hydrochlorothiazide 25 mg daily as she is intolerant/allergic to many other medications.  Thanks.

## 2020-04-30 NOTE — Telephone Encounter (Signed)
Thank you. Will notify pt of this.

## 2020-05-01 ENCOUNTER — Ambulatory Visit: Payer: BC Managed Care – PPO | Admitting: Emergency Medicine

## 2020-05-01 ENCOUNTER — Telehealth: Payer: Self-pay | Admitting: Emergency Medicine

## 2020-05-01 NOTE — Telephone Encounter (Signed)
Spoke with pt and she wanted to know if hydrochlorothiazide protects her kidneys as well.  Please advise.

## 2020-05-01 NOTE — Telephone Encounter (Signed)
Please Advise

## 2020-05-01 NOTE — Telephone Encounter (Signed)
Thank you :)

## 2020-05-01 NOTE — Telephone Encounter (Signed)
Yes

## 2020-05-01 NOTE — Telephone Encounter (Signed)
Pt called asking for Heidi Herrera regarding the message sent yesterday 04/30/20 about her starting a new medication. Pt would like a call back regarding the message.

## 2020-05-02 ENCOUNTER — Other Ambulatory Visit: Payer: Self-pay | Admitting: Family Medicine

## 2020-05-02 DIAGNOSIS — M7501 Adhesive capsulitis of right shoulder: Secondary | ICD-10-CM

## 2020-05-07 ENCOUNTER — Other Ambulatory Visit: Payer: Self-pay

## 2020-05-07 ENCOUNTER — Encounter: Payer: Self-pay | Admitting: Emergency Medicine

## 2020-05-07 ENCOUNTER — Ambulatory Visit: Payer: BC Managed Care – PPO | Admitting: Emergency Medicine

## 2020-05-07 VITALS — BP 144/84 | HR 86 | Temp 98.2°F | Resp 16 | Ht 65.5 in | Wt 186.0 lb

## 2020-05-07 DIAGNOSIS — I10 Essential (primary) hypertension: Secondary | ICD-10-CM | POA: Diagnosis not present

## 2020-05-07 MED ORDER — LISINOPRIL-HYDROCHLOROTHIAZIDE 20-12.5 MG PO TABS: 2.0000 | ORAL_TABLET | Freq: Every day | ORAL | 3 refills | Status: DC

## 2020-05-07 NOTE — Patient Instructions (Addendum)
   If you have lab work done today you will be contacted with your lab results within the next 2 weeks.  If you have not heard from us then please contact us. The fastest way to get your results is to register for My Chart.   IF you received an x-ray today, you will receive an invoice from [facility]. Please contact [facility] at [phone] with questions or concerns regarding your invoice.   IF you received labwork today, you will receive an invoice from [facility]. Please contact [facility] at [phone] with questions or concerns regarding your invoice.   Our billing staff will not be able to assist you with questions regarding bills from these companies.  You will be contacted with the lab results as soon as they are available. The fastest way to get your results is to activate your My Chart account. Instructions are located on the last page of this paperwork. If you have not heard from us regarding the results in 2 weeks, please contact this office.      Hypertension, Adult High blood pressure (hypertension) is when the force of blood pumping through the arteries is too strong. The arteries are the blood vessels that carry blood from the heart throughout the body. Hypertension forces the heart to work harder to pump blood and may cause arteries to become narrow or stiff. Untreated or uncontrolled hypertension can cause a heart attack, heart failure, a stroke, kidney disease, and other problems. A blood pressure reading consists of a higher number over a lower number. Ideally, your blood pressure should be below 120/80. The first ("top") number is called the systolic pressure. It is a measure of the pressure in your arteries as your heart beats. The second ("bottom") number is called the diastolic pressure. It is a measure of the pressure in your arteries as the heart relaxes. What are the causes? The exact cause of this condition is not known. There are some conditions  that result in or are related to high blood pressure. What increases the risk? Some risk factors for high blood pressure are under your control. The following factors may make you more likely to develop this condition:  Smoking.  Having type 2 diabetes mellitus, high cholesterol, or both.  Not getting enough exercise or physical activity.  Being overweight.  Having too much fat, sugar, calories, or salt (sodium) in your diet.  Drinking too much alcohol. Some risk factors for high blood pressure may be difficult or impossible to change. Some of these factors include:  Having chronic kidney disease.  Having a family history of high blood pressure.  Age. Risk increases with age.  Race. You may be at higher risk if you are African American.  Gender. Men are at higher risk than women before age 45. After age 65, women are at higher risk than men.  Having obstructive sleep apnea.  Stress. What are the signs or symptoms? High blood pressure may not cause symptoms. Very high blood pressure (hypertensive crisis) may cause:  Headache.  Anxiety.  Shortness of breath.  Nosebleed.  Nausea and vomiting.  Vision changes.  Severe chest pain.  Seizures. How is this diagnosed? This condition is diagnosed by measuring your blood pressure while you are seated, with your arm resting on a flat surface, your legs uncrossed, and your feet flat on the floor. The cuff of the blood pressure monitor will be placed directly against the skin of your upper arm at the level of your   heart. It should be measured at least twice using the same arm. Certain conditions can cause a difference in blood pressure between your right and left arms. Certain factors can cause blood pressure readings to be lower or higher than normal for a short period of time:  When your blood pressure is higher when you are in a health care provider's office than when you are at home, this is called white coat hypertension.  Most people with this condition do not need medicines.  When your blood pressure is higher at home than when you are in a health care provider's office, this is called masked hypertension. Most people with this condition may need medicines to control blood pressure. If you have a high blood pressure reading during one visit or you have normal blood pressure with other risk factors, you may be asked to:  Return on a different day to have your blood pressure checked again.  Monitor your blood pressure at home for 1 week or longer. If you are diagnosed with hypertension, you may have other blood or imaging tests to help your health care provider understand your overall risk for other conditions. How is this treated? This condition is treated by making healthy lifestyle changes, such as eating healthy foods, exercising more, and reducing your alcohol intake. Your health care provider may prescribe medicine if lifestyle changes are not enough to get your blood pressure under control, and if:  Your systolic blood pressure is above 130.  Your diastolic blood pressure is above 80. Your personal target blood pressure may vary depending on your medical conditions, your age, and other factors. Follow these instructions at home: Eating and drinking   Eat a diet that is high in fiber and potassium, and low in sodium, added sugar, and fat. An example eating plan is called the DASH (Dietary Approaches to Stop Hypertension) diet. To eat this way: ? Eat plenty of fresh fruits and vegetables. Try to fill one half of your plate at each meal with fruits and vegetables. ? Eat whole grains, such as whole-wheat pasta, brown rice, or whole-grain bread. Fill about one fourth of your plate with whole grains. ? Eat or drink low-fat dairy products, such as skim milk or low-fat yogurt. ? Avoid fatty cuts of meat, processed or cured meats, and poultry with skin. Fill about one fourth of your plate with lean proteins, such  as fish, chicken without skin, beans, eggs, or tofu. ? Avoid pre-made and processed foods. These tend to be higher in sodium, added sugar, and fat.  Reduce your daily sodium intake. Most people with hypertension should eat less than 1,500 mg of sodium a day.  Do not drink alcohol if: ? Your health care provider tells you not to drink. ? You are pregnant, may be pregnant, or are planning to become pregnant.  If you drink alcohol: ? Limit how much you use to:  0-1 drink a day for women.  0-2 drinks a day for men. ? Be aware of how much alcohol is in your drink. In the [place] one drink equals one 12 oz bottle of beer (355 mL), one 5 oz glass of wine (148 mL), or one 1 oz glass of hard liquor (44 mL). Lifestyle   Work with your health care provider to maintain a healthy body weight or to lose weight. Ask what an ideal weight is for you.  Get at least 30 minutes of exercise most days of the week. Activities may include walking, swimming,   or biking.  Include exercise to strengthen your muscles (resistance exercise), such as Pilates or lifting weights, as part of your weekly exercise routine. Try to do these types of exercises for 30 minutes at least 3 days a week.  Do not use any products that contain nicotine or tobacco, such as cigarettes, e-cigarettes, and chewing tobacco. If you need help quitting, ask your health care provider.  Monitor your blood pressure at home as told by your health care provider.  Keep all follow-up visits as told by your health care provider. This is important. Medicines  Take over-the-counter and prescription medicines only as told by your health care provider. Follow directions carefully. Blood pressure medicines must be taken as prescribed.  Do not skip doses of blood pressure medicine. Doing this puts you at risk for problems and can make the medicine less effective.  Ask your health care provider about side effects or reactions to medicines that you  should watch for. Contact a health care provider if you:  Think you are having a reaction to a medicine you are taking.  Have headaches that keep coming back (recurring).  Feel dizzy.  Have swelling in your ankles.  Have trouble with your vision. Get help right away if you:  Develop a severe headache or confusion.  Have unusual weakness or numbness.  Feel faint.  Have severe pain in your chest or abdomen.  Vomit repeatedly.  Have trouble breathing. Summary  Hypertension is when the force of blood pumping through your arteries is too strong. If this condition is not controlled, it may put you at risk for serious complications.  Your personal target blood pressure may vary depending on your medical conditions, your age, and other factors. For most people, a normal blood pressure is less than 120/80.  Hypertension is treated with lifestyle changes, medicines, or a combination of both. Lifestyle changes include losing weight, eating a healthy, low-sodium diet, exercising more, and limiting alcohol. This information is not intended to replace advice given to you by your health care provider. Make sure you discuss any questions you have with your health care provider. Document Revised: 08/10/2018 Document Reviewed: 08/10/2018 [employer]vier Patient Education  2020 [employer]  

## 2020-05-07 NOTE — Progress Notes (Signed)
Heidi Herrera 54 y.o.   Chief Complaint  Patient presents with  . Hypertension    follow up blood pressure check    HISTORY OF PRESENT ILLNESS: This is a 54 y.o. female here for follow-up on blood pressure.  Presently only taking hydrochlorothiazide 25 mg. She stopped losartan and hydralazine.  Miscommunication.  Blood pressure readings at home almost exclusively in the morning before taking medications persistently high. BP Readings from Last 3 Encounters:  05/07/20 (!) 144/84  04/17/20 (!) 155/85  04/12/20 130/80    HPI   Prior to Admission medications   Medication Sig Start Date End Date Taking? Authorizing Provider  baclofen (LIORESAL) 10 MG tablet Take 0.5-1 tablets (5-10 mg total) by mouth 3 (three) times daily as needed for muscle spasms. 04/24/20  Yes Hilts, Legrand Como, MD  Biotin 1000 MCG tablet Take 1,000 mcg by mouth daily.    Yes [provider]  calcium-vitamin D (OSCAL WITH D) 500-200 MG-UNIT per tablet Take 1 tablet by mouth 2 (two) times daily. Calcium 1000 mg with Magnesium 400 mg and zinc 15 mg, taking 1 capsule 2 times daily   Yes [provider]  cetirizine (ZYRTEC) 10 MG tablet Take 1 tablet by mouth once daily 03/12/20  Yes Danyele Smejkal, [place], MD  Cyanocobalamin (VITAMIN B 12 PO) Take 1 tablet by mouth daily.   Yes [provider]  hydrochlorothiazide (HYDRODIURIL) 25 MG tablet Take 1 tablet (25 mg total) by mouth daily. 04/30/20  Yes Horald Pollen, MD  IFEREX 150 150 MG capsule Take 1 capsule by mouth twice daily 07/05/19  Yes Afreen Siebels, Ines Bloomer, MD  omeprazole (PRILOSEC) 40 MG capsule Take 1 capsule (40 mg total) by mouth 2 (two) times daily. 04/12/20  Yes Levin Erp, PA  Probiotic Product (ALIGN) 4 MG CAPS Take 1 capsule (4 mg total) by mouth daily. 04/12/20  Yes Levin Erp, PA  hydrALAZINE (APRESOLINE) 10 MG tablet Take 1 tablet (10 mg total) by mouth 3 (three) times daily. Patient not taking:  Reported on 05/07/2020 04/19/20   Horald Pollen, MD  olmesartan (BENICAR) 40 MG tablet Take 1 tablet (40 mg total) by mouth daily. Patient not taking: Reported on 05/07/2020 04/18/20 07/17/20  Horald Pollen, MD    Allergies  Allergen Reactions  . Bystolic [Nebivolol Hcl] Anaphylaxis, Itching and Swelling  . Biaxin [Clarithromycin]     The category of the Biaxin family.  Also allergic to water chestnuts.  . Erythromycin Base Rash    Patient Active Problem List   Diagnosis Date Noted  . Premenstrual tension syndrome 04/24/2020  . Polycystic kidney disease 11/29/2018  . Benign paroxysmal positional vertigo due to bilateral vestibular disorder 09/26/2018  . Body mass index (BMI) of 32.0-32.9 in adult 01/25/2018  . Amenorrhea 06/28/2017  . Mastodynia of left breast 06/28/2017  . CKD (chronic kidney disease) stage 3, GFR 30-59 ml/min (HCC) 04/15/2017  . Fibroid 12/01/2016  . Anosmia 05/01/2016  . Ear itch 05/01/2016  . Fibroid, uterine   . Hamartoma ([place]) 11/30/2014  . Essential hypertension 09/29/2014  . Chronic anemia 02/02/2014  . Hypertrophic scar 05/23/2012  . Seborrheic dermatitis 05/23/2012  . 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 VITAMIN D DEFICIENCY 12/18/2010  . HYPERLIPIDEMIA 12/18/2010  . NEPHROLITHIASIS 03/24/2010  . 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 Gastroesophageal reflux disease without esophagitis 08/27/2009  . FIBROADENOSIS, BREAST 02/10/2007    Past Medical History:  Diagnosis Date  . Acid reflux   . Allergy   .  Anemia   . Anxiety   . Fibroids   . GERD (gastroesophageal reflux disease)   . H. pylori infection   . Hypertension   . Kidney failure    stage 2  . Migraine   . Obesity   . Polycystic kidney disease   . Sebaceous cyst   . Sessile colonic polyp   . Vertigo     Past Surgical History:  Procedure Laterality Date  . IR GENERIC HISTORICAL  12/01/2016   IR ANGIOGRAM PELVIS SELECTIVE OR SUPRASELECTIVE 12/01/2016 Sandi Mariscal, MD [facility]    . IR GENERIC HISTORICAL  12/01/2016   IR US GUIDE VASC ACCESS RIGHT 12/01/2016 Sandi Mariscal, MD [facility]  . IR GENERIC HISTORICAL  12/01/2016   IR EMBO ARTERIAL NOT HEMORR HEMANG INC GUIDE ROADMAPPING 12/01/2016 Sandi Mariscal, MD [facility]  . IR GENERIC HISTORICAL  12/01/2016   IR ANGIOGRAM SELECTIVE EACH ADDITIONAL VESSEL 12/01/2016 Sandi Mariscal, MD [facility]  . IR GENERIC HISTORICAL  12/01/2016   IR ANGIOGRAM SELECTIVE EACH ADDITIONAL VESSEL 12/01/2016 Sandi Mariscal, MD [facility]  . IR GENERIC HISTORICAL  12/01/2016   IR ANGIOGRAM PELVIS SELECTIVE OR SUPRASELECTIVE 12/01/2016 Sandi Mariscal, MD [facility]  . IR GENERIC HISTORICAL  01/07/2017   IR RADIOLOGIST EVAL & MGMT 01/07/2017 Sandi Mariscal, MD [facility]  . IR RADIOLOGIST EVAL & MGMT  08/31/2017  . LOBECTOMY  2009  . MYOMECTOMY  2007  . UTERINE FIBROID EMBOLIZATION     12/01/2016    Social History   Socioeconomic History  . Marital status: Divorced    Spouse name: Not on file  . Number of children: 1  . Years of education: Not on file  . Highest education level: Master's degree (e.g., MA, MS, MEng, MEd, MSW, MBA)  Occupational History    Comment: [employer]  Tobacco Use  . Smoking status: Never Smoker  . Smokeless tobacco: Never Used  Substance and Sexual Activity  . Alcohol use: Not Currently    Alcohol/week: 1.0 standard drinks    Types: 1 Glasses of wine per week    Comment: quit years ago  . Drug use: No  . Sexual activity: Not on file  Other Topics Concern  . Not on file  Social History Narrative   Lives alone   No caffiene   Social Determinants of Health   Financial Resource Strain:   . Difficulty of Paying Living Expenses:   Food Insecurity:   . Worried About Charity fundraiser in the Last Year:   . Arboriculturist in the Last Year:   Transportation Needs:   . Film/video editor (Medical):   Marland Kitchen Lack of Transportation (Non-Medical):   Physical Activity:   . Days of Exercise per Week:   .  Minutes of Exercise per Session:   Stress:   . Feeling of Stress :   Social Connections:   . Frequency of Communication with Friends and Family:   . Frequency of Social Gatherings with Friends and Family:   . Attends Religious Services:   . Active Member of Clubs or Organizations:   . Attends Archivist Meetings:   Marland Kitchen Marital Status:   Intimate Partner Violence:   . Fear of Current or Ex-Partner:   . Emotionally Abused:   Marland Kitchen Physically Abused:   . Sexually Abused:     Family History  Problem Relation Age of Onset  . Hypertension Mother   . Obesity Mother   . Stroke  Mother   . Diabetes Mother   . Hypertension Sister   . Hyperlipidemia Sister   . Obesity Sister   . Diabetes Sister   . Heart disease Sister   . Mental illness Sister   . Glaucoma Sister   . Hypertension Brother   . Obesity Brother   . Stroke Brother   . Glaucoma Brother   . Mental illness Father   . Hypertension Father   . Cancer Brother   . Stroke Brother   . Hypertension Sister   . Mental illness Sister   . Glaucoma Maternal Uncle   . Cancer Sister        colon, dx in her late 85's  . Diabetes Sister      Review of Systems  Constitutional: Negative.  Negative for chills and fever.  HENT: Negative.  Negative for congestion and sore throat.   Respiratory: Negative.  Negative for cough and shortness of breath.   Cardiovascular: Negative.  Negative for chest pain, palpitations and leg swelling.  Gastrointestinal: Negative for abdominal pain, blood in stool, diarrhea, nausea and vomiting.  Genitourinary: Negative.  Negative for dysuria.  Musculoskeletal: Negative.  Negative for back pain, myalgias and neck pain.  Skin: Negative.  Negative for rash.  Neurological: Negative.  Negative for dizziness and headaches.  All other systems reviewed and are negative.  Today's Vitals   05/07/20 1035  BP: (!) 144/84  Pulse: 86  Resp: 16  Temp: 98.2 F (36.8 C)  TempSrc: Temporal  SpO2: 97%    Weight: 186 lb (84.4 kg)  Height: 5' 5.5" (1.664 m)   Body mass index is 30.48 kg/m.   Physical Exam Vitals reviewed.  Constitutional:      Appearance: Normal appearance.  HENT:     Head: Normocephalic.  Eyes:     Extraocular Movements: Extraocular movements intact.     Conjunctiva/sclera: Conjunctivae normal.     Pupils: Pupils are equal, round, and reactive to light.  Cardiovascular:     Rate and Rhythm: Normal rate and regular rhythm.     Pulses: Normal pulses.     Heart sounds: Normal heart sounds.  Pulmonary:     Effort: Pulmonary effort is normal.     Breath sounds: Normal breath sounds.  Musculoskeletal:     Cervical back: Normal range of motion and neck supple.     Right lower leg: No edema.     Left lower leg: No edema.     Comments: Lower extremities/feet: Warm to touch.  No erythema or bruising.  No open lesions.  No stasis changes.  Excellent peripheral pulses with very good cap refill.  Sensation intact.  Skin:    General: Skin is warm and dry.     Capillary Refill: Capillary refill takes less than 2 seconds.  Neurological:     General: No focal deficit present.     Mental Status: She is alert and oriented to person, place, and time.  Psychiatric:        Mood and Affect: Mood normal.        Behavior: Behavior normal.    A total of 30 minutes was spent with the patient, greater than 50% of which was in counseling/coordination of care regarding hypertension and cardiovascular risks associated with this condition, management and review of all medications including changes, diet and nutrition, review of most recent blood work results, review of most recent office visit notes, prognosis and need for follow-up.   ASSESSMENT & PLAN: Essential hypertension Blood  pressure readings elevated at home.  Advised to start taking lisinopril-hydrochlorothiazide 20-12.5 mg, 2 tablets daily.  Stop losartan and stop hydralazine.  Continue monitoring blood pressure at home.   Diet and nutrition discussed with patient.  Follow-up in 3 months.  Heidi Herrera was seen today for hypertension.  Diagnoses and all orders for this visit:  Essential hypertension -     Comprehensive metabolic panel -     lisinopril-hydrochlorothiazide (ZESTORETIC) 20-12.5 MG tablet; Take 2 tablets by mouth daily.    Patient Instructions       If you have lab work done today you will be contacted with your lab results within the next 2 weeks.  If you have not heard from [place] then please contact us. The fastest way to get your results is to register for My Chart.   IF you received an x-ray today, you will receive an invoice from [facility]. Please contact [facility] at [phone] with questions or concerns regarding your invoice.   IF you received labwork today, you will receive an invoice from [facility]. Please contact [facility] at [phone] with questions or concerns regarding your invoice.   Our billing staff will not be able to assist you with questions regarding bills from these companies.  You will be contacted with the lab results as soon as they are available. The fastest way to get your results is to activate your My Chart account. Instructions are located on the last page of this paperwork. If you have not heard from [place] regarding the results in 2 weeks, please contact this office.     Hypertension, Adult High blood pressure (hypertension) is when the force of blood pumping through the arteries is too strong. The arteries are the blood vessels that carry blood from the heart throughout the body. Hypertension forces the heart to work harder to pump blood and may cause arteries to become narrow or stiff. Untreated or uncontrolled hypertension can cause a heart attack, heart failure, a stroke, kidney disease, and other problems. A blood pressure reading consists of a higher number over a lower number. Ideally, your blood pressure should be below 120/80. The first  ("top") number is called the systolic pressure. It is a measure of the pressure in your arteries as your heart beats. The second ("bottom") number is called the diastolic pressure. It is a measure of the pressure in your arteries as the heart relaxes. What are the causes? The exact cause of this condition is not known. There are some conditions that result in or are related to high blood pressure. What increases the risk? Some risk factors for high blood pressure are under your control. The following factors may make you more likely to develop this condition:  Smoking.  Having type 2 diabetes mellitus, high cholesterol, or both.  Not getting enough exercise or physical activity.  Being overweight.  Having too much fat, sugar, calories, or salt (sodium) in your diet.  Drinking too much alcohol. Some risk factors for high blood pressure may be difficult or impossible to change. Some of these factors include:  Having chronic kidney disease.  Having a family history of high blood pressure.  Age. Risk increases with age.  Race. You may be at higher risk if you are African American.  Gender. Men are at higher risk than women before age 5. After age 51, women are at higher risk than men.  Having obstructive sleep apnea.  Stress. What are the signs or symptoms? High blood pressure may not  cause symptoms. Very high blood pressure (hypertensive crisis) may cause:  Headache.  Anxiety.  Shortness of breath.  Nosebleed.  Nausea and vomiting.  Vision changes.  Severe chest pain.  Seizures. How is this diagnosed? This condition is diagnosed by measuring your blood pressure while you are seated, with your arm resting on a flat surface, your legs uncrossed, and your feet flat on the floor. The cuff of the blood pressure monitor will be placed directly against the skin of your upper arm at the level of your heart. It should be measured at least twice using the same arm. Certain  conditions can cause a difference in blood pressure between your right and left arms. Certain factors can cause blood pressure readings to be lower or higher than normal for a short period of time:  When your blood pressure is higher when you are in a health care provider's office than when you are at home, this is called white coat hypertension. Most people with this condition do not need medicines.  When your blood pressure is higher at home than when you are in a health care provider's office, this is called masked hypertension. Most people with this condition may need medicines to control blood pressure. If you have a high blood pressure reading during one visit or you have normal blood pressure with other risk factors, you may be asked to:  Return on a different day to have your blood pressure checked again.  Monitor your blood pressure at home for 1 week or longer. If you are diagnosed with hypertension, you may have other blood or imaging tests to help your health care provider understand your overall risk for other conditions. How is this treated? This condition is treated by making healthy lifestyle changes, such as eating healthy foods, exercising more, and reducing your alcohol intake. Your health care provider may prescribe medicine if lifestyle changes are not enough to get your blood pressure under control, and if:  Your systolic blood pressure is above 130.  Your diastolic blood pressure is above 80. Your personal target blood pressure may vary depending on your medical conditions, your age, and other factors. Follow these instructions at home: Eating and drinking   Eat a diet that is high in fiber and potassium, and low in sodium, added sugar, and fat. An example eating plan is called the DASH (Dietary Approaches to Stop Hypertension) diet. To eat this way: ? Eat plenty of fresh fruits and vegetables. Try to fill one half of your plate at each meal with fruits and  vegetables. ? Eat whole grains, such as whole-wheat pasta, brown rice, or whole-grain bread. Fill about one fourth of your plate with whole grains. ? Eat or drink low-fat dairy products, such as skim milk or low-fat yogurt. ? Avoid fatty cuts of meat, processed or cured meats, and poultry with skin. Fill about one fourth of your plate with lean proteins, such as fish, chicken without skin, beans, eggs, or tofu. ? Avoid pre-made and processed foods. These tend to be higher in sodium, added sugar, and fat.  Reduce your daily sodium intake. Most people with hypertension should eat less than 1,500 mg of sodium a day.  Do not drink alcohol if: ? Your health care provider tells you not to drink. ? You are pregnant, may be pregnant, or are planning to become pregnant.  If you drink alcohol: ? Limit how much you use to:  0-1 drink a day for women.  0-2 drinks a  day for men. ? Be aware of how much alcohol is in your drink. In the [place] one drink equals one 12 oz bottle of beer (355 mL), one 5 oz glass of wine (148 mL), or one 1 oz glass of hard liquor (44 mL). Lifestyle   Work with your health care provider to maintain a healthy body weight or to lose weight. Ask what an ideal weight is for you.  Get at least 30 minutes of exercise most days of the week. Activities may include walking, swimming, or biking.  Include exercise to strengthen your muscles (resistance exercise), such as Pilates or lifting weights, as part of your weekly exercise routine. Try to do these types of exercises for 30 minutes at least 3 days a week.  Do not use any products that contain nicotine or tobacco, such as cigarettes, e-cigarettes, and chewing tobacco. If you need help quitting, ask your health care provider.  Monitor your blood pressure at home as told by your health care provider.  Keep all follow-up visits as told by your health care provider. This is important. Medicines  Take over-the-counter and  prescription medicines only as told by your health care provider. Follow directions carefully. Blood pressure medicines must be taken as prescribed.  Do not skip doses of blood pressure medicine. Doing this puts you at risk for problems and can make the medicine less effective.  Ask your health care provider about side effects or reactions to medicines that you should watch for. Contact a health care provider if you:  Think you are having a reaction to a medicine you are taking.  Have headaches that keep coming back (recurring).  Feel dizzy.  Have swelling in your ankles.  Have trouble with your vision. Get help right away if you:  Develop a severe headache or confusion.  Have unusual weakness or numbness.  Feel faint.  Have severe pain in your chest or abdomen.  Vomit repeatedly.  Have trouble breathing. Summary  Hypertension is when the force of blood pumping through your arteries is too strong. If this condition is not controlled, it may put you at risk for serious complications.  Your personal target blood pressure may vary depending on your medical conditions, your age, and other factors. For most people, a normal blood pressure is less than 120/80.  Hypertension is treated with lifestyle changes, medicines, or a combination of both. Lifestyle changes include losing weight, eating a healthy, low-sodium diet, exercising more, and limiting alcohol. This information is not intended to replace advice given to you by your health care provider. Make sure you discuss any questions you have with your health care provider. Document Revised: 08/10/2018 Document Reviewed: 08/10/2018 Elsevier Patient Education  2020 Elsevier Inc.      Agustina Caroli, MD [facility]

## 2020-05-07 NOTE — Assessment & Plan Note (Signed)
Blood pressure readings elevated at home.  Advised to start taking lisinopril-hydrochlorothiazide 20-12.5 mg, 2 tablets daily.  Stop losartan and stop hydralazine.  Continue monitoring blood pressure at home.  Diet and nutrition discussed with patient.  Follow-up in 3 months.

## 2020-05-08 ENCOUNTER — Encounter: Payer: Self-pay | Admitting: Emergency Medicine

## 2020-05-08 LAB — COMPREHENSIVE METABOLIC PANEL
ALT: 13 IU/L (ref 0–32)
AST: 19 IU/L (ref 0–40)
Albumin/Globulin Ratio: 1.3 (ref 1.2–2.2)
Albumin: 4.5 g/dL (ref 3.8–4.9)
Alkaline Phosphatase: 64 IU/L (ref 48–121)
BUN/Creatinine Ratio: 11 (ref 9–23)
BUN: 14 mg/dL (ref 6–24)
Bilirubin Total: 0.5 mg/dL (ref 0.0–1.2)
CO2: 26 mmol/L (ref 20–29)
Calcium: 9.9 mg/dL (ref 8.7–10.2)
Chloride: 100 mmol/L (ref 96–106)
Creatinine, Ser: 1.25 mg/dL — ABNORMAL HIGH (ref 0.57–1.00)
GFR calc Af Amer: 56 mL/min/{1.73_m2} — ABNORMAL LOW (ref >59)
GFR calc non Af Amer: 49 mL/min/{1.73_m2} — ABNORMAL LOW (ref >59)
Globulin, Total: 3.6 g/dL (ref 1.5–4.5)
Glucose: 83 mg/dL (ref 65–99)
Potassium: 3.5 mmol/L (ref 3.5–5.2)
Sodium: 141 mmol/L (ref 134–144)
Total Protein: 8.1 g/dL (ref 6.0–8.5)

## 2020-05-17 ENCOUNTER — Ambulatory Visit: Payer: BC Managed Care – PPO | Admitting: Physician Assistant

## 2020-05-21 ENCOUNTER — Other Ambulatory Visit: Payer: Self-pay | Admitting: Emergency Medicine

## 2020-05-21 ENCOUNTER — Encounter: Payer: Self-pay | Admitting: Emergency Medicine

## 2020-05-21 DIAGNOSIS — I10 Essential (primary) hypertension: Secondary | ICD-10-CM

## 2020-05-21 MED ORDER — LISINOPRIL 20 MG PO TABS: 20.0000 mg | ORAL_TABLET | Freq: Every day | ORAL | 3 refills | Status: DC

## 2020-05-21 MED ORDER — LISINOPRIL-HYDROCHLOROTHIAZIDE 20-25 MG PO TABS: 1.0000 | ORAL_TABLET | Freq: Every day | ORAL | 3 refills | Status: DC

## 2020-05-21 NOTE — Telephone Encounter (Signed)
Prescriptions sent.  Thanks.

## 2020-05-22 ENCOUNTER — Ambulatory Visit: Payer: BC Managed Care – PPO | Admitting: Emergency Medicine

## 2020-06-20 ENCOUNTER — Other Ambulatory Visit: Payer: Self-pay

## 2020-06-20 ENCOUNTER — Ambulatory Visit: Payer: BC Managed Care – PPO | Attending: Family Medicine | Admitting: Physical Therapy

## 2020-07-12 ENCOUNTER — Other Ambulatory Visit (INDEPENDENT_AMBULATORY_CARE_PROVIDER_SITE_OTHER): Payer: BC Managed Care – PPO

## 2020-07-12 ENCOUNTER — Encounter: Payer: Self-pay | Admitting: Internal Medicine

## 2020-07-12 ENCOUNTER — Ambulatory Visit: Payer: BC Managed Care – PPO | Admitting: Internal Medicine

## 2020-07-12 VITALS — BP 114/76 | HR 71 | Ht 64.0 in | Wt 194.0 lb

## 2020-07-12 DIAGNOSIS — R110 Nausea: Secondary | ICD-10-CM

## 2020-07-12 DIAGNOSIS — R6881 Early satiety: Secondary | ICD-10-CM

## 2020-07-12 DIAGNOSIS — R109 Unspecified abdominal pain: Secondary | ICD-10-CM | POA: Diagnosis not present

## 2020-07-12 DIAGNOSIS — R140 Abdominal distension (gaseous): Secondary | ICD-10-CM

## 2020-07-12 DIAGNOSIS — R1010 Upper abdominal pain, unspecified: Secondary | ICD-10-CM | POA: Diagnosis not present

## 2020-07-12 LAB — BUN: BUN: 12 mg/dL (ref 6–23)

## 2020-07-12 LAB — CREATININE, SERUM: Creatinine, Ser: 1.19 mg/dL (ref 0.40–1.20)

## 2020-07-12 NOTE — Patient Instructions (Addendum)
Your provider has requested that you go to the basement level to pick up stool containers and have labwork before leaving today. Press "B" on the elevator. The lab is located at the first door on the left as you exit the elevator. _____________________________________________ Hold your omeprazole x 7 days. After holding omeprazole, please collect and return your stool studies. ____________________________________________ After you have completed stool studies, you may restart omeprazole. Take 1 tablet once daily. _____________________________________________ Discontinue your probiotic. _____________________________________________ You have been scheduled for a CT scan of the abdomen and pelvis at [facility] ([address]---this is in the same building as [facility]).   You are scheduled on Tuesday 07/16/20 at 4:00 pm. You should arrive 15 minutes prior to your appointment time for registration. Please follow the written instructions below on the day of your exam:  WARNING: IF YOU ARE ALLERGIC TO IODINE/X-RAY DYE, PLEASE NOTIFY RADIOLOGY IMMEDIATELY AT [phone]! YOU WILL BE GIVEN A 13 HOUR PREMEDICATION PREP.  1) Do not eat or drink anything after 12:00 pm (4 hours prior to your test) 2) You have been given 2 bottles of oral contrast to drink. The solution may taste better if refrigerated, but do NOT add ice or any other liquid to this solution. Shake well before drinking.    Drink 1 bottle of contrast @ 2:00 pm (2 hours prior to your exam)  Drink 1 bottle of contrast @ 3:00 pm (1 hour prior to your exam)  You may take any medications as prescribed with a small amount of water, if necessary. If you take any of the following medications: METFORMIN, GLUCOPHAGE, GLUCOVANCE, AVANDAMET, RIOMET, FORTAMET, ACTOPLUS MET, JANUMET, GLUMETZA or METAGLIP, you MAY be asked to HOLD this medication 48 hours AFTER the exam.  The purpose of you drinking the oral contrast is to aid  in the visualization of your intestinal tract. The contrast solution may cause some diarrhea. Depending on your individual set of symptoms, you may also receive an intravenous injection of x-ray contrast/dye. Plan on being at [facility] for 30 minutes or longer, depending on the type of exam you are having performed.  This test typically takes 30-45 minutes to complete.  If you have any questions regarding your exam or if you need to reschedule, you may call the CT department at [phone] between the hours of 8:00 am and 5:00 pm, Monday-Friday.  _______________________________________________________________  

## 2020-07-12 NOTE — Progress Notes (Signed)
   Subjective:    Patient ID: Heidi Herrera, female    DOB: 1966/10/18, 54 y.o.   MRN: [id number]  HPI Akaysha Cobern is a 54 year old female with a history of H. pylori gastritis, nausea, sessile serrated colon polyps, family history of colon cancer in her sister at age 54 who is seen for follow-up.  She is here alone today and was last seen on 04/12/2020 by Ellouise Newer, PA-C.  After her last visit she had an abdominal ultrasound which showed a benign-appearing cyst in the right lobe of the liver.  The liver suggested possible mild underlying hepatic steatosis.  Multiple right renal cysts consistent with adult polycystic kidney disease.  Gallbladder was unremarkable.  EGD and colonoscopy performed on 10/18/2019, see reports for details.  These were reviewed today  She reports that she continues to have issues with nausea and abdominal bloating.  She has early satiety and has trouble eating a full meal.  She feels full with very little food intake.  She reports at times her upper abdomen feels like a tornado with pain and churning discomfort.  She is taking omeprazole 40 mg twice daily and has not had heartburn.  No dysphagia or odynophagia.  She never vomits.  Bowel movements seem to have improved and they are no longer as slow as before and she is feeling less constipated.  She is having 1 sometimes 2 bowel movements daily.  They do feel complete.  No blood in stool or melena.  She has been taking a probiotic which she wonders if this helps.   Review of Systems As per HPI, otherwise negative  Current Medications, Allergies, Past Medical History, Past Surgical History, Family History and Social History were reviewed in [employer] record.     Objective:   Physical Exam BP 114/76   Pulse 71   Ht 5\' 4"  (1.626 m)   Wt 194 lb (88 kg)   SpO2 99%   BMI 33.30 kg/m  Gen: awake, alert, NAD HEENT: anicteric CV: RRR, no mrg Pulm: CTA b/l Abd: soft, upper abdominal  tenderness without rebound or guarding, ND, +BS throughout Ext: no c/c/e Neuro: nonfocal     Assessment & Plan:  54 year old female with a history of H. pylori gastritis, nausea, sessile serrated colon polyps, family history of colon cancer in her sister at age 40 who is seen for follow-up.   1.  Nausea/early satiety/upper abdominal pain and bloating --we did an H. pylori stool antigen shortly after antibiotic therapy for H. pylori which confirmed eradication.  She still having ongoing symptoms and I recommended the following: --Reduce omeprazole to 40 mg once daily as she is not having GERD symptoms and increasing to twice daily has not helped these current symptoms --Hold omeprazole altogether for 7 days to repeat H. pylori stool antigen, resume it once daily after submitting stool test --CT scan abdomen pelvis with contrast --Stop oral probiotic for now as it is unclear if this is helping --If all the above unrevealing consider 4-hour gastric emptying study  2.  History of colon polyps and family history of colon cancer --surveillance colonoscopy recommended in 5 years which would be November 2025

## 2020-07-16 ENCOUNTER — Other Ambulatory Visit: Payer: BC Managed Care – PPO

## 2020-07-17 ENCOUNTER — Telehealth: Payer: Self-pay

## 2020-07-17 NOTE — Telephone Encounter (Signed)
Pt concerned about ct and her kidney function. Discussed with her that her labs were normal and she should be good for the contrast, if labs were elevated radiology would not do the scan. Pt sent message below please advise. She was notified that you are out of the office until 8/9.  [name] to Pyrtle, Lajuan Lines, MD     10:58 AM My kidney functions are normal now because I struggle daily to keep them that way. Do you mind checking with Dr. Raquel James to ask if this is something I should be concerned about.  What are the benefits of the CT scan with contrast vs without? I want to do my best to preserve my kidney function. Thanks.

## 2020-07-22 NOTE — Telephone Encounter (Signed)
Given patient's hesitancy regarding renal function, I would advise we perform CT with oral contrast only.  This will not jeopardize renal function I do think that kidney risk is low with IV contrast, but not 0 and thus proceed with oral contrast only

## 2020-07-23 ENCOUNTER — Other Ambulatory Visit: Payer: Self-pay

## 2020-07-23 ENCOUNTER — Other Ambulatory Visit: Payer: BC Managed Care – PPO

## 2020-07-23 NOTE — Telephone Encounter (Signed)
Pt aware, [place] notified of change in order. Pt knows to go by and pickup contrast.

## 2020-07-29 ENCOUNTER — Ambulatory Visit: Payer: BC Managed Care – PPO | Admitting: Emergency Medicine

## 2020-07-30 ENCOUNTER — Other Ambulatory Visit: Payer: Self-pay

## 2020-07-30 ENCOUNTER — Ambulatory Visit (INDEPENDENT_AMBULATORY_CARE_PROVIDER_SITE_OTHER)
Admission: RE | Admit: 2020-07-30 | Discharge: 2020-07-30 | Disposition: A | Discharge status: Home / Self Care | Payer: BC Managed Care – PPO | Source: Ambulatory Visit | Attending: Internal Medicine | Admitting: Internal Medicine

## 2020-07-30 DIAGNOSIS — R1010 Upper abdominal pain, unspecified: Secondary | ICD-10-CM

## 2020-07-30 DIAGNOSIS — R6881 Early satiety: Secondary | ICD-10-CM

## 2020-07-30 DIAGNOSIS — R140 Abdominal distension (gaseous): Secondary | ICD-10-CM

## 2020-08-05 ENCOUNTER — Telehealth: Payer: Self-pay | Admitting: Internal Medicine

## 2020-08-05 NOTE — Telephone Encounter (Signed)
CT results reviewed with pt over the phone.

## 2020-08-06 ENCOUNTER — Ambulatory Visit: Payer: BC Managed Care – PPO | Admitting: Emergency Medicine

## 2020-08-14 ENCOUNTER — Ambulatory Visit: Payer: BC Managed Care – PPO | Admitting: Emergency Medicine

## 2020-08-14 ENCOUNTER — Other Ambulatory Visit: Payer: Self-pay

## 2020-08-14 ENCOUNTER — Encounter: Payer: Self-pay | Admitting: Emergency Medicine

## 2020-08-14 VITALS — BP 130/80 | HR 62 | Temp 97.9°F | Ht 65.0 in | Wt 189.6 lb

## 2020-08-14 DIAGNOSIS — F4323 Adjustment disorder with mixed anxiety and depressed mood: Secondary | ICD-10-CM

## 2020-08-14 DIAGNOSIS — I10 Essential (primary) hypertension: Secondary | ICD-10-CM

## 2020-08-14 NOTE — Patient Instructions (Addendum)
If you have lab work done today you will be contacted with your lab results within the next 2 weeks.  If you have not heard from [place] then please contact us. The fastest way to get your results is to register for My Chart.   IF you received an x-ray today, you will receive an invoice from [facility]. Please contact [facility] at [phone] with questions or concerns regarding your invoice.   IF you received labwork today, you will receive an invoice from [facility]. Please contact [facility] at [phone] with questions or concerns regarding your invoice.   Our billing staff will not be able to assist you with questions regarding bills from these companies.  You will be contacted with the lab results as soon as they are available. The fastest way to get your results is to activate your My Chart account. Instructions are located on the last page of this paperwork. If you have not heard from [place] regarding the results in 2 weeks, please contact this office.    \ Hypertension, Adult High blood pressure (hypertension) is when the force of blood pumping through the arteries is too strong. The arteries are the blood vessels that carry blood from the heart throughout the body. Hypertension forces the heart to work harder to pump blood and may cause arteries to become narrow or stiff. Untreated or uncontrolled hypertension can cause a heart attack, heart failure, a stroke, kidney disease, and other problems. A blood pressure reading consists of a higher number over a lower number. Ideally, your blood pressure should be below 120/80. The first ("top") number is called the systolic pressure. It is a measure of the pressure in your arteries as your heart beats. The second ("bottom") number is called the diastolic pressure. It is a measure of the pressure in your arteries as the heart relaxes. What are the causes? The exact cause of this condition is not known. There are some conditions  that result in or are related to high blood pressure. What increases the risk? Some risk factors for high blood pressure are under your control. The following factors may make you more likely to develop this condition:  Smoking.  Having type 2 diabetes mellitus, high cholesterol, or both.  Not getting enough exercise or physical activity.  Being overweight.  Having too much fat, sugar, calories, or salt (sodium) in your diet.  Drinking too much alcohol. Some risk factors for high blood pressure may be difficult or impossible to change. Some of these factors include:  Having chronic kidney disease.  Having a family history of high blood pressure.  Age. Risk increases with age.  Race. You may be at higher risk if you are African American.  Gender. Men are at higher risk than women before age 39. After age 30, women are at higher risk than men.  Having obstructive sleep apnea.  Stress. What are the signs or symptoms? High blood pressure may not cause symptoms. Very high blood pressure (hypertensive crisis) may cause:  Headache.  Anxiety.  Shortness of breath.  Nosebleed.  Nausea and vomiting.  Vision changes.  Severe chest pain.  Seizures. How is this diagnosed? This condition is diagnosed by measuring your blood pressure while you are seated, with your arm resting on a flat surface, your legs uncrossed, and your feet flat on the floor. The cuff of the blood pressure monitor will be placed directly against the skin of your upper arm at the level of your heart.  It should be measured at least twice using the same arm. Certain conditions can cause a difference in blood pressure between your right and left arms. Certain factors can cause blood pressure readings to be lower or higher than normal for a short period of time:  When your blood pressure is higher when you are in a health care provider's office than when you are at home, this is called white coat hypertension.  Most people with this condition do not need medicines.  When your blood pressure is higher at home than when you are in a health care provider's office, this is called masked hypertension. Most people with this condition may need medicines to control blood pressure. If you have a high blood pressure reading during one visit or you have normal blood pressure with other risk factors, you may be asked to:  Return on a different day to have your blood pressure checked again.  Monitor your blood pressure at home for 1 week or longer. If you are diagnosed with hypertension, you may have other blood or imaging tests to help your health care provider understand your overall risk for other conditions. How is this treated? This condition is treated by making healthy lifestyle changes, such as eating healthy foods, exercising more, and reducing your alcohol intake. Your health care provider may prescribe medicine if lifestyle changes are not enough to get your blood pressure under control, and if:  Your systolic blood pressure is above 130.  Your diastolic blood pressure is above 80. Your personal target blood pressure may vary depending on your medical conditions, your age, and other factors. Follow these instructions at home: Eating and drinking   Eat a diet that is high in fiber and potassium, and low in sodium, added sugar, and fat. An example eating plan is called the DASH (Dietary Approaches to Stop Hypertension) diet. To eat this way: ? Eat plenty of fresh fruits and vegetables. Try to fill one half of your plate at each meal with fruits and vegetables. ? Eat whole grains, such as whole-wheat pasta, brown rice, or whole-grain bread. Fill about one fourth of your plate with whole grains. ? Eat or drink low-fat dairy products, such as skim milk or low-fat yogurt. ? Avoid fatty cuts of meat, processed or cured meats, and poultry with skin. Fill about one fourth of your plate with lean proteins, such  as fish, chicken without skin, beans, eggs, or tofu. ? Avoid pre-made and processed foods. These tend to be higher in sodium, added sugar, and fat.  Reduce your daily sodium intake. Most people with hypertension should eat less than 1,500 mg of sodium a day.  Do not drink alcohol if: ? Your health care provider tells you not to drink. ? You are pregnant, may be pregnant, or are planning to become pregnant.  If you drink alcohol: ? Limit how much you use to:  0-1 drink a day for women.  0-2 drinks a day for men. ? Be aware of how much alcohol is in your drink. In the [place] one drink equals one 12 oz bottle of beer (355 mL), one 5 oz glass of wine (148 mL), or one 1 oz glass of hard liquor (44 mL). Lifestyle   Work with your health care provider to maintain a healthy body weight or to lose weight. Ask what an ideal weight is for you.  Get at least 30 minutes of exercise most days of the week. Activities may include walking, swimming, or   biking.  Include exercise to strengthen your muscles (resistance exercise), such as Pilates or lifting weights, as part of your weekly exercise routine. Try to do these types of exercises for 30 minutes at least 3 days a week.  Do not use any products that contain nicotine or tobacco, such as cigarettes, e-cigarettes, and chewing tobacco. If you need help quitting, ask your health care provider.  Monitor your blood pressure at home as told by your health care provider.  Keep all follow-up visits as told by your health care provider. This is important. Medicines  Take over-the-counter and prescription medicines only as told by your health care provider. Follow directions carefully. Blood pressure medicines must be taken as prescribed.  Do not skip doses of blood pressure medicine. Doing this puts you at risk for problems and can make the medicine less effective.  Ask your health care provider about side effects or reactions to medicines that you  should watch for. Contact a health care provider if you:  Think you are having a reaction to a medicine you are taking.  Have headaches that keep coming back (recurring).  Feel dizzy.  Have swelling in your ankles.  Have trouble with your vision. Get help right away if you:  Develop a severe headache or confusion.  Have unusual weakness or numbness.  Feel faint.  Have severe pain in your chest or abdomen.  Vomit repeatedly.  Have trouble breathing. Summary  Hypertension is when the force of blood pumping through your arteries is too strong. If this condition is not controlled, it may put you at risk for serious complications.  Your personal target blood pressure may vary depending on your medical conditions, your age, and other factors. For most people, a normal blood pressure is less than 120/80.  Hypertension is treated with lifestyle changes, medicines, or a combination of both. Lifestyle changes include losing weight, eating a healthy, low-sodium diet, exercising more, and limiting alcohol. This information is not intended to replace advice given to you by your health care provider. Make sure you discuss any questions you have with your health care provider. Document Revised: 08/10/2018 Document Reviewed: 08/10/2018 Elsevier Patient Education  2020 [place] With Depression Everyone experiences occasional disappointment, sadness, and loss in their lives. When you are feeling down, blue, or sad for at least 2 weeks in a row, it may mean that you have depression. Depression can affect your thoughts and feelings, relationships, daily activities, and physical health. It is caused by changes in the way your brain functions. If you receive a diagnosis of depression, your health care provider will tell you which type of depression you have and what treatment options are available to you. If you are living with depression, there are ways to help you recover from it and  also ways to prevent it from coming back. How to cope with lifestyle changes Coping with stress     Stress is your body's reaction to life changes and events, both good and bad. Stressful situations may include:  Getting married.  The death of a spouse.  Losing a job.  Retiring.  Having a baby. Stress can last just a few hours or it can be ongoing. Stress can play a major role in depression, so it is important to learn both how to cope with stress and how to think about it differently. Talk with your health care provider or a counselor if you would like to learn more about stress reduction. He or  she may suggest some stress reduction techniques, such as:  Music therapy. This can include creating music or listening to music. Choose music that you enjoy and that inspires you.  Mindfulness-based meditation. This kind of meditation can be done while sitting or walking. It involves being aware of your normal breaths, rather than trying to control your breathing.  Centering prayer. This is a kind of meditation that involves focusing on a spiritual word or phrase. Choose a word, phrase, or sacred image that is meaningful to you and that brings you peace.  Deep breathing. To do this, expand your stomach and inhale slowly through your nose. Hold your breath for 3-5 seconds, then exhale slowly, allowing your stomach muscles to relax.  Muscle relaxation. This involves intentionally tensing muscles then relaxing them. Choose a stress reduction technique that fits your lifestyle and personality. Stress reduction techniques take time and practice to develop. Set aside 5-15 minutes a day to do them. Therapists can offer training in these techniques. The training may be covered by some insurance plans. Other things you can do to manage stress include:  Keeping a stress diary. This can help you learn what triggers your stress and ways to control your response.  Understanding what your limits are and  saying no to requests or events that lead to a schedule that is too full.  Thinking about how you respond to certain situations. You may not be able to control everything, but you can control how you react.  Adding humor to your life by watching funny films or TV shows.  Making time for activities that help you relax and not feeling guilty about spending your time this way.  Medicines Your health care provider may suggest certain medicines if he or she feels that they will help improve your condition. Avoid using alcohol and other substances that may prevent your medicines from working properly (may interact). It is also important to:  Talk with your pharmacist or health care provider about all the medicines that you take, their possible side effects, and what medicines are safe to take together.  Make it your goal to take part in all treatment decisions (shared decision-making). This includes giving input on the side effects of medicines. It is best if shared decision-making with your health care provider is part of your total treatment plan. If your health care provider prescribes a medicine, you may not notice the full benefits of it for 4-8 weeks. Most people who are treated for depression need to be on medicine for at least 6-12 months after they feel better. If you are taking medicines as part of your treatment, do not stop taking medicines without first talking to your health care provider. You may need to have the medicine slowly decreased (tapered) over time to decrease the risk of harmful side effects. Relationships Your health care provider may suggest family therapy along with individual therapy and drug therapy. While there may not be family problems that are causing you to feel depressed, it is still important to make sure your family learns as much as they can about your mental health. Having your family's support can help make your treatment successful. How to recognize changes in  your condition Everyone has a different response to treatment for depression. Recovery from major depression happens when you have not had signs of major depression for two months. This may mean that you will start to:  Have more interest in doing activities.  Feel less hopeless than you did  2 months ago.  Have more energy.  Overeat less often, or have better or improving appetite.  Have better concentration. Your health care provider will work with you to decide the next steps in your recovery. It is also important to recognize when your condition is getting worse. Watch for these signs:  Having fatigue or low energy.  Eating too much or too little.  Sleeping too much or too little.  Feeling restless, agitated, or hopeless.  Having trouble concentrating or making decisions.  Having unexplained physical complaints.  Feeling irritable, angry, or aggressive. Get help as soon as you or your family members notice these symptoms coming back. How to get support and help from others How to talk with friends and family members about your condition  Talking to friends and family members about your condition can provide you with one way to get support and guidance. Reach out to trusted friends or family members, explain your symptoms to them, and let them know that you are working with a health care provider to treat your depression. Financial resources Not all insurance plans cover mental health care, so it is important to check with your insurance carrier. If paying for co-pays or counseling services is a problem, search for a local or county mental health care center. They may be able to offer public mental health care services at low or no cost when you are not able to see a private health care provider. If you are taking medicine for depression, you may be able to get the generic form, which may be less expensive. Some makers of prescription medicines also offer help to patients who  cannot afford the medicines they need. Follow these instructions at home:   Get the right amount and quality of sleep.  Cut down on using caffeine, tobacco, alcohol, and other potentially harmful substances.  Try to exercise, such as walking or lifting small weights.  Take over-the-counter and prescription medicines only as told by your health care provider.  Eat a healthy diet that includes plenty of vegetables, fruits, whole grains, low-fat dairy products, and lean protein. Do not eat a lot of foods that are high in solid fats, added sugars, or salt.  Keep all follow-up visits as told by your health care provider. This is important. Contact a health care provider if:  You stop taking your antidepressant medicines, and you have any of these symptoms: ? Nausea. ? Headache. ? Feeling lightheaded. ? Chills and body aches. ? Not being able to sleep (insomnia).  You or your friends and family think your depression is getting worse. Get help right away if:  You have thoughts of hurting yourself or others. If you ever feel like you may hurt yourself or others, or have thoughts about taking your own life, get help right away. You can go to your nearest emergency department or call:  Your local emergency services (911 in the [place]  A suicide crisis helpline, such as the [place] at 252-868-9626. This is open 24-hours a day. Summary  If you are living with depression, there are ways to help you recover from it and also ways to prevent it from coming back.  Work with your health care team to create a management plan that includes counseling, stress management techniques, and healthy lifestyle habits. This information is not intended to replace advice given to you by your health care provider. Make sure you discuss any questions you have with your health care provider. Document Revised:  03/24/2019 Document Reviewed: 11/02/2016 Elsevier Patient Education   [place].

## 2020-08-14 NOTE — Assessment & Plan Note (Signed)
Presently active.  Patient states she will follow-up with her therapist.  Declined medication today.  Follow-up in 3 months.

## 2020-08-14 NOTE — Assessment & Plan Note (Signed)
Well-controlled hypertension.  Patient wants to stop evening dose of lisinopril due to restless leg syndrome.  Continue daily lisinopril-hydrochlorothiazide dose.  Continue monitoring blood pressure readings at home.  Follow-up in 3 months.

## 2020-08-14 NOTE — Progress Notes (Signed)
Heidi Herrera 54 y.o.   Chief Complaint  Patient presents with  . Hypertension    f/u   . Anxiety    started to become anxious since covid started    HISTORY OF PRESENT ILLNESS: This is a 54 y.o. female with history of hypertension here for follow-up. Presently taking lisinopril-HCTZ 20-25 daily and an additional dose of 20 mg of lisinopril in the evening. Feels like the second dose of lisinopril is exacerbating her restless legs. Also complaining of excessive anxiety leading to depression. Stressors at work. Advised to follow-up with her therapist. Does not want to start any medication at present time. Depression screen [facility] 2/9 08/14/2020 08/14/2020 05/07/2020 04/17/2020 03/19/2020  Decreased Interest 3 0 0 0 0  Down, Depressed, Hopeless 3 0 0 0 0  PHQ - 2 Score 6 0 0 0 0  Altered sleeping 3 - - - -  Tired, decreased energy 3 - - - -  Change in appetite 2 - - - -  Feeling bad or failure about yourself  0 - - - -  Trouble concentrating 1 - - - -  Moving slowly or fidgety/restless 0 - - - -  Suicidal thoughts 0 - - - -  PHQ-9 Score 15 - - - -  Difficult doing work/chores Somewhat difficult - - - -   GAD 7 : Generalized Anxiety Score 08/14/2020 04/17/2020 03/19/2020  Nervous, Anxious, on Edge 3 0 0  Control/stop worrying 3 0 0  Worry too much - different things 3 0 0  Trouble relaxing 3 0 0  Restless 3 0 0  Easily annoyed or irritable 2 0 0  Afraid - awful might happen 3 0 0  Total GAD 7 Score 20 0 0  Anxiety Difficulty Extremely difficult Not difficult at all Not difficult at all      BP Readings from Last 3 Encounters:  08/14/20 130/80  07/12/20 114/76  05/07/20 (!) 144/84  Recently seen by GI doctor. CT scan of abdomen showed multiple bilateral renal cysts. "I do not know what to eat". Multiple GI symptoms with different types of food. Renal function improved.   No other complaints or medical concerns today.   HPI   Prior to Admission medications   Medication Sig Start  Date End Date Taking? Authorizing Provider  baclofen (LIORESAL) 10 MG tablet Take 0.5-1 tablets (5-10 mg total) by mouth 3 (three) times daily as needed for muscle spasms. 04/24/20  Yes Hilts, Legrand Como, MD  Biotin 1000 MCG tablet Take 1,000 mcg by mouth daily.    Yes [provider]  calcium-vitamin D (OSCAL WITH D) 500-200 MG-UNIT per tablet Take 1 tablet by mouth 2 (two) times daily. Calcium 1000 mg with Magnesium 400 mg and zinc 15 mg, taking 1 capsule 2 times daily   Yes [provider]  cetirizine (ZYRTEC) 10 MG tablet Take 1 tablet by mouth once daily 03/12/20  Yes Lillia Lengel, [place], MD  Cyanocobalamin (VITAMIN B 12 PO) Take 1 tablet by mouth daily.   Yes [provider]  IFEREX 150 150 MG capsule Take 1 capsule by mouth twice daily 07/05/19  Yes Jakeel Starliper, Ines Bloomer, MD  lisinopril (ZESTRIL) 20 MG tablet Take 1 tablet (20 mg total) by mouth daily. 05/21/20  Yes Alpa Salvo, Ines Bloomer, MD  lisinopril-hydrochlorothiazide (ZESTORETIC) 20-25 MG tablet Take 1 tablet by mouth daily. 05/21/20  Yes Darnesha Diloreto, Ines Bloomer, MD  omeprazole (PRILOSEC) 40 MG capsule Take 1 capsule (40 mg total) by mouth 2 (  two) times daily. Patient taking differently: Take 40 mg by mouth daily.  04/12/20  Yes Levin Erp, PA    Allergies  Allergen Reactions  . Bystolic [Nebivolol Hcl] Anaphylaxis, Itching and Swelling  . Biaxin [Clarithromycin]     The category of the Biaxin family.  Also allergic to water chestnuts.  . Erythromycin Base Rash    Patient Active Problem List   Diagnosis Date Noted  . Premenstrual tension syndrome 04/24/2020  . Polycystic kidney disease 11/29/2018  . Benign paroxysmal positional vertigo due to bilateral vestibular disorder 09/26/2018  . Body mass index (BMI) of 32.0-32.9 in adult 01/25/2018  . Amenorrhea 06/28/2017  . Mastodynia of left breast 06/28/2017  . CKD (chronic kidney disease) stage 3, GFR 30-59 ml/min (HCC) 04/15/2017  . Fibroid  12/01/2016  . Anosmia 05/01/2016  . Ear itch 05/01/2016  . Fibroid, uterine   . Hamartoma ([place]) 11/30/2014  . Essential hypertension 09/29/2014  . Chronic anemia 02/02/2014  . Hypertrophic scar 05/23/2012  . Seborrheic dermatitis 05/23/2012  . 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 VITAMIN D DEFICIENCY 12/18/2010  . HYPERLIPIDEMIA 12/18/2010  . NEPHROLITHIASIS 03/24/2010  . 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 Gastroesophageal reflux disease without esophagitis 08/27/2009  . FIBROADENOSIS, BREAST 02/10/2007    Past Medical History:  Diagnosis Date  . Acid reflux   . Allergy   . Anemia   . Anxiety   . Fibroids   . GERD (gastroesophageal reflux disease)   . H. pylori infection   . Hypertension   . Kidney failure    stage 2  . Migraine   . Obesity   . Polycystic kidney disease   . Sebaceous cyst   . Sessile colonic polyp   . Vertigo     Past Surgical History:  Procedure Laterality Date  . IR GENERIC HISTORICAL  12/01/2016   IR ANGIOGRAM PELVIS SELECTIVE OR SUPRASELECTIVE 12/01/2016 Sandi Mariscal, MD [facility]  . IR GENERIC HISTORICAL  12/01/2016   IR US GUIDE VASC ACCESS RIGHT 12/01/2016 Sandi Mariscal, MD [facility]  . IR GENERIC HISTORICAL  12/01/2016   IR EMBO ARTERIAL NOT HEMORR HEMANG INC GUIDE ROADMAPPING 12/01/2016 Sandi Mariscal, MD [facility]  . IR GENERIC HISTORICAL  12/01/2016   IR ANGIOGRAM SELECTIVE EACH ADDITIONAL VESSEL 12/01/2016 Sandi Mariscal, MD [facility]  . IR GENERIC HISTORICAL  12/01/2016   IR ANGIOGRAM SELECTIVE EACH ADDITIONAL VESSEL 12/01/2016 Sandi Mariscal, MD [facility]  . IR GENERIC HISTORICAL  12/01/2016   IR ANGIOGRAM PELVIS SELECTIVE OR SUPRASELECTIVE 12/01/2016 Sandi Mariscal, MD [facility]  . IR GENERIC HISTORICAL  01/07/2017   IR RADIOLOGIST EVAL & MGMT 01/07/2017 Sandi Mariscal, MD [facility]  . IR RADIOLOGIST EVAL & MGMT  08/31/2017  . LOBECTOMY  2009  . MYOMECTOMY  2007  . UTERINE FIBROID EMBOLIZATION     12/01/2016    Social History    Socioeconomic History  . Marital status: Divorced    Spouse name: Not on file  . Number of children: 1  . Years of education: Not on file  . Highest education level: Master's degree (e.g., MA, MS, MEng, MEd, MSW, MBA)  Occupational History    Comment: [employer]  Tobacco Use  . Smoking status: Never Smoker  . Smokeless tobacco: Never Used  Vaping Use  . Vaping Use: Never used  Substance and Sexual Activity  . Alcohol use: Not Currently    Alcohol/week: 1.0 standard drink    Types: 1 Glasses of wine per week  Comment: quit years ago  . Drug use: No  . Sexual activity: Not on file  Other Topics Concern  . Not on file  Social History Narrative   Lives alone   No caffiene   Social Determinants of Health   Financial Resource Strain:   . Difficulty of Paying Living Expenses: Not on file  Food Insecurity:   . Worried About Charity fundraiser in the Last Year: Not on file  . Ran Out of Food in the Last Year: Not on file  Transportation Needs:   . Lack of Transportation (Medical): Not on file  . Lack of Transportation (Non-Medical): Not on file  Physical Activity:   . Days of Exercise per Week: Not on file  . Minutes of Exercise per Session: Not on file  Stress:   . Feeling of Stress : Not on file  Social Connections:   . Frequency of Communication with Friends and Family: Not on file  . Frequency of Social Gatherings with Friends and Family: Not on file  . Attends Religious Services: Not on file  . Active Member of Clubs or Organizations: Not on file  . Attends Archivist Meetings: Not on file  . Marital Status: Not on file  Intimate Partner Violence:   . Fear of Current or Ex-Partner: Not on file  . Emotionally Abused: Not on file  . Physically Abused: Not on file  . Sexually Abused: Not on file    Family History  Problem Relation Age of Onset  . Hypertension Mother   . Obesity Mother   . Stroke Mother   . Diabetes Mother   . Hypertension Sister   .  Hyperlipidemia Sister   . Obesity Sister   . Diabetes Sister   . Heart disease Sister   . Mental illness Sister   . Glaucoma Sister   . Hypertension Brother   . Obesity Brother   . Stroke Brother   . Glaucoma Brother   . Mental illness Father   . Hypertension Father   . Cancer Brother   . Stroke Brother   . Hypertension Sister   . Mental illness Sister   . Glaucoma Maternal Uncle   . Cancer Sister 10  . Diabetes Sister   . Esophageal cancer Maternal Grandfather   . Pancreatic cancer Cousin   . Stomach cancer Neg Hx   . Colon cancer Neg Hx      Review of Systems  Constitutional: Negative.  Negative for chills and fever.  HENT: Negative.  Negative for congestion and sore throat.   Respiratory: Negative.  Negative for cough and shortness of breath.   Cardiovascular: Negative.  Negative for chest pain and palpitations.  Gastrointestinal: Negative for abdominal pain, blood in stool, diarrhea, melena, nausea and vomiting.  Genitourinary: Negative.  Negative for dysuria and hematuria.  Skin: Negative.  Negative for rash.  Neurological: Negative for dizziness and headaches.  All other systems reviewed and are negative.   Vitals:   08/14/20 1135 08/14/20 1143  BP: (!) 156/81 130/80  Pulse: 62   Temp: 97.9 F (36.6 C)   SpO2: 97%    Wt Readings from Last 3 Encounters:  08/14/20 189 lb 9.6 oz (86 kg)  07/12/20 194 lb (88 kg)  05/07/20 186 lb (84.4 kg)    Physical Exam Vitals reviewed.  Constitutional:      Appearance: Normal appearance.  HENT:     Head: Normocephalic.  Eyes:     Extraocular Movements: Extraocular movements  intact.     Pupils: Pupils are equal, round, and reactive to light.  Cardiovascular:     Rate and Rhythm: Normal rate.  Pulmonary:     Effort: Pulmonary effort is normal.  Musculoskeletal:        General: Normal range of motion.     Cervical back: Normal range of motion.  Skin:    General: Skin is warm and dry.     Capillary Refill:  Capillary refill takes less than 2 seconds.  Neurological:     General: No focal deficit present.     Mental Status: She is alert and oriented to person, place, and time.  Psychiatric:        Mood and Affect: Mood normal.        Behavior: Behavior normal.     Comments: Started to cry during interview.      ASSESSMENT & PLAN: Essential hypertension Well-controlled hypertension.  Patient wants to stop evening dose of lisinopril due to restless leg syndrome.  Continue daily lisinopril-hydrochlorothiazide dose.  Continue monitoring blood pressure readings at home.  Follow-up in 3 months.  Adjustment reaction with anxiety and depression Presently active.  Patient states she will follow-up with her therapist.  Declined medication today.  Follow-up in 3 months.  Heidi Herrera was seen today for hypertension and anxiety.  Diagnoses and all orders for this visit:  Essential hypertension  Adjustment reaction with anxiety and depression    Patient Instructions       If you have lab work done today you will be contacted with your lab results within the next 2 weeks.  If you have not heard from [place] then please contact us. The fastest way to get your results is to register for My Chart.   IF you received an x-ray today, you will receive an invoice from [facility]. Please contact [facility] at [phone] with questions or concerns regarding your invoice.   IF you received labwork today, you will receive an invoice from [facility]. Please contact [facility] at [phone] with questions or concerns regarding your invoice.   Our billing staff will not be able to assist you with questions regarding bills from these companies.  You will be contacted with the lab results as soon as they are available. The fastest way to get your results is to activate your My Chart account. Instructions are located on the last page of this paperwork. If you have not heard from [place] regarding the  results in 2 weeks, please contact this office.    \ Hypertension, Adult High blood pressure (hypertension) is when the force of blood pumping through the arteries is too strong. The arteries are the blood vessels that carry blood from the heart throughout the body. Hypertension forces the heart to work harder to pump blood and may cause arteries to become narrow or stiff. Untreated or uncontrolled hypertension can cause a heart attack, heart failure, a stroke, kidney disease, and other problems. A blood pressure reading consists of a higher number over a lower number. Ideally, your blood pressure should be below 120/80. The first ("top") number is called the systolic pressure. It is a measure of the pressure in your arteries as your heart beats. The second ("bottom") number is called the diastolic pressure. It is a measure of the pressure in your arteries as the heart relaxes. What are the causes? The exact cause of this condition is not known. There are some conditions that result in or are related to high blood pressure.  What increases the risk? Some risk factors for high blood pressure are under your control. The following factors may make you more likely to develop this condition:  Smoking.  Having type 2 diabetes mellitus, high cholesterol, or both.  Not getting enough exercise or physical activity.  Being overweight.  Having too much fat, sugar, calories, or salt (sodium) in your diet.  Drinking too much alcohol. Some risk factors for high blood pressure may be difficult or impossible to change. Some of these factors include:  Having chronic kidney disease.  Having a family history of high blood pressure.  Age. Risk increases with age.  Race. You may be at higher risk if you are African American.  Gender. Men are at higher risk than women before age 61. After age 41, women are at higher risk than men.  Having obstructive sleep apnea.  Stress. What are the signs or  symptoms? High blood pressure may not cause symptoms. Very high blood pressure (hypertensive crisis) may cause:  Headache.  Anxiety.  Shortness of breath.  Nosebleed.  Nausea and vomiting.  Vision changes.  Severe chest pain.  Seizures. How is this diagnosed? This condition is diagnosed by measuring your blood pressure while you are seated, with your arm resting on a flat surface, your legs uncrossed, and your feet flat on the floor. The cuff of the blood pressure monitor will be placed directly against the skin of your upper arm at the level of your heart. It should be measured at least twice using the same arm. Certain conditions can cause a difference in blood pressure between your right and left arms. Certain factors can cause blood pressure readings to be lower or higher than normal for a short period of time:  When your blood pressure is higher when you are in a health care provider's office than when you are at home, this is called white coat hypertension. Most people with this condition do not need medicines.  When your blood pressure is higher at home than when you are in a health care provider's office, this is called masked hypertension. Most people with this condition may need medicines to control blood pressure. If you have a high blood pressure reading during one visit or you have normal blood pressure with other risk factors, you may be asked to:  Return on a different day to have your blood pressure checked again.  Monitor your blood pressure at home for 1 week or longer. If you are diagnosed with hypertension, you may have other blood or imaging tests to help your health care provider understand your overall risk for other conditions. How is this treated? This condition is treated by making healthy lifestyle changes, such as eating healthy foods, exercising more, and reducing your alcohol intake. Your health care provider may prescribe medicine if lifestyle changes  are not enough to get your blood pressure under control, and if:  Your systolic blood pressure is above 130.  Your diastolic blood pressure is above 80. Your personal target blood pressure may vary depending on your medical conditions, your age, and other factors. Follow these instructions at home: Eating and drinking   Eat a diet that is high in fiber and potassium, and low in sodium, added sugar, and fat. An example eating plan is called the DASH (Dietary Approaches to Stop Hypertension) diet. To eat this way: ? Eat plenty of fresh fruits and vegetables. Try to fill one half of your plate at each meal with fruits and vegetables. ?  Eat whole grains, such as whole-wheat pasta, brown rice, or whole-grain bread. Fill about one fourth of your plate with whole grains. ? Eat or drink low-fat dairy products, such as skim milk or low-fat yogurt. ? Avoid fatty cuts of meat, processed or cured meats, and poultry with skin. Fill about one fourth of your plate with lean proteins, such as fish, chicken without skin, beans, eggs, or tofu. ? Avoid pre-made and processed foods. These tend to be higher in sodium, added sugar, and fat.  Reduce your daily sodium intake. Most people with hypertension should eat less than 1,500 mg of sodium a day.  Do not drink alcohol if: ? Your health care provider tells you not to drink. ? You are pregnant, may be pregnant, or are planning to become pregnant.  If you drink alcohol: ? Limit how much you use to:  0-1 drink a day for women.  0-2 drinks a day for men. ? Be aware of how much alcohol is in your drink. In the [place] one drink equals one 12 oz bottle of beer (355 mL), one 5 oz glass of wine (148 mL), or one 1 oz glass of hard liquor (44 mL). Lifestyle   Work with your health care provider to maintain a healthy body weight or to lose weight. Ask what an ideal weight is for you.  Get at least 30 minutes of exercise most days of the week. Activities may  include walking, swimming, or biking.  Include exercise to strengthen your muscles (resistance exercise), such as Pilates or lifting weights, as part of your weekly exercise routine. Try to do these types of exercises for 30 minutes at least 3 days a week.  Do not use any products that contain nicotine or tobacco, such as cigarettes, e-cigarettes, and chewing tobacco. If you need help quitting, ask your health care provider.  Monitor your blood pressure at home as told by your health care provider.  Keep all follow-up visits as told by your health care provider. This is important. Medicines  Take over-the-counter and prescription medicines only as told by your health care provider. Follow directions carefully. Blood pressure medicines must be taken as prescribed.  Do not skip doses of blood pressure medicine. Doing this puts you at risk for problems and can make the medicine less effective.  Ask your health care provider about side effects or reactions to medicines that you should watch for. Contact a health care provider if you:  Think you are having a reaction to a medicine you are taking.  Have headaches that keep coming back (recurring).  Feel dizzy.  Have swelling in your ankles.  Have trouble with your vision. Get help right away if you:  Develop a severe headache or confusion.  Have unusual weakness or numbness.  Feel faint.  Have severe pain in your chest or abdomen.  Vomit repeatedly.  Have trouble breathing. Summary  Hypertension is when the force of blood pumping through your arteries is too strong. If this condition is not controlled, it may put you at risk for serious complications.  Your personal target blood pressure may vary depending on your medical conditions, your age, and other factors. For most people, a normal blood pressure is less than 120/80.  Hypertension is treated with lifestyle changes, medicines, or a combination of both. Lifestyle changes  include losing weight, eating a healthy, low-sodium diet, exercising more, and limiting alcohol. This information is not intended to replace advice given to you by your health  care provider. Make sure you discuss any questions you have with your health care provider. Document Revised: 08/10/2018 Document Reviewed: 08/10/2018 Elsevier Patient Education  2020 [place] With Depression Everyone experiences occasional disappointment, sadness, and loss in their lives. When you are feeling down, blue, or sad for at least 2 weeks in a row, it may mean that you have depression. Depression can affect your thoughts and feelings, relationships, daily activities, and physical health. It is caused by changes in the way your brain functions. If you receive a diagnosis of depression, your health care provider will tell you which type of depression you have and what treatment options are available to you. If you are living with depression, there are ways to help you recover from it and also ways to prevent it from coming back. How to cope with lifestyle changes Coping with stress     Stress is your body's reaction to life changes and events, both good and bad. Stressful situations may include:  Getting married.  The death of a spouse.  Losing a job.  Retiring.  Having a baby. Stress can last just a few hours or it can be ongoing. Stress can play a major role in depression, so it is important to learn both how to cope with stress and how to think about it differently. Talk with your health care provider or a counselor if you would like to learn more about stress reduction. He or she may suggest some stress reduction techniques, such as:  Music therapy. This can include creating music or listening to music. Choose music that you enjoy and that inspires you.  Mindfulness-based meditation. This kind of meditation can be done while sitting or walking. It involves being aware of your normal  breaths, rather than trying to control your breathing.  Centering prayer. This is a kind of meditation that involves focusing on a spiritual word or phrase. Choose a word, phrase, or sacred image that is meaningful to you and that brings you peace.  Deep breathing. To do this, expand your stomach and inhale slowly through your nose. Hold your breath for 3-5 seconds, then exhale slowly, allowing your stomach muscles to relax.  Muscle relaxation. This involves intentionally tensing muscles then relaxing them. Choose a stress reduction technique that fits your lifestyle and personality. Stress reduction techniques take time and practice to develop. Set aside 5-15 minutes a day to do them. Therapists can offer training in these techniques. The training may be covered by some insurance plans. Other things you can do to manage stress include:  Keeping a stress diary. This can help you learn what triggers your stress and ways to control your response.  Understanding what your limits are and saying no to requests or events that lead to a schedule that is too full.  Thinking about how you respond to certain situations. You may not be able to control everything, but you can control how you react.  Adding humor to your life by watching funny films or TV shows.  Making time for activities that help you relax and not feeling guilty about spending your time this way.  Medicines Your health care provider may suggest certain medicines if he or she feels that they will help improve your condition. Avoid using alcohol and other substances that may prevent your medicines from working properly (may interact). It is also important to:  Talk with your pharmacist or health care provider about all the medicines that you take, their possible  side effects, and what medicines are safe to take together.  Make it your goal to take part in all treatment decisions (shared decision-making). This includes giving input on the  side effects of medicines. It is best if shared decision-making with your health care provider is part of your total treatment plan. If your health care provider prescribes a medicine, you may not notice the full benefits of it for 4-8 weeks. Most people who are treated for depression need to be on medicine for at least 6-12 months after they feel better. If you are taking medicines as part of your treatment, do not stop taking medicines without first talking to your health care provider. You may need to have the medicine slowly decreased (tapered) over time to decrease the risk of harmful side effects. Relationships Your health care provider may suggest family therapy along with individual therapy and drug therapy. While there may not be family problems that are causing you to feel depressed, it is still important to make sure your family learns as much as they can about your mental health. Having your family's support can help make your treatment successful. How to recognize changes in your condition Everyone has a different response to treatment for depression. Recovery from major depression happens when you have not had signs of major depression for two months. This may mean that you will start to:  Have more interest in doing activities.  Feel less hopeless than you did 2 months ago.  Have more energy.  Overeat less often, or have better or improving appetite.  Have better concentration. Your health care provider will work with you to decide the next steps in your recovery. It is also important to recognize when your condition is getting worse. Watch for these signs:  Having fatigue or low energy.  Eating too much or too little.  Sleeping too much or too little.  Feeling restless, agitated, or hopeless.  Having trouble concentrating or making decisions.  Having unexplained physical complaints.  Feeling irritable, angry, or aggressive. Get help as soon as you or your family members  notice these symptoms coming back. How to get support and help from others How to talk with friends and family members about your condition  Talking to friends and family members about your condition can provide you with one way to get support and guidance. Reach out to trusted friends or family members, explain your symptoms to them, and let them know that you are working with a health care provider to treat your depression. Financial resources Not all insurance plans cover mental health care, so it is important to check with your insurance carrier. If paying for co-pays or counseling services is a problem, search for a local or county mental health care center. They may be able to offer public mental health care services at low or no cost when you are not able to see a private health care provider. If you are taking medicine for depression, you may be able to get the generic form, which may be less expensive. Some makers of prescription medicines also offer help to patients who cannot afford the medicines they need. Follow these instructions at home:   Get the right amount and quality of sleep.  Cut down on using caffeine, tobacco, alcohol, and other potentially harmful substances.  Try to exercise, such as walking or lifting small weights.  Take over-the-counter and prescription medicines only as told by your health care provider.  Eat a healthy diet that includes  plenty of vegetables, fruits, whole grains, low-fat dairy products, and lean protein. Do not eat a lot of foods that are high in solid fats, added sugars, or salt.  Keep all follow-up visits as told by your health care provider. This is important. Contact a health care provider if:  You stop taking your antidepressant medicines, and you have any of these symptoms: ? Nausea. ? Headache. ? Feeling lightheaded. ? Chills and body aches. ? Not being able to sleep (insomnia).  You or your friends and family think your depression  is getting worse. Get help right away if:  You have thoughts of hurting yourself or others. If you ever feel like you may hurt yourself or others, or have thoughts about taking your own life, get help right away. You can go to your nearest emergency department or call:  Your local emergency services (911 in the [place]  A suicide crisis helpline, such as the [place] at (808) 887-5851. This is open 24-hours a day. Summary  If you are living with depression, there are ways to help you recover from it and also ways to prevent it from coming back.  Work with your health care team to create a management plan that includes counseling, stress management techniques, and healthy lifestyle habits. This information is not intended to replace advice given to you by your health care provider. Make sure you discuss any questions you have with your health care provider. Document Revised: 03/24/2019 Document Reviewed: 11/02/2016 Elsevier Patient Education  2020 Elsevier Inc.      Agustina Caroli, MD [facility]

## 2020-08-14 NOTE — Progress Notes (Signed)
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 Please click the "PHQ-9" link to complete the required documentation.          Depression screen [facility] 2/9 08/14/2020 05/07/2020 04/17/2020 03/19/2020 02/08/2020  Decreased Interest 0 0 0 0 0  Down, Depressed, Hopeless 0 0 0 0 0  PHQ - 2 Score 0 0 0 0 0

## 2020-08-15 ENCOUNTER — Ambulatory Visit: Payer: BC Managed Care – PPO | Admitting: Emergency Medicine

## 2020-09-13 ENCOUNTER — Other Ambulatory Visit: Payer: BC Managed Care – PPO

## 2020-09-16 LAB — HELICOBACTER PYLORI  SPECIAL ANTIGEN
MICRO NUMBER:: 11021165
SPECIMEN QUALITY: ADEQUATE

## 2020-11-20 ENCOUNTER — Ambulatory Visit: Payer: BC Managed Care – PPO | Admitting: Emergency Medicine

## 2021-02-12 ENCOUNTER — Other Ambulatory Visit: Payer: Self-pay | Admitting: Family Medicine

## 2021-02-12 DIAGNOSIS — Z1231 Encounter for screening mammogram for malignant neoplasm of breast: Secondary | ICD-10-CM

## 2021-02-20 ENCOUNTER — Other Ambulatory Visit: Payer: Self-pay | Admitting: Nephrology

## 2021-02-20 DIAGNOSIS — N182 Chronic kidney disease, stage 2 (mild): Secondary | ICD-10-CM

## 2021-03-12 ENCOUNTER — Ambulatory Visit
Admission: RE | Admit: 2021-03-12 | Discharge: 2021-03-12 | Disposition: A | Discharge status: Home / Self Care | Payer: BC Managed Care – PPO | Source: Ambulatory Visit | Attending: Nephrology | Admitting: Nephrology

## 2021-03-12 DIAGNOSIS — N182 Chronic kidney disease, stage 2 (mild): Secondary | ICD-10-CM

## 2021-04-07 ENCOUNTER — Other Ambulatory Visit: Payer: Self-pay

## 2021-04-07 ENCOUNTER — Ambulatory Visit
Admission: RE | Admit: 2021-04-07 | Discharge: 2021-04-07 | Disposition: A | Discharge status: Home / Self Care | Payer: BC Managed Care – PPO | Source: Ambulatory Visit | Attending: Family Medicine | Admitting: Family Medicine

## 2021-04-07 DIAGNOSIS — Z1231 Encounter for screening mammogram for malignant neoplasm of breast: Secondary | ICD-10-CM

## 2021-05-29 ENCOUNTER — Emergency Department (HOSPITAL_COMMUNITY)
Admission: EM | Admit: 2021-05-29 | Discharge: 2021-05-29 | Disposition: A | Discharge status: Home / Self Care | Payer: BC Managed Care – PPO | Attending: Emergency Medicine | Admitting: Emergency Medicine

## 2021-05-29 DIAGNOSIS — Z79899 Other long term (current) drug therapy: Secondary | ICD-10-CM | POA: Insufficient documentation

## 2021-05-29 DIAGNOSIS — R42 Dizziness and giddiness: Secondary | ICD-10-CM | POA: Diagnosis not present

## 2021-05-29 DIAGNOSIS — I129 Hypertensive chronic kidney disease with stage 1 through stage 4 chronic kidney disease, or unspecified chronic kidney disease: Secondary | ICD-10-CM | POA: Diagnosis not present

## 2021-05-29 DIAGNOSIS — N183 Chronic kidney disease, stage 3 unspecified: Secondary | ICD-10-CM | POA: Diagnosis not present

## 2021-05-29 DIAGNOSIS — R002 Palpitations: Secondary | ICD-10-CM | POA: Diagnosis present

## 2021-05-29 LAB — COMPREHENSIVE METABOLIC PANEL
ALT: 16 U/L (ref 0–44)
AST: 21 U/L (ref 15–41)
Albumin: 4.2 g/dL (ref 3.5–5.0)
Alkaline Phosphatase: 50 U/L (ref 38–126)
Anion gap: 8 (ref 5–15)
BUN: 10 mg/dL (ref 6–20)
CO2: 27 mmol/L (ref 22–32)
Calcium: 9.1 mg/dL (ref 8.9–10.3)
Chloride: 104 mmol/L (ref 98–111)
Creatinine, Ser: 1.05 mg/dL — ABNORMAL HIGH (ref 0.44–1.00)
GFR, Estimated: >60 mL/min (ref >60)
Glucose, Bld: 97 mg/dL (ref 70–99)
Potassium: 3.4 mmol/L — ABNORMAL LOW (ref 3.5–5.1)
Sodium: 139 mmol/L (ref 135–145)
Total Bilirubin: 0.5 mg/dL (ref 0.3–1.2)
Total Protein: 7.9 g/dL (ref 6.5–8.1)

## 2021-05-29 LAB — CBC WITH DIFFERENTIAL/PLATELET
Abs Immature Granulocytes: 0 K/uL (ref 0.00–0.07)
Basophils Absolute: 0 K/uL (ref 0.0–0.1)
Basophils Relative: 1 %
Eosinophils Absolute: 0.2 K/uL (ref 0.0–0.5)
Eosinophils Relative: 5 %
HCT: 35.7 % — ABNORMAL LOW (ref 36.0–46.0)
Hemoglobin: 11.6 g/dL — ABNORMAL LOW (ref 12.0–15.0)
Immature Granulocytes: 0 %
Lymphocytes Relative: 38 %
Lymphs Abs: 1.4 K/uL (ref 0.7–4.0)
MCH: 28.5 pg (ref 26.0–34.0)
MCHC: 32.5 g/dL (ref 30.0–36.0)
MCV: 87.7 fL (ref 80.0–100.0)
Monocytes Absolute: 0.4 K/uL (ref 0.1–1.0)
Monocytes Relative: 11 %
Neutro Abs: 1.7 K/uL (ref 1.7–7.7)
Neutrophils Relative %: 45 %
Platelets: 191 K/uL (ref 150–400)
RBC: 4.07 MIL/uL (ref 3.87–5.11)
RDW: 13.7 % (ref 11.5–15.5)
WBC: 3.7 K/uL — ABNORMAL LOW (ref 4.0–10.5)
nRBC: 0 % (ref 0.0–0.2)

## 2021-05-29 NOTE — Discharge Instructions (Addendum)
Follow-up with your primary doctor if symptoms recur, and return to the ER if symptoms worsen or change.

## 2021-05-29 NOTE — ED Notes (Signed)
Pt discharged from this ED in stable condition at this time. All discharge instructions and follow up care reviewed with pt with no further questions at this time. Pt ambulatory with steady gait, clear speech.  

## 2021-05-29 NOTE — ED Triage Notes (Signed)
Pt c/o palpitations after waking up this morning around midnight accompanied by dizziness. Denies CP or SOB, states the dizziness is just upon exertion

## 2021-05-29 NOTE — ED Provider Notes (Signed)
[facility] DEPT Provider Note   CSN: [id number] Arrival date & time: 05/29/21  0023     History Chief Complaint  Patient presents with   Palpitations    Heidi Herrera is a 55 y.o. female.  Patient is a 55 year old female with history of hypertension, migraines, fibroids, anemia, and anxiety.  Patient presenting today for evaluation of palpitations and dizziness.  She got up from bed this evening to walk to the bathroom.  When she stood, she began to feel dizzy and felt her heart beating rapidly.  This lasted for approximately 5 minutes, then resolved.  She still feels somewhat off balance, but denies any headache, weakness, or numbness.  She denies any chest pain or difficulty breathing.  The history is provided by the patient.  Palpitations Palpitations quality:  Fast Onset quality:  Sudden Duration:  5 minutes Progression:  Resolved Chronicity:  New Relieved by:  Nothing Worsened by:  Nothing     Past Medical History:  Diagnosis Date   Acid reflux    Allergy    Anemia    Anxiety    Fibroids    GERD (gastroesophageal reflux disease)    H. pylori infection    Hypertension    Kidney failure    stage 2   Migraine    Obesity    Polycystic kidney disease    Sebaceous cyst    Sessile colonic polyp    Vertigo     Patient Active Problem List   Diagnosis Date Noted   Premenstrual tension syndrome 04/24/2020   Polycystic kidney disease 11/29/2018   Benign paroxysmal positional vertigo due to bilateral vestibular disorder 09/26/2018   Body mass index (BMI) of 32.0-32.9 in adult 01/25/2018   Adjustment reaction with anxiety and depression 01/25/2018   Amenorrhea 06/28/2017   Mastodynia of left breast 06/28/2017   CKD (chronic kidney disease) stage 3, GFR 30-59 ml/min (HCC) 04/15/2017   Fibroid 12/01/2016   Anosmia 05/01/2016   Ear itch 05/01/2016   Fibroid, uterine    Essential hypertension 09/29/2014   Chronic anemia 02/02/2014    Hypertrophic scar 05/23/2012   Seborrheic dermatitis 05/23/2012   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   VITAMIN D DEFICIENCY 12/18/2010   HYPERLIPIDEMIA 12/18/2010   NEPHROLITHIASIS 03/24/2010   HORSESHOE KIDNEY 03/24/2010   Gastroesophageal reflux disease without esophagitis 08/27/2009   FIBROADENOSIS, BREAST 02/10/2007    Past Surgical History:  Procedure Laterality Date   IR GENERIC HISTORICAL  12/01/2016   IR ANGIOGRAM PELVIS SELECTIVE OR SUPRASELECTIVE 12/01/2016 Sandi Mariscal, MD [facility]   IR GENERIC HISTORICAL  12/01/2016   IR US GUIDE VASC ACCESS RIGHT 12/01/2016 Sandi Mariscal, MD [facility]   IR GENERIC HISTORICAL  12/01/2016   IR EMBO ARTERIAL NOT HEMORR HEMANG INC GUIDE ROADMAPPING 12/01/2016 Sandi Mariscal, MD [facility]   IR GENERIC HISTORICAL  12/01/2016   IR ANGIOGRAM SELECTIVE EACH ADDITIONAL VESSEL 12/01/2016 Sandi Mariscal, MD [facility]   IR GENERIC HISTORICAL  12/01/2016   IR ANGIOGRAM SELECTIVE EACH ADDITIONAL VESSEL 12/01/2016 Sandi Mariscal, MD [facility]   IR GENERIC HISTORICAL  12/01/2016   IR ANGIOGRAM PELVIS SELECTIVE OR SUPRASELECTIVE 12/01/2016 Sandi Mariscal, MD [facility]   IR GENERIC HISTORICAL  01/07/2017   IR RADIOLOGIST EVAL & MGMT 01/07/2017 Sandi Mariscal, MD [facility]   IR RADIOLOGIST EVAL & MGMT  08/31/2017   LOBECTOMY  2009   MYOMECTOMY  2007   UTERINE FIBROID EMBOLIZATION     12/01/2016     OB  History   No obstetric history on file.     Family History  Problem Relation Age of Onset   Hypertension Mother    Obesity Mother    Stroke Mother    Diabetes Mother    Hypertension Sister    Hyperlipidemia Sister    Obesity Sister    Diabetes Sister    Heart disease Sister    Mental illness Sister    Glaucoma Sister    Hypertension Brother    Obesity Brother    Stroke Brother    Glaucoma Brother    Mental illness Father    Hypertension Father    Cancer Brother    Stroke Brother    Hypertension Sister    Mental illness Sister     Glaucoma Maternal Uncle    Cancer Sister 97   Diabetes Sister    Esophageal cancer Maternal Grandfather    Pancreatic cancer Cousin    Stomach cancer Neg Hx    Colon cancer Neg Hx     Social History   Tobacco Use   Smoking status: Never   Smokeless tobacco: Never  Vaping Use   Vaping Use: Never used  Substance Use Topics   Alcohol use: Not Currently    Alcohol/week: 1.0 standard drink    Types: 1 Glasses of wine per week    Comment: quit years ago   Drug use: No    Home Medications Prior to Admission medications   Medication Sig Start Date End Date Taking? Authorizing Provider  baclofen (LIORESAL) 10 MG tablet Take 0.5-1 tablets (5-10 mg total) by mouth 3 (three) times daily as needed for muscle spasms. 04/24/20   Hilts, Michael, MD  Biotin 1000 MCG tablet Take 1,000 mcg by mouth daily.     [provider]  calcium-vitamin D (OSCAL WITH D) 500-200 MG-UNIT per tablet Take 1 tablet by mouth 2 (two) times daily. Calcium 1000 mg with Magnesium 400 mg and zinc 15 mg, taking 1 capsule 2 times daily    [provider]  cetirizine (ZYRTEC) 10 MG tablet Take 1 tablet by mouth once daily 03/12/20   Horald Pollen, MD  Cyanocobalamin (VITAMIN B 12 PO) Take 1 tablet by mouth daily.    [provider]  IFEREX 150 150 MG capsule Take 1 capsule by mouth twice daily 07/05/19   Horald Pollen, MD  lisinopril (ZESTRIL) 20 MG tablet Take 1 tablet (20 mg total) by mouth daily. 05/21/20   Horald Pollen, MD  lisinopril-hydrochlorothiazide (ZESTORETIC) 20-25 MG tablet Take 1 tablet by mouth daily. 05/21/20   Horald Pollen, MD  omeprazole (PRILOSEC) 40 MG capsule Take 1 capsule (40 mg total) by mouth 2 (two) times daily. Patient taking differently: Take 40 mg by mouth daily.  04/12/20   Levin Erp, PA    Allergies    Bystolic [nebivolol hcl], Biaxin [clarithromycin], and Erythromycin base  Review of Systems   Review of Systems   Cardiovascular:  Positive for palpitations.  All other systems reviewed and are negative.  Physical Exam Updated Vital Signs BP 136/78   Pulse 79   Temp 97.6 F (36.4 C) (Oral)   Resp 14   Ht 5\' 5"  (1.651 m)   Wt 81.6 kg   SpO2 100%   BMI 29.95 kg/m   Physical Exam Vitals and nursing note reviewed.  Constitutional:      General: She is not in acute distress.    Appearance: She is well-developed. She is not  diaphoretic.  HENT:     Head: Normocephalic and atraumatic.  Cardiovascular:     Rate and Rhythm: Normal rate and regular rhythm.     Heart sounds: No murmur heard.   No friction rub. No gallop.  Pulmonary:     Effort: Pulmonary effort is normal. No respiratory distress.     Breath sounds: Normal breath sounds. No wheezing.  Abdominal:     General: Bowel sounds are normal. There is no distension.     Palpations: Abdomen is soft.     Tenderness: There is no abdominal tenderness.  Musculoskeletal:        General: Normal range of motion.     Cervical back: Normal range of motion and neck supple.  Skin:    General: Skin is warm and dry.  Neurological:     General: No focal deficit present.     Mental Status: She is alert and oriented to person, place, and time. Mental status is at baseline.     Cranial Nerves: No cranial nerve deficit.     Sensory: No sensory deficit.     Motor: No weakness.     Coordination: Coordination normal.    ED Results / Procedures / Treatments   Labs (all labs ordered are listed, but only abnormal results are displayed) Labs Reviewed  CBC WITH DIFFERENTIAL/PLATELET - Abnormal; Notable for the following components:      Result Value   WBC 3.7 (*)    Hemoglobin 11.6 (*)    HCT 35.7 (*)    All other components within normal limits  COMPREHENSIVE METABOLIC PANEL - Abnormal; Notable for the following components:   Potassium 3.4 (*)    Creatinine, Ser 1.05 (*)    All other components within normal limits    EKG EKG  Interpretation  Date/Time:  Thursday May 29 2021 00:35:46 EDT Ventricular Rate:  95 PR Interval:  186 QRS Duration: 96 QT Interval:  368 QTC Calculation: 463 R Axis:   10 Text Interpretation: Sinus rhythm Probable left atrial enlargement LVH with secondary repolarization abnormality Confirmed by Veryl Speak [phone]) on 05/29/2021 12:57:55 AM  Radiology No results found.  Procedures Procedures   Medications Ordered in ED Medications - No data to display  ED Course  I have reviewed the triage vital signs and the nursing notes.  Pertinent labs & imaging results that were available during my care of the patient were reviewed by me and considered in my medical decision making (see chart for details).    MDM Rules/Calculators/A&P  Patient presenting here after an episode of palpitations and dizziness as described in the HPI.  She arrives here with stable vital signs, and sinus rhythm with a normal rate and no ectopy, and laboratory studies that are unremarkable.  She has been observed here for several hours and has had no further episodes or ectopy.  Patient seems appropriate for discharge with outpatient follow-up as needed.  Final Clinical Impression(s) / ED Diagnoses Final diagnoses:  None    Rx / DC Orders ED Discharge Orders     None        Veryl Speak, MD 05/29/21 [phone]

## 2021-06-03 DIAGNOSIS — I129 Hypertensive chronic kidney disease with stage 1 through stage 4 chronic kidney disease, or unspecified chronic kidney disease: Secondary | ICD-10-CM | POA: Diagnosis not present

## 2021-06-03 DIAGNOSIS — R030 Elevated blood-pressure reading, without diagnosis of hypertension: Secondary | ICD-10-CM | POA: Diagnosis present

## 2021-06-03 DIAGNOSIS — N183 Chronic kidney disease, stage 3 unspecified: Secondary | ICD-10-CM | POA: Diagnosis not present

## 2021-06-03 DIAGNOSIS — Z79899 Other long term (current) drug therapy: Secondary | ICD-10-CM | POA: Diagnosis not present

## 2021-06-04 ENCOUNTER — Other Ambulatory Visit: Payer: Self-pay

## 2021-06-04 ENCOUNTER — Encounter (HOSPITAL_COMMUNITY): Payer: Self-pay | Admitting: Emergency Medicine

## 2021-06-04 ENCOUNTER — Emergency Department (HOSPITAL_COMMUNITY)
Admission: EM | Admit: 2021-06-04 | Discharge: 2021-06-04 | Disposition: A | Discharge status: Home / Self Care | Payer: BC Managed Care – PPO | Attending: Emergency Medicine | Admitting: Emergency Medicine

## 2021-06-04 DIAGNOSIS — R030 Elevated blood-pressure reading, without diagnosis of hypertension: Secondary | ICD-10-CM

## 2021-06-04 LAB — TSH: TSH: 2.543 u[IU]/mL (ref 0.350–4.500)

## 2021-06-04 NOTE — ED Provider Notes (Signed)
[facility] DEPT Provider Note   CSN: [id number] Arrival date & time: 06/03/21  2259     History Chief Complaint  Patient presents with   Hypertension    182/112 BP at home    Heidi Herrera is a 55 y.o. female.  Patient presents to the emergency department with a chief complaint of elevated blood pressure.  She states that she measures her blood pressure at home and found that it was high.  She states that she felt somewhat lightheaded, and measured her blood pressure 182/112.  She was seen recently for palpitations and has follow-up with cardiology.  She sees them next week.  She has been taking her blood pressure medications.  She denies any changes in her blood pressure medications.  Denies any recent illnesses.  She denies any chest pain or palpitations now.  States that the lightheadedness is improved.  The history is provided by the patient. No language interpreter was used.      Past Medical History:  Diagnosis Date   Acid reflux    Allergy    Anemia    Anxiety    Fibroids    GERD (gastroesophageal reflux disease)    H. pylori infection    Hypertension    Kidney failure    stage 2   Migraine    Obesity    Polycystic kidney disease    Sebaceous cyst    Sessile colonic polyp    Vertigo     Patient Active Problem List   Diagnosis Date Noted   Premenstrual tension syndrome 04/24/2020   Polycystic kidney disease 11/29/2018   Benign paroxysmal positional vertigo due to bilateral vestibular disorder 09/26/2018   Body mass index (BMI) of 32.0-32.9 in adult 01/25/2018   Adjustment reaction with anxiety and depression 01/25/2018   Amenorrhea 06/28/2017   Mastodynia of left breast 06/28/2017   CKD (chronic kidney disease) stage 3, GFR 30-59 ml/min (HCC) 04/15/2017   Fibroid 12/01/2016   Anosmia 05/01/2016   Ear itch 05/01/2016   Fibroid, uterine    Essential hypertension 09/29/2014   Chronic anemia 02/02/2014   Hypertrophic scar  05/23/2012   Seborrheic dermatitis 05/23/2012   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   VITAMIN D DEFICIENCY 12/18/2010   HYPERLIPIDEMIA 12/18/2010   NEPHROLITHIASIS 03/24/2010   HORSESHOE KIDNEY 03/24/2010   Gastroesophageal reflux disease without esophagitis 08/27/2009   FIBROADENOSIS, BREAST 02/10/2007    Past Surgical History:  Procedure Laterality Date   IR GENERIC HISTORICAL  12/01/2016   IR ANGIOGRAM PELVIS SELECTIVE OR SUPRASELECTIVE 12/01/2016 Sandi Mariscal, MD [facility]   IR GENERIC HISTORICAL  12/01/2016   IR US GUIDE VASC ACCESS RIGHT 12/01/2016 Sandi Mariscal, MD [facility]   IR GENERIC HISTORICAL  12/01/2016   IR EMBO ARTERIAL NOT HEMORR HEMANG INC GUIDE ROADMAPPING 12/01/2016 Sandi Mariscal, MD [facility]   IR GENERIC HISTORICAL  12/01/2016   IR ANGIOGRAM SELECTIVE EACH ADDITIONAL VESSEL 12/01/2016 Sandi Mariscal, MD [facility]   IR GENERIC HISTORICAL  12/01/2016   IR ANGIOGRAM SELECTIVE EACH ADDITIONAL VESSEL 12/01/2016 Sandi Mariscal, MD [facility]   IR GENERIC HISTORICAL  12/01/2016   IR ANGIOGRAM PELVIS SELECTIVE OR SUPRASELECTIVE 12/01/2016 Sandi Mariscal, MD [facility]   IR GENERIC HISTORICAL  01/07/2017   IR RADIOLOGIST EVAL & MGMT 01/07/2017 Sandi Mariscal, MD [facility]   IR RADIOLOGIST EVAL & MGMT  08/31/2017   LOBECTOMY  2009   MYOMECTOMY  2007   UTERINE FIBROID EMBOLIZATION     12/01/2016  OB History   No obstetric history on file.     Family History  Problem Relation Age of Onset   Hypertension Mother    Obesity Mother    Stroke Mother    Diabetes Mother    Hypertension Sister    Hyperlipidemia Sister    Obesity Sister    Diabetes Sister    Heart disease Sister    Mental illness Sister    Glaucoma Sister    Hypertension Brother    Obesity Brother    Stroke Brother    Glaucoma Brother    Mental illness Father    Hypertension Father    Cancer Brother    Stroke Brother    Hypertension Sister    Mental illness Sister    Glaucoma Maternal  Uncle    Cancer Sister 53   Diabetes Sister    Esophageal cancer Maternal Grandfather    Pancreatic cancer Cousin    Stomach cancer Neg Hx    Colon cancer Neg Hx     Social History   Tobacco Use   Smoking status: Never   Smokeless tobacco: Never  Vaping Use   Vaping Use: Never used  Substance Use Topics   Alcohol use: Not Currently    Alcohol/week: 1.0 standard drink    Types: 1 Glasses of wine per week    Comment: quit years ago   Drug use: No    Home Medications Prior to Admission medications   Medication Sig Start Date End Date Taking? Authorizing Provider  baclofen (LIORESAL) 10 MG tablet Take 0.5-1 tablets (5-10 mg total) by mouth 3 (three) times daily as needed for muscle spasms. 04/24/20   Hilts, Michael, MD  Biotin 1000 MCG tablet Take 1,000 mcg by mouth daily.     [provider]  calcium-vitamin D (OSCAL WITH D) 500-200 MG-UNIT per tablet Take 1 tablet by mouth 2 (two) times daily. Calcium 1000 mg with Magnesium 400 mg and zinc 15 mg, taking 1 capsule 2 times daily    [provider]  cetirizine (ZYRTEC) 10 MG tablet Take 1 tablet by mouth once daily 03/12/20   Horald Pollen, MD  Cyanocobalamin (VITAMIN B 12 PO) Take 1 tablet by mouth daily.    [provider]  IFEREX 150 150 MG capsule Take 1 capsule by mouth twice daily 07/05/19   Horald Pollen, MD  lisinopril (ZESTRIL) 20 MG tablet Take 1 tablet (20 mg total) by mouth daily. 05/21/20   Horald Pollen, MD  lisinopril-hydrochlorothiazide (ZESTORETIC) 20-25 MG tablet Take 1 tablet by mouth daily. 05/21/20   Horald Pollen, MD  omeprazole (PRILOSEC) 40 MG capsule Take 1 capsule (40 mg total) by mouth 2 (two) times daily. Patient taking differently: Take 40 mg by mouth daily.  04/12/20   Levin Erp, PA    Allergies    Bystolic [nebivolol hcl], Biaxin [clarithromycin], and Erythromycin base  Review of Systems   Review of Systems  All other systems reviewed  and are negative.  Physical Exam Updated Vital Signs BP (!) 154/83 (BP Location: Right Arm)   Pulse 60   Temp 98.1 F (36.7 C) (Oral)   Resp 14   Ht 5\' 5"  (1.651 m)   Wt 81.6 kg   LMP 06/03/2021 (Exact Date)   SpO2 100%   BMI 29.94 kg/m   Physical Exam Vitals and nursing note reviewed.  Constitutional:      General: She is not in acute distress.    Appearance:  She is well-developed.  HENT:     Head: Normocephalic and atraumatic.  Eyes:     Conjunctiva/sclera: Conjunctivae normal.  Cardiovascular:     Rate and Rhythm: Normal rate and regular rhythm.     Heart sounds: No murmur heard. Pulmonary:     Effort: Pulmonary effort is normal. No respiratory distress.     Breath sounds: Normal breath sounds. No wheezing.  Abdominal:     General: There is no distension.  Musculoskeletal:     Cervical back: Neck supple.     Comments: Moves all extremities  Skin:    General: Skin is warm and dry.  Neurological:     Mental Status: She is alert and oriented to person, place, and time.     Comments: CN III-XII grossly intact, normal gait, normal balance  Psychiatric:        Mood and Affect: Mood normal.        Behavior: Behavior normal.    ED Results / Procedures / Treatments   Labs (all labs ordered are listed, but only abnormal results are displayed) Labs Reviewed  TSH    EKG None  Radiology No results found.  Procedures Procedures   Medications Ordered in ED Medications - No data to display  ED Course  I have reviewed the triage vital signs and the nursing notes.  Pertinent labs & imaging results that were available during my care of the patient were reviewed by me and considered in my medical decision making (see chart for details).    MDM Rules/Calculators/A&P                          Patient here with elevated blood pressure reading.  She was seen recently for palpitations and had fairly reassuring blood work.  I will add a TSH tonight.  I think that she  is okay to go home and follow-up with her doctor.  Her blood pressure has improved while in the ED.  Her symptoms have improved.  I do not think she requires emergent work-up or hospitalization. Final Clinical Impression(s) / ED Diagnoses Final diagnoses:  Elevated blood pressure reading    Rx / DC Orders ED Discharge Orders     None        Montine Circle, PA-C 19/14/78 2956    Delora Fuel, MD 21/30/86 [phone]

## 2021-06-04 NOTE — ED Triage Notes (Signed)
Pt arriving POV with concern for high blood pressure. Recently seen for palpitations and dizziness. Denies such today.

## 2021-06-04 NOTE — Discharge Instructions (Addendum)
You may need to change your blood pressure medications.  I recommend that you talk to your doctor about this.    I added a thyroid test.  Please review this result with your doctor.

## 2021-06-26 ENCOUNTER — Emergency Department (HOSPITAL_COMMUNITY)
Admission: EM | Admit: 2021-06-26 | Discharge: 2021-06-26 | Disposition: A | Discharge status: Home / Self Care | Payer: BC Managed Care – PPO | Attending: Emergency Medicine | Admitting: Emergency Medicine

## 2021-06-26 ENCOUNTER — Emergency Department (HOSPITAL_COMMUNITY): Payer: BC Managed Care – PPO

## 2021-06-26 ENCOUNTER — Encounter (HOSPITAL_COMMUNITY): Payer: Self-pay

## 2021-06-26 ENCOUNTER — Other Ambulatory Visit: Payer: Self-pay

## 2021-06-26 DIAGNOSIS — D649 Anemia, unspecified: Secondary | ICD-10-CM | POA: Insufficient documentation

## 2021-06-26 DIAGNOSIS — R002 Palpitations: Secondary | ICD-10-CM | POA: Diagnosis present

## 2021-06-26 DIAGNOSIS — N183 Chronic kidney disease, stage 3 unspecified: Secondary | ICD-10-CM | POA: Insufficient documentation

## 2021-06-26 DIAGNOSIS — I129 Hypertensive chronic kidney disease with stage 1 through stage 4 chronic kidney disease, or unspecified chronic kidney disease: Secondary | ICD-10-CM | POA: Diagnosis not present

## 2021-06-26 DIAGNOSIS — Z79899 Other long term (current) drug therapy: Secondary | ICD-10-CM | POA: Diagnosis not present

## 2021-06-26 LAB — CBC
HCT: 36.1 % (ref 36.0–46.0)
Hemoglobin: 11.6 g/dL — ABNORMAL LOW (ref 12.0–15.0)
MCH: 28.3 pg (ref 26.0–34.0)
MCHC: 32.1 g/dL (ref 30.0–36.0)
MCV: 88 fL (ref 80.0–100.0)
Platelets: 197 10*3/uL (ref 150–400)
RBC: 4.1 MIL/uL (ref 3.87–5.11)
RDW: 13.8 % (ref 11.5–15.5)
WBC: 4.3 10*3/uL (ref 4.0–10.5)
nRBC: 0 % (ref 0.0–0.2)

## 2021-06-26 LAB — BASIC METABOLIC PANEL
Anion gap: 9 (ref 5–15)
BUN: 16 mg/dL (ref 6–20)
CO2: 29 mmol/L (ref 22–32)
Calcium: 9.5 mg/dL (ref 8.9–10.3)
Chloride: 102 mmol/L (ref 98–111)
Creatinine, Ser: 1.12 mg/dL — ABNORMAL HIGH (ref 0.44–1.00)
GFR, Estimated: 58 mL/min — ABNORMAL LOW (ref >60)
Glucose, Bld: 123 mg/dL — ABNORMAL HIGH (ref 70–99)
Potassium: 3.5 mmol/L (ref 3.5–5.1)
Sodium: 140 mmol/L (ref 135–145)

## 2021-06-26 LAB — I-STAT BETA HCG BLOOD, ED (MC, WL, AP ONLY): I-stat hCG, quantitative: <5 m[IU]/mL (ref <5)

## 2021-06-26 MED ORDER — POTASSIUM CHLORIDE CRYS ER 20 MEQ PO TBCR
40.0000 meq | EXTENDED_RELEASE_TABLET | Freq: Once | ORAL | Status: AC
  Administered 2021-06-26: 40 meq via ORAL
  Filled 2021-06-26: qty 2

## 2021-06-26 NOTE — Discharge Instructions (Addendum)
You were seen in the ER today for palpitations.  For palpitations.  Your heart monitoring and EKG were reassuring.  Your labs show that your potassium was 3.5, normal is 3.5-5.1, we have included potassium food information in your discharge instructions you were given a dose of oral potassium in the ER.  Please follow-up with your primary care provider as well as with cardiology, please call the cardiology office tomorrow to make them aware of your ER visit.  You may benefit from prolonged heart monitoring such as a Holter monitor, please discuss this with your cardiologist.  Return to the emergency department for any new or worsening symptoms including but not limited to passing out, chest pain, trouble breathing, abnormal heartbeat sensation, or any other concerns

## 2021-06-26 NOTE — ED Provider Notes (Signed)
[facility] DEPT Provider Note   CSN: [id number] Arrival date & time: 06/26/21  0019     History Chief Complaint  Patient presents with   Palpitations    Heidi Herrera is a 55 y.o. female with a hx of hypertension, anxiety, anemia, GERD, & polycystic kidney disease who presents to the ED with complaints of palpitations that began at 2230 this evening. Patient states that she was sleeping when she developed palpitations which she describes as being able to feel her heartbeat when she got up to go to the bathroom.  This persisted with some mild lightheadedness (much less so than prior episodes) prompting emergency department visit.  No specific alleviating or aggravating factors.  She has had 2 similar episodes for which she has been seen in the emergency department previously, she saw cardiologist Dr. Brigitte Pulse at the end of June and had a reassuring echocardiogram per her report, she was started on diltiazem with plans for follow-up next week.  She denies syncope, chest pain, dyspnea, hemoptysis, fever, chills, nausea, vomiting, or abdominal pain.  HPI     Past Medical History:  Diagnosis Date   Acid reflux    Allergy    Anemia    Anxiety    Fibroids    GERD (gastroesophageal reflux disease)    H. pylori infection    Hypertension    Kidney failure    stage 2   Migraine    Obesity    Polycystic kidney disease    Sebaceous cyst    Sessile colonic polyp    Vertigo     Patient Active Problem List   Diagnosis Date Noted   Premenstrual tension syndrome 04/24/2020   Polycystic kidney disease 11/29/2018   Benign paroxysmal positional vertigo due to bilateral vestibular disorder 09/26/2018   Body mass index (BMI) of 32.0-32.9 in adult 01/25/2018   Adjustment reaction with anxiety and depression 01/25/2018   Amenorrhea 06/28/2017   Mastodynia of left breast 06/28/2017   CKD (chronic kidney disease) stage 3, GFR 30-59 ml/min (HCC) 04/15/2017   Fibroid  12/01/2016   Anosmia 05/01/2016   Ear itch 05/01/2016   Fibroid, uterine    Essential hypertension 09/29/2014   Chronic anemia 02/02/2014   Hypertrophic scar 05/23/2012   Seborrheic dermatitis 05/23/2012   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   VITAMIN D DEFICIENCY 12/18/2010   HYPERLIPIDEMIA 12/18/2010   NEPHROLITHIASIS 03/24/2010   HORSESHOE KIDNEY 03/24/2010   Gastroesophageal reflux disease without esophagitis 08/27/2009   FIBROADENOSIS, BREAST 02/10/2007    Past Surgical History:  Procedure Laterality Date   IR GENERIC HISTORICAL  12/01/2016   IR ANGIOGRAM PELVIS SELECTIVE OR SUPRASELECTIVE 12/01/2016 Sandi Mariscal, MD [facility]   IR GENERIC HISTORICAL  12/01/2016   IR US GUIDE VASC ACCESS RIGHT 12/01/2016 Sandi Mariscal, MD [facility]   IR GENERIC HISTORICAL  12/01/2016   IR EMBO ARTERIAL NOT HEMORR HEMANG INC GUIDE ROADMAPPING 12/01/2016 Sandi Mariscal, MD [facility]   IR GENERIC HISTORICAL  12/01/2016   IR ANGIOGRAM SELECTIVE EACH ADDITIONAL VESSEL 12/01/2016 Sandi Mariscal, MD [facility]   IR GENERIC HISTORICAL  12/01/2016   IR ANGIOGRAM SELECTIVE EACH ADDITIONAL VESSEL 12/01/2016 Sandi Mariscal, MD [facility]   IR GENERIC HISTORICAL  12/01/2016   IR ANGIOGRAM PELVIS SELECTIVE OR SUPRASELECTIVE 12/01/2016 Sandi Mariscal, MD [facility]   IR GENERIC HISTORICAL  01/07/2017   IR RADIOLOGIST EVAL & MGMT 01/07/2017 Sandi Mariscal, MD [facility]   IR RADIOLOGIST EVAL & MGMT  08/31/2017  LOBECTOMY  2009   MYOMECTOMY  2007   UTERINE FIBROID EMBOLIZATION     12/01/2016     OB History   No obstetric history on file.     Family History  Problem Relation Age of Onset   Hypertension Mother    Obesity Mother    Stroke Mother    Diabetes Mother    Hypertension Sister    Hyperlipidemia Sister    Obesity Sister    Diabetes Sister    Heart disease Sister    Mental illness Sister    Glaucoma Sister    Hypertension Brother    Obesity Brother    Stroke Brother    Glaucoma Brother     Mental illness Father    Hypertension Father    Cancer Brother    Stroke Brother    Hypertension Sister    Mental illness Sister    Glaucoma Maternal Uncle    Cancer Sister 31   Diabetes Sister    Esophageal cancer Maternal Grandfather    Pancreatic cancer Cousin    Stomach cancer Neg Hx    Colon cancer Neg Hx     Social History   Tobacco Use   Smoking status: Never   Smokeless tobacco: Never  Vaping Use   Vaping Use: Never used  Substance Use Topics   Alcohol use: Not Currently    Alcohol/week: 1.0 standard drink    Types: 1 Glasses of wine per week    Comment: quit years ago   Drug use: No    Home Medications Prior to Admission medications   Medication Sig Start Date End Date Taking? Authorizing Provider  baclofen (LIORESAL) 10 MG tablet Take 0.5-1 tablets (5-10 mg total) by mouth 3 (three) times daily as needed for muscle spasms. 04/24/20   Hilts, Michael, MD  Biotin 1000 MCG tablet Take 1,000 mcg by mouth daily.     [provider]  calcium-vitamin D (OSCAL WITH D) 500-200 MG-UNIT per tablet Take 1 tablet by mouth 2 (two) times daily. Calcium 1000 mg with Magnesium 400 mg and zinc 15 mg, taking 1 capsule 2 times daily    [provider]  cetirizine (ZYRTEC) 10 MG tablet Take 1 tablet by mouth once daily 03/12/20   Horald Pollen, MD  Cyanocobalamin (VITAMIN B 12 PO) Take 1 tablet by mouth daily.    [provider]  IFEREX 150 150 MG capsule Take 1 capsule by mouth twice daily 07/05/19   Horald Pollen, MD  lisinopril (ZESTRIL) 20 MG tablet Take 1 tablet (20 mg total) by mouth daily. 05/21/20   Horald Pollen, MD  lisinopril-hydrochlorothiazide (ZESTORETIC) 20-25 MG tablet Take 1 tablet by mouth daily. 05/21/20   Horald Pollen, MD  omeprazole (PRILOSEC) 40 MG capsule Take 1 capsule (40 mg total) by mouth 2 (two) times daily. Patient taking differently: Take 40 mg by mouth daily.  04/12/20   Levin Erp, PA     Allergies    Bystolic [nebivolol hcl], Biaxin [clarithromycin], and Erythromycin base  Review of Systems   Review of Systems  Constitutional:  Negative for chills and fever.  Eyes:  Negative for visual disturbance.  Respiratory:  Negative for shortness of breath.   Cardiovascular:  Positive for palpitations. Negative for chest pain and leg swelling.  Gastrointestinal:  Negative for abdominal pain, nausea and vomiting.  Neurological:  Positive for light-headedness. Negative for syncope and headaches.  All other systems reviewed and are negative.  Physical Exam  Updated Vital Signs BP (!) 145/98 (BP Location: Left Arm)   Pulse 91   Temp 98.2 F (36.8 C) (Oral)   Resp 17   Ht 5\' 5"  (1.651 m)   Wt 81.6 kg   LMP 06/03/2021 (Exact Date)   SpO2 98%   BMI 29.95 kg/m   Physical Exam Vitals and nursing note reviewed.  Constitutional:      General: She is not in acute distress.    Appearance: She is well-developed. She is not toxic-appearing.  HENT:     Head: Normocephalic and atraumatic.  Eyes:     General:        Right eye: No discharge.        Left eye: No discharge.     Conjunctiva/sclera: Conjunctivae normal.  Cardiovascular:     Rate and Rhythm: Normal rate and regular rhythm.     Comments: 2+ symmetric pulses.  Pulmonary:     Effort: Pulmonary effort is normal. No respiratory distress.     Breath sounds: Normal breath sounds. No wheezing, rhonchi or rales.  Abdominal:     General: There is no distension.     Palpations: Abdomen is soft.     Tenderness: There is no abdominal tenderness.  Musculoskeletal:        General: No tenderness.     Cervical back: Neck supple.     Right lower leg: No edema.     Left lower leg: No edema.  Skin:    General: Skin is warm and dry.     Findings: No rash.  Neurological:     Mental Status: She is alert.     Comments: Clear speech.   Psychiatric:        Behavior: Behavior normal.    ED Results / Procedures / Treatments    Labs (all labs ordered are listed, but only abnormal results are displayed) Labs Reviewed  BASIC METABOLIC PANEL - Abnormal; Notable for the following components:      Result Value   Glucose, Bld 123 (*)    Creatinine, Ser 1.12 (*)    GFR, Estimated 58 (*)    All other components within normal limits  CBC - Abnormal; Notable for the following components:   Hemoglobin 11.6 (*)    All other components within normal limits  I-STAT BETA HCG BLOOD, ED ([facility], [facility], [facility] ONLY)    EKG EKG Interpretation  Date/Time:  Thursday June 26 2021 00:59:56 EDT Ventricular Rate:  76 PR Interval:  179 QRS Duration: 92 QT Interval:  383 QTC Calculation: 431 R Axis:   32 Text Interpretation: Sinus rhythm Consider left ventricular hypertrophy Confirmed by Gerlene Fee [id number]) on 06/26/2021 2:37:52 AM  Radiology DG Chest 2 View  Result Date: 06/26/2021 CLINICAL DATA:  55 year old female with palpitation EXAM: CHEST - 2 VIEW COMPARISON:  Chest radiograph dated 07/25/2018. FINDINGS: Minimal right lung base linear atelectasis/scarring. No focal consolidation, pleural effusion, or pneumothorax. The cardiac silhouette is within limits. No acute osseous pathology. IMPRESSION: No active cardiopulmonary disease. Electronically Signed   By: Anner Crete M.D.   On: 06/26/2021 01:08    Procedures Procedures  2:54 AM Cardiac monitoring reveals sinus rhythm @ rate of 67 bpm, as reviewed and interpreted by me. Cardiac monitoring was ordered due to palpitations and to monitor patient for dysrhythmia.  Medications Ordered in ED Medications  potassium chloride SA (KLOR-CON) CR tablet 40 mEq (40 mEq Oral Given 06/26/21 9147)    ED Course  I have reviewed the triage  vital signs and the nursing notes.  Pertinent labs & imaging results that were available during my care of the patient were reviewed by me and considered in my medical decision making (see chart for details).    MDM Rules/Calculators/A&P                           Patient presents to the ED with complaints of palpitations.  She is nontoxic, resting comfortably, vitals without significant abnormality. Heart RRR. Sinus rhythm on arrival.   Additional history obtained:  Additional history obtained from chart review & nursing note review.  Similar ED visits x 2 in June.   EKG: Sinus rhythm Consider left ventricular hypertrophy   Lab Tests:  I Ordered, reviewed, and interpreted labs, which included:  CBC: Mild anemia similar to prior.  BMP: creatinine similar to prior. No significant electrolyte derangement, potassium lower limit of normal.   Imaging Studies ordered:  CXR ordered by triage, I independently reviewed, formal radiology impression shows:  No active cardiopulmonary disease  ED Course:  Overall reassuring work-up in the emergency department.  Cardiac monitor reviewed, remains in sinus rhythm without bradycardia, tachycardia, or significant arrhythmia.  Her labs are overall reassuring, we discussed her electrolytes that she was concerned about this given she follows a vegan based diet, her potassium is within the lower limit of normal range, will provide potassium supplementation in the emergency department with some diet recommendations.  Overall patient appears appropriate for discharge, she is ambulatory, no acute distress, and has remained in sinus rhythm on the monitor.  We discussed calling her cardiologist tomorrow morning to make them aware of her emergency department visit and also to discuss potential longer term monitoring.   I discussed results, treatment plan, need for follow-up, and return precautions with the patient. Provided opportunity for questions, patient confirmed understanding and is in agreement with plan.   Portions of this note were generated with [employer]. Dictation errors may occur despite best attempts at proofreading.  Final Clinical Impression(s) / ED Diagnoses Final diagnoses:   Palpitations    Rx / DC Orders ED Discharge Orders     None        Amaryllis Dyke, PA-C 06/26/21 [id number]    Maudie Flakes, MD 06/26/21 [phone]

## 2021-06-26 NOTE — ED Triage Notes (Signed)
Pt reports this is her third time coming to ER for the same symptoms of heart palpitations and dizziness.  Pt stated today these symptoms started around 2230 tonight.  Pt reports she has a hx of high blood pressure.

## 2021-07-09 ENCOUNTER — Other Ambulatory Visit: Payer: Self-pay

## 2021-07-09 ENCOUNTER — Encounter: Payer: Self-pay | Admitting: Cardiology

## 2021-07-09 ENCOUNTER — Ambulatory Visit: Payer: BC Managed Care – PPO | Admitting: Cardiology

## 2021-07-09 VITALS — BP 150/94 | HR 68 | Ht 65.0 in | Wt 185.1 lb

## 2021-07-09 DIAGNOSIS — I10 Essential (primary) hypertension: Secondary | ICD-10-CM

## 2021-07-09 DIAGNOSIS — R0600 Dyspnea, unspecified: Secondary | ICD-10-CM | POA: Diagnosis not present

## 2021-07-09 DIAGNOSIS — I7090 Unspecified atherosclerosis: Secondary | ICD-10-CM | POA: Insufficient documentation

## 2021-07-09 DIAGNOSIS — I700 Atherosclerosis of aorta: Secondary | ICD-10-CM

## 2021-07-09 DIAGNOSIS — D509 Iron deficiency anemia, unspecified: Secondary | ICD-10-CM | POA: Insufficient documentation

## 2021-07-09 DIAGNOSIS — K625 Hemorrhage of anus and rectum: Secondary | ICD-10-CM | POA: Insufficient documentation

## 2021-07-09 DIAGNOSIS — Z800 Family history of malignant neoplasm of digestive organs: Secondary | ICD-10-CM | POA: Insufficient documentation

## 2021-07-09 DIAGNOSIS — R0609 Other forms of dyspnea: Secondary | ICD-10-CM

## 2021-07-09 DIAGNOSIS — K5900 Constipation, unspecified: Secondary | ICD-10-CM | POA: Insufficient documentation

## 2021-07-09 DIAGNOSIS — R110 Nausea: Secondary | ICD-10-CM | POA: Insufficient documentation

## 2021-07-09 DIAGNOSIS — R634 Abnormal weight loss: Secondary | ICD-10-CM | POA: Insufficient documentation

## 2021-07-09 DIAGNOSIS — K602 Anal fissure, unspecified: Secondary | ICD-10-CM | POA: Insufficient documentation

## 2021-07-09 NOTE — Patient Instructions (Signed)
Medication Instructions:  No medication changes. *If you need a refill on your cardiac medications before your next appointment, please call your pharmacy*   Lab Work: Your physician recommends that you have a BMET done today in the office.  If you have labs (blood work) drawn today and your tests are completely normal, you will receive your results only by: [facility] (if you have MyChart) OR A paper copy in the mail If you have any lab test that is abnormal or we need to change your treatment, we will call you to review the results.   Testing/Procedures: Your physician has requested that you have a lexiscan myoview. For further information please visit [email or web address]. Please follow instruction sheet, as given.  The test will take approximately 3 to 4 hours to complete; you may bring reading material.  If someone comes with you to your appointment, they will need to remain in the main lobby due to limited space in the testing area. **If you are pregnant or breastfeeding, please notify the nuclear lab prior to your appointment**  How to prepare for your Myocardial Perfusion Test: Do not eat or drink 3 hours prior to your test, except you may have water. Do not consume products containing caffeine (regular or decaffeinated) 12 hours prior to your test. (ex: coffee, chocolate, sodas, tea). Do bring a list of your current medications with you.  If not listed below, you may take your medications as normal. Do wear comfortable clothes (no dresses or overalls) and walking shoes, tennis shoes preferred (No heels or open toe shoes are allowed). Do NOT wear cologne, perfume, aftershave, or lotions (deodorant is allowed). If these instructions are not followed, your test will have to be rescheduled.    Follow-Up: At [facility], you and your health needs are our priority.  As part of our continuing mission to provide you with exceptional heart care, we have created designated  Provider Care Teams.  These Care Teams include your primary Cardiologist (physician) and Advanced Practice Providers (APPs -  Physician Assistants and Nurse Practitioners) who all work together to provide you with the care you need, when you need it.  We recommend signing up for the patient portal called "MyChart".  Sign up information is provided on this After Visit Summary.  MyChart is used to connect with patients for Virtual Visits (Telemedicine).  Patients are able to view lab/test results, encounter notes, upcoming appointments, etc.  Non-urgent messages can be sent to your provider as well.   To learn more about what you can do with MyChart, go to [email or web address].    Your next appointment:   1 month(s)  The format for your next appointment:   In Person  Provider:   Jyl Heinz, MD   Other Instructions Cardiac Nuclear Scan A cardiac nuclear scan is a test that is done to check the flow of blood to your heart. It is done when you are resting and when you are exercising. The test looks for problems such as: Not enough blood reaching a portion of the heart. The heart muscle not working as it should. You may need this test if: You have heart disease. You have had lab results that are not normal. You have had heart surgery or a balloon procedure to open up blocked arteries (angioplasty). You have chest pain. You have shortness of breath. In this test, a special dye (tracer) is put into your bloodstream. The tracer will travel to your heart. A camera will then take pictures  of your heart to see how the tracer moves through your heart. This test is usually done at a hospital and takes 2-4 hours. Tell a doctor about: Any allergies you have. All medicines you are taking, including vitamins, herbs, eye drops, creams, and over-the-counter medicines. Any problems you or family members have had with anesthetic medicines. Any blood disorders you have. Any surgeries you have  had. Any medical conditions you have. Whether you are pregnant or may be pregnant. What are the risks? Generally, this is a safe test. However, problems may occur, such as: Serious chest pain and heart attack. This is only a risk if the stress portion of the test is done. Rapid heartbeat. A feeling of warmth in your chest. This feeling usually does not last long. Allergic reaction to the tracer. What happens before the test? Ask your doctor about changing or stopping your normal medicines. This is important. Follow instructions from your doctor about what you cannot eat or drink. Remove your jewelry on the day of the test. What happens during the test? An IV tube will be inserted into one of your veins. Your doctor will give you a small amount of tracer through the IV tube. You will wait for 20-40 minutes while the tracer moves through your bloodstream. Your heart will be monitored with an electrocardiogram (ECG). You will lie down on an exam table. Pictures of your heart will be taken for about 15-20 minutes. You may also have a stress test. For this test, one of these things may be done: You will be asked to exercise on a treadmill or a stationary bike. You will be given medicines that will make your heart work harder. This is done if you are unable to exercise. When blood flow to your heart has peaked, a tracer will again be given through the IV tube. After 20-40 minutes, you will get back on the exam table. More pictures will be taken of your heart. Depending on the tracer that is used, more pictures may need to be taken 3-4 hours later. Your IV tube will be removed when the test is over. The test may vary among doctors and hospitals. What happens after the test? Ask your doctor: Whether you can return to your normal schedule, including diet, activities, and medicines. Whether you should drink more fluids. This will help to remove the tracer from your body. Drink enough fluid to  keep your pee (urine) pale yellow. Ask your doctor, or the department that is doing the test: When will my results be ready? How will I get my results? Summary A cardiac nuclear scan is a test that is done to check the flow of blood to your heart. Tell your doctor whether you are pregnant or may be pregnant. Before the test, ask your doctor about changing or stopping your normal medicines. This is important. Ask your doctor whether you can return to your normal activities. You may be asked to drink more fluids. This information is not intended to replace advice given to you by your health care provider. Make sure you discuss any questions you have with your health care provider. Document Revised: 03/22/2019 Document Reviewed: 05/16/2018 Elsevier Patient Education  [place].

## 2021-07-09 NOTE — Progress Notes (Signed)
Cardiology Office Note:    Date:  07/09/2021   ID:  Heidi Herrera, DOB 1966-09-16, MRN [id number]  PCP:  System, Provider Not In  Cardiologist:  Jenean Lindau, MD   Referring MD: No ref. provider found    ASSESSMENT:    1. DOE (dyspnea on exertion)   2. Essential hypertension   3. Atherosclerotic vascular disease   4. Dyspnea on exertion   5. Abdominal aortic atherosclerosis ([place])    PLAN:    In order of problems listed above:  Atherosclerotic vascular disease: Patient had CT scan in the past and revealed abdominal aorta aortic atherosclerosis.  Secondary prevention stressed to the patient.  Importance of compliance with diet medication stressed and she vocalized understanding echocardiogram report was reviewed and it was unremarkable.  She will need to be on lipid-lowering therapy in view of this but I would suggest to the discretion of her primary care provider.  I discussed this with him and she would take care of this aspect of her care according to what she tells me. Dizziness: We will do an assessment of her monitoring.  We are awaiting report from her ZIO monitoring done at outside clinic. Dyspnea on exertion: Patient has multiple risk factors for coronary artery disease and we will do an exercise stress Cardiolite to understand this.  We are looking for possibility of obstructive coronary artery disease causing the symptoms. Hypokalemia: This was noted on last evaluation and will do a Chem-7 today. Patient will be seen in follow-up appointment in 6 months or earlier if the patient has any concerns    Medication Adjustments/Labs and Tests Ordered: Current medicines are reviewed at length with the patient today.  Concerns regarding medicines are outlined above.  Orders Placed This Encounter  Procedures   Basic metabolic panel   MYOCARDIAL PERFUSION IMAGING   No orders of the defined types were placed in this encounter.    History of Present Illness:    Heidi Herrera is a 55 y.o. female who is being seen today for the evaluation of dyspnea on exertion and dizzy spells.  Patient is a pleasant 55 year old female.  She has past medical history of essential hypertension and polycystic kidney disease.  She has been evaluated for dizziness and shortness of breath on exertion.  The patient mentions to me that her echocardiogram is fine and she is brought in the report.  Is under 170 monitoring and we are expecting the results from primary care.  She denies any chest pain orthopnea or PND.  She has dyspnea on exertion.  She is here to seek evaluation for this.  At the time of my evaluation, the patient is alert awake oriented and in no distress.  Past Medical History:  Diagnosis Date   Acid reflux    Allergy    Anemia    Anxiety    Fibroids    GERD (gastroesophageal reflux disease)    H. pylori infection    Hypertension    Kidney failure    stage 2   Migraine    Obesity    Polycystic kidney disease    Sebaceous cyst    Sessile colonic polyp    Vertigo     Past Surgical History:  Procedure Laterality Date   IR GENERIC HISTORICAL  12/01/2016   IR ANGIOGRAM PELVIS SELECTIVE OR SUPRASELECTIVE 12/01/2016 Sandi Mariscal, MD [facility]   IR GENERIC HISTORICAL  12/01/2016   IR US GUIDE VASC ACCESS RIGHT 12/01/2016 Sandi Mariscal,  MD [facility]   IR GENERIC HISTORICAL  12/01/2016   IR EMBO ARTERIAL NOT HEMORR HEMANG INC GUIDE ROADMAPPING 12/01/2016 Sandi Mariscal, MD [facility]   IR GENERIC HISTORICAL  12/01/2016   IR ANGIOGRAM SELECTIVE EACH ADDITIONAL VESSEL 12/01/2016 Sandi Mariscal, MD [facility]   IR GENERIC HISTORICAL  12/01/2016   IR ANGIOGRAM SELECTIVE EACH ADDITIONAL VESSEL 12/01/2016 Sandi Mariscal, MD [facility]   IR GENERIC HISTORICAL  12/01/2016   IR ANGIOGRAM PELVIS SELECTIVE OR SUPRASELECTIVE 12/01/2016 Sandi Mariscal, MD [facility]   IR GENERIC HISTORICAL  01/07/2017   IR RADIOLOGIST EVAL & MGMT 01/07/2017 Sandi Mariscal, MD [facility]   IR  RADIOLOGIST EVAL & MGMT  08/31/2017   LOBECTOMY  2009   MYOMECTOMY  2007   UTERINE FIBROID EMBOLIZATION     12/01/2016    Current Medications: Current Meds  Medication Sig   Biotin 1000 MCG tablet Take 1,000 mcg by mouth daily.    calcium-vitamin D (OSCAL WITH D) 500-200 MG-UNIT per tablet Take 1 tablet by mouth 2 (two) times daily. Calcium 1000 mg with Magnesium 400 mg and zinc 15 mg, taking 1 capsule 2 times daily   Cyanocobalamin (VITAMIN B 12 PO) Take 1 tablet by mouth daily.   DILT-XR 120 MG 24 hr capsule Take 120 mg by mouth daily.   FERROUS FUMARATE PO Take 15 mLs by mouth daily.   lisinopril-hydrochlorothiazide (ZESTORETIC) 20-25 MG tablet Take 1 tablet by mouth daily.     Allergies:   Bystolic [nebivolol hcl], Biaxin [clarithromycin], and Erythromycin base   Social History   Socioeconomic History   Marital status: Divorced    Spouse name: Not on file   Number of children: 1   Years of education: Not on file   Highest education level: Master's degree (e.g., MA, MS, MEng, MEd, MSW, MBA)  Occupational History    Comment: [employer]  Tobacco Use   Smoking status: Never   Smokeless tobacco: Never  Vaping Use   Vaping Use: Never used  Substance and Sexual Activity   Alcohol use: Not Currently    Alcohol/week: 1.0 standard drink    Types: 1 Glasses of wine per week    Comment: quit years ago   Drug use: No   Sexual activity: Not on file  Other Topics Concern   Not on file  Social History Narrative   Lives alone   No caffiene   Social Determinants of Health   Financial Resource Strain: Not on file  Food Insecurity: Not on file  Transportation Needs: Not on file  Physical Activity: Not on file  Stress: Not on file  Social Connections: Not on file     Family History: The patient's family history includes Cancer in her brother; Cancer (age of onset: 57) in her sister; Diabetes in her mother, sister, and sister; Esophageal cancer in her maternal grandfather;  Glaucoma in her brother, maternal uncle, and sister; Heart disease in her sister; Hyperlipidemia in her sister; Hypertension in her brother, father, mother, sister, and sister; Mental illness in her father, sister, and sister; Obesity in her brother, mother, and sister; Pancreatic cancer in her cousin; Stroke in her brother, brother, and mother. There is no history of Stomach cancer or Colon cancer.  ROS:   Please see the history of present illness.    All other systems reviewed and are negative.  EKGs/Labs/Other Studies Reviewed:    The following studies were reviewed today: EKG reveals sinus rhythm and nonspecific ST-T changes  and emergency room records were reviewed.   Recent Labs: 05/29/2021: ALT 16 06/04/2021: TSH 2.543 06/26/2021: BUN 16; Creatinine, Ser 1.12; Hemoglobin 11.6; Platelets 197; Potassium 3.5; Sodium 140  Recent Lipid Panel    Component Value Date/Time   CHOL 164 02/08/2020 1500   TRIG 77 02/08/2020 1500   HDL 49 02/08/2020 1500   CHOLHDL 3.3 02/08/2020 1500   CHOLHDL 4.1 01/05/2014 0823   VLDL 12 01/05/2014 0823   LDLCALC 100 (H) 02/08/2020 1500   LDLDIRECT 148.8 11/26/2008 1223    Physical Exam:    VS:  BP (!) 150/94   Pulse 68   Ht '5\' 5"'$  (1.651 m)   Wt 185 lb 1.3 oz (84 kg)   SpO2 99%   BMI 30.80 kg/m     Wt Readings from Last 3 Encounters:  07/09/21 185 lb 1.3 oz (84 kg)  06/26/21 180 lb (81.6 kg)  06/04/21 179 lb 14.3 oz (81.6 kg)     GEN: Patient is in no acute distress HEENT: Normal NECK: No JVD; No carotid bruits LYMPHATICS: No lymphadenopathy CARDIAC: S1 S2 regular, 2/6 systolic murmur at the apex. RESPIRATORY:  Clear to auscultation without rales, wheezing or rhonchi  ABDOMEN: Soft, non-tender, non-distended MUSCULOSKELETAL:  No edema; No deformity  SKIN: Warm and dry NEUROLOGIC:  Alert and oriented x 3 PSYCHIATRIC:  Normal affect    Signed, Jenean Lindau, MD  07/09/2021 11:13 AM    [facility]

## 2021-07-10 ENCOUNTER — Telehealth (HOSPITAL_COMMUNITY): Payer: Self-pay | Admitting: *Deleted

## 2021-07-10 LAB — BASIC METABOLIC PANEL
BUN/Creatinine Ratio: 15 (ref 9–23)
BUN: 17 mg/dL (ref 6–24)
CO2: 27 mmol/L (ref 20–29)
Calcium: 9.3 mg/dL (ref 8.7–10.2)
Chloride: 106 mmol/L (ref 96–106)
Creatinine, Ser: 1.11 mg/dL — ABNORMAL HIGH (ref 0.57–1.00)
Glucose: 74 mg/dL (ref 65–99)
Potassium: 4.6 mmol/L (ref 3.5–5.2)
Sodium: 148 mmol/L — ABNORMAL HIGH (ref 134–144)
eGFR: 58 mL/min/{1.73_m2} — ABNORMAL LOW (ref >59)

## 2021-07-10 NOTE — Telephone Encounter (Signed)
Close encounter 

## 2021-07-11 ENCOUNTER — Ambulatory Visit (HOSPITAL_COMMUNITY)
Admission: RE | Admit: 2021-07-11 | Discharge: 2021-07-11 | Disposition: A | Discharge status: Home / Self Care | Payer: BC Managed Care – PPO | Source: Ambulatory Visit | Attending: Cardiovascular Disease | Admitting: Cardiovascular Disease

## 2021-07-11 ENCOUNTER — Other Ambulatory Visit: Payer: Self-pay

## 2021-07-11 DIAGNOSIS — R0600 Dyspnea, unspecified: Secondary | ICD-10-CM | POA: Diagnosis not present

## 2021-07-11 DIAGNOSIS — R0609 Other forms of dyspnea: Secondary | ICD-10-CM

## 2021-07-11 LAB — MYOCARDIAL PERFUSION IMAGING
Estimated workload: 12.5 METS
Exercise duration (min): 10 min
Exercise duration (sec): 31 s
LV dias vol: 99 mL (ref 46–106)
LV sys vol: 41 mL
MPHR: 164 {beats}/min
Peak HR: 171 {beats}/min
Percent HR: 104 %
Rest HR: 51 {beats}/min
SDS: 1
SRS: 0
SSS: 1
TID: 0.83

## 2021-07-11 MED ORDER — TECHNETIUM TC 99M TETROFOSMIN IV KIT
10.2000 | PACK | Freq: Once | INTRAVENOUS | Status: AC | PRN
  Administered 2021-07-11: 10.2 via INTRAVENOUS
  Filled 2021-07-11: qty 11

## 2021-07-11 MED ORDER — TECHNETIUM TC 99M TETROFOSMIN IV KIT
31.5000 | PACK | Freq: Once | INTRAVENOUS | Status: AC | PRN
  Administered 2021-07-11: 31.5 via INTRAVENOUS
  Filled 2021-07-11: qty 32

## 2021-07-14 ENCOUNTER — Telehealth: Payer: Self-pay | Admitting: Cardiology

## 2021-07-14 NOTE — Telephone Encounter (Signed)
Spoke with patient regarding results and recommendation.  Patient verbalizes understanding and is agreeable to plan of care. Advised patient to call back with any issues or concerns.  

## 2021-07-14 NOTE — Telephone Encounter (Signed)
Patient was returning a call from Friday 07/11/21 to discuss test results. Please call back

## 2021-07-17 ENCOUNTER — Telehealth: Payer: Self-pay | Admitting: Cardiology

## 2021-07-17 NOTE — Telephone Encounter (Signed)
Pt is returning call from a few days ago. Please advise pt further

## 2021-07-17 NOTE — Telephone Encounter (Signed)
Patient called and no answer. Will attempt to call back later.

## 2021-07-28 DIAGNOSIS — K635 Polyp of colon: Secondary | ICD-10-CM | POA: Insufficient documentation

## 2021-07-28 DIAGNOSIS — N19 Unspecified kidney failure: Secondary | ICD-10-CM | POA: Insufficient documentation

## 2021-07-28 DIAGNOSIS — L723 Sebaceous cyst: Secondary | ICD-10-CM | POA: Insufficient documentation

## 2021-07-28 DIAGNOSIS — G43909 Migraine, unspecified, not intractable, without status migrainosus: Secondary | ICD-10-CM | POA: Insufficient documentation

## 2021-07-28 DIAGNOSIS — T7840XA Allergy, unspecified, initial encounter: Secondary | ICD-10-CM | POA: Insufficient documentation

## 2021-07-28 DIAGNOSIS — A048 Other specified bacterial intestinal infections: Secondary | ICD-10-CM | POA: Insufficient documentation

## 2021-07-28 DIAGNOSIS — E669 Obesity, unspecified: Secondary | ICD-10-CM | POA: Insufficient documentation

## 2021-07-30 ENCOUNTER — Encounter: Payer: Self-pay | Admitting: Cardiology

## 2021-07-30 ENCOUNTER — Other Ambulatory Visit: Payer: Self-pay

## 2021-07-30 ENCOUNTER — Ambulatory Visit: Payer: BC Managed Care – PPO | Admitting: Cardiology

## 2021-07-30 VITALS — BP 138/84 | HR 82 | Ht 65.0 in | Wt 184.1 lb

## 2021-07-30 DIAGNOSIS — I7090 Unspecified atherosclerosis: Secondary | ICD-10-CM | POA: Diagnosis not present

## 2021-07-30 DIAGNOSIS — I10 Essential (primary) hypertension: Secondary | ICD-10-CM | POA: Diagnosis not present

## 2021-07-30 DIAGNOSIS — I700 Atherosclerosis of aorta: Secondary | ICD-10-CM

## 2021-07-30 MED ORDER — LISINOPRIL 40 MG PO TABS: 40.0000 mg | ORAL_TABLET | Freq: Every day | ORAL | 3 refills | Status: DC

## 2021-07-30 NOTE — Progress Notes (Signed)
Cardiology Office Note:    Date:  07/30/2021   ID:  Heidi Herrera, DOB 06-Sep-1966, MRN [id number]  PCP:  System, Provider Not In  Cardiologist:  Jenean Lindau, MD   Referring MD: No ref. provider found    ASSESSMENT:    1. Essential hypertension   2. Atherosclerotic vascular disease   3. Abdominal aortic atherosclerosis ([place])    PLAN:    In order of problems listed above:  Atherosclerotic vascular disease: Secondary prevention stressed with the patient.  Importance of compliance with diet medication stressed and she vocalized understanding.  She was advised to walk on a regular basis half an hour a day 5 days a week and she promises to do so. Essential hypertension: Blood pressure stable and diet was emphasized.  In view of her symptoms I have changed her medications at dropped her hydrochlorothiazide.  I have doubled her lisinopril to 40 mg daily should be back in a week for blood work and will be also bring your blood pressure and pulse log.  Lifestyle modification and diet was emphasized and she vocalized understanding. Mixed dyslipidemia: She will be back in the next few days for lipid liver check also and we will initiate her on statin therapy as she has abdominal aortic atherosclerosis. Patient will be seen in follow-up appointment in 6 months or earlier if the patient has any concerns.  Patient had multiple questions which were answered to her satisfaction.  Medication Adjustments/Labs and Tests Ordered: Current medicines are reviewed at length with the patient today.  Concerns regarding medicines are outlined above.  No orders of the defined types were placed in this encounter.  No orders of the defined types were placed in this encounter.    No chief complaint on file.    History of Present Illness:    Heidi Herrera is a 55 y.o. female.  Patient has past medical history of essential hypertension and dyslipidemia.  She denies any problems at this time and takes  care of activities of daily living.  She tells me that she has to urinate frequently since she is on diuretic.  She feels a little weak at times.  No chest pain orthopnea or PND.  She wonders whether I could change her diuretic medication and start to another medication which could help the symptoms.  At the time of my evaluation, the patient is alert awake oriented and in no distress.  Past Medical History:  Diagnosis Date   Acid reflux    Allergy    Anemia    Anxiety    Fibroids    GERD (gastroesophageal reflux disease)    H. pylori infection    Hypertension    Kidney failure    stage 2   Migraine    Obesity    Polycystic kidney disease    Sebaceous cyst    Sessile colonic polyp    Vertigo     Past Surgical History:  Procedure Laterality Date   IR GENERIC HISTORICAL  12/01/2016   IR ANGIOGRAM PELVIS SELECTIVE OR SUPRASELECTIVE 12/01/2016 Sandi Mariscal, MD [facility]   IR GENERIC HISTORICAL  12/01/2016   IR US GUIDE VASC ACCESS RIGHT 12/01/2016 Sandi Mariscal, MD [facility]   IR GENERIC HISTORICAL  12/01/2016   IR EMBO ARTERIAL NOT HEMORR HEMANG INC GUIDE ROADMAPPING 12/01/2016 Sandi Mariscal, MD [facility]   IR GENERIC HISTORICAL  12/01/2016   IR ANGIOGRAM SELECTIVE EACH ADDITIONAL VESSEL 12/01/2016 Sandi Mariscal, MD [facility]   IR  GENERIC HISTORICAL  12/01/2016   IR ANGIOGRAM SELECTIVE EACH ADDITIONAL VESSEL 12/01/2016 Sandi Mariscal, MD [facility]   IR GENERIC HISTORICAL  12/01/2016   IR ANGIOGRAM PELVIS SELECTIVE OR SUPRASELECTIVE 12/01/2016 Sandi Mariscal, MD [facility]   IR GENERIC HISTORICAL  01/07/2017   IR RADIOLOGIST EVAL & MGMT 01/07/2017 Sandi Mariscal, MD [facility]   IR RADIOLOGIST EVAL & MGMT  08/31/2017   LOBECTOMY  2009   MYOMECTOMY  2007   UTERINE FIBROID EMBOLIZATION     12/01/2016    Current Medications: Current Meds  Medication Sig   Biotin 1000 MCG tablet Take 1,000 mcg by mouth daily.    calcium-vitamin D (OSCAL WITH D) 500-200 MG-UNIT per tablet  Take 1 tablet by mouth 2 (two) times daily. Calcium 1000 mg with Magnesium 400 mg and zinc 15 mg, taking 1 capsule 2 times daily   Cyanocobalamin (VITAMIN B 12 PO) Take 1 tablet by mouth daily.   DILT-XR 120 MG 24 hr capsule Take 120 mg by mouth daily.   FERROUS FUMARATE PO Take 15 mLs by mouth daily.   FLUoxetine (PROZAC) 10 MG tablet Take 10 mg by mouth daily.   lisinopril-hydrochlorothiazide (ZESTORETIC) 20-25 MG tablet Take 1 tablet by mouth daily.     Allergies:   Bystolic [nebivolol hcl], Biaxin [clarithromycin], and Erythromycin base   Social History   Socioeconomic History   Marital status: Divorced    Spouse name: Not on file   Number of children: 1   Years of education: Not on file   Highest education level: Master's degree (e.g., MA, MS, MEng, MEd, MSW, MBA)  Occupational History    Comment: [employer]  Tobacco Use   Smoking status: Never   Smokeless tobacco: Never  Vaping Use   Vaping Use: Never used  Substance and Sexual Activity   Alcohol use: Not Currently    Alcohol/week: 1.0 standard drink    Types: 1 Glasses of wine per week    Comment: quit years ago   Drug use: No   Sexual activity: Not on file  Other Topics Concern   Not on file  Social History Narrative   Lives alone   No caffiene   Social Determinants of Health   Financial Resource Strain: Not on file  Food Insecurity: Not on file  Transportation Needs: Not on file  Physical Activity: Not on file  Stress: Not on file  Social Connections: Not on file     Family History: The patient's family history includes Cancer in her brother; Cancer (age of onset: 60) in her sister; Diabetes in her mother, sister, and sister; Esophageal cancer in her maternal grandfather; Glaucoma in her brother, maternal uncle, and sister; Heart disease in her sister; Hyperlipidemia in her sister; Hypertension in her brother, father, mother, sister, and sister; Mental illness in her father, sister, and sister; Obesity in her  brother, mother, and sister; Pancreatic cancer in her cousin; Stroke in her brother, brother, and mother. There is no history of Stomach cancer or Colon cancer.  ROS:   Please see the history of present illness.    All other systems reviewed and are negative.  EKGs/Labs/Other Studies Reviewed:    The following studies were reviewed today:  Study Highlights    The left ventricular ejection fraction is normal (55-65%). Nuclear stress EF: 58%. There was no ST segment deviation noted during stress. No T wave inversion was noted during stress. The study is normal. This is a low risk study.  1.  Normal myocardial perfusion imaging study without evidence of ischemia or infarction. 2.  Normal LVEF, 58%. 3.  Excellent functional capacity (10:31 min:s; 12.5 METS).  4.  Normal heart rate and blood pressure response to exercise. 5.  This is a low risk study.    Vitals  Height Weight BMI (Calculated)  '5\' 5"'$  (1.651 m) 184 lb 1.3 oz (83.5 kg) 30.63   Study Highlights    The left ventricular ejection fraction is normal (55-65%). Nuclear stress EF: 58%. There was no ST segment deviation noted during stress. No T wave inversion was noted during stress. The study is normal. This is a low risk study.   1.  Normal myocardial perfusion imaging study without evidence of ischemia or infarction. 2.  Normal LVEF, 58%. 3.  Excellent functional capacity (10:31 min:s; 12.5 METS).  4.  Normal heart rate and blood pressure response to exercise. 5.  This is a low risk study.   Recent Labs: 05/29/2021: ALT 16 06/04/2021: TSH 2.543 06/26/2021: Hemoglobin 11.6; Platelets 197 07/09/2021: BUN 17; Creatinine, Ser 1.11; Potassium 4.6; Sodium 148  Recent Lipid Panel    Component Value Date/Time   CHOL 164 02/08/2020 1500   TRIG 77 02/08/2020 1500   HDL 49 02/08/2020 1500   CHOLHDL 3.3 02/08/2020 1500   CHOLHDL 4.1 01/05/2014 0823   VLDL 12 01/05/2014 0823   LDLCALC 100 (H) 02/08/2020 1500    LDLDIRECT 148.8 11/26/2008 1223    Physical Exam:    VS:  BP 138/84 (BP Location: Left Arm, Patient Position: Sitting, Cuff Size: Normal)   Pulse 82   Ht '5\' 5"'$  (1.651 m)   Wt 184 lb 1.3 oz (83.5 kg)   SpO2 98%   BMI 30.63 kg/m     Wt Readings from Last 3 Encounters:  07/30/21 184 lb 1.3 oz (83.5 kg)  07/11/21 185 lb (83.9 kg)  07/09/21 185 lb 1.3 oz (84 kg)     GEN: Patient is in no acute distress HEENT: Normal NECK: No JVD; No carotid bruits LYMPHATICS: No lymphadenopathy CARDIAC: Hear sounds regular, 2/6 systolic murmur at the apex. RESPIRATORY:  Clear to auscultation without rales, wheezing or rhonchi  ABDOMEN: Soft, non-tender, non-distended MUSCULOSKELETAL:  No edema; No deformity  SKIN: Warm and dry NEUROLOGIC:  Alert and oriented x 3 PSYCHIATRIC:  Normal affect   Signed, Jenean Lindau, MD  07/30/2021 2:41 PM    [facility]

## 2021-07-30 NOTE — Patient Instructions (Signed)
Medication Instructions:  Your physician has recommended you make the following change in your medication:   Stop Lisinopril/Hydrochlorothiazide Start Lisinopril 40 mg daily.   *If you need a refill on your cardiac medications before your next appointment, please call your pharmacy*   Lab Work: Your physician recommends that you return for lab work in: 1 week You need to have labs done when you are fasting.  You can come Monday through Friday 8:30 am to 12:00 pm and 1:15 to 4:30. You do not need to make an appointment as the order has already been placed. The labs you are going to have done are BMET, LFT and Lipids.  If you have labs (blood work) drawn today and your tests are completely normal, you will receive your results only by: [place] (if you have MyChart) OR A paper copy in the mail If you have any lab test that is abnormal or we need to change your treatment, we will call you to review the results.   Testing/Procedures: None ordered   Follow-Up: At [facility], you and your health needs are our priority.  As part of our continuing mission to provide you with exceptional heart care, we have created designated Provider Care Teams.  These Care Teams include your primary Cardiologist (physician) and Advanced Practice Providers (APPs -  Physician Assistants and Nurse Practitioners) who all work together to provide you with the care you need, when you need it.  We recommend signing up for the patient portal called "MyChart".  Sign up information is provided on this After Visit Summary.  MyChart is used to connect with patients for Virtual Visits (Telemedicine).  Patients are able to view lab/test results, encounter notes, upcoming appointments, etc.  Non-urgent messages can be sent to your provider as well.   To learn more about what you can do with MyChart, go to [email or web address].    Your next appointment:   6 month(s)  The format for your next appointment:    In Person  Provider:   Jyl Heinz, MD   Other Instructions NA

## 2021-08-07 LAB — LIPID PANEL
Chol/HDL Ratio: 4.2 ratio (ref 0.0–4.4)
Cholesterol, Total: 176 mg/dL (ref 100–199)
HDL: 42 mg/dL (ref >39)
LDL Chol Calc (NIH): 122 mg/dL — ABNORMAL HIGH (ref 0–99)
Triglycerides: 63 mg/dL (ref 0–149)
VLDL Cholesterol Cal: 12 mg/dL (ref 5–40)

## 2021-08-07 LAB — HEPATIC FUNCTION PANEL
ALT: 11 IU/L (ref 0–32)
AST: 16 IU/L (ref 0–40)
Albumin: 4.1 g/dL (ref 3.8–4.9)
Alkaline Phosphatase: 53 IU/L (ref 44–121)
Bilirubin Total: 0.4 mg/dL (ref 0.0–1.2)
Bilirubin, Direct: 0.11 mg/dL (ref 0.00–0.40)
Total Protein: 7.3 g/dL (ref 6.0–8.5)

## 2021-08-07 LAB — BASIC METABOLIC PANEL
BUN/Creatinine Ratio: 9 (ref 9–23)
BUN: 11 mg/dL (ref 6–24)
CO2: 21 mmol/L (ref 20–29)
Calcium: 9.2 mg/dL (ref 8.7–10.2)
Chloride: 105 mmol/L (ref 96–106)
Creatinine, Ser: 1.2 mg/dL — ABNORMAL HIGH (ref 0.57–1.00)
Glucose: 77 mg/dL (ref 65–99)
Potassium: 4.8 mmol/L (ref 3.5–5.2)
Sodium: 142 mmol/L (ref 134–144)
eGFR: 53 mL/min/{1.73_m2} — ABNORMAL LOW (ref >59)

## 2021-08-08 MED ORDER — ROSUVASTATIN CALCIUM 10 MG PO TABS: 10.0000 mg | ORAL_TABLET | Freq: Every day | ORAL | 3 refills | Status: DC

## 2021-08-08 NOTE — Addendum Note (Signed)
Addended by: Truddie Hidden on: 08/08/2021 04:17 PM   Modules accepted: Orders

## 2021-08-14 ENCOUNTER — Telehealth: Payer: Self-pay

## 2021-08-14 NOTE — Telephone Encounter (Signed)
Left VM to callback regarding medication.

## 2021-08-15 NOTE — Telephone Encounter (Signed)
Pt returning phone call... please advise  

## 2021-08-25 MED ORDER — VALSARTAN 320 MG PO TABS: 320.0000 mg | ORAL_TABLET | Freq: Every day | ORAL | 3 refills | Status: DC

## 2021-09-02 MED ORDER — LOSARTAN POTASSIUM 100 MG PO TABS: 100.0000 mg | ORAL_TABLET | Freq: Every day | ORAL | 3 refills | Status: DC

## 2021-09-02 NOTE — Telephone Encounter (Signed)
Reviewed information with patient.  Explained that irbesartan and losartan are similar to valsartan and it's possible that she could have GI issues with either.  She knows someone who does well on losartan and would be more comfortable trying that first.  She understands that there is variability with everyone and no guarantee that she will do well because someone else dose.    Will have her try losartan 100 mg qd.    Patient agreeable with plan.

## 2021-09-02 NOTE — Addendum Note (Signed)
Addended by: Rockne Menghini on: 09/02/2021 04:10 PM   Modules accepted: Orders

## 2021-10-15 ENCOUNTER — Telehealth: Payer: Self-pay | Admitting: Cardiology

## 2021-10-15 NOTE — Telephone Encounter (Signed)
Patient would like to switch to Dr. Oval Linsey. Please advise

## 2021-10-23 ENCOUNTER — Ambulatory Visit: Payer: BC Managed Care – PPO | Admitting: Pharmacist Clinician (PhC)/ Clinical Pharmacy Specialist

## 2021-10-23 ENCOUNTER — Other Ambulatory Visit: Payer: Self-pay

## 2021-10-23 VITALS — BP 174/110 | HR 77 | Resp 14 | Ht 65.0 in | Wt 191.6 lb

## 2021-10-23 DIAGNOSIS — I10 Essential (primary) hypertension: Secondary | ICD-10-CM

## 2021-10-23 MED ORDER — LISINOPRIL 5 MG PO TABS: 5.0000 mg | ORAL_TABLET | Freq: Every day | ORAL | 3 refills | Status: DC

## 2021-10-23 NOTE — Progress Notes (Deleted)
10/23/2021 Heidi Herrera Aug 29, 1966 [id number]   HPI:  Heidi Herrera is a 55 y.o. female patient of Dr Nancy Fetter, with a PMH below who presents today for hypertension clinic evaluation. At last visit with MD on 8/17, her blood pressure was 134/84. Patient was complaining about frequent urination, so HCTZ was discontinued and her lisinopril increased from 20 to 40 mg. In addition to frequent urination, patient experienced abdominal swelling and bloating. Patient had no relief of symptoms so lisinopril was discontinued. Patient tried valsartan with still no relief of symptoms. Valsartan was discontinued and losartan 100 mg was initiated.   Today the patient presents for follow-up. Patient stated how she has not been taking the Dilt-XR because it makes her feel swollen and urinate frequently which makes it hard for her to carry out her daily activities.  Past Medical History: HTN BP 134/84 on DILT-XR 120 mg daily, Losartan 100 mg daily  HLD 8/22 LDL 122, on rosuvastatin 10 mg daily  Migraines   CKD 8/22 Scr 1.20, GFR 53  ASCVD 2/15 CT positive for abdominal aortic atherosclerosis     Blood Pressure Goal:  130/80  Current Medications: Losartan 100 mg daily   Family Hx: Mother had two strokes in the past but patient unsure if she passed from cardiac causes, endorses 2 brother and 3 sisters have high blood pressure.  Social Hx: Patient denies any tobacco, alcohol, or illicit drug use  Diet: She says that she recently started a plant-based diet   Exercise: Admits to exercising before the pandemic but since she began experiencing side effects, specifically GI irritation, she has not been well enough to do physical activity.   Home BP readings: Says on average a home readings around 143/98 but does not regularly check blood pressure, says the last time she checked was 1 week ago approximately.   Intolerances: Nebivolol (anaphylaxis), Amlodipine (fatigue, swelling), HCTZ (urination),  Lisinopril (bloating, nausea), Hydralazine (heart palpitations)   Labs:  08/06/21: Glu 77, BUN 11, Scr 1.2, GFR 53, Na 142, K 4.8  Wt Readings from Last 3 Encounters:  10/23/21 191 lb 9.6 oz (86.9 kg)  07/30/21 184 lb 1.3 oz (83.5 kg)  07/11/21 185 lb (83.9 kg)   BP Readings from Last 3 Encounters:  10/23/21 (!) 174/110  07/30/21 138/84  07/09/21 (!) 150/94   Pulse Readings from Last 3 Encounters:  10/23/21 77  07/30/21 82  07/09/21 68    Current Outpatient Medications  Medication Sig Dispense Refill   Bacillus Coagulans-Inulin (ALIGN PREBIOTIC-PROBIOTIC PO) Take by mouth.     calcium-vitamin D (OSCAL WITH D) 500-200 MG-UNIT per tablet Take 1 tablet by mouth 2 (two) times daily. Calcium 1000 mg with Magnesium 400 mg and zinc 15 mg, taking 1 capsule 2 times daily     Cyanocobalamin (VITAMIN B 12 PO) Take 1 tablet by mouth daily.     FERROUS FUMARATE PO Take 15 mLs by mouth daily.     lisinopril (ZESTRIL) 5 MG tablet Take 1 tablet (5 mg total) by mouth daily. 30 tablet 3   methocarbamol (ROBAXIN) 500 MG tablet Take 500 mg by mouth every 8 (eight) hours as needed.     No current facility-administered medications for this visit.    Allergies  Allergen Reactions   Bystolic [Nebivolol Hcl] Anaphylaxis, Itching and Swelling   Biaxin [Clarithromycin]     The category of the Biaxin family.  Also allergic to water chestnuts.   Erythromycin Base Rash  Past Medical History:  Diagnosis Date   Acid reflux    Allergy    Anemia    Anxiety    Fibroids    GERD (gastroesophageal reflux disease)    H. pylori infection    Hypertension    Kidney failure    stage 2   Migraine    Obesity    Polycystic kidney disease    Sebaceous cyst    Sessile colonic polyp    Vertigo     Blood pressure (!) 174/110, pulse 77, resp. rate 14, height 5\' 5"  (1.651 m), weight 191 lb 9.6 oz (86.9 kg), SpO2 99 %.  Essential hypertension  Patient with essential hypertension. Patient is still taking  the losartan but did not take it prior to appointment because it also makes her urinate throughout the day so she prefers to take it around noon to mitigate symptoms. Ultimately, it was decided that there is an overall sensitivity to medications as there is a common theme of GI issues (bloating, nausea) associated with past antihypertensives. Patient does admit to GI issues prior in the past such as IBS and severe gastritis that may be exacerbated with medications. Patient is worried about kidney function and has tried a higher dose of lisinopril recently but stated it caused nausea and bloating, however, she was open to discontinuing current medications and restarting lisinopril at a low dose of 5 mg. Patient asked to take blood pressure at home, record findings, and keep an average. The patient will follow up again in 1 month with pharmacy. Moshe Salisbury PharmD candidate  Had long discussion with patient regarding similar side effects of multiple medications of different mechanism.  Suggested that she may need to work with PCP or GI in regards to bloating/swelling complaints, as these are not common with most blood pressure medications.  Unsure if these symptoms are truly worse with medication.  She did note having switched to plant based diet, suggested that some of her GI symptoms could be related, but encouraged her to ask PCP about getting consultation with nutritionist/dietician.  Because she believes medications are cause of this, as well as frequent urination, it limits [place] in BP treatment options.  Discussed possibility of needing 2-3 medications at lower (hopefully) tolerable doses as opposed to 1 medication at full dose.  Patient willing to try lisinopril at 5 mg, aware that this will have minimal impact on her current BP readings, but hopefully will be able to tolerate and we can slowly titrate up or potentially add amlodipine 2.5 mg.  She was asked to monitor home BP 3-4 times per week, and bring  that information, along with home cuff, to her next appointment.   Tommy Medal PharmD  Dorian Furnace PharmD/MBA Candidate [facility], Class of 2023  I was with patient and student throughout entire visit and agree with above plan  Tommy Medal PharmD CPP [place] [address] [place],  [id number]

## 2021-10-23 NOTE — Patient Instructions (Signed)
Return for a a follow up appointment December 6 at 3:00   Go to the lab in 2 weeks  Check your blood pressure at home daily and keep record of the readings.  Take your BP meds as follows:  Start lisinopril 5 mg once daily   Bring all of your meds, your BP cuff and your record of home blood pressures to your next appointment.  Exercise as you're able, try to walk approximately 30 minutes per day.  Keep salt intake to a minimum, especially watch canned and prepared boxed foods.  Eat more fresh fruits and vegetables and fewer canned items.  Avoid eating in fast food restaurants.    HOW TO TAKE YOUR BLOOD PRESSURE: Rest 5 minutes before taking your blood pressure.  Don't smoke or drink caffeinated beverages for at least 30 minutes before. Take your blood pressure before (not after) you eat. Sit comfortably with your back supported and both feet on the floor (don't cross your legs). Elevate your arm to heart level on a table or a desk. Use the proper sized cuff. It should fit smoothly and snugly around your bare upper arm. There should be enough room to slip a fingertip under the cuff. The bottom edge of the cuff should be 1 inch above the crease of the elbow. Ideally, take 3 measurements at one sitting and record the average.

## 2021-10-23 NOTE — Progress Notes (Signed)
10/23/2021 [name] [birth year] [id number]     HPI:  Heidi Herrera is a 55 y.o. female patient of Dr Nancy Fetter, with a PMH below who presents today for hypertension clinic evaluation. At last visit with MD on 8/17, her blood pressure was 134/84. Patient was complaining about frequent urination, so HCTZ was discontinued and her lisinopril increased from 20 to 40 mg. In addition to frequent urination, patient experienced abdominal swelling and bloating. Patient had no relief of symptoms so lisinopril was discontinued. Patient tried valsartan with still no relief of symptoms. Valsartan was discontinued and losartan 100 mg was initiated.    Today the patient presents for follow-up. Patient stated how she has not been taking the Dilt-XR because it makes her feel swollen and urinate frequently which makes it hard for her to carry out her daily activities.   Past Medical History: HTN BP 134/84 on DILT-XR 120 mg daily, Losartan 100 mg daily  HLD 8/22 LDL 122, on rosuvastatin 10 mg daily  Migraines    CKD 8/22 Scr 1.20, GFR 53  ASCVD 2/15 CT positive for abdominal aortic atherosclerosis                 Blood Pressure Goal:  130/80   Current Medications: Losartan 100 mg daily    Family Hx: Mother had two strokes in the past but patient unsure if she passed from cardiac causes, endorses 2 brother and 3 sisters have high blood pressure.   Social Hx: Patient denies any tobacco, alcohol, or illicit drug use   Diet: She says that she recently started a plant-based diet    Exercise: Admits to exercising before the pandemic but since she began experiencing side effects, specifically GI irritation, she has not been well enough to do physical activity.    Home BP readings: Says on average a home readings around 143/98 but does not regularly check blood pressure, says the last time she checked was 1 week ago approximately.    Intolerances: Nebivolol (anaphylaxis), Amlodipine (fatigue, swelling), HCTZ  (urination), Lisinopril (bloating, nausea), Hydralazine (heart palpitations)     Labs:  08/06/21: Glu 77, BUN 11, Scr 1.2, GFR 53, Na 142, K 4.8      Wt Readings from Last 3 Encounters:  10/23/21 191 lb 9.6 oz (86.9 kg)  07/30/21 184 lb 1.3 oz (83.5 kg)  07/11/21 185 lb (83.9 kg)       BP Readings from Last 3 Encounters:  10/23/21 (!) 174/110  07/30/21 138/84  07/09/21 (!) 150/94       Pulse Readings from Last 3 Encounters:  10/23/21 77  07/30/21 82  07/09/21 68            Current Outpatient Medications  Medication Sig Dispense Refill   Bacillus Coagulans-Inulin (ALIGN PREBIOTIC-PROBIOTIC PO) Take by mouth.       calcium-vitamin D (OSCAL WITH D) 500-200 MG-UNIT per tablet Take 1 tablet by mouth 2 (two) times daily. Calcium 1000 mg with Magnesium 400 mg and zinc 15 mg, taking 1 capsule 2 times daily       Cyanocobalamin (VITAMIN B 12 PO) Take 1 tablet by mouth daily.       FERROUS FUMARATE PO Take 15 mLs by mouth daily.       lisinopril (ZESTRIL) 5 MG tablet Take 1 tablet (5 mg total) by mouth daily. 30 tablet 3   methocarbamol (ROBAXIN) 500 MG tablet Take 500 mg by mouth every 8 (eight) hours as needed.  No current facility-administered medications for this visit.           Allergies  Allergen Reactions   Bystolic [Nebivolol Hcl] Anaphylaxis, Itching and Swelling   Biaxin [Clarithromycin]        The category of the Biaxin family.  Also allergic to water chestnuts.   Erythromycin Base Rash          Past Medical History:  Diagnosis Date   Acid reflux     Allergy     Anemia     Anxiety     Fibroids     GERD (gastroesophageal reflux disease)     H. pylori infection     Hypertension     Kidney failure      stage 2   Migraine     Obesity     Polycystic kidney disease     Sebaceous cyst     Sessile colonic polyp     Vertigo        Blood pressure (!) 174/110, pulse 77, resp. rate 14, height 5\' 5"  (1.651 m), weight 191 lb 9.6 oz (86.9 kg), SpO2 99 %.    Essential hypertension  Patient with essential hypertension. Patient is still taking the losartan but did not take it prior to appointment because it also makes her urinate throughout the day so she prefers to take it around noon to mitigate symptoms. Ultimately, it was decided that there is an overall sensitivity to medications as there is a common theme of GI issues (bloating, nausea) associated with past antihypertensives. Patient does admit to GI issues prior in the past such as IBS and severe gastritis that may be exacerbated with medications. Patient is worried about kidney function and has tried a higher dose of lisinopril recently but stated it caused nausea and bloating, however, she was open to discontinuing current medications and restarting lisinopril at a low dose of 5 mg. Patient asked to take blood pressure at home, record findings, and keep an average. The patient will follow up again in 1 month with pharmacy. Moshe Salisbury PharmD candidate   Had long discussion with patient regarding similar side effects of multiple medications of different mechanism.  Suggested that she may need to work with PCP or GI in regards to bloating/swelling complaints, as these are not common with most blood pressure medications.  Unsure if these symptoms are truly worse with medication.  She did note having switched to plant based diet, suggested that some of her GI symptoms could be related, but encouraged her to ask PCP about getting consultation with nutritionist/dietician.  Because she believes medications are cause of this, as well as frequent urination, it limits [place] in BP treatment options.  Discussed possibility of needing 2-3 medications at lower (hopefully) tolerable doses as opposed to 1 medication at full dose.  Patient willing to try lisinopril at 5 mg, aware that this will have minimal impact on her current BP readings, but hopefully will be able to tolerate and we can slowly titrate up or  potentially add amlodipine 2.5 mg.  She was asked to monitor home BP 3-4 times per week, and bring that information, along with home cuff, to her next appointment.   Tommy Medal PharmD   Dorian Furnace PharmD/MBA Candidate [facility], Class of 2023   I was with patient and student throughout entire visit and agree with above plan   Tommy Medal PharmD CPP [place] [address] [place], [place] [id number]

## 2021-10-23 NOTE — Assessment & Plan Note (Addendum)
Patient with essential hypertension. Patient is still taking the losartan but did not take it prior to appointment because it also makes her urinate throughout the day so she prefers to take it around noon to mitigate symptoms. Ultimately, it was decided that there is an overall sensitivity to medications as there is a common theme of GI issues (bloating, nausea) associated with past antihypertensives. Patient does admit to GI issues prior in the past such as IBS and severe gastritis that may be exacerbated with medications. Patient is worried about kidney function and has tried a higher dose of lisinopril recently but stated it caused nausea and bloating, however, she was open to discontinuing current medications and restarting lisinopril at a low dose of 5 mg. Patient asked to take blood pressure at home, record findings, and keep an average. The patient will follow up again in 1 month with pharmacy. Moshe Salisbury PharmD candidate  Had long discussion with patient regarding similar side effects of multiple medications of different mechanism.  Suggested that she may need to work with PCP or GI in regards to bloating/swelling complaints, as these are not common with most blood pressure medications.  Unsure if these symptoms are truly worse with medication.  She did note having switched to plant based diet, suggested that some of her GI symptoms could be related, but encouraged her to ask PCP about getting consultation with nutritionist/dietician.  Because she believes medications are cause of this, as well as frequent urination, it limits [place] in BP treatment options.  Discussed possibility of needing 2-3 medications at lower (hopefully) tolerable doses as opposed to 1 medication at full dose.  Patient willing to try lisinopril at 5 mg, aware that this will have minimal impact on her current BP readings, but hopefully will be able to tolerate and we can slowly titrate up or potentially add amlodipine 2.5 mg.   She was asked to monitor home BP 3-4 times per week, and bring that information, along with home cuff, to her next appointment.   Tommy Medal PharmD

## 2021-10-27 ENCOUNTER — Ambulatory Visit: Payer: BC Managed Care – PPO | Admitting: Cardiology

## 2021-11-18 ENCOUNTER — Ambulatory Visit: Payer: BC Managed Care – PPO

## 2021-12-09 ENCOUNTER — Ambulatory Visit: Payer: BC Managed Care – PPO

## 2022-03-19 ENCOUNTER — Other Ambulatory Visit: Payer: Self-pay | Admitting: Nurse Practitioner

## 2022-03-19 DIAGNOSIS — Z1231 Encounter for screening mammogram for malignant neoplasm of breast: Secondary | ICD-10-CM

## 2022-03-30 ENCOUNTER — Encounter: Payer: Self-pay | Admitting: Neurology

## 2022-04-08 ENCOUNTER — Ambulatory Visit
Admission: RE | Admit: 2022-04-08 | Discharge: 2022-04-08 | Disposition: A | Discharge status: Home / Self Care | Payer: BC Managed Care – PPO | Source: Ambulatory Visit | Attending: Nurse Practitioner | Admitting: Nurse Practitioner

## 2022-04-08 DIAGNOSIS — Z1231 Encounter for screening mammogram for malignant neoplasm of breast: Secondary | ICD-10-CM

## 2022-04-16 ENCOUNTER — Encounter: Payer: Self-pay | Admitting: Neurology

## 2022-04-16 ENCOUNTER — Ambulatory Visit: Payer: BC Managed Care – PPO | Admitting: Neurology

## 2022-04-16 VITALS — BP 150/94 | HR 57 | Ht 65.0 in | Wt 180.0 lb

## 2022-04-16 DIAGNOSIS — R2990 Unspecified symptoms and signs involving the nervous system: Secondary | ICD-10-CM | POA: Diagnosis not present

## 2022-04-16 NOTE — Patient Instructions (Signed)
Good to meet you. ? ?Schedule open MRI brain with and without contrast ? ?2. Schedule 1-hour EEG, if normal we will plan for a 48-hour EEG ? ?3. Follow-up after tests, call for any changes ? ? ?

## 2022-04-16 NOTE — Progress Notes (Signed)
? ?NEUROLOGY CONSULTATION NOTE ? ?Heidi Herrera ?MRN: [id number] ?DOB: May 01, 1966 ? ?Referring provider: Simona Huh, NP ?Primary care provider: Simona Huh, NP ? ?Reason for consult:  syncope ? ?Thank you for your kind referral of Heidi Herrera for consultation of the above symptoms. Although her history is well known to you, please allow me to reiterate it for the purpose of our medical record. She is alone in the office today. Records and images were personally reviewed where available. ? ? ?HISTORY OF PRESENT ILLNESS: ?This is a 56 year old right-handed woman with a history of hypertension presenting for evaluation of syncope. On further questioning, she denies any episodes of full loss of consciousness. She states she feels like she will pass out with palpitations, but would not pass out. Symptoms started in June 2022, they were occurring at night while in bed, she wakes up "in a stare," goes to use the bathroom and feels like her heart would jump out of her chest. Her whole body felt weak like it was going to give out. The first time it happened she went to [facility] ER, vital signs, CBC, CMP, EKG were unremarkable. A couple of week later, it again happened while she was asleep. She states they either occur when she is asleep or as she wakes up to go to the bathroom. It is almost like the onset of vertigo where she feels a little spinning, this would wake her up. She stares at the ceiling and notices she sees little red blotches that resolve as she gets up to use the bathroom. They are lasting longer, up to 5 minutes. She started using a cold compress which seems to settle symptoms down a little. She has had 4 episodes recently, she had a holter monitor on 3/2 during an episode and was noted to have a HR of 140 bpm. She was started on metoprolol and had another episode, her most recent one, on 03/29/22.  She lives alone and denies being told of any unresponsive episodes. She denies any loss of time. She  states the episodes only occur at night, none in the daytime except when she is stressed and has an onset of weakness coming. She sometimes notices a metallic taste. No focal numbness/tingling/weakness, myoclonic jerks. She notices slight twitching in her fingers sometimes that she attributes to cervical spine issues. Every now and then she has headaches. She gets 6 hours of sleep on average but wakes up every 2 hours. She snores. No daytime drowsiness. She has noticed less palpitations with metoprolol. She works as IT remotely. No alcohol use. Short-term memory is not so good, mood is up and down, she is more anxious about her health than anything. ? ?Epilepsy Risk Factors:  She fell down stairs in 2021, no significant injuries. She had a normal birth and early development.  There is no history of febrile convulsions, CNS infections such as meningitis/encephalitis, significant traumatic brain injury, neurosurgical procedures, or family history of seizures. ? ? ?PAST MEDICAL HISTORY: ?Past Medical History:  ?Diagnosis Date  ? Acid reflux   ? Allergy   ? Anemia   ? Anxiety   ? Fibroids   ? GERD (gastroesophageal reflux disease)   ? H. pylori infection   ? Hypertension   ? 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 Migraine   ? Obesity   ? Polycystic kidney disease   ? Sebaceous cyst   ? Sessile colonic polyp   ? Vertigo   ? ? ?  PAST SURGICAL HISTORY: ?Past Surgical History:  ?Procedure Laterality Date  ? IR GENERIC HISTORICAL  12/01/2016  ? IR ANGIOGRAM PELVIS SELECTIVE OR SUPRASELECTIVE 12/01/2016 Sandi Mariscal, MD [facility]  ? IR GENERIC HISTORICAL  12/01/2016  ? IR US GUIDE VASC ACCESS RIGHT 12/01/2016 Sandi Mariscal, MD [facility]  ? IR GENERIC HISTORICAL  12/01/2016  ? IR EMBO ARTERIAL NOT HEMORR HEMANG INC GUIDE ROADMAPPING 12/01/2016 Sandi Mariscal, MD [facility]  ? IR GENERIC HISTORICAL  12/01/2016  ? IR ANGIOGRAM SELECTIVE EACH ADDITIONAL VESSEL 12/01/2016 Sandi Mariscal, MD [facility]  ? IR GENERIC HISTORICAL  12/01/2016   ? IR ANGIOGRAM SELECTIVE EACH ADDITIONAL VESSEL 12/01/2016 Sandi Mariscal, MD [facility]  ? IR GENERIC HISTORICAL  12/01/2016  ? IR ANGIOGRAM PELVIS SELECTIVE OR SUPRASELECTIVE 12/01/2016 Sandi Mariscal, MD [facility]  ? IR GENERIC HISTORICAL  01/07/2017  ? IR RADIOLOGIST EVAL & MGMT 01/07/2017 Sandi Mariscal, MD [facility]  ? IR RADIOLOGIST EVAL & MGMT  08/31/2017  ? LOBECTOMY  2009  ? MYOMECTOMY  2007  ? UTERINE FIBROID EMBOLIZATION    ? 12/01/2016  ? ? ?MEDICATIONS: ?Current Outpatient Medications on File Prior to Visit  ?Medication Sig Dispense Refill  ? Bacillus Coagulans-Inulin (ALIGN PREBIOTIC-PROBIOTIC PO) Take by mouth.    ? calcium-vitamin D (OSCAL WITH D) 500-200 MG-UNIT per tablet Take 1 tablet by mouth 2 (two) times daily. Calcium 1000 mg with Magnesium 400 mg and zinc 15 mg, taking 1 capsule 2 times daily    ? Cyanocobalamin (VITAMIN B 12 PO) Take 1 tablet by mouth daily.    ? FERROUS FUMARATE PO Take 15 mLs by mouth daily.    ? lisinopril (ZESTRIL) 5 MG tablet Take 1 tablet (5 mg total) by mouth daily. 30 tablet 3  ? methocarbamol (ROBAXIN) 500 MG tablet Take 500 mg by mouth every 8 (eight) hours as needed.    ? metoprolol succinate (TOPROL-XL) 25 MG 24 hr tablet SMARTSIG:1 Tablet(s) By Mouth Every Evening    ? solifenacin (VESICARE) 5 MG tablet solifenacin 5 mg tablet ? TAKE 1 TABLET BY MOUTH ONCE DAILY    ? ?No current facility-administered medications on file prior to visit.  ? ? ?ALLERGIES: ?Allergies  ?Allergen Reactions  ? Bystolic [Nebivolol Hcl] Anaphylaxis, Itching and Swelling  ? Biaxin [Clarithromycin]   ?  The category of the Biaxin family.  Also allergic to water chestnuts.  ? Erythromycin Base Rash  ? ? ?FAMILY HISTORY: ?Family History  ?Problem Relation Age of Onset  ? Hypertension Mother   ? Obesity Mother   ? Stroke Mother   ? Diabetes Mother   ? Hypertension Sister   ? Hyperlipidemia Sister   ? Obesity Sister   ? Diabetes Sister   ? Heart disease Sister   ? Mental illness Sister   ?  Glaucoma Sister   ? Hypertension Brother   ? Obesity Brother   ? Stroke Brother   ? Glaucoma Brother   ? Mental illness Father   ? Hypertension Father   ? Cancer Brother   ? Stroke Brother   ? Hypertension Sister   ? Mental illness Sister   ? Glaucoma Maternal Uncle   ? Cancer Sister 66  ? Diabetes Sister   ? Esophageal cancer Maternal Grandfather   ? Pancreatic cancer Cousin   ? Stomach cancer Neg Hx   ? Colon cancer Neg Hx   ? ? ?SOCIAL HISTORY: ?Social History  ? ?Socioeconomic History  ? Marital status: Divorced  ?  Spouse name: Not on file  ? Number of children: 1  ? Years of education: Not on file  ? Highest education level: Master's degree (e.g., MA, MS, MEng, MEd, MSW, MBA)  ?Occupational History  ?  Comment:  [employer]  ?Tobacco Use  ? Smoking status: Never  ? Smokeless tobacco: Never  ?Vaping Use  ? Vaping Use: Never used  ?Substance and Sexual Activity  ? Alcohol use: Not Currently  ?  Alcohol/week: 1.0 standard drink  ?  Types: 1 Glasses of wine per week  ?  Comment: quit years ago  ? Drug use: No  ? Sexual activity: Not on file  ?Other Topics Concern  ? Not on file  ?Social History Narrative  ? Lives alone  ? No caffiene  ? Right handed  ? Two story and steps inside  ? ?Social Determinants of Health  ? ?Financial Resource Strain: Not on file  ?Food Insecurity: Not on file  ?Transportation Needs: Not on file  ?Physical Activity: Not on file  ?Stress: Not on file  ?Social Connections: Not on file  ?Intimate Partner Violence: Not on file  ? ? ? ?PHYSICAL EXAM: ?Vitals:  ? 04/16/22 1032  ?BP: (!) 186/99  ?Pulse: (!) 57  ?SpO2: 99%  ? ?General: No acute distress ?Head:  Normocephalic/atraumatic ?Skin/Extremities: No rash, no edema ?Neurological Exam: ?Mental status: alert and oriented to person, place, and time, no dysarthria or aphasia, Fund of knowledge is appropriate.  Recent and remote memory are intact, 3/3 delayed recall.  Attention and concentration are normal, 5/5 WORLD backwards.  ?Cranial nerves: ?CN  I: not tested ?CN II: pupils equal, round and reactive to light, visual fields intact ?CN III, IV, VI:  full range of motion, no nystagmus, no ptosis ?CN V: facial sensation intact ?CN VII: upper and low

## 2022-04-22 ENCOUNTER — Ambulatory Visit: Payer: BC Managed Care – PPO | Admitting: Neurology

## 2022-04-22 DIAGNOSIS — R2990 Unspecified symptoms and signs involving the nervous system: Secondary | ICD-10-CM | POA: Diagnosis not present

## 2022-04-22 NOTE — Procedures (Signed)
ELECTROENCEPHALOGRAM REPORT ? ?Date of Study: 04/22/2022 ? ?Patient's Name: Heidi Herrera ?MRN: [id number] ?Date of Birth: June 29, 1966 ? ?Referring Provider: Dr. Ellouise Newer ? ?Clinical History: This is a 56 year old woman with recurrent episodes of near syncope with vision changes, weakness. EEG for classification. ? ?Medications: ?ALIGN PREBIOTIC-PROBIOTIC PO ?OSCAL WITH D 500-200 MG-UNIT per tablet ?Calcium 1000 mg with Magnesium 400 mg and zinc 15 mg/capsule ?VITAMIN B 12 PO ?FERROUS FUMARATE PO ?ZESTRIL 5 MG tablet ?ROBAXIN 500 MG tablet ?TOPROL-XL 25 MG 24 hr tablet ?VESICARE 5 MG tablet ? ?Technical Summary: ?A multichannel digital 1-hour EEG recording measured by the international 10-20 system with electrodes applied with paste and impedances below 5000 ohms performed in our laboratory with EKG monitoring in an awake and asleep patient.  Hyperventilation and photic stimulation were performed.  The digital EEG was referentially recorded, reformatted, and digitally filtered in a variety of bipolar and referential montages for optimal display.   ? ?Description: ?The patient is awake and asleep during the recording.  During maximal wakefulness, there is a symmetric, medium voltage 9-10 Hz posterior dominant rhythm that attenuates with eye opening.  The record is symmetric.  During drowsiness and sleep, there is an increase in theta slowing of the background with shifting asymmetry seen over the bilateral temporal regions.  Vertex waves and symmetric sleep spindles were seen.  Hyperventilation and photic stimulation did not elicit any abnormalities.  There were no epileptiform discharges or electrographic seizures seen.   ? ?EKG lead was unremarkable. ? ?Impression: ?This 1-hour awake and asleep EEG is normal.   ? ?Clinical Correlation: ?A normal EEG does not exclude a clinical diagnosis of epilepsy.  If further clinical questions remain, prolonged EEG may be helpful.  Clinical correlation is advised. ? ? ?Ellouise Newer, M.D. ? ?

## 2022-05-13 ENCOUNTER — Telehealth: Payer: Self-pay | Admitting: *Deleted

## 2022-05-13 ENCOUNTER — Encounter (HOSPITAL_BASED_OUTPATIENT_CLINIC_OR_DEPARTMENT_OTHER): Payer: Self-pay | Admitting: Cardiovascular Disease

## 2022-05-13 ENCOUNTER — Ambulatory Visit (HOSPITAL_BASED_OUTPATIENT_CLINIC_OR_DEPARTMENT_OTHER): Payer: BC Managed Care – PPO | Admitting: Cardiovascular Disease

## 2022-05-13 VITALS — BP 158/94 | HR 69 | Ht 65.0 in | Wt 182.6 lb

## 2022-05-13 DIAGNOSIS — I10 Essential (primary) hypertension: Secondary | ICD-10-CM

## 2022-05-13 DIAGNOSIS — R0683 Snoring: Secondary | ICD-10-CM | POA: Diagnosis not present

## 2022-05-13 DIAGNOSIS — F419 Anxiety disorder, unspecified: Secondary | ICD-10-CM | POA: Diagnosis not present

## 2022-05-13 DIAGNOSIS — R400 Somnolence: Secondary | ICD-10-CM

## 2022-05-13 DIAGNOSIS — R1310 Dysphagia, unspecified: Secondary | ICD-10-CM

## 2022-05-13 DIAGNOSIS — I7090 Unspecified atherosclerosis: Secondary | ICD-10-CM

## 2022-05-13 DIAGNOSIS — R609 Edema, unspecified: Secondary | ICD-10-CM

## 2022-05-13 MED ORDER — CARVEDILOL 12.5 MG PO TABS: 12.5000 mg | ORAL_TABLET | Freq: Two times a day (BID) | ORAL | 3 refills | Status: DC

## 2022-05-13 NOTE — Telephone Encounter (Signed)
Prior Authorization for Split night sleep study sent to [facility] via web portal. Per [employer] web portal no PA is required.

## 2022-05-13 NOTE — Patient Instructions (Addendum)
Medication Instructions:  STOP METOPROLOL   START CARVEDILOL 12.5 MG TWICE A DAY    Labwork: NONE   Testing/Procedures: Your physician has requested that you have an echocardiogram. Echocardiography is a painless test that uses sound waves to create images of your heart. It provides your doctor with information about the size and shape of your heart and how well your heart's chambers and valves are working. This procedure takes approximately one hour. There are no restrictions for this procedure.   Your physician has recommended that you have a sleep study. This test records several body functions during sleep, including: brain activity, eye movement, oxygen and carbon dioxide blood levels, heart rate and rhythm, breathing rate and rhythm, the flow of air through your mouth and nose, snoring, body muscle movements, and chest and belly movement.  Follow-Up: 07/17/2022 AT 1:00 PM WITH PHARM D  09/21/2022 AT 1:45 WITH DR [facility]   Referrals:  YOU HAVE BEEN REFERRED TO DR Brenham [facility]. THE OFFICES WILL CONTACT YOUR DIRECTLY. IF YOU DO NOT HEAR IN 2 WEEKS YOU CAN CALL THEM AT THE NUMBERS HIGHLIGHTED    Special Instructions:   MONITOR AND LOG YOUR BLOOD PRESSURE TWICE A DAY WITH MACHINE PROVIDED. THE OFFICE WILL CALL YOU WHEN THE APP IS WORKING, UNTIL THEN MONITOR IN BOOK PROVIDED   DASH Eating Plan DASH stands for "Dietary Approaches to Stop Hypertension." The DASH eating plan is a healthy eating plan that has been shown to reduce high blood pressure (hypertension). It may also reduce your risk for type 2 diabetes, heart disease, and stroke. The DASH eating plan may also help with weight loss. What are tips for following this plan?  General guidelines Avoid eating more than 2,300 mg (milligrams) of salt (sodium) a day. If you have hypertension, you may need to reduce your sodium intake to 1,500 mg a day. Limit alcohol intake to no more than 1 drink a day for  nonpregnant women and 2 drinks a day for men. One drink equals 12 oz of beer, 5 oz of wine, or 1 oz of hard liquor. Work with your health care provider to maintain a healthy body weight or to lose weight. Ask what an ideal weight is for you. Get at least 30 minutes of exercise that causes your heart to beat faster (aerobic exercise) most days of the week. Activities may include walking, swimming, or biking. Work with your health care provider or diet and nutrition specialist (dietitian) to adjust your eating plan to your individual calorie needs. Reading food labels  Check food labels for the amount of sodium per serving. Choose foods with less than 5 percent of the Daily Value of sodium. Generally, foods with less than 300 mg of sodium per serving fit into this eating plan. To find whole grains, look for the word "whole" as the first word in the ingredient list. Shopping Buy products labeled as "low-sodium" or "no salt added." Buy fresh foods. Avoid canned foods and premade or frozen meals. Cooking Avoid adding salt when cooking. Use salt-free seasonings or herbs instead of table salt or sea salt. Check with your health care provider or pharmacist before using salt substitutes. Do not fry foods. Cook foods using healthy methods such as baking, boiling, grilling, and broiling instead. Cook with heart-healthy oils, such as olive, canola, soybean, or sunflower oil. Meal planning Eat a balanced diet that includes: 5 or more servings of fruits and vegetables each day. At each meal, try to fill  half of your plate with fruits and vegetables. Up to 6-8 servings of whole grains each day. Less than 6 oz of lean meat, poultry, or fish each day. A 3-oz serving of meat is about the same size as a deck of cards. One egg equals 1 oz. 2 servings of low-fat dairy each day. A serving of nuts, seeds, or beans 5 times each week. Heart-healthy fats. Healthy fats called Omega-3 fatty acids are found in foods such  as flaxseeds and coldwater fish, like sardines, salmon, and mackerel. Limit how much you eat of the following: Canned or prepackaged foods. Food that is high in trans fat, such as fried foods. Food that is high in saturated fat, such as fatty meat. Sweets, desserts, sugary drinks, and other foods with added sugar. Full-fat dairy products. Do not salt foods before eating. Try to eat at least 2 vegetarian meals each week. Eat more home-cooked food and less restaurant, buffet, and fast food. When eating at a restaurant, ask that your food be prepared with less salt or no salt, if possible. What foods are recommended? The items listed may not be a complete list. Talk with your dietitian about what dietary choices are best for you. Grains Whole-grain or whole-wheat bread. Whole-grain or whole-wheat pasta. Brown rice. [place] Morrow. Bulgur. Whole-grain and low-sodium cereals. Pita bread. Low-fat, low-sodium crackers. Whole-wheat flour tortillas. Vegetables Fresh or frozen vegetables (raw, steamed, roasted, or grilled). Low-sodium or reduced-sodium tomato and vegetable juice. Low-sodium or reduced-sodium tomato sauce and tomato paste. Low-sodium or reduced-sodium canned vegetables. Fruits All fresh, dried, or frozen fruit. Canned fruit in natural juice (without added sugar). Meat and other protein foods Skinless chicken or [place]. Ground chicken or [place]. Pork with fat trimmed off. Fish and seafood. Egg whites. Dried beans, peas, or lentils. Unsalted nuts, nut butters, and seeds. Unsalted canned beans. Lean cuts of beef with fat trimmed off. Low-sodium, lean deli meat. Dairy Low-fat (1%) or fat-free (skim) milk. Fat-free, low-fat, or reduced-fat cheeses. Nonfat, low-sodium ricotta or cottage cheese. Low-fat or nonfat yogurt. Low-fat, low-sodium cheese. Fats and oils Soft margarine without trans fats. Vegetable oil. Low-fat, reduced-fat, or light mayonnaise and salad dressings (reduced-sodium).  Canola, safflower, olive, soybean, and sunflower oils. Avocado. Seasoning and other foods Herbs. Spices. Seasoning mixes without salt. Unsalted popcorn and pretzels. Fat-free sweets. What foods are not recommended? The items listed may not be a complete list. Talk with your dietitian about what dietary choices are best for you. Grains Baked goods made with fat, such as croissants, muffins, or some breads. Dry pasta or rice meal packs. Vegetables Creamed or fried vegetables. Vegetables in a cheese sauce. Regular canned vegetables (not low-sodium or reduced-sodium). Regular canned tomato sauce and paste (not low-sodium or reduced-sodium). Regular tomato and vegetable juice (not low-sodium or reduced-sodium). Angie Fava. Olives. Fruits Canned fruit in a light or heavy syrup. Fried fruit. Fruit in cream or butter sauce. Meat and other protein foods Fatty cuts of meat. Ribs. Fried meat. Berniece Salines. Sausage. Bologna and other processed lunch meats. Salami. Fatback. Hotdogs. Bratwurst. Salted nuts and seeds. Canned beans with added salt. Canned or smoked fish. Whole eggs or egg yolks. Chicken or [place] with skin. Dairy Whole or 2% milk, cream, and half-and-half. Whole or full-fat cream cheese. Whole-fat or sweetened yogurt. Full-fat cheese. Nondairy creamers. Whipped toppings. Processed cheese and cheese spreads. Fats and oils Butter. Stick margarine. Lard. Shortening. Ghee. Bacon fat. Tropical oils, such as coconut, palm kernel, or palm oil. Seasoning and other foods Salted popcorn  and pretzels. Onion salt, garlic salt, seasoned salt, table salt, and sea salt. Worcestershire sauce. Tartar sauce. Barbecue sauce. Teriyaki sauce. Soy sauce, including reduced-sodium. Steak sauce. Canned and packaged gravies. Fish sauce. Oyster sauce. Cocktail sauce. Horseradish that you find on the shelf. Ketchup. Mustard. Meat flavorings and tenderizers. Bouillon cubes. Hot sauce and Tabasco sauce. Premade or packaged marinades.  Premade or packaged taco seasonings. Relishes. Regular salad dressings. Where to find more information: National Heart, Lung, and [place]: https://wilson-eaton.com/ American Heart Association: www.heart.org Summary The DASH eating plan is a healthy eating plan that has been shown to reduce high blood pressure (hypertension). It may also reduce your risk for type 2 diabetes, heart disease, and stroke. With the DASH eating plan, you should limit salt (sodium) intake to 2,300 mg a day. If you have hypertension, you may need to reduce your sodium intake to 1,500 mg a day. When on the DASH eating plan, aim to eat more fresh fruits and vegetables, whole grains, lean proteins, low-fat dairy, and heart-healthy fats. Work with your health care provider or diet and nutrition specialist (dietitian) to adjust your eating plan to your individual calorie needs. This information is not intended to replace advice given to you by your health care provider. Make sure you discuss any questions you have with your health care provider. Document Released: 11/19/2011 Document Revised: 11/12/2017 Document Reviewed: 11/23/2016 Elsevier Patient Education  2020 [employer].

## 2022-05-13 NOTE — Progress Notes (Unsigned)
Cardiology Office Note   Date:  05/14/2022   ID:  Heidi Herrera, DOB 03-31-1966, MRN [id number]  PCP:  Simona Huh, NP  Cardiologist:   Skeet Latch, MD   No chief complaint on file.     History of Present Illness: Heidi Herrera is a 56 y.o. female with hypertension, aortic atherosclerosis, non-obstructive CAD, CKD 3, and hyperlipidemia here to establish care.  She last saw Dr. Lennox Pippins 07/2021 and complained of frequent urination.  Lisinopril was increased while hydrochlorothiazide was reduced.  At that visit her blood pressure was 138/84.  She previously had a nuclear stress test 06/2021 that revealed LVEF 58% with no ischemia.  She saw her PCP 01/2022 and was no longer on HCTZ.  She had also self discontinued lisinopril and her blood pressure was 172/108.  It was recommended that she resume the lisinopril.  She followed up with the urologist and they started her on Vesicare for urinary frequency.  However she hasn't started taking this yet.    She saw a cardiologist at [facility] 01/2022 and reported palpitations.  She wore a 30-day event recorder which showed sinus tachycardia but no other significant arrhythmias.  Her BP was 168/98 and 154/84.  At the time she was only taking lisinopril.  She reported palpitations and had an event recorder that revealed sinus tachycardia in the middle of the night up to 140 bpm.  She was started on metoprolol succinate. She still has episodes of palpitations though less fequent and less severe.  She notes that she snores and does not feel rested in the AM.  Lately she has feel feeling faint.  She thinks that this is due to her BP has been high. The symptoms are worse later in the day and not orthostatic.  Lately her BP at home has been mostly in the 140s/90s.  It ranges from 130s-150s/80-90s.  She reports lower extremity edema but no orthopnea or PND.  She is also noted swelling in her abdomen.  She has struggled with anxiety in dealing with her  uncontrolled hypertension.  She reports having a great diet and is following a plant-based diet.  Despite that she has discomfort after eating.  She denies dysphagia or odynophagia.  She wonders if certain foods are triggering this.   Past Medical History:  Diagnosis Date   Acid reflux    Allergy    Anemia    Anxiety    Fibroids    GERD (gastroesophageal reflux disease)    H. pylori infection    Hypertension    Kidney failure    stage 2   Migraine    Obesity    Polycystic kidney disease    Sebaceous cyst    Sessile colonic polyp    Vertigo     Past Surgical History:  Procedure Laterality Date   IR GENERIC HISTORICAL  12/01/2016   IR ANGIOGRAM PELVIS SELECTIVE OR SUPRASELECTIVE 12/01/2016 Sandi Mariscal, MD [facility]   IR GENERIC HISTORICAL  12/01/2016   IR US GUIDE VASC ACCESS RIGHT 12/01/2016 Sandi Mariscal, MD [facility]   IR GENERIC HISTORICAL  12/01/2016   IR EMBO ARTERIAL NOT HEMORR HEMANG INC GUIDE ROADMAPPING 12/01/2016 Sandi Mariscal, MD [facility]   IR GENERIC HISTORICAL  12/01/2016   IR ANGIOGRAM SELECTIVE EACH ADDITIONAL VESSEL 12/01/2016 Sandi Mariscal, MD [facility]   IR GENERIC HISTORICAL  12/01/2016   IR ANGIOGRAM SELECTIVE EACH ADDITIONAL VESSEL 12/01/2016 Sandi Mariscal, MD [facility]   IR GENERIC HISTORICAL  12/01/2016   IR ANGIOGRAM PELVIS SELECTIVE OR SUPRASELECTIVE 12/01/2016 Sandi Mariscal, MD [facility]   IR GENERIC HISTORICAL  01/07/2017   IR RADIOLOGIST EVAL & MGMT 01/07/2017 Sandi Mariscal, MD [facility]   IR RADIOLOGIST EVAL & MGMT  08/31/2017   LOBECTOMY  2009   MYOMECTOMY  2007   UTERINE FIBROID EMBOLIZATION     12/01/2016     Current Outpatient Medications  Medication Sig Dispense Refill   Bacillus Coagulans-Inulin (ALIGN PREBIOTIC-PROBIOTIC PO) Take by mouth.     calcium-vitamin D (OSCAL WITH D) 500-200 MG-UNIT per tablet Take 1 tablet by mouth 2 (two) times daily. Calcium 1000 mg with Magnesium 400 mg and zinc 15 mg, taking 1 capsule 2  times daily     carvedilol (COREG) 12.5 MG tablet Take 1 tablet (12.5 mg total) by mouth 2 (two) times daily. 180 tablet 3   Cyanocobalamin (VITAMIN B 12 PO) Take 1 tablet by mouth daily.     FERROUS FUMARATE PO Take 15 mLs by mouth daily.     lisinopril (ZESTRIL) 40 MG tablet Take 40 mg by mouth daily.     methocarbamol (ROBAXIN) 500 MG tablet Take 500 mg by mouth every 8 (eight) hours as needed.     solifenacin (VESICARE) 5 MG tablet solifenacin 5 mg tablet  TAKE 1 TABLET BY MOUTH ONCE DAILY     No current facility-administered medications for this visit.    Allergies:   Bystolic [nebivolol hcl], Amlodipine, Biaxin [clarithromycin], and Erythromycin base    Social History:  The patient  reports that she has never smoked. She has never used smokeless tobacco. She reports that she does not currently use alcohol after a past usage of about 1.0 standard drink per week. She reports that she does not use drugs.   Family History:  The patient's family history includes Cancer in her brother; Cancer (age of onset: 57) in her sister; Diabetes in her mother, sister, and sister; Esophageal cancer in her maternal grandfather; Glaucoma in her brother, maternal uncle, and sister; Heart attack in her sister; Heart disease in her maternal uncle; Hyperlipidemia in her sister; Hypertension in her brother, father, mother, sister, and sister; Mental illness in her father, sister, and sister; Obesity in her brother, mother, and sister; Pancreatic cancer in her cousin; Stroke in her brother, brother, and mother.    ROS:  Please see the history of present illness.   Otherwise, review of systems are positive for none.   All other systems are reviewed and negative.    PHYSICAL EXAM: VS:  BP (!) 158/94   Pulse 69   Ht '5\' 5"'$  (1.651 m)   Wt 182 lb 9.6 oz (82.8 kg)   BMI 30.39 kg/m  , BMI Body mass index is 30.39 kg/m. GENERAL:  Well appearing HEENT:  Pupils equal round and reactive, fundi not visualized, oral  mucosa unremarkable NECK:  No jugular venous distention, waveform within normal limits, carotid upstroke brisk and symmetric, no bruits, no thyromegaly LUNGS:  Clear to auscultation bilaterally HEART:  RRR.  PMI not displaced or sustained,S1 and S2 within normal limits, no S3, no S4, no clicks, no rubs, no murmurs ABD:  Flat, positive bowel sounds normal in frequency in pitch, no bruits, no rebound, no guarding, no midline pulsatile mass, no hepatomegaly, no splenomegaly EXT:  2 plus pulses throughout, no edema, no cyanosis no clubbing SKIN:  No rashes no nodules NEURO:  Cranial nerves II through XII grossly intact, motor grossly intact throughout  PSYCH:  Cognitively intact, oriented to person place and time  EKG:  EKG is ordered today. The ekg ordered today demonstrates sinus rhythm.  Rate 69   03/11/2022  1.  Normal sinus rhythm predominates recording 2.  PACs and PAC burst are noted.  None of these are sustained 3.  Sinus tachycardia is noted during daytime activity, sinus bradycardia is noted during evening hours 4.  No significant arrhythmias are noted otherwise.  Echo 05/2021:  LVEF 60-65%  Exercise Myoview 04/2021: The left ventricular ejection fraction is normal (55-65%). Nuclear stress EF: 58%. There was no ST segment deviation noted during stress. No T wave inversion was noted during stress. The study is normal. This is a low risk study.   1.  Normal myocardial perfusion imaging study without evidence of ischemia or infarction. 2.  Normal LVEF, 58%. 3.  Excellent functional capacity (10:31 min:s; 12.5 METS).  4.  Normal heart rate and blood pressure response to exercise. 5.  This is a low risk study.  Recent Labs: 06/04/2021: TSH 2.543 06/26/2021: Hemoglobin 11.6; Platelets 197 08/06/2021: ALT 11; BUN 11; Creatinine, Ser 1.20; Potassium 4.8; Sodium 142    Lipid Panel    Component Value Date/Time   CHOL 176 08/06/2021 0853   TRIG 63 08/06/2021 0853   HDL 42  08/06/2021 0853   CHOLHDL 4.2 08/06/2021 0853   CHOLHDL 4.1 01/05/2014 0823   VLDL 12 01/05/2014 0823   LDLCALC 122 (H) 08/06/2021 0853   LDLDIRECT 148.8 11/26/2008 1223      Wt Readings from Last 3 Encounters:  05/13/22 182 lb 9.6 oz (82.8 kg)  04/16/22 180 lb (81.6 kg)  10/23/21 191 lb 9.6 oz (86.9 kg)      ASSESSMENT AND PLAN:  Essential hypertension Blood pressure is poorly controlled.  We will start with switching metoprolol to carvedilol.  She hasn't tolerated amlodipine or diuretics.  She understands that we will likely need to titrate the dose or add an additional medication.  We discussed the importance of increasing her exercise to at least 150 minutes weekly.  She will start riding her stationary bike.  She consents to enrolling in our remote patient monitoring system and using [employer].  We will refer her for a sleep study given her snoring and daytime somnolence.  Given that she is tolerating the lisinopril we will continue it for now.  Atherosclerotic vascular disease Lipids are poorly controlled.  Based on this her LDL should be less than 70.  Given that we are starting a new medication and she has had intolerances to medicines we will hold off on starting a statin for now.  We will discuss in the future.  Continue following a plant-based diet and increase exercise as above.  Anxiety She struggles with feeling anxious and feeling like she is not doing anything to help her health condition.  She also expresses a sense of impending doom.  We discussed the fact that I do not see anything that raises concern for immediate harm.  We need to control her blood pressure and lipids to prevent issues down the line.  We will have her see Dr. Michail Sermon to discuss her health mindset and assist with anxiety.  Current medicines are reviewed at length with the patient today.  The patient does not have concerns regarding medicines.  The following changes have been made:   Switch metoprolol  to carvedilol  Labs/ tests ordered today include:   Orders Placed This Encounter  Procedures   Ambulatory referral  to Psychology   Ambulatory referral to Gastroenterology   EKG 12-Lead   ECHOCARDIOGRAM COMPLETE   Split night study     Disposition:   FU with Heidi Landgrebe C. Oval Linsey, MD, [facility] in 4 months.  Pharm.D./APP in 2 months.   Time spent: 50 minutes-Greater than 50% of this time was spent in counseling, explanation of diagnosis, planning of further management, and coordination of care.    Signed, Yeila Morro C. Oval Linsey, MD, [facility]  05/14/2022 10:54 AM    [place]

## 2022-05-14 ENCOUNTER — Encounter (HOSPITAL_BASED_OUTPATIENT_CLINIC_OR_DEPARTMENT_OTHER): Payer: Self-pay

## 2022-05-14 ENCOUNTER — Encounter (HOSPITAL_BASED_OUTPATIENT_CLINIC_OR_DEPARTMENT_OTHER): Payer: Self-pay | Admitting: Cardiovascular Disease

## 2022-05-14 DIAGNOSIS — I10 Essential (primary) hypertension: Secondary | ICD-10-CM

## 2022-05-14 NOTE — Assessment & Plan Note (Signed)
Lipids are poorly controlled.  Based on this her LDL should be less than 70.  Given that we are starting a new medication and she has had intolerances to medicines we will hold off on starting a statin for now.  We will discuss in the future.  Continue following a plant-based diet and increase exercise as above.

## 2022-05-14 NOTE — Assessment & Plan Note (Addendum)
Blood pressure is poorly controlled.  We will start with switching metoprolol to carvedilol.  She hasn't tolerated amlodipine or diuretics.  She understands that we will likely need to titrate the dose or add an additional medication.  We discussed the importance of increasing her exercise to at least 150 minutes weekly.  She will start riding her stationary bike.  She consents to enrolling in our remote patient monitoring system and using [employer].  We will refer her for a sleep study given her snoring and daytime somnolence.  Given that she is tolerating the lisinopril we will continue it for now.

## 2022-05-14 NOTE — Assessment & Plan Note (Signed)
She struggles with feeling anxious and feeling like she is not doing anything to help her health condition.  She also expresses a sense of impending doom.  We discussed the fact that I do not see anything that raises concern for immediate harm.  We need to control her blood pressure and lipids to prevent issues down the line.  We will have her see Dr. Michail Sermon to discuss her health mindset and assist with anxiety.

## 2022-05-14 NOTE — Addendum Note (Signed)
Addended by: Skeet Latch C on: 05/14/2022 12:17 PM   Modules accepted: Orders

## 2022-05-19 ENCOUNTER — Telehealth (HOSPITAL_BASED_OUTPATIENT_CLINIC_OR_DEPARTMENT_OTHER): Payer: Self-pay | Admitting: *Deleted

## 2022-05-19 NOTE — Telephone Encounter (Signed)
Pt stated she started having blurred vision since being switched to new medication per [facility] with research   Blood pressures below  6/1  6/2  6/3  6/4  6/5  6/6 133/85  137/89  139/89  124/82  135/90  137/90 132/86  152/88  138/88  134/93  144/92 128/84    138/88 138/92    137/85     137/99  Spoke with patient regarding blurred vision  Started Carvedilol Was happening prior to starting just gets worse after taking  First thing in am better before takes medications  Takes Lisinopril around 9 am Noon Carvedilol and then again around 6 or 7 pm  Is current on eye exams   Advised to start taking the Carvedilol around 9 am and 9 pm and Lisinopril around noon   Will forward to Dr Berdine Addison D for further review

## 2022-05-25 ENCOUNTER — Telehealth: Payer: Self-pay

## 2022-05-25 DIAGNOSIS — Z0000 Encounter for general adult medical examination without abnormal findings: Secondary | ICD-10-CM

## 2022-05-25 NOTE — Telephone Encounter (Signed)
Called patient for Heidi Herrera follow-up call to ensure she does not have any questions/concerns, prompt times are appropriate, provide [facility] after hours contact information if she has an issue after 5pm, and to offer health coaching per her survey responses regarding not getting the recommended amount of exercise, not having an eating plan to follow, along with sleep concerns. Patient did not answer left message for patient to return call at earliest convenience.    Jerrilynn Mikowski Truman Hayward, [facility] Guide, Health Coach [address] [place] [address] Telephone: [phone] Email: [email or web address]

## 2022-05-26 ENCOUNTER — Ambulatory Visit (INDEPENDENT_AMBULATORY_CARE_PROVIDER_SITE_OTHER): Payer: BC Managed Care – PPO

## 2022-05-26 DIAGNOSIS — R600 Localized edema: Secondary | ICD-10-CM | POA: Diagnosis not present

## 2022-05-26 DIAGNOSIS — R609 Edema, unspecified: Secondary | ICD-10-CM

## 2022-05-26 LAB — ECHOCARDIOGRAM COMPLETE
AV Vena cont: 0.19 cm
Area-P 1/2: 2.39 cm2
S' Lateral: 2.51 cm

## 2022-05-27 ENCOUNTER — Telehealth (HOSPITAL_BASED_OUTPATIENT_CLINIC_OR_DEPARTMENT_OTHER): Payer: Self-pay | Admitting: *Deleted

## 2022-05-27 DIAGNOSIS — Z5181 Encounter for therapeutic drug level monitoring: Secondary | ICD-10-CM

## 2022-05-27 DIAGNOSIS — I10 Essential (primary) hypertension: Secondary | ICD-10-CM

## 2022-05-27 NOTE — Telephone Encounter (Signed)
Good morning Heidi Herrera! Hope all is well today. This patient indicated that she's noticed leg swelling since taking Carvedilol. She mentioned that she wakes up in the middle of the night feeling like she forgot something. She would like to get some advice.   Above message received from [place] guide  Reached out to patient regarding swelling  States swelling does go down some at night When going up steps breathing seems to be different than before Avoids salt/sodium Blood pressure this am 148/82, doing much better   Does think the waking up and feeling like she had forgotten something when she took another medication, she thinks Amlodipine  Will forward to Dr Oval Linsey and Sherian Rein D for review

## 2022-05-27 NOTE — Telephone Encounter (Signed)
May 20, 2022 Heidi Latch, MD to [facility]      05/20/22 11:35 AM Agree with those recommendations.  BP is looking better.  Not a common side effect and since it started before the carvedilol I'm not sure this is the cause.  Keep monitoring for now to see if it improves with time.   TCR

## 2022-05-28 ENCOUNTER — Ambulatory Visit (HOSPITAL_BASED_OUTPATIENT_CLINIC_OR_DEPARTMENT_OTHER): Payer: BC Managed Care – PPO | Admitting: Cardiovascular Disease

## 2022-05-28 NOTE — Telephone Encounter (Signed)
Have her stop carvedilol.  She can try hydralazine 25 mg twice daily.

## 2022-05-28 NOTE — Telephone Encounter (Signed)
Spoke with patient and discussed  She was unsure if she had taken in the past After searching chart she has had Hydralazine and it caused palpitations.   Added to allergies and will forward to Dr Ron Parker D for review

## 2022-05-29 MED ORDER — EPLERENONE 50 MG PO TABS: 50.0000 mg | ORAL_TABLET | Freq: Every day | ORAL | 5 refills | Status: DC

## 2022-05-29 NOTE — Telephone Encounter (Signed)
Reviewed recommendations with patient   1) She is concerned about taking the Eplerenone secondary to her kidney issues, she wants to discuss with Nephrologist first  2) She is concerned about stopping the Carvedilol secondary to her palpitations. Stated they have been much better since starting the Metoprolol which was changed to Carvedilol   Will forward to Dr Oval Linsey for review

## 2022-05-29 NOTE — Telephone Encounter (Signed)
Let's try eplerenone 50 mg daily and repeat BMET in 2 weeks.

## 2022-06-02 MED ORDER — EPLERENONE 50 MG PO TABS: 50.0000 mg | ORAL_TABLET | Freq: Every day | ORAL | 5 refills | Status: DC

## 2022-06-02 NOTE — Telephone Encounter (Signed)
  May 29, 2022 Skeet Latch, MD to [facility]      05/29/22  6:17 PM Her creatinine is 1.2.  There is no reason she cannot take eplerenone.  She is welcome to call her nephrologist and get back to [place] on this.  Patient wants to go back to metoprolol from carvedilol and that is fine, but it will not help her blood pressure.  She can take 50 mg twice daily metoprolol and let us know when she is ready to try the eplerenone.   TCR    Spoke with patient and she stated one time her HR was in the 40's and usually ran in 50's when taking Toprol 25 mg once a day.   She is willing to start the Eplerenone 50 mg daily in the morning, Toprol 25 mg in the evening and continue the Lisinopril 40 mg at noon. Will stop Carvedilol   Will call back early next week with updated blood pressure readings

## 2022-06-03 NOTE — Telephone Encounter (Signed)
Skeet Latch, MD to Me  Rockne Menghini, RPH-CPP      06/03/22  7:30 AM OK thanks.   TCR

## 2022-06-05 ENCOUNTER — Telehealth: Payer: Self-pay

## 2022-06-05 DIAGNOSIS — I10 Essential (primary) hypertension: Secondary | ICD-10-CM

## 2022-06-05 DIAGNOSIS — Z0000 Encounter for general adult medical examination without abnormal findings: Secondary | ICD-10-CM

## 2022-06-12 ENCOUNTER — Telehealth: Payer: Self-pay

## 2022-06-12 ENCOUNTER — Ambulatory Visit (INDEPENDENT_AMBULATORY_CARE_PROVIDER_SITE_OTHER): Payer: BC Managed Care – PPO | Admitting: Psychologist

## 2022-06-12 DIAGNOSIS — F411 Generalized anxiety disorder: Secondary | ICD-10-CM

## 2022-06-12 DIAGNOSIS — F330 Major depressive disorder, recurrent, mild: Secondary | ICD-10-CM

## 2022-06-12 NOTE — Progress Notes (Signed)
                Deltha Bernales, PsyD 
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
## 2022-06-12 NOTE — Plan of Care (Signed)

## 2022-06-12 NOTE — Telephone Encounter (Signed)
Called to discuss PREP program referral; she wants to attend at [facility]; next class starts July 17, every M/W 2-3:15p; will contact in July to set up assessment visit.

## 2022-06-17 ENCOUNTER — Telehealth: Payer: Self-pay

## 2022-06-17 NOTE — Telephone Encounter (Signed)
Called to confirm participation in next PREP class at [facility] on July 17, left voicemail.

## 2022-06-18 ENCOUNTER — Ambulatory Visit (INDEPENDENT_AMBULATORY_CARE_PROVIDER_SITE_OTHER): Payer: BC Managed Care – PPO | Admitting: Psychologist

## 2022-06-18 ENCOUNTER — Ambulatory Visit (HOSPITAL_BASED_OUTPATIENT_CLINIC_OR_DEPARTMENT_OTHER): Payer: BC Managed Care – PPO | Attending: Cardiovascular Disease | Admitting: Cardiology

## 2022-06-18 ENCOUNTER — Telehealth: Payer: Self-pay

## 2022-06-18 DIAGNOSIS — R400 Somnolence: Secondary | ICD-10-CM | POA: Diagnosis not present

## 2022-06-18 DIAGNOSIS — R0683 Snoring: Secondary | ICD-10-CM

## 2022-06-18 DIAGNOSIS — F411 Generalized anxiety disorder: Secondary | ICD-10-CM

## 2022-06-18 DIAGNOSIS — F330 Major depressive disorder, recurrent, mild: Secondary | ICD-10-CM | POA: Diagnosis not present

## 2022-06-18 NOTE — Progress Notes (Signed)
                Kierra Jezewski, PsyD 

## 2022-06-18 NOTE — Progress Notes (Signed)
[place] Counselor/Therapist Progress Note  Patient ID: Heidi Herrera, MRN: [id number],    Date: 06/18/2022  Time Spent: 02:02 pm to 02:46 pm; total time: 44 minutes   This session was held via video webex teletherapy due to the coronavirus risk at this time. The patient consented to video teletherapy and was located at her home during this session. She is aware it is the responsibility of the patient to secure confidentiality on her end of the session. The provider was in a private home office for the duration of this session. Limits of confidentiality were discussed with the patient.   Treatment Type: Individual Therapy  Reported Symptoms: Depression and low self-worth  Mental Status Exam: Appearance:  Well Groomed     Behavior: Appropriate  Motor: Normal  Speech/Language:  Clear and Coherent  Affect: Appropriate  Mood: normal  Thought process: normal  Thought content:   WNL  Sensory/Perceptual disturbances:   WNL  Orientation: oriented to person, place, and time/date  Attention: Good  Concentration: Good  Memory: WNL  Fund of knowledge:  Good  Insight:   Fair  Judgment:  Good  Impulse Control: Good   Risk Assessment: Danger to Self:  No Self-injurious Behavior: No Danger to Others: No Duty to Warn:no Physical Aggression / Violence:No  Access to Firearms a concern: No  Gang Involvement:No   Subjective: Beginning the session, patient described herself as doing okay, while reflecting on events since the intake. After reviewing the treatment plan, patient stated that she wanted to work on establishing boundaries in her life. As part of this theme, she explored the reasons as to why she wants to establish boundaries, and the etiology behind challenges of establishing boundaries. The theme of self-worth was identified and explored where patient experiences self-worth. Processed the idea that part of the challenge in establishing boundaries is the idea of  looking at receiving worth from others. Process thoughts and emotions expressed. She denied suicidal and homicidal ideation.   Interventions:  Worked on developing a therapeutic relationship with the patient using active listening and reflective statements. Provided emotional support using empathy and validation. Reviewed the treatment plan with the patient. Reviewed events since the intake. Normalized and validated expressed thoughts. Identified goals for the session. Began to explore the etiology of difficulty with boundaries. Used socratic questions to assist the patient gain insight into self. Explored the goals that patient had set for herself. Normalized and validated expressed thoughts and emotions. Identified the theme of ageism. Used reflective statements to assist the patient. Identified the theme of self-worth. Explored what self-worth looks like to the patient. Processed how patient would know she has self-worth. Processed the idea that the barrier of establishing boundaries is looking for worth from others. Provided empathic statements. Assessed for suicidal and homicidal ideation.    Homework: Reflect on what bring self-worth  Next Session: Review homework and emotional support  Diagnosis: F41.1 generalized anxiety disorder and F33.0 major depressive affective disorder, recurrent, mild   Plan:   Goals Alleviate depressive symptoms Recognize, accept, and cope with depressive feelings Develop healthy thinking patterns Develop healthy interpersonal relationships Reduce overall frequency, intensity, and duration of anxiety Stabilize anxiety level wile increasing ability to function Enhance ability to effectively cope with full variety of stressors Learn and implement coping skills that result in a reduction of anxiety   Objectives target date for all objectives is 6.30.2024 Verbalize an understanding of the cognitive, physiological, and behavioral components of anxiety Learning and  implement calming  skills to reduce overall anxiety Verbalize an understanding of the role that cognitive biases play in excessive irrational worry and persistent anxiety symptoms Identify, challenge, and replace based fearful talk Learn and implement problem solving strategies Identify and engage in pleasant activities Learning and implement personal and interpersonal skills to reduce anxiety and improve interpersonal relationships Learn to accept limitations in life and commit to tolerating, rather than avoiding, unpleasant emotions while accomplishing meaningful goals Identify major life conflicts from the past and present that form the basis for present anxiety Maintain involvement in work, family, and social activities Reestablish a consistent sleep-wake cycle Cooperate with a medical evaluation  Cooperate with a medication evaluation by a physician Verbalize an accurate understanding of depression Verbalize an understanding of the treatment Identify and replace thoughts that support depression Learn and implement behavioral strategies Verbalize an understanding and resolution of current interpersonal problems Learn and implement problem solving and decision making skills Learn and implement conflict resolution skills to resolve interpersonal problems Verbalize an understanding of healthy and unhealthy emotions verbalize insight into how past relationships may be influence current experiences with depression Use mindfulness and acceptance strategies and increase value based behavior  Increase hopeful statements about the future.  Interventions Engage the patient in behavioral activation Use instruction, modeling, and role-playing to build the client's general social, communication, and/or conflict resolution skills Use Acceptance and Commitment Therapy to help client accept uncomfortable realities in order to accomplish value-consistent goals Reinforce the client's insight into the role  of his/her past emotional pain and present anxiety  Support the client in following through with work, family, and social activities Teach and implement sleep hygiene practices  Refer the patient to a physician for a psychotropic medication consultation Monito the clint's psychotropic medication compliance Discuss how anxiety typically involves excessive worry, various bodily expressions of tension, and avoidance of what is threatening that interact to maintain the problem  Teach the patient relaxation skills Assign the patient homework Discuss examples demonstrating that unrealistic worry overestimates the probability of threats and underestimates patient's ability  Assist the patient in analyzing his or her worries Help patient understand that avoidance is reinforcing  Consistent with treatment model, discuss how change in cognitive, behavioral, and interpersonal can help client alleviate depression CBT Behavioral activation help the client explore the relationship, nature of the dispute,  Help the client develop new interpersonal skills and relationships Conduct Problem solving therapy Teach conflict resolution skills Use a process-experiential approach Conduct TLDP Conduct ACT Evaluate need for psychotropic medication Monitor adherence to medication   The patient and clinician reviewed the treatment plan on 7.6.2023. The patient approved of the treatment plan.   Conception Chancy, PsyD

## 2022-06-18 NOTE — Telephone Encounter (Signed)
She returned my call, wants to attend next PREP class at [facility] starting July 17, every M/W 2-3:15p; assessment visit scheduled for July 13 at 2pm.

## 2022-06-19 ENCOUNTER — Telehealth (HOSPITAL_BASED_OUTPATIENT_CLINIC_OR_DEPARTMENT_OTHER): Payer: Self-pay | Admitting: *Deleted

## 2022-06-19 NOTE — Procedures (Signed)
   Patient Name: Heidi Herrera, Heidi Herrera Date:06/18/2022 Gender: Female D.O.B: Jun 05, 1966 Age (years): 55 Referring Provider: Skeet Latch Height (inches): 46 Interpreting Physician: Fransico Him MD, ABSM Weight (lbs): 183 RPSGT: Baxter Flattery BMI: 30 MRN: [id number] Neck Size: 14.50  CLINICAL INFORMATION Sleep Study Type: NPSG  Indication for sleep study: Snoring, Witnesses Apnea / Gasping During Sleep  Epworth Sleepiness Score: 11  SLEEP STUDY TECHNIQUE As per the AASM Manual for the Scoring of Sleep and Associated Events v2.3 (April 2016) with a hypopnea requiring 4% desaturations.  The channels recorded and monitored were frontal, central and occipital EEG, electrooculogram (EOG), submentalis EMG (chin), nasal and oral airflow, thoracic and abdominal wall motion, anterior tibialis EMG, snore microphone, electrocardiogram, and pulse oximetry.  MEDICATIONS Medications self-administered by patient taken the night of the study : CARVEDILOL, REFRESH MEG 3  SLEEP ARCHITECTURE The study was initiated at 10:59:58 PM and ended at 5:05:48 AM.  Sleep onset time was 37.0 minutes and the sleep efficiency was 72.4%. The total sleep time was 264.8 minutes.  Stage REM latency was 82.0 minutes.  The patient spent 3.2% of the night in stage N1 sleep, 78.5% in stage N2 sleep, 0.0% in stage N3 and 18.3% in REM.  Alpha intrusion was absent.  Supine sleep was 100.00%.  RESPIRATORY PARAMETERS The overall apnea/hypopnea index (AHI) was 3.4 per hour. There were 0 total apneas, including 0 obstructive, 0 central and 0 mixed apneas. There were 15 hypopneas and 0 RERAs.  The AHI during Stage REM sleep was 8.7 per hour.  AHI while supine was 3.4 per hour.  The mean oxygen saturation was 94.1%. The minimum SpO2 during sleep was 87.0%.  moderate snoring was noted during this study.  CARDIAC DATA The 2 lead EKG demonstrated sinus rhythm. The mean heart rate was 66.0 beats per minute. Other  EKG findings include: None.  LEG MOVEMENT DATA The total PLMS were 0 with a resulting PLMS index of 0.0. Associated arousal with leg movement index was 0.0 .  IMPRESSIONS - No significant obstructive sleep apnea occurred during this study (AHI = 3.4/h). - Mild oxygen desaturation was noted during this study (Min O2 = 87.0%). - The patient snored with moderate snoring volume. - No cardiac abnormalities were noted during this study. - Clinically significant periodic limb movements did not occur during sleep. No significant associated arousals.  DIAGNOSIS - Normal Study  RECOMMENDATIONS - Avoid alcohol, sedatives and other CNS depressants that may worsen sleep apnea and disrupt normal sleep architecture. - Sleep hygiene should be reviewed to assess factors that may improve sleep quality. - Weight management and regular exercise should be initiated or continued if appropriate.  [Electronically signed] 06/19/2022 03:27 PM  Fransico Him MD, ABSM Diplomate, American Board of Sleep Medicine

## 2022-06-19 NOTE — Telephone Encounter (Signed)
Labs are 66 from 06/02/2022 aldosterone 12.6, renin less than 0.167, WBC 3, hemoglobin 11.4, hematocrit 34.6, glucose 63, creatinine 1.2, GFR 53, NA 145, chloride 108, K4.2.  May continue Lisinopril and Carvedilol. Ensure taking Carvedilol about 12 hours apart and taking Lisinopril at same time each day.   BP not yet consistently at goal of less than 130/80.  Her electrolytes were normal and kidney function stable on her labs.    Palpitations can be caused by dehydration, stress, extra caffeine. Ensure staying hydrated, reduce caffeine intake.  Would she be willing to try Eplerenone at reduced dose of '25mg'$  daily? If not, alternatively could trial increased dose of Coreg 1.5 tablets twice daily (18.'75mg'$ )  Loel Dubonnet

## 2022-06-19 NOTE — Telephone Encounter (Signed)
Spoke with patient and she could not tolerate the Eplerenone  Stated she felt sick and heart felt like it was skipping beats Did d/c and resume the Lisinopril and Carvedilol   She is doing much better on this combination, switched times taking and thinks that has helped  Patient has missed a couple of evening doses of her meds but not many   She had labs with nephrologist 7/20, have printed for Catilin to review

## 2022-06-19 NOTE — Telephone Encounter (Signed)
-----   Message from Loel Dubonnet, NP sent at 06/18/2022  4:41 PM EDT ----- Thank you for update. Will ask Rip Harbour to call patient. Eplerenone started 6/20 with recommendation for BMP in 2 weeks.  Not yet collected.    Marven Veley-can you please call and remind her to have blood work collected?  This will allow [place] to determine next step for medication.  Additionally did she want to resume the metoprolol after stopping the carvedilol or did she deferred due to low heart rate?  TY! ----- Message ----- From: Jarrett Soho, RN Sent: 06/18/2022  10:50 AM EDT To: Rockne Menghini, RPH-CPP; #  Hello team,   I wanted to make you aware of one of the [facility] patients, Rollene Fare. Her 2 week averages are 143/94, with yesterday and today's DBP 98-100. Last med change was on 6/20 with start of eplerenone '50mg'$  QD and stopping of of carvedilol. Next appt is 8/4. I have sent a message to the patient through [facility] to inquire about any recent changes in diet, exercise, or stress level but I have not received a return message.  Thank you,  Roni Bread

## 2022-06-22 ENCOUNTER — Encounter (HOSPITAL_BASED_OUTPATIENT_CLINIC_OR_DEPARTMENT_OTHER): Payer: Self-pay

## 2022-06-22 NOTE — Telephone Encounter (Signed)
Left message to call back  

## 2022-06-22 NOTE — Progress Notes (Signed)
[facility] PREP Evaluation  Patient Details  Name: SHILOH SOUTHERN MRN: [id number] Date of Birth: [birth year] Age: 56 y.o. PCP: Simona Huh, NP  Vitals:   06/22/22 1534  BP: (!) 160/86  Pulse: 70  SpO2: 97%  Weight: 187 lb 12.8 oz (85.2 kg)     [facility] Eval - 06/22/22 1500       [facility] "PREP" Location   [facility] "PREP" Location [facility]      Referral    Referring Provider A. Truman Hayward    Reason for referral Hypertension;Inactivity    Program Start Date 06/29/22      Measurement   Waist Circumference 39 inches    Hip Circumference 43.25 inches    Body fat 38.9 percent      Information for Trainer   Goals --   Establish exercise routine; move more   Current Exercise --   occassional stationary bike   Orthopedic Concerns --   deg. cerv disc disease   Pertinent Medical History --   HTN     Timed Up and Go (TUGS)   Timed Up and Go Low risk <9 seconds      Mobility and Daily Activities   I find it easy to walk up or down two or more flights of stairs. 4    I have no trouble taking out the trash. 4    I do housework such as vacuuming and dusting on my own without difficulty. 4    I can easily lift a gallon of milk (8lbs). 4    I can easily walk a mile. 4    I have no trouble reaching into high cupboards or reaching down to pick up something from the floor. 4    I do not have trouble doing out-door work such as Armed forces logistics/support/administrative officer, raking leaves, or gardening. 4      Mobility and Daily Activities   I feel younger than my age. 4    I feel independent. 4    I feel energetic. 3    I live an active life.  1    I feel strong. 3    I feel healthy. 2    I feel active as other people my age. 1      How fit and strong are you.   Fit and Strong Total Score 46            Past Medical History:  Diagnosis Date   Acid reflux    Allergy    Anemia    Anxiety    Fibroids    GERD (gastroesophageal reflux disease)    H. pylori infection    Hypertension    Kidney failure    stage 2    Migraine    Obesity    Polycystic kidney disease    Sebaceous cyst    Sessile colonic polyp    Vertigo    Past Surgical History:  Procedure Laterality Date   IR GENERIC HISTORICAL  12/01/2016   IR ANGIOGRAM PELVIS SELECTIVE OR SUPRASELECTIVE 12/01/2016 Sandi Mariscal, MD [facility]   IR GENERIC HISTORICAL  12/01/2016   IR US GUIDE VASC ACCESS RIGHT 12/01/2016 Sandi Mariscal, MD [facility]   IR GENERIC HISTORICAL  12/01/2016   IR EMBO ARTERIAL NOT HEMORR HEMANG INC GUIDE ROADMAPPING 12/01/2016 Sandi Mariscal, MD [facility]   IR GENERIC HISTORICAL  12/01/2016   IR ANGIOGRAM SELECTIVE EACH ADDITIONAL VESSEL 12/01/2016 Sandi Mariscal, MD [facility]   IR GENERIC HISTORICAL  12/01/2016   IR ANGIOGRAM SELECTIVE EACH ADDITIONAL VESSEL 12/01/2016 Sandi Mariscal, MD [facility]   IR GENERIC HISTORICAL  12/01/2016   IR ANGIOGRAM PELVIS SELECTIVE OR SUPRASELECTIVE 12/01/2016 Sandi Mariscal, MD [facility]   IR GENERIC HISTORICAL  01/07/2017   IR RADIOLOGIST EVAL & MGMT 01/07/2017 Sandi Mariscal, MD [facility]   IR RADIOLOGIST EVAL & MGMT  08/31/2017   LOBECTOMY  2009   MYOMECTOMY  2007   UTERINE FIBROID EMBOLIZATION     12/01/2016   Social History   Tobacco Use  Smoking Status Never  Smokeless Tobacco Never  To begin PREP class at [facility] July 17, every M/W 2-3:15p  Yevonne Aline 06/22/2022, 3:38 PM

## 2022-06-24 ENCOUNTER — Telehealth (HOSPITAL_BASED_OUTPATIENT_CLINIC_OR_DEPARTMENT_OTHER): Payer: Self-pay | Admitting: Cardiovascular Disease

## 2022-06-24 NOTE — Telephone Encounter (Signed)
Patient would like for [place] to send a referral to Dr. Michail Sermon at [place]. She does not want to go to [facility] .  Please send new referral  via fax to [phone]

## 2022-06-24 NOTE — Telephone Encounter (Signed)
Advised patient referral was placed for Dr Michail Sermon, will resend referral   Concerned about blood pressure, readings are still high at times  Takes Lisinopril in am and by noon time she feels like her stomach is bloated, feels swollen all over Tingling in legs Sometimes in mid to upper back feels tight, even with just movement Concerned coming from kidneys  Takes Lisinopril in morning  Carvedilol noon and at bedtime - this does help with palpitations   Sometimes feels faint in middle of night when she wakes up, before sitting/standing Feels like something going on with her sleep, did have sleep study   Will forward to Dr Oval Linsey for review

## 2022-06-25 NOTE — Telephone Encounter (Signed)
   Pt is calling back and requesting to speak with Rip Harbour again.

## 2022-06-25 NOTE — Telephone Encounter (Signed)
Pt. Returning your call.

## 2022-06-25 NOTE — Telephone Encounter (Signed)
Left message to call back  

## 2022-06-29 ENCOUNTER — Telehealth: Payer: Self-pay

## 2022-06-29 ENCOUNTER — Ambulatory Visit: Payer: BC Managed Care – PPO | Admitting: Psychologist

## 2022-06-29 NOTE — Telephone Encounter (Signed)
She is not going to be able to attend PREP at [facility], has a last minute work commitment that will prevent her from attending/participating; she will need evening class so have asked Pam RN [facility] to contact her about next evening class at [place] in September.

## 2022-07-01 ENCOUNTER — Telehealth: Payer: Self-pay

## 2022-07-01 DIAGNOSIS — Z0000 Encounter for general adult medical examination without abnormal findings: Secondary | ICD-10-CM

## 2022-07-01 NOTE — Telephone Encounter (Signed)
Called patient to determine if she selected that she had questions about her medication in [place] by accident or if she did have questions to route her to the right resource. Patient stated that she selected that option by accident. Patient mentioned that she was waiting for a call from [facility] regarding her new medication regimen. Patient began to share her concerns with taking lisinopril and wonder if it has any effect on her digestive system. Patient stated that she also experiences pain similar to a tingling sensation under her right rib cage. Patient expressed since taking carvedilol that her legs are not swollen, however, she is still experiencing bloating all over.   Patient mentioned that her blood pressure fluctuates and is not sure why. Patient was asked if she is monitoring her salt intake by reading food labels and keeping her sodium consumption around 1,'500mg'$  per day as recommended by Dr. Oval Linsey per [place] on 5/31. Patient stated that she is not tracking her sodium consumption on that level but doesn't think she is consuming much salt. Suggested to patient to try reading food labels consistently and monitor the foods that she eats regularly to determine how much sodium she is eating at a given time and began to balance her sodium allotment for the day between the foods she eats. Patient stated that she will be seeing a nutritionist next month and believe that working with them will be helpful.   Noticed that patient's blood pressure readings tend to be higher in the mornings than in the evenings. Advised patient to take her medications in the morning at least 30 minutes prior to checking her blood pressure and ensuring that she has been settled for at least 5-10 minutes before checking when getting prepared for her day. Patient stated that this routine will be a challenge for her to take her medications before checking her blood pressure before work because she needs to eat, which she does eat  before 9am. Patient was encouraged to take her bp cuff with her to work, but patient stated that she prefers not to.   Patient mentioned that she takes lisinopril in the morning, carvedilol at noon at in the evening but she forgets sometimes and have started writing it down. Patient asked if there was something she could do to be more compliant because she wasn't sure if missing a dose was interfering with the effectiveness of the medications. Suggested that the patient try setting an alarm for each time she need to take her medications to help her get on a schedule. Patient agreed that was a good idea because she does it for other things she have to do.   Patient was asked if she was stressed and if so, how was she managing her stress. Patient mentioned that she is dealing with some stressful situations, do not have a support system, and internalize her emotions. Patient mentioned that she is having sessions with Dr. Michail Sermon, and she like it so far, but she needs to find ways to deal with things that trigger her now. Suggested that patient try take 2-3-minute breaks from the computer to stretch, get some fresh air, practice deep breathing/grounding, and/or implement a wind down routine in the evening that may include walking or writing in a journal.   Patient mentioned that she is going to start participating in PREP this September. Patient thought that journaling would be helpful and wanted to know more. Gave examples of writing about her stressors; how she currently responds to her  stress, and how she would like to work on coping better. Or she could create a gratitude journal to think about the positive aspects of her life and what she has control over to assist with changing how she perceive and process stressors. Patient stated that gratitude journaling would be helpful because it can help offset the negative energy that she experiences. Patient plans to start writing in a gratitude journal to help with  reducing her stress. Patient requested some information on journaling and was sent various journal techniques.   Also suggested practicing positive self-talk due to internalization of emotions. Patient stated that she would like to try working on some of these steps to see if she can help improve her blood pressure and give the medication a chance to work and decided that she would wait regarding talking with Rip Harbour about her medications and work on things she has control over first. Patient mentioned that if her blood pressure is not where Dr. Oval Linsey wants it to be by her next appointment, she will discuss medications. Patient was extended the invitation to participate in health coaching if she would like the additional support and accountability to making lifestyle changes in addition to working with Dr. Michail Sermon. Patient will reach out if she decides she needs the additional help.     Heidi Herrera, [facility] Guide, Health Coach [address] [place] [address] Telephone: [phone] Email: [name]

## 2022-07-05 DIAGNOSIS — I10 Essential (primary) hypertension: Secondary | ICD-10-CM

## 2022-07-06 ENCOUNTER — Telehealth: Payer: Self-pay | Admitting: Cardiovascular Disease

## 2022-07-06 ENCOUNTER — Telehealth: Payer: Self-pay

## 2022-07-06 DIAGNOSIS — Z0000 Encounter for general adult medical examination without abnormal findings: Secondary | ICD-10-CM

## 2022-07-06 NOTE — Telephone Encounter (Signed)
Patient called asking for recommendations for a PCP. Patient stated that she need a PCP that will help identify what caused her to develop hypertension. Informed patient that I cannot recommend a provider but can email a list of Primary Care offices that she can utilize to identify a provider that accepts her insurance. Discussed with patient regarding calling the number on the back of her insurance card to identify a PCP that accepts her insurance. Patient stated that she has tried that and would like change providers. Patient was emailed the updated 2023 Merged PCP List along with the [facility]. Patient contacted an office but the provider that she was interested in seeing is not taking new patient's at this time. Patient has had fluctuations in her blood pressure in the past 5 days that has now caused her to feel as stated by the patient. Asked patient if she have been monitoring what she eats after our brief health coaching session. Patient stated that she has been monitoring her salt intake and doesn't think that it is related to her food. Patient is taking medications as prescribed. Patient stated that she would reach out to [facility] to discuss her current symptoms and ask Dr. Oval Linsey if she can refer her to a PCP. Encouraged patient to send a MyChart message regarding these concerns. Patient stated that it is hard to interpret messages from MyChart at times and prefer to call the office.    Lopaka Karge Truman Hayward, [facility] Guide, Health Coach [address] [place] [address] Telephone: [phone] Email: [email or web address]

## 2022-07-06 NOTE — Telephone Encounter (Signed)
Pt c/o medication issue:  1. Name of Medication: carvedilol (COREG) 12.5 MG tablet  2. How are you currently taking this medication (dosage and times per day)? As prescribed   3. Are you having a reaction (difficulty breathing--STAT)? Yes  4. What is your medication issue? Pt states that this medication seems make her really tired, and she believes it is also the cause of her heart skipping a beat this weekend. Requesting to speak to someone.

## 2022-07-06 NOTE — Telephone Encounter (Signed)
RN returned call to patient, patient would thinks that her Carvedilol is making her extremely tired, patient states this is the first day that she has felt this way . BP over the weekend was elevated. Patient endorses memory issues as well, but claims it isn't new, she states she wakes up in the night feeling like she has forgotten something. Patient states she has been taking the carvedilol.    Pt. Was also asking for a referral to PCP, provided patient with [facility] information, patient states she already has this but wanted a specific recommendation from Dr. Oval Linsey, informed patient that we do not typically give out specific primary care recommendations.    Patient also asking about some renal labs that she had done that [place] said were being reviewed.

## 2022-07-08 NOTE — Telephone Encounter (Signed)
[place] patient, she is aware that Oval Linsey out of office, but keeps calling can you advise please

## 2022-07-08 NOTE — Telephone Encounter (Signed)
Labs from 06/02/22 no anemia nor infection. WBC mildly low which is stable from previous. Stable kidney function. Normal electrolytes.   Carvedilol prevents palpitations, does not cause them. To prevent palpitations - manage stress well, avoid caffeine, stay well hydrated. She is on a relatively low dose of Carvedilol so I would not anticipate it would cause significant fatigue. She should continue her current medications, continue monitoring BP twice per day, and follow up as scheduled with the pharmacist 07/17/22.   Memory difficulties should be addressed by primary care provider. The [facility] number will help her find one close to her home who accepts new patients and her insurance.   Loel Dubonnet, NP

## 2022-07-08 NOTE — Telephone Encounter (Signed)
RN returned call to patient and provided the following recommendations to the patient. Patient very unhappy about not being able to speak with Dr. Oval Linsey. Patient states she does not believe that anything else could be causing her issues and she feels that her blood pressure is not being well controlled. RN again advised patient that she has an appointment on August 4th with PharmD to address her blood pressure issues.      "Labs from 06/02/22 no anemia nor infection. WBC mildly low which is stable from previous. Stable kidney function. Normal electrolytes.    Carvedilol prevents palpitations, does not cause them. To prevent palpitations - manage stress well, avoid caffeine, stay well hydrated. She is on a relatively low dose of Carvedilol so I would not anticipate it would cause significant fatigue. She should continue her current medications, continue monitoring BP twice per day, and follow up as scheduled with the pharmacist 07/17/22.    Memory difficulties should be addressed by primary care provider. The [facility] number will help her find one close to her home who accepts new patients and her insurance.    Loel Dubonnet, NP "

## 2022-07-08 NOTE — Telephone Encounter (Signed)
Patient returned RN's call. 

## 2022-07-08 NOTE — Telephone Encounter (Signed)
Patient called to check on status of her call.

## 2022-07-10 ENCOUNTER — Encounter: Payer: BC Managed Care – PPO | Attending: Cardiovascular Disease | Admitting: Dietician

## 2022-07-10 ENCOUNTER — Encounter: Payer: Self-pay | Admitting: Dietician

## 2022-07-10 DIAGNOSIS — K5900 Constipation, unspecified: Secondary | ICD-10-CM

## 2022-07-10 DIAGNOSIS — K219 Gastro-esophageal reflux disease without esophagitis: Secondary | ICD-10-CM | POA: Insufficient documentation

## 2022-07-10 DIAGNOSIS — N19 Unspecified kidney failure: Secondary | ICD-10-CM | POA: Insufficient documentation

## 2022-07-10 DIAGNOSIS — Z713 Dietary counseling and surveillance: Secondary | ICD-10-CM | POA: Insufficient documentation

## 2022-07-10 DIAGNOSIS — N1831 Chronic kidney disease, stage 3a: Secondary | ICD-10-CM

## 2022-07-10 DIAGNOSIS — I10 Essential (primary) hypertension: Secondary | ICD-10-CM | POA: Diagnosis present

## 2022-07-10 NOTE — Patient Instructions (Addendum)
Limit salt and avoid salt-substitutes that contain the ingredient potassium   When following recipes, limit or avoid using salt  Look for low-sodium salad dressings or try the following options:  -Ms. Dash, olive oil, and lemon juice  -Balsamic vinegar and olive oil  Limit high-phosphorus containing foods  -avoid dark sodas/colas  Try to balance out your meals in order to feel full and energized  -Include a protein: tofu, beans, 3oz fish, nuts/seeds, hummus  Consider eating 2 Tablespoons ground flaxseeds daily  -Start slow, 1 Tablespoon, then advance to 2.   -Try adding to your yogurt, a smoothie, or salad dressing  -This may help with constipation/blood pressure  For a savory/warm breakfast dish look up a "tofu scramble" recipe  -saute veggies and/or add beans  -add into a gluten free tortilla or on top of gluten free toast  -avoid adding salt- try garlic, low sodium broth, turmeric  -nutritional yeast can add a cheesy flavor  For a sweet/warm breakfast try oatmeal cooked in almond milk. Add chopped dried figs and nuts or nut butter.   Options for gluten-free carb choices:  -quinoa, brown rice, gluten free tortillas  Resources:  -Delsa Bern  -PB with J (YouTube)  -Dondra Prader (YouTube)  -Nicola Girt ([place])

## 2022-07-10 NOTE — Progress Notes (Signed)
Medical Nutrition Therapy  Appointment Start time:  [phone]  Appointment End time:  9476 Pt is here today alone.    Primary concerns today: Pt states that she has cysts on her kidneys. She states she has been watching her intake of potassium, phosphorus, protein and sodium.She would like to learn about how to eat to get the nutrients/calories she needs in order to feel like she has enough energy, while also balancing her nutrients to protect her kidneys.  Pt states sometimes at night she feels faint and has heart palpitations. Once this happened and her blood pressure was 180/110. Blood pressure this morning was 137/82, but says it still spikes every few days.  Referral diagnosis: Essential HTN, GERD, kidney failure Preferred learning style: no preference indicated Learning readiness: change in progress  NUTRITION ASSESSMENT   Anthropometrics  Wt- 186lb She states her weight has been stable recently.  Noted 183 lbs 06/18/2022. 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 She states that sometimes her joints ache when she eats but isn't sure what causes it. She notices it more with chicken, eggs, and sugary foods. She does not eat beef or pork. She tries to eat mostly plant-based but does not feel like she is getting enough nutrition.  Food Allergies (per skin prick test per patient): gluten (bloat, fullness), corn (she does not note symptoms), peanuts (itches), aspartame (frequent urination) Likely lactose intolerant  Estimated daily fluid intake: 48 oz water, 16oz tea Supplements: Vitamin B12, MVI, Iron at times (causes  constipation), Calcium+Vitamin D. Sleep: 7 hours with interruptions to use the restroom. Only feels well-rested sometimes. Goes to bed 10:30/11pm. Stress / self-care: feels that her stress is medium/high Current average weekly physical activity: Has been exercising less since she feels like she is not eating enough. Has a stationary bike and rides for 15 minutes 2x/week. She also goes walking around stores.   24-Hr Dietary Recall First Meal: Fruit salad (blueberries, strawberries, peach) with almond yogurt and nuts OR gluten free bread and vegetarian sausage OR cheese or grilled chicken biscuit from [employer] OR chickpea scramble Snack: none OR trail mix Second Meal: beans LS OR soup Snack: fruit (plums) OR nuts OR homemade walnut butter with GF bread Third Meal: [employer] hot bar (green beans, cabbage, and tofu) OR  Snack: none OR nuts OR occasional plantains OR non-dairy [employer]  Beverages: water, ginger tea  Estimated Energy Needs Calories: 2000  Protein: 55-66 g   NUTRITION DIAGNOSIS  NB-1.1 Food and nutrition-related knowledge deficit As related to multiple food allergies, CKD and other concerns.  As evidenced by diet hx and patient report.   NUTRITION INTERVENTION  Nutrition education (E-1) on the following topics:  Kidney nutrition therapy Plant based protein options Continued low sodium Avoid potassium containing seasonings  No potassium restriction in real foods Avoid foods that have phos... in the ingredient list Discussed benefits of a plant based diet Simple meal ideas to creat better balance Importance of adequate nutrition for fullness, to meet nutrient needs, and improve energy Resources for recipes Discussion of her food allergies and intolerances Grain sources that do not contain gluten/wheat  Handouts Provided Include  NKD national kidney diet - Dish up a Kidney-Friendly Meal for Patients with Chronic Kidney Disease (not on dialysis)  Low  Carbohydrate Snack Suggestions  Learning Style & Readiness for Change Teaching method utilized: Visual & Auditory  Demonstrated degree of understanding via: Teach Back  Barriers to learning/adherence to lifestyle change: stress, increased health concerns and food intolerances  Goals Established by Pt Continue low sodium diet Simple meal planning  Limit salt and avoid salt-substitutes that contain the ingredient potassium   Look for low-sodium salad dressings or try the following options:  -Ms. Dash, olive oil, and lemon juice  -Balsamic vinegar and olive oil  Limit high-phosphorus containing foods  -avoid dark sodas/colas  Try to balance out your meals in order to feel full and energized  -Include a protein: tofu, beans, 3oz fish, nuts/seeds, hummus  Consider eating 2 Tablespoons ground flaxseeds daily  -Start slow, 1 Tablespoon, then advance to 2.   -Try adding to your yogurt, a smoothie, or salad dressing  -This may help with constipation/blood pressure  For a savory/warm breakfast dish look up a "tofu scramble" recipe  -saute veggies and/or add beans  -add into a gluten free tortilla or on top of gluten free toast  -avoid adding salt- try garlic, low sodium broth, turmeric  -nutritional yeast can add a cheesy flavor  For a sweet/warm breakfast try oatmeal cooked in almond milk. Add chopped dried figs and nuts or nut butter.   Options for gluten-free carb choices:  -quinoa, brown rice, gluten free tortillas  Resources:  -Delsa Bern  -PB with J (YouTube)  -Dondra Prader (YouTube)  -Nicola Girt (Cookbook & YouTube)  MONITORING & EVALUATION Dietary intake, weekly physical activity, and follow-up in 2 months.  Next Steps  Patient is to call for questions.

## 2022-07-13 NOTE — Telephone Encounter (Signed)
RN attempted to call patient, no answer, unable to leave VM. Will send follow up mychart message.     "If she felt better on the metoprolol, OK to stop carvedilol and go back on the metoprolol, mostly to help her palpitations.  Add spironolactone '25mg'$  and get a BMP at her follow up on 8/4.   TCR "

## 2022-07-13 NOTE — Addendum Note (Signed)
Addended by: Gerald Stabs on: 07/13/2022 04:59 PM   Modules accepted: Orders

## 2022-07-14 ENCOUNTER — Telehealth: Payer: Self-pay | Admitting: *Deleted

## 2022-07-14 ENCOUNTER — Ambulatory Visit: Payer: BC Managed Care – PPO | Admitting: Psychologist

## 2022-07-14 NOTE — Telephone Encounter (Signed)
-----   Message from Lauralee Evener, [place] sent at 07/14/2022  8:25 AM EDT -----  ----- Message ----- From: Sueanne Margarita, MD Sent: 06/19/2022   3:31 PM EDT To: Cv Div Sleep Studies  Please let patient know that sleep study showed no significant sleep apnea.

## 2022-07-14 NOTE — Telephone Encounter (Signed)
The patient has been notified of the result. Per dpr left message on cell phone and informed patient to call back with questions. Marolyn Hammock, [place] 07/14/2022 8:55 AM

## 2022-07-15 NOTE — Telephone Encounter (Signed)
3rd call attempt, no answer, left message, patient also has not read mychart message. Removing from triage.

## 2022-07-17 ENCOUNTER — Ambulatory Visit (HOSPITAL_BASED_OUTPATIENT_CLINIC_OR_DEPARTMENT_OTHER): Payer: BC Managed Care – PPO | Admitting: Pharmacist Clinician (PhC)/ Clinical Pharmacy Specialist

## 2022-07-17 ENCOUNTER — Encounter (HOSPITAL_BASED_OUTPATIENT_CLINIC_OR_DEPARTMENT_OTHER): Payer: Self-pay | Admitting: Pharmacist Clinician (PhC)/ Clinical Pharmacy Specialist

## 2022-07-17 DIAGNOSIS — I10 Essential (primary) hypertension: Secondary | ICD-10-CM

## 2022-07-17 MED ORDER — VALSARTAN 160 MG PO TABS: 160.0000 mg | ORAL_TABLET | Freq: Every day | ORAL | 6 refills | Status: DC

## 2022-07-17 NOTE — Patient Instructions (Signed)
Thank you for choosing [facility] your blood pressure at home twice daily and keep record of the readings.  Take your BP meds as follows:  Stop lisinopril  Start valsartan 160 mg daily  Bring all of your meds, your BP cuff and your record of home blood pressures to your next appointment.  Exercise as you're able, try to walk approximately 30 minutes per day.  Keep salt intake to a minimum, especially watch canned and prepared boxed foods.  Eat more fresh fruits and vegetables and fewer canned items.  Avoid eating in fast food restaurants.    HOW TO TAKE YOUR BLOOD PRESSURE: Rest 5 minutes before taking your blood pressure.  Don't smoke or drink caffeinated beverages for at least 30 minutes before. Take your blood pressure before (not after) you eat. Sit comfortably with your back supported and both feet on the floor (don't cross your legs). Elevate your arm to heart level on a table or a desk. Use the proper sized cuff. It should fit smoothly and snugly around your bare upper arm. There should be enough room to slip a fingertip under the cuff. The bottom edge of the cuff should be 1 inch above the crease of the elbow. Ideally, take 3 measurements at one sitting and record the average.

## 2022-07-17 NOTE — Assessment & Plan Note (Addendum)
Patient with essential hypertension, hard to treat secondary to multiple medication intolerances.  Had a long discussion about medications and their potential side effects.  Reminded patient that when same symptoms occur with different types of medications, the symptom may not have anything to do with meds, but changing physiology. Office reading elevated today, however home Vivify readings are much better.  There are very few choices to add to her current medications and she is not liking lisinopril.  Will have her switch to valsartan 160 mg, which she had been on until recall in 2018.  I told her that if her home pressure starts to increase over the next 2-3 weeks, I will need to increase to the 320 mg dose.  Will monitor in [place].  Return visit with Dr. Oval Linsey in 2 months to complete Vivify study.

## 2022-07-17 NOTE — Progress Notes (Signed)
07/17/2022 Heidi Herrera [birth year] [id number]   HPI:  Heidi Herrera is a 56 y.o. female patient of Dr Oval Linsey, with a [place] below who presents today for advanced hypertension clinic follow up.   She was seen by Dr. Oval Linsey in May, at which time her pressure was 158/94 and she was noted to have multiple medication sensitivities.  At that time she was on lisinopril and metoprolol, and tolerating both, although not getting BP control.  Metoprolol was switched to carvedilol.  She was also referred for a sleep study (it was negative).  Since that appointment she has called several times with medication concerns and I am not sure of what she is actually on.    Today she returns for follow up.  She did not switch back to metoprolol, and is currently on lisinopril 40 mg daily and carvedilol 12.5 mg bid.  She notes that about an hour after taking lisinopril she has a burning pain in the area of her liver.  Also notes episodes of forgetfulness as well as occasional skipped heartbeats.   She recently spoke with a dietician - to help make sure she is getting a balanced diet with the proper nutrients, as she is eating a mostly plant based diet.   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)     Blood Pressure Goal:  130/80  Current Medications: carvedilol 12.5 mg bid (noon and evening), lisinopril 40 mg qd  Family Hx:  both parents deceased, mother had htn, stroke, maternal uncle with heart problems; 3 living sisters all with htn, 2 w/ DM; 4 brothers living 1 with htn, DM; 1 son, DM - controlled with diet  Social Hx: no tobacco, no alcohol, only occasional herbal tea   Diet: mostly home cooked, occasional whole foods items; snack is trail mix or fruit, popcorn; can't eat chicken - causes joints to hurt, no beef or pork; plant based diet with occasional salmon or shrimp, beans/lentils daily;    Exercise: stationary bike - 10 minutes  Home BP readings: per vivify  14 day average 137/87 HR 60 (range 124-157/67-97)  30 day average 138/89 HR 62 (range 122-183/67-118)  Intolerances: nebivolol - anaphylaxis,  amlodipine - feels poorly,  carvedilol - edema (legs),  hydralazine - palpitations,  eplerenone - sick, heart skips beats  Labs: 6/23: Na 145, K 4.2, Glu 63, BUN 13, SCr 1.2   Wt Readings from Last 3 Encounters:  07/17/22 187 lb 1 oz (84.8 kg)  07/10/22 186 lb (84.4 kg)  06/22/22 187 lb 12.8 oz (85.2 kg)   BP Readings from Last 3 Encounters:  07/17/22 (!) 174/91  06/22/22 (!) 160/86  05/13/22 (!) 158/94   Pulse Readings from Last 3 Encounters:  07/17/22 65  06/22/22 70  05/13/22 69    Current Outpatient Medications  Medication Sig Dispense Refill   Bacillus Coagulans-Inulin (ALIGN PREBIOTIC-PROBIOTIC PO) Take by mouth.     calcium-vitamin D (OSCAL WITH D) 500-200 MG-UNIT per tablet Take 1 tablet by mouth 2 (two) times daily. Calcium 1000 mg with Magnesium 400 mg and zinc 15 mg, taking 1 capsule 2 times daily     carvedilol (COREG) 12.5 MG tablet Take 12.5 mg by mouth 2 (two) times daily with a meal.     Cyanocobalamin (VITAMIN B 12 PO) Take 1 tablet by mouth daily.     FERROUS FUMARATE PO Take 15 mLs by  mouth daily.     Multiple Vitamin (MULTIVITAMIN WITH MINERALS) TABS tablet Take 1 tablet by mouth daily.     solifenacin (VESICARE) 5 MG tablet Take 5 mg by mouth daily as needed.     valsartan (DIOVAN) 160 MG tablet Take 1 tablet (160 mg total) by mouth daily. 30 tablet 6   No current facility-administered medications for this visit.    Allergies  Allergen Reactions   Bystolic [Nebivolol Hcl] Anaphylaxis, Itching and Swelling   Amlodipine     Feels poorly   Biaxin [Clarithromycin]     The category of the Biaxin family.  Also allergic to water chestnuts.   Coreg [Carvedilol]     Swelling in legs and a feeling of confusion at night    Eplerenone      Sick, skipped heart beats    Hydralazine     Palpitations    Peanut-Containing Drug Products Itching   Aspartame     Increased urination    Erythromycin Base Rash    Past Medical History:  Diagnosis Date   Acid reflux    Allergy    Anemia    Anxiety    Fibroids    GERD (gastroesophageal reflux disease)    H. pylori infection    Hypertension    Kidney failure    stage 2   Migraine    Obesity    Polycystic kidney disease    Sebaceous cyst    Sessile colonic polyp    Vertigo     Blood pressure (!) 174/91, pulse 65, height 5' 5.5" (1.664 m), weight 187 lb 1 oz (84.8 kg).  Essential hypertension Patient with essential hypertension, hard to treat secondary to multiple medication intolerances.  Had a long discussion about medications and their potential side effects.  Reminded patient that when same symptoms occur with different types of medications, the symptom may not have anything to do with meds, but changing physiology. Office reading elevated today, however home Vivify readings are much better.  There are very few choices to add to her current medications and she is not liking lisinopril.  Will have her switch to valsartan 160 mg, which she had been on until recall in 2018.  I told her that if her home pressure starts to increase over the next 2-3 weeks, I will need to increase to the 320 mg dose.  Will monitor in [place].  Return visit with Dr. Oval Linsey in 2 months to complete Vivify study.     Tommy Medal PharmD CPP [facility] [address] [place], [place] [address] [phone]

## 2022-07-22 NOTE — Telephone Encounter (Signed)
Patient was seen by Alena Bills D 8/4

## 2022-07-23 ENCOUNTER — Other Ambulatory Visit: Payer: Self-pay | Admitting: Gastroenterology

## 2022-07-23 DIAGNOSIS — R1011 Right upper quadrant pain: Secondary | ICD-10-CM

## 2022-07-24 ENCOUNTER — Encounter (HOSPITAL_BASED_OUTPATIENT_CLINIC_OR_DEPARTMENT_OTHER): Payer: Self-pay

## 2022-07-31 ENCOUNTER — Ambulatory Visit
Admission: RE | Admit: 2022-07-31 | Discharge: 2022-07-31 | Disposition: A | Discharge status: Home / Self Care | Payer: BC Managed Care – PPO | Source: Ambulatory Visit | Attending: Gastroenterology | Admitting: Gastroenterology

## 2022-07-31 DIAGNOSIS — R1011 Right upper quadrant pain: Secondary | ICD-10-CM

## 2022-08-04 ENCOUNTER — Telehealth (HOSPITAL_BASED_OUTPATIENT_CLINIC_OR_DEPARTMENT_OTHER): Payer: Self-pay | Admitting: *Deleted

## 2022-08-04 DIAGNOSIS — I10 Essential (primary) hypertension: Secondary | ICD-10-CM

## 2022-08-04 NOTE — Telephone Encounter (Signed)
Called Dr Joette Catching office to follow up on GI referral, patient seen 8/10

## 2022-08-06 NOTE — Telephone Encounter (Signed)
Patient seen by Pharm D 8/4

## 2022-08-19 ENCOUNTER — Encounter: Payer: BC Managed Care – PPO | Admitting: Skilled Nursing Facility1

## 2022-08-21 ENCOUNTER — Ambulatory Visit: Payer: BC Managed Care – PPO | Admitting: Psychologist

## 2022-09-03 DIAGNOSIS — I10 Essential (primary) hypertension: Secondary | ICD-10-CM

## 2022-09-10 ENCOUNTER — Ambulatory Visit: Payer: BC Managed Care – PPO | Admitting: Dietician

## 2022-09-21 ENCOUNTER — Ambulatory Visit (HOSPITAL_BASED_OUTPATIENT_CLINIC_OR_DEPARTMENT_OTHER): Payer: BC Managed Care – PPO | Admitting: Cardiovascular Disease

## 2022-09-21 ENCOUNTER — Encounter (HOSPITAL_BASED_OUTPATIENT_CLINIC_OR_DEPARTMENT_OTHER): Payer: Self-pay | Admitting: Cardiovascular Disease

## 2022-09-21 VITALS — BP 144/76 | HR 63 | Ht 65.5 in | Wt 185.9 lb

## 2022-09-21 DIAGNOSIS — Z5181 Encounter for therapeutic drug level monitoring: Secondary | ICD-10-CM | POA: Diagnosis not present

## 2022-09-21 DIAGNOSIS — Z006 Encounter for examination for normal comparison and control in clinical research program: Secondary | ICD-10-CM

## 2022-09-21 DIAGNOSIS — E269 Hyperaldosteronism, unspecified: Secondary | ICD-10-CM

## 2022-09-21 DIAGNOSIS — I10 Essential (primary) hypertension: Secondary | ICD-10-CM

## 2022-09-21 MED ORDER — SPIRONOLACTONE 25 MG PO TABS: ORAL_TABLET | ORAL | 3 refills | Status: DC

## 2022-09-21 NOTE — Patient Instructions (Addendum)
Medication Instructions:  START SPIRONOLACTONE 25 MG 1/2 TABLET DAILY   *If you need a refill on your cardiac medications before your next appointment, please call your pharmacy*  Lab Work: BMET IN 1 WEEK   If you have labs (blood work) drawn today and your tests are completely normal, you will receive your results only by: [place] (if you have MyChart) OR A paper copy in the mail If you have any lab test that is abnormal or we need to change your treatment, we will call you to review the results.  Testing/Procedures: MRI OF ABDOMEN   Follow-Up: 11/25/2022 11:30 AM WITH DR [facility]

## 2022-09-21 NOTE — Assessment & Plan Note (Addendum)
BP has been elevated home but not nearly as high as in the office.  She has white coat hypertension superimposed on hypertension.  Renin/aldo were indeterminate.  She had no adrenal adenomas on CT in 2021.  At this point she would need confirmatory testing.  This would require her to salt load and hold the valsartan.  I am worried that her blood pressure would get very high in the setting.  We will get an abdominal MRI to look for adrenal adenomas.  If none are present we will defer any further testing.  Exercise has been limited by her neck pain.  Continue to use best that she can.  Add spironolactone 12.5 mg daily and check a BMP in a week.

## 2022-09-21 NOTE — Research (Signed)
I saw pt today after Dr. Blenda Mounts follow up visit. Pt is in Dr. Blenda Mounts Virtual Care HTN Study. Pt filled out research survey. Pt was enrolled in Group 2.

## 2022-09-21 NOTE — Progress Notes (Signed)
Cardiology Office Note  Date:  09/21/2022   ID:  Heidi Herrera, DOB [birth year], MRN [id number]  PCP:  Pcp, No  Cardiologist:   Skeet Latch, MD   No chief complaint on file.   History of Present Illness: Heidi Herrera is a 56 y.o. female with hypertension, aortic atherosclerosis, non-obstructive CAD, CKD 3, and hyperlipidemia here for follow-up. She was initially seen 05/13/2022 to establish care.  She last saw Dr. Lennox Pippins 07/2021 and complained of frequent urination.  Lisinopril was increased while hydrochlorothiazide was reduced.  At that visit her blood pressure was 138/84.  She previously had a nuclear stress test 06/2021 that revealed LVEF 58% with no ischemia.  She saw her PCP 01/2022 and was no longer on HCTZ.  She had also self discontinued lisinopril and her blood pressure was 172/108.  It was recommended that she resume the lisinopril.  She followed up with the urologist and they started her on Vesicare for urinary frequency.    She saw a cardiologist at [facility] 01/2022 and reported palpitations.  She wore a 30-day event recorder which showed sinus tachycardia but no other significant arrhythmias.  Her BP was 168/98 and 154/84.  At the time she was only taking lisinopril.  She reported palpitations and had an event recorder that revealed sinus tachycardia in the middle of the night up to 140 bpm.  She was started on metoprolol succinate but still had some palpitations. Metoprolol was switched to carvedilol. She was enrolled in the remote patient monitoring study. She called the office stating that carvedilol was causing her to have palpitations. She also had concerns with her memory, and was advised to discuss with hear PCP. She saw Nehemiah Massed, PharmD 07/2022 and noted a burning pain in her liver an hour after taking lisinopril. She was switched to valsartan. It was noted that her blood pressure readings in the office were much higher than in [place]. Recently her blood pressures have  been in the 120s-140s/80s-90s.   Today, she states that she has been feeling much better. At home her blood pressure has not been as elevated as today in the clinic; today it is 160/83 bpm. When laying down she does feel palpitations but she thinks that it might be because of her neck issues. Recently she had an MRI done for her cervical spine. She was told that she might need surgery but she has been looking into alternative options. She met with a chiropractor. They told her that her spine pressing on her nerves could be the reason of her vision, blood pressure, and digestive issues. The pain on her back and neck feels like a pressure weighing her down and she hopes that by going along with this chiropractic treatment she could resolve her other issues. The pain and tightness in her neck have been getting worse. They are somewhat relieved by applying heat. When she wakes up she feels much better without much pain but after an hour she starts getting more fatigued. This is why she has not been getting much exercise. It has been getting harder to sit or stand, even currently in the clinic she is experiencing pain. She notes that she experiences numbness in the tip of her fingers and does feel weakness on her hands and legs. Also she endorses minor swelling in the evenings. With elevation her edema usually improves by the morning. She denies any chest pain, or shortness of breath. No lightheadedness, headaches, syncope, orthopnea, or PND.   Past Medical  History:  Diagnosis Date   Acid reflux    Allergy    Anemia    Anxiety    Fibroids    GERD (gastroesophageal reflux disease)    H. pylori infection    Hypertension    Kidney failure    stage 2   Migraine    Obesity    Polycystic kidney disease    Sebaceous cyst    Sessile colonic polyp    Vertigo     Past Surgical History:  Procedure Laterality Date   IR GENERIC HISTORICAL  12/01/2016   IR ANGIOGRAM PELVIS SELECTIVE OR SUPRASELECTIVE  12/01/2016 Sandi Mariscal, MD [facility]   IR GENERIC HISTORICAL  12/01/2016   IR US GUIDE VASC ACCESS RIGHT 12/01/2016 Sandi Mariscal, MD [facility]   IR GENERIC HISTORICAL  12/01/2016   IR EMBO ARTERIAL NOT HEMORR HEMANG INC GUIDE ROADMAPPING 12/01/2016 Sandi Mariscal, MD [facility]   IR GENERIC HISTORICAL  12/01/2016   IR ANGIOGRAM SELECTIVE EACH ADDITIONAL VESSEL 12/01/2016 Sandi Mariscal, MD [facility]   IR GENERIC HISTORICAL  12/01/2016   IR ANGIOGRAM SELECTIVE EACH ADDITIONAL VESSEL 12/01/2016 Sandi Mariscal, MD [facility]   IR GENERIC HISTORICAL  12/01/2016   IR ANGIOGRAM PELVIS SELECTIVE OR SUPRASELECTIVE 12/01/2016 Sandi Mariscal, MD [facility]   IR GENERIC HISTORICAL  01/07/2017   IR RADIOLOGIST EVAL & MGMT 01/07/2017 Sandi Mariscal, MD [facility]   IR RADIOLOGIST EVAL & MGMT  08/31/2017   LOBECTOMY  2009   MYOMECTOMY  2007   UTERINE FIBROID EMBOLIZATION     12/01/2016     Current Outpatient Medications  Medication Sig Dispense Refill   Bacillus Coagulans-Inulin (ALIGN PREBIOTIC-PROBIOTIC PO) Take by mouth.     calcium-vitamin D (OSCAL WITH D) 500-200 MG-UNIT per tablet Take 1 tablet by mouth 2 (two) times daily. Calcium 1000 mg with Magnesium 400 mg and zinc 15 mg, taking 1 capsule 2 times daily     carvedilol (COREG) 12.5 MG tablet Take 12.5 mg by mouth 2 (two) times daily with a meal.     Cholecalciferol (VITAMIN D3) 125 MCG (5000 UT) CAPS Take 1 capsule by mouth daily.     Cyanocobalamin (VITAMIN B 12 PO) Take 1 tablet by mouth daily.     famotidine (PEPCID) 20 MG tablet Take 20 mg by mouth 2 (two) times daily.     FERROUS FUMARATE PO Take 15 mLs by mouth daily.     Multiple Vitamin (MULTIVITAMIN WITH MINERALS) TABS tablet Take 1 tablet by mouth daily.     solifenacin (VESICARE) 5 MG tablet Take 5 mg by mouth daily as needed.     spironolactone (ALDACTONE) 25 MG tablet 1/2 TABLET DAILY 45 tablet 3   valsartan (DIOVAN) 160 MG tablet Take 1 tablet (160 mg total) by mouth  daily. 30 tablet 6   No current facility-administered medications for this visit.    Allergies:   Bystolic [nebivolol hcl], Amlodipine, Biaxin [clarithromycin], Coreg [carvedilol], Eplerenone, Hydralazine, Peanut-containing drug products, Aspartame, and Erythromycin base    Social History:  The patient  reports that she has never smoked. She has never used smokeless tobacco. She reports that she does not currently use alcohol after a past usage of about 1.0 standard drink of alcohol per week. She reports that she does not use drugs.   Family History:  The patient's family history includes Cancer in her brother; Cancer (age of onset: 82) in her sister; Diabetes in her mother, sister, and sister; Esophageal cancer in  her maternal grandfather; Glaucoma in her brother, maternal uncle, and sister; Heart attack in her sister; Heart disease in her maternal uncle; Hyperlipidemia in her sister; Hypertension in her brother, father, mother, sister, and sister; Mental illness in her father, sister, and sister; Obesity in her brother, mother, and sister; Pancreatic cancer in her cousin; Stroke in her brother, brother, and mother.    ROS:   Please see the history of present illness.    (+) Back pain  (+) Hand/Leg weakness (+) Finger numbness  (+) Minor LE Edema (+) Palpitations All other systems are reviewed and negative.    PHYSICAL EXAM: VS:  BP (!) 144/76 (BP Location: Right Arm, Patient Position: Sitting, Cuff Size: Large)   Pulse 63   Ht 5' 5.5" (1.664 m)   Wt 185 lb 14.4 oz (84.3 kg)   SpO2 98%   BMI 30.46 kg/m  , BMI Body mass index is 30.46 kg/m. GENERAL:  Well appearing HEENT:  Pupils equal round and reactive, fundi not visualized, oral mucosa unremarkable NECK:  No jugular venous distention, waveform within normal limits, carotid upstroke brisk and symmetric, no bruits, no thyromegaly LUNGS:  Clear to auscultation bilaterally HEART:  RRR.  PMI not displaced or sustained,S1 and S2  within normal limits, no S3, no S4, no clicks, no rubs, no murmurs ABD:  Flat, positive bowel sounds normal in frequency in pitch, no bruits, no rebound, no guarding, no midline pulsatile mass, no hepatomegaly, no splenomegaly EXT:  2 plus pulses throughout, no edema, no cyanosis no clubbing SKIN:  No rashes no nodules NEURO:  Cranial nerves II through XII grossly intact, motor grossly intact throughout PSYCH:  Cognitively intact, oriented to person place and time  EKG:  EKG is personally reviewed.  09/21/2022: EKG was not ordered. 05/13/2022: Sinus rhythm.  Rate 69 bpm  Monitor 02/2022 [facility]): 1.  Normal sinus rhythm predominates recording 2.  PACs and PAC burst are noted.  None of these are sustained 3.  Sinus tachycardia is noted during daytime activity, sinus bradycardia is noted during evening hours 4.  No significant arrhythmias are noted otherwise.   Echo 05/26/2022:   IMPRESSIONS    1. Left ventricular ejection fraction, by estimation, is 60 to 65%. The  left ventricle has normal function. The left ventricle has no regional  wall motion abnormalities. There is mild concentric left ventricular  hypertrophy. Left ventricular diastolic  parameters are indeterminate, but likely with impaired relaxation.   2. Right ventricular systolic function is normal. The right ventricular  size is normal.   3. Left atrial size was moderately dilated.   4. The mitral valve is normal in structure. Trivial mitral valve  regurgitation.   5. The aortic valve is tricuspid. Aortic valve regurgitation is trivial.  No aortic stenosis is present.   6. The inferior vena cava is normal in size with greater than 50%  respiratory variability, suggesting right atrial pressure of 3 mmHg.   Echo 05/2021:  LVEF 60-65%  Exercise Myoview 07/11/2021: The left ventricular ejection fraction is normal (55-65%). Nuclear stress EF: 58%. There was no ST segment deviation noted during stress. No T wave  inversion was noted during stress. The study is normal. This is a low risk study.   1.  Normal myocardial perfusion imaging study without evidence of ischemia or infarction. 2.  Normal LVEF, 58%. 3.  Excellent functional capacity (10:31 min:s; 12.5 METS).  4.  Normal heart rate and blood pressure response to exercise. 5.  This  is a low risk study.  Recent Labs: No results found for requested labs within last 365 days.    Lipid Panel    Component Value Date/Time   CHOL 176 08/06/2021 0853   TRIG 63 08/06/2021 0853   HDL 42 08/06/2021 0853   CHOLHDL 4.2 08/06/2021 0853   CHOLHDL 4.1 01/05/2014 0823   VLDL 12 01/05/2014 0823   LDLCALC 122 (H) 08/06/2021 0853   LDLDIRECT 148.8 11/26/2008 1223      Wt Readings from Last 3 Encounters:  09/21/22 185 lb 14.4 oz (84.3 kg)  07/17/22 187 lb 1 oz (84.8 kg)  07/10/22 186 lb (84.4 kg)      ASSESSMENT AND PLAN:  Essential hypertension BP has been elevated home but not nearly as high as in the office.  She has white coat hypertension superimposed on hypertension.  Renin/aldo were indeterminate.  She had no adrenal adenomas on CT in 2021.  At this point she would need confirmatory testing.  This would require her to salt load and hold the valsartan.  I am worried that her blood pressure would get very high in the setting.  We will get an abdominal MRI to look for adrenal adenomas.  If none are present we will defer any further testing.  Exercise has been limited by her neck pain.  Continue to use best that she can.  Add spironolactone 12.5 mg daily and check a BMP in a week.   Current medicines are reviewed at length with the patient today.  The patient does not have concerns regarding medicines.  The following changes have been made:   Switch metoprolol to carvedilol  Labs/ tests ordered today include:   Orders Placed This Encounter  Procedures   MR ABDOMEN W CONTRAST   Basic metabolic panel    Disposition:    FU with Tiffany  C. Oval Linsey, MD, [facility] in 2 months.      I,Mathew Stumpf,acting as a Education administrator for Skeet Latch, MD.,have documented all relevant documentation on the behalf of Skeet Latch, MD,as directed by  Skeet Latch, MD while in the presence of Skeet Latch, MD.  I, Hawley Oval Linsey, MD have reviewed all documentation for this visit.  The documentation of the exam, diagnosis, procedures, and orders on 09/21/2022 are all accurate and complete.    Signed, Tiffany C. Oval Linsey, MD, [facility]  09/21/2022 5:05 PM    [place]

## 2022-09-28 ENCOUNTER — Telehealth: Payer: Self-pay | Admitting: Cardiovascular Disease

## 2022-09-28 NOTE — Telephone Encounter (Signed)
Pt c/o medication issue:  1. Name of Medication: spironolactone (ALDACTONE) 25 MG tablet  2. How are you currently taking this medication (dosage and times per day)? Half a tablet daily for 7 days  3. Are you having a reaction (difficulty breathing--STAT)? no  4. What is your medication issue? Patient states she thinks she was supposed to take the medication for 7 days and then have lab work. She says she is going to get the lab work, but is not sure if she is supposed to continue the medication.

## 2022-09-28 NOTE — Telephone Encounter (Signed)
Continue Spironolactone 12.'5mg'$  daily. The BMP is to ensure her kidneys and potassium are normal while taking the medicine.   Loel Dubonnet, NP

## 2022-09-28 NOTE — Telephone Encounter (Signed)
Returned call to patient and provided the following recommendations. Patient verbalizes understanding.    "Continue Spironolactone 12.'5mg'$  daily. The BMP is to ensure her kidneys and potassium are normal while taking the medicine.    Loel Dubonnet, NP "

## 2022-09-29 LAB — BASIC METABOLIC PANEL
BUN/Creatinine Ratio: 10 (ref 9–23)
BUN: 11 mg/dL (ref 6–24)
CO2: 25 mmol/L (ref 20–29)
Calcium: 9.4 mg/dL (ref 8.7–10.2)
Chloride: 107 mmol/L — ABNORMAL HIGH (ref 96–106)
Creatinine, Ser: 1.15 mg/dL — ABNORMAL HIGH (ref 0.57–1.00)
Glucose: 82 mg/dL (ref 70–99)
Potassium: 4.7 mmol/L (ref 3.5–5.2)
Sodium: 144 mmol/L (ref 134–144)
eGFR: 55 mL/min/{1.73_m2} — ABNORMAL LOW (ref >59)

## 2022-09-30 ENCOUNTER — Telehealth: Payer: Self-pay | Admitting: Cardiovascular Disease

## 2022-09-30 ENCOUNTER — Telehealth (HOSPITAL_BASED_OUTPATIENT_CLINIC_OR_DEPARTMENT_OTHER): Payer: Self-pay | Admitting: Cardiovascular Disease

## 2022-09-30 NOTE — Telephone Encounter (Signed)
I did not need this encounter. °

## 2022-09-30 NOTE — Telephone Encounter (Signed)
Pt c/o medication issue:  1. Name of Medication:   spironolactone (ALDACTONE) 25 MG tablet    2. How are you currently taking this medication (dosage and times per day)? 1/2 TABLET DAILY  3. Are you having a reaction (difficulty breathing--STAT)? No  4. What is your medication issue? Pt has question about medication.  Pt would also like a callback regarding labs. Please advise

## 2022-09-30 NOTE — Telephone Encounter (Signed)
Would be uncommon reaction to Spironolactone. Recommend she check BP once per day and contact us in a week with log of BP. If BP not at goal of <130/80 will need to consider alternate medical therapy.   Loel Dubonnet, NP

## 2022-10-01 ENCOUNTER — Other Ambulatory Visit (HOSPITAL_BASED_OUTPATIENT_CLINIC_OR_DEPARTMENT_OTHER): Payer: Self-pay | Admitting: *Deleted

## 2022-10-01 DIAGNOSIS — I10 Essential (primary) hypertension: Secondary | ICD-10-CM

## 2022-10-01 NOTE — Telephone Encounter (Signed)
September 30, 2022 Chapman Moss, RN to Me  Skeet Latch, MD      09/30/22 12:54 PM Pt requests Dr Oval Linsey address her symptoms since she is most familiar with her.

## 2022-10-01 NOTE — Progress Notes (Signed)
ref

## 2022-10-03 DIAGNOSIS — I10 Essential (primary) hypertension: Secondary | ICD-10-CM

## 2022-10-04 ENCOUNTER — Ambulatory Visit (HOSPITAL_COMMUNITY): Payer: BC Managed Care – PPO

## 2022-10-06 ENCOUNTER — Telehealth (HOSPITAL_BASED_OUTPATIENT_CLINIC_OR_DEPARTMENT_OTHER): Payer: Self-pay | Admitting: Cardiovascular Disease

## 2022-10-06 NOTE — Telephone Encounter (Signed)
Left message for patient to call and discuss rescheduling the MR Abdomen ordered by Dr. Anton 

## 2022-10-07 ENCOUNTER — Ambulatory Visit (INDEPENDENT_AMBULATORY_CARE_PROVIDER_SITE_OTHER): Payer: BC Managed Care – PPO | Admitting: Psychologist

## 2022-10-07 DIAGNOSIS — F411 Generalized anxiety disorder: Secondary | ICD-10-CM | POA: Diagnosis not present

## 2022-10-07 DIAGNOSIS — F330 Major depressive disorder, recurrent, mild: Secondary | ICD-10-CM

## 2022-10-07 NOTE — Progress Notes (Signed)
[place] Counselor/Therapist Progress Note  Patient ID: Heidi Herrera, MRN: [id number],    Date: 10/07/2022  Time Spent: 01:02 pm to 01:46 pm; total time: 44 minutes   This session was held via video webex teletherapy due to the coronavirus risk at this time. The patient consented to video teletherapy and was located at her home during this session. She is aware it is the responsibility of the patient to secure confidentiality on her end of the session. The provider was in a private home office for the duration of this session. Limits of confidentiality were discussed with the patient.   Treatment Type: Individual Therapy  Reported Symptoms: Depression and low self-worth  Mental Status Exam: Appearance:  Well Groomed     Behavior: Appropriate  Motor: Normal  Speech/Language:  Clear and Coherent  Affect: Appropriate  Mood: normal  Thought process: normal  Thought content:   WNL  Sensory/Perceptual disturbances:   WNL  Orientation: oriented to person, place, and time/date  Attention: Good  Concentration: Good  Memory: WNL  Fund of knowledge:  Good  Insight:   Fair  Judgment:  Good  Impulse Control: Good   Risk Assessment: Danger to Self:  No Self-injurious Behavior: No Danger to Others: No Duty to Warn:no Physical Aggression / Violence:No  Access to Firearms a concern: No  Gang Involvement:No   Subjective: Beginning the session, patient described herself as doing well while reviewing events since the last session. She talked about how she was able to avoid doing back surgery for the time being seeing a chiropractor. From there, she spent the session reflecting on a challenging relationship she maintains with her son. She processed thoughts and emotions. She was agreeable to homework and following up. She denied suicidal and homicidal ideation.   Interventions:  Worked on developing a therapeutic relationship with the patient using active listening and  reflective statements. Provided emotional support using empathy and validation. Reviewed the treatment plan with the patient. Reviewed events since the intake. Used summary and reflective statements. Processed some of the events since the last session. Praised the patient for doing well. Identified goals for the session. Explored the etiology of the conflict that she is experiencing with her son. Worked on asking clarification questions to better gain insight. Used socratic questions to assist the patient. Challenged some of the thoughts expressed. Explored rationale behind son's behaviors. Processed thoughts and emotions. Provided empathic statements. Assessed for suicidal and homicidal ideation.    Homework: Sit with the emotion of sadness  Next Session: Review homework. Process if patient wrote letter. Continue addressing concerns related to son. Emotional support  Diagnosis: F41.1 generalized anxiety disorder and F33.0 major depressive affective disorder, recurrent, mild   Plan:   Goals Alleviate depressive symptoms Recognize, accept, and cope with depressive feelings Develop healthy thinking patterns Develop healthy interpersonal relationships Reduce overall frequency, intensity, and duration of anxiety Stabilize anxiety level wile increasing ability to function Enhance ability to effectively cope with full variety of stressors Learn and implement coping skills that result in a reduction of anxiety   Objectives target date for all objectives is 6.30.2024 Verbalize an understanding of the cognitive, physiological, and behavioral components of anxiety Learning and implement calming skills to reduce overall anxiety Verbalize an understanding of the role that cognitive biases play in excessive irrational worry and persistent anxiety symptoms Identify, challenge, and replace based fearful talk Learn and implement problem solving strategies Identify and engage in pleasant  activities Learning and implement personal and  interpersonal skills to reduce anxiety and improve interpersonal relationships Learn to accept limitations in life and commit to tolerating, rather than avoiding, unpleasant emotions while accomplishing meaningful goals Identify major life conflicts from the past and present that form the basis for present anxiety Maintain involvement in work, family, and social activities Reestablish a consistent sleep-wake cycle Cooperate with a medical evaluation  Cooperate with a medication evaluation by a physician Verbalize an accurate understanding of depression Verbalize an understanding of the treatment Identify and replace thoughts that support depression Learn and implement behavioral strategies Verbalize an understanding and resolution of current interpersonal problems Learn and implement problem solving and decision making skills Learn and implement conflict resolution skills to resolve interpersonal problems Verbalize an understanding of healthy and unhealthy emotions verbalize insight into how past relationships may be influence current experiences with depression Use mindfulness and acceptance strategies and increase value based behavior  Increase hopeful statements about the future.  Interventions Engage the patient in behavioral activation Use instruction, modeling, and role-playing to build the client's general social, communication, and/or conflict resolution skills Use Acceptance and Commitment Therapy to help client accept uncomfortable realities in order to accomplish value-consistent goals Reinforce the client's insight into the role of his/her past emotional pain and present anxiety  Support the client in following through with work, family, and social activities Teach and implement sleep hygiene practices  Refer the patient to a physician for a psychotropic medication consultation Monito the clint's psychotropic medication  compliance Discuss how anxiety typically involves excessive worry, various bodily expressions of tension, and avoidance of what is threatening that interact to maintain the problem  Teach the patient relaxation skills Assign the patient homework Discuss examples demonstrating that unrealistic worry overestimates the probability of threats and underestimates patient's ability  Assist the patient in analyzing his or her worries Help patient understand that avoidance is reinforcing  Consistent with treatment model, discuss how change in cognitive, behavioral, and interpersonal can help client alleviate depression CBT Behavioral activation help the client explore the relationship, nature of the dispute,  Help the client develop new interpersonal skills and relationships Conduct Problem solving therapy Teach conflict resolution skills Use a process-experiential approach Conduct TLDP Conduct ACT Evaluate need for psychotropic medication Monitor adherence to medication   The patient and clinician reviewed the treatment plan on 7.6.2023. The patient approved of the treatment plan.   Conception Chancy, PsyD

## 2022-10-14 NOTE — Telephone Encounter (Signed)
Left message for patient to call and discuss rescheduling the MR Abdomen ordered by Dr. Oval Linsey

## 2022-10-14 NOTE — Telephone Encounter (Signed)
Patient returned my call about rescheduling--she states she has a high co payment and will call back at a later date to reschedule

## 2022-10-14 NOTE — Telephone Encounter (Signed)
Pt returning call regarding appt. Please advise

## 2022-10-14 NOTE — Telephone Encounter (Signed)
FYI

## 2022-10-19 NOTE — Telephone Encounter (Signed)
Skeet Latch, MD  to Chapman Moss, RN  Me     10/19/22  6:00 AM She can try increasing valsartan to 320 instead.  BMP in a week.  TCR  Left message to call back

## 2022-10-20 NOTE — Telephone Encounter (Signed)
Advised patient.  Stated she had taken the Valsartan 320 mg daily previously and caused bloating Blood pressure 145/97 this morning prior to medications She asked if she could take her Carvedilol and Valsartan both in the morning, advised ok  Also advised to take her Carvedilol 12 hours apart  Patient is going to monitor blood pressure x 1 week after making medication change and call back with readings.

## 2022-10-30 ENCOUNTER — Telehealth (HOSPITAL_BASED_OUTPATIENT_CLINIC_OR_DEPARTMENT_OTHER): Payer: Self-pay | Admitting: Cardiovascular Disease

## 2022-10-30 ENCOUNTER — Ambulatory Visit (INDEPENDENT_AMBULATORY_CARE_PROVIDER_SITE_OTHER): Payer: BC Managed Care – PPO | Admitting: Psychologist

## 2022-10-30 ENCOUNTER — Telehealth: Payer: Self-pay | Admitting: Cardiovascular Disease

## 2022-10-30 DIAGNOSIS — F330 Major depressive disorder, recurrent, mild: Secondary | ICD-10-CM | POA: Diagnosis not present

## 2022-10-30 DIAGNOSIS — F411 Generalized anxiety disorder: Secondary | ICD-10-CM

## 2022-10-30 DIAGNOSIS — E269 Hyperaldosteronism, unspecified: Secondary | ICD-10-CM

## 2022-10-30 DIAGNOSIS — I10 Essential (primary) hypertension: Secondary | ICD-10-CM

## 2022-10-30 NOTE — Telephone Encounter (Signed)
Patient states she is returning a phone calling from a previous phone call she had with Rip Harbour, she stated it in regards to her blood pressure.  Didn't give any further details.

## 2022-10-30 NOTE — Telephone Encounter (Signed)
Pt calling to give BP as recommended by Daphene Jaeger, RN:  155/103  Pt would also like to know if you see her readings in the app.

## 2022-10-30 NOTE — Progress Notes (Signed)
[facility] Counselor/Therapist Progress Note  Patient ID: Heidi Herrera, MRN: [id number],    Date: 10/30/2022  Time Spent: 01:04 pm to 01:42 pm; total time: 38 minutes   This session was held via video webex teletherapy due to the coronavirus risk at this time. The patient consented to video teletherapy and was located at her home during this session. She is aware it is the responsibility of the patient to secure confidentiality on her end of the session. The provider was in a private home office for the duration of this session. Limits of confidentiality were discussed with the patient.   Treatment Type: Individual Therapy  Reported Symptoms: Anxiety related to relationship with son  Mental Status Exam: Appearance:  Well Groomed     Behavior: Appropriate  Motor: Normal  Speech/Language:  Clear and Coherent  Affect: Appropriate  Mood: normal  Thought process: normal  Thought content:   WNL  Sensory/Perceptual disturbances:   WNL  Orientation: oriented to person, place, and time/date  Attention: Good  Concentration: Good  Memory: WNL  Fund of knowledge:  Good  Insight:   Fair  Judgment:  Good  Impulse Control: Good   Risk Assessment: Danger to Self:  No Self-injurious Behavior: No Danger to Others: No Duty to Warn:no Physical Aggression / Violence:No  Access to Firearms a concern: No  Gang Involvement:No   Subjective: Beginning the session, patient described herself as doing okay experiencing both good and bad days. Elaborating, patient stated that she wanted to be "comfortable" with emotions she experiences related to her son. She t hen spent most of the session reflecting on her frustrations with her son. Patient reflected on factors that may contribute to some of the challenges experienced. She acknowledged mixed emotions about having a relationship with her son versus not having one. She then ended the session indicating that she wants to explore what  brings her happiness. She was agreeable to homework and following up. She denied suicidal and homicidal ideation.   Interventions:  Worked on developing a therapeutic relationship with the patient using active listening and reflective statements. Provided emotional support using empathy and validation. Reviewed the treatment plan with the patient. Used summary and reflective statements. Normalized and validated expressed thoughts and emotions. Praised patient for completing the homework assignment. Identified goals for the session. Used socratic questions to assist the patient. Explored etiology behind some of the decisions that the son makes. Pointed out inconsistencies expressed by the patient. Explored the mixed emotional response to having a relationship versus not having a relationship with her son. Challenged some of the thoughts expressed. Attempted to assist the patient gain insight. Processed thoughts and emotions. Provided empathic statements. Assessed for suicidal and homicidal ideation.    Homework: Reflect on what brings the patient happiness  Next Session: Review homework and emotional support.   Diagnosis: F41.1 generalized anxiety disorder and F33.0 major depressive affective disorder, recurrent, mild   Plan:   Goals Alleviate depressive symptoms Recognize, accept, and cope with depressive feelings Develop healthy thinking patterns Develop healthy interpersonal relationships Reduce overall frequency, intensity, and duration of anxiety Stabilize anxiety level wile increasing ability to function Enhance ability to effectively cope with full variety of stressors Learn and implement coping skills that result in a reduction of anxiety   Objectives target date for all objectives is 6.30.2024 Verbalize an understanding of the cognitive, physiological, and behavioral components of anxiety Learning and implement calming skills to reduce overall anxiety Verbalize an understanding of  the  role that cognitive biases play in excessive irrational worry and persistent anxiety symptoms Identify, challenge, and replace based fearful talk Learn and implement problem solving strategies Identify and engage in pleasant activities Learning and implement personal and interpersonal skills to reduce anxiety and improve interpersonal relationships Learn to accept limitations in life and commit to tolerating, rather than avoiding, unpleasant emotions while accomplishing meaningful goals Identify major life conflicts from the past and present that form the basis for present anxiety Maintain involvement in work, family, and social activities Reestablish a consistent sleep-wake cycle Cooperate with a medical evaluation  Cooperate with a medication evaluation by a physician Verbalize an accurate understanding of depression Verbalize an understanding of the treatment Identify and replace thoughts that support depression Learn and implement behavioral strategies Verbalize an understanding and resolution of current interpersonal problems Learn and implement problem solving and decision making skills Learn and implement conflict resolution skills to resolve interpersonal problems Verbalize an understanding of healthy and unhealthy emotions verbalize insight into how past relationships may be influence current experiences with depression Use mindfulness and acceptance strategies and increase value based behavior  Increase hopeful statements about the future.  Interventions Engage the patient in behavioral activation Use instruction, modeling, and role-playing to build the client's general social, communication, and/or conflict resolution skills Use Acceptance and Commitment Therapy to help client accept uncomfortable realities in order to accomplish value-consistent goals Reinforce the client's insight into the role of his/her past emotional pain and present anxiety  Support the client in  following through with work, family, and social activities Teach and implement sleep hygiene practices  Refer the patient to a physician for a psychotropic medication consultation Monito the clint's psychotropic medication compliance Discuss how anxiety typically involves excessive worry, various bodily expressions of tension, and avoidance of what is threatening that interact to maintain the problem  Teach the patient relaxation skills Assign the patient homework Discuss examples demonstrating that unrealistic worry overestimates the probability of threats and underestimates patient's ability  Assist the patient in analyzing his or her worries Help patient understand that avoidance is reinforcing  Consistent with treatment model, discuss how change in cognitive, behavioral, and interpersonal can help client alleviate depression CBT Behavioral activation help the client explore the relationship, nature of the dispute,  Help the client develop new interpersonal skills and relationships Conduct Problem solving therapy Teach conflict resolution skills Use a process-experiential approach Conduct TLDP Conduct ACT Evaluate need for psychotropic medication Monitor adherence to medication   The patient and clinician reviewed the treatment plan on 7.6.2023. The patient approved of the treatment plan.   Conception Chancy, PsyD

## 2022-10-30 NOTE — Telephone Encounter (Signed)
Patient called in with this reading. Her other readings are in vivify. Patient was told to follow up with her blood pressures. Please review vivify readings and advise for medication changes if needed.

## 2022-10-30 NOTE — Telephone Encounter (Signed)
Patient called requesting we send an order for the MR Adbomen w/wo contrast to [facility].

## 2022-10-30 NOTE — Telephone Encounter (Signed)
Left message for patient to call back. Advised patient to give BP log to phone operators when she calls back and I will get them reviewed and call her back if we need to make any changes.

## 2022-11-01 NOTE — Telephone Encounter (Signed)
Pt previously requested advice from Dr. Oval Linsey only.   09/30/22 noting elevated BP, bloating, edema (no weight gain) after starting Spironolactone 12.'5mg'$  daily. Valsartan increased to '320mg'$  QD. She was recommended for BMP in 1 week which has not yet been performed.   On review in [place], BP most often 130s-140s with occasional spike to 150s-160s. HR 50s-60s bpm.   Can we please get an update on how she is feeling? And ask her to have repeat BMP this week? TY!  Loel Dubonnet, NP

## 2022-11-02 DIAGNOSIS — I10 Essential (primary) hypertension: Secondary | ICD-10-CM

## 2022-11-02 NOTE — Telephone Encounter (Signed)
Left message with instructions to have repeat lab work drawn.       "Pt previously requested advice from Dr. Oval Linsey only.    09/30/22 noting elevated BP, bloating, edema (no weight gain) after starting Spironolactone 12.'5mg'$  daily. Valsartan increased to '320mg'$  QD. She was recommended for BMP in 1 week which has not yet been performed.    On review in [place], BP most often 130s-140s with occasional spike to 150s-160s. HR 50s-60s bpm.    Can we please get an update on how she is feeling? And ask her to have repeat BMP this week? TY!   Loel Dubonnet, NP"

## 2022-11-03 NOTE — Telephone Encounter (Signed)
Left detailed message order faxed as requested, confirmation received

## 2022-11-13 ENCOUNTER — Encounter: Payer: Self-pay | Admitting: Dietician

## 2022-11-13 ENCOUNTER — Encounter: Payer: BC Managed Care – PPO | Attending: Cardiovascular Disease | Admitting: Dietician

## 2022-11-13 DIAGNOSIS — N189 Chronic kidney disease, unspecified: Secondary | ICD-10-CM | POA: Diagnosis present

## 2022-11-13 NOTE — Progress Notes (Signed)
Medical Nutrition Therapy  Appointment Start time:  4356  [id number] End time:  1440 Pt is here today alone.  She was last seen by myself 07/10/2022.  She states that she does not have enough energy when she eats too strictly. States that she gets lightheaded when she eats too much protein. States that her blood pressure is still elevated. Likely is too restrictive. Weight is stable.  Primary concerns today: Referral diagnosis: Essential HTN, GERD, kidney failure Preferred learning style: no preference indicated Learning readiness: change in progress  NUTRITION ASSESSMENT   Anthropometrics  186 lbs 11/13/2022.  Feels that she has water weight 186 lb She states her weight has been stable recently.  Noted 183 lbs 06/18/2022. 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 She states that sometimes her joints ache when she eats but isn't sure what causes it. She notices it more with chicken, eggs, and sugary foods. She does not eat beef or pork. She tries to eat mostly plant-based but does not feel like she is getting enough nutrition.  Food Allergies (per skin prick test per patient): gluten (bloat, fullness), corn (she does not note symptoms), peanuts (itches), aspartame (frequent urination), thinks she is allergic to avocado's Too much rice causes blurry vision Likely lactose intolerant  Estimated daily fluid intake: 48 oz water, 16oz tea Supplements: Vitamin B12, MVI, Iron at times (causes constipation), Calcium+Vitamin D, zinc, hair/skin/nails, CoQ10 Sleep: 7 hours with interruptions to use the  restroom. Only feels well-rested sometimes. Goes to bed 10:30/11pm. Stress / self-care: feels that her stress is medium/high Current average weekly physical activity: Has been exercising less since she feels like she is not eating enough. Has a stationary bike and rides for 15 minutes 2x/week. She also goes walking around stores.   24-Hr Dietary Recall First Meal: Fruit salad (blueberries, strawberries, peach) with almond yogurt and nuts OR gluten free bread and vegetarian sausage OR cheese or grilled chicken biscuit from [employer] OR chickpea scramble OR GF toast, vegan cream cheese, peppers, pear Snack: none OR trail mix Second Meal: homemade vegetable soup, stir fried green beans Snack: fruit (plums) OR nuts OR homemade walnut butter with GF bread Third Meal: pinto beans OR salmon patty sandwich Snack: none OR nuts OR occasional plantains OR non-dairy [employer]  Beverages: water, ginger tea  Estimated Energy Needs Calories: 2000  Protein: 55-66 g   NUTRITION DIAGNOSIS  NB-1.1 Food and nutrition-related knowledge deficit As related to multiple food allergies, CKD and other concerns.  As evidenced by diet hx and patient report.   NUTRITION INTERVENTION  Nutrition education (E-1) on the following topics:  Simple meal ideas to creat better balance Importance of adequate nutrition for fullness, to meet nutrient needs, and improve energy Resources for recipes Discussion of her food allergies and intolerances Grain sources that do not contain gluten/wheat  Handouts Provided Include (initial visit) NKD national kidney diet - Dish up a Kidney-Friendly Meal for Patients with Chronic Kidney Disease (not on dialysis)  Low Carbohydrate Snack Suggestions ACLM ([employer] Lifestyle Medicine) packet  Learning Style & Readiness for Change Teaching method utilized: Visual & Auditory  Demonstrated degree  of understanding via: Teach Back  Barriers to learning/adherence to  lifestyle change: stress, increased health concerns and food intolerances  Goals Established by Pt Continue low sodium whole foods plant based  diet Simple meal planning Consider ways to be more active  You are considering a physical therapist/ trainer.    Consider [facility] [place]org to help get more ideas and balance your meals. Consider other options for filling breakfast meals.  Look for vegan cook books in [employer]. Each meal include beans, nuts, tofu, or a vegetarian meat alternative along with unprocessed carbohydrate, fruit  Try to balance out your meals in order to feel full and energized             -Include a protein: tofu, beans, 3oz fish, nuts/seeds, hummus   Consider eating 2 Tablespoons ground flaxseeds daily             -Start slow, 1 Tablespoon, then advance to 2.              -Try adding to your yogurt, a smoothie, or salad dressing             -This may help with constipation/blood pressure   For a savory/warm breakfast dish look up a "tofu scramble" recipe             -saute veggies and/or add beans             -add into a gluten free tortilla or on top of gluten free toast             -avoid adding salt- try garlic, low sodium broth, turmeric             -nutritional yeast can add a cheesy flavor   For a sweet/warm breakfast try oatmeal cooked in almond milk. Add chopped dried figs and nuts or nut butter.    Options for gluten-free carb choices:             -quinoa, brown rice, gluten free tortillas   Resources:             -Delsa Bern             -PB with J (YouTube)             -Dondra Prader (YouTube)             -Nicola Girt (Cookbook & YouTube)  -[employer] cookbook or .com  MONITORING & EVALUATION Dietary intake, weekly physical activity, and follow-up in 2-3 months.  Next Steps  Patient is to call for questions.

## 2022-11-13 NOTE — Patient Instructions (Addendum)
You are considering a physical therapist/ trainer.    Consider [facility] [email or web address] to help get more ideas and balance your meals. Consider other options for filling breakfast meals.  Look for vegan cook books in [employer]. Each meal include beans, nuts, tofu, or a vegetarian meat alternative along with unprocessed carbohydrate, fruit  Try to balance out your meals in order to feel full and energized             -Include a protein: tofu, beans, 3oz fish, nuts/seeds, hummus   Consider eating 2 Tablespoons ground flaxseeds daily             -Start slow, 1 Tablespoon, then advance to 2.              -Try adding to your yogurt, a smoothie, or salad dressing             -This may help with constipation/blood pressure   For a savory/warm breakfast dish look up a "tofu scramble" recipe             -saute veggies and/or add beans             -add into a gluten free tortilla or on top of gluten free toast             -avoid adding salt- try garlic, low sodium broth, turmeric             -nutritional yeast can add a cheesy flavor   For a sweet/warm breakfast try oatmeal cooked in almond milk. Add chopped dried figs and nuts or nut butter.    Options for gluten-free carb choices:             -quinoa, brown rice, gluten free tortillas   Resources:             -Delsa Bern             -PB with J (YouTube)             -Dondra Prader (YouTube)             -Nicola Girt (Cookbook & YouTube)  -[employer] cookbook or .com

## 2022-11-16 ENCOUNTER — Telehealth: Payer: Self-pay | Admitting: Cardiovascular Disease

## 2022-11-16 NOTE — Telephone Encounter (Signed)
Spoke with Osborne [place] but person spoke with only does scheduling Will call back tomorrow, [phone]

## 2022-11-16 NOTE — Telephone Encounter (Signed)
Pt has an appt for MRI on 11/18/22 at 8:30am however [facility] states that authorization has never been done.    NPI for [facility]: [id number]

## 2022-11-16 NOTE — Telephone Encounter (Addendum)
Received message from Hilbert Corrigan that MRI has been approved  Will call [facility] tomorrow to relay information, after

## 2022-11-16 NOTE — Telephone Encounter (Signed)
Will forward to precert pool

## 2022-11-17 ENCOUNTER — Telehealth (HOSPITAL_BASED_OUTPATIENT_CLINIC_OR_DEPARTMENT_OTHER): Payer: Self-pay | Admitting: Cardiovascular Disease

## 2022-11-17 ENCOUNTER — Telehealth: Payer: Self-pay | Admitting: Cardiovascular Disease

## 2022-11-17 NOTE — Telephone Encounter (Signed)
Spoke with patient to discuss rescheduling the MR Abdomen w contrast that was cancelled in October---patient states it is scheduled Wednesday 11/18/22 at [facility]

## 2022-11-17 NOTE — Telephone Encounter (Signed)
Spoke with [facility] and PA received

## 2022-11-17 NOTE — Telephone Encounter (Signed)
Faxed and confirmed with [facility] received

## 2022-11-17 NOTE — Telephone Encounter (Signed)
[facility] called stating they received an order for an MRI Abdomen with contrast for this patient.  It needs to be MRI abdomen w/wo contrast.  It can be faxed to [phone].  Patient is coming in tomorrow for the MRI.

## 2022-11-19 NOTE — Telephone Encounter (Signed)
Patient returned call to the office to discuss the labs we had asked her to get.   Patient states she never adjusted her meds according to the VM that was left for her. Patient is not taking Spironolactone, and is taking Valsartan '160mg'$  daily along with Carvedilol 12.5 BID. Since patient did not change her meds she does not feel that new labs are necessary. Instructed patient that medications and need for labs will be reviewed at upcoming appointment. Patient also wishes for things not to be mailed to her any further due to the [place] office mixing up her mail with her neighbors. Of note her recent MRI are under the media tab from [facility].

## 2022-11-19 NOTE — Telephone Encounter (Signed)
Please call patient to remind her to have her BMP done prior to OV with Dr. Oval Linsey 11/25/22 so results are available at time of OV. TY!  Loel Dubonnet, NP

## 2022-11-19 NOTE — Telephone Encounter (Signed)
Contacted patient for lab reminder at request of NP, no answer, left message with reminder

## 2022-11-25 ENCOUNTER — Ambulatory Visit (HOSPITAL_BASED_OUTPATIENT_CLINIC_OR_DEPARTMENT_OTHER): Payer: BC Managed Care – PPO | Admitting: Cardiovascular Disease

## 2022-11-25 ENCOUNTER — Encounter (HOSPITAL_BASED_OUTPATIENT_CLINIC_OR_DEPARTMENT_OTHER): Payer: Self-pay | Admitting: Cardiovascular Disease

## 2022-11-25 VITALS — BP 198/102 | HR 60 | Ht 65.5 in | Wt 184.4 lb

## 2022-11-25 DIAGNOSIS — Q613 Polycystic kidney, unspecified: Secondary | ICD-10-CM

## 2022-11-25 DIAGNOSIS — I10 Essential (primary) hypertension: Secondary | ICD-10-CM | POA: Diagnosis not present

## 2022-11-25 DIAGNOSIS — Z006 Encounter for examination for normal comparison and control in clinical research program: Secondary | ICD-10-CM

## 2022-11-25 MED ORDER — VALSARTAN 160 MG PO TABS: ORAL_TABLET | ORAL | 3 refills | Status: DC

## 2022-11-25 MED ORDER — VALSARTAN 80 MG PO TABS: ORAL_TABLET | ORAL | 3 refills | Status: DC

## 2022-11-25 NOTE — Assessment & Plan Note (Signed)
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  She reports that this was first diagnosed in 2019 and that she does have a nephrologist.  Encouraged her to keep her fluids to at least 3 L daily.  Continue her ARB.  She does have a son and recommended that he be screened as well.  This makes it even more important to control her blood pressure.

## 2022-11-25 NOTE — Assessment & Plan Note (Signed)
Her blood pressure remains uncontrolled, though it is higher in the office than it is at home.  She continues to participate in a research study and to log her blood pressures in our [place] remote patient monitoring system.  She is struggled with intolerance to multiple medications.  Recommended trying indapamide.  However she would prefer to increase her valsartan.  She thinks taking the full dose is too strong in the morning and then it wears off before her next dose.  We will try increasing to 120 mg twice daily.  Continue carvedilol.  Encouraged her to exercise at least 150 minutes weekly and limit sodium intake.

## 2022-11-25 NOTE — Progress Notes (Signed)
Advanced Hypertension Clinic Follow-up:  Date:  11/25/2022   ID:  Heidi Herrera, DOB 24-Aug-1966, MRN [id number]  PCP:  Pcp, No  Cardiologist:   Skeet Latch, MD   No chief complaint on file.   History of Present Illness: Heidi Herrera is a 56 y.o. female with hypertension, aortic atherosclerosis, non-obstructive CAD, CKD 3, and hyperlipidemia here for follow-up. She was initially seen 05/13/2022 to establish care.  She last saw Dr. Lennox Pippins 07/2021 and complained of frequent urination.  Lisinopril was increased while hydrochlorothiazide was reduced.  At that visit her blood pressure was 138/84.  She previously had a nuclear stress test 06/2021 that revealed LVEF 58% with no ischemia.  She saw her PCP 01/2022 and was no longer on HCTZ.  She had also self discontinued lisinopril and her blood pressure was 172/108.  It was recommended that she resume the lisinopril.  She followed up with the urologist and they started her on Vesicare for urinary frequency.    She saw a cardiologist at [facility] 01/2022 and reported palpitations.  She wore a 30-day event recorder which showed sinus tachycardia but no other significant arrhythmias.  Her BP was 168/98 and 154/84.  At the time she was only taking lisinopril.  She reported palpitations and had an event recorder that revealed sinus tachycardia in the middle of the night up to 140 bpm.  She was started on metoprolol succinate but still had some palpitations. Metoprolol was switched to carvedilol. She was enrolled in the remote patient monitoring study. She called the office stating that carvedilol was causing her to have palpitations. She also had concerns with her memory, and was advised to discuss with hear PCP. She saw Nehemiah Massed, PharmD 07/2022 and noted a burning pain in her liver an hour after taking lisinopril. She was switched to valsartan. It was noted that her blood pressure readings in the office were much higher than in [place].   She had an  MRI done for her cervical spine. She was told that she might need surgery but she has been looking into alternative options. She met with a chiropractor. They told her that her spine pressing on her nerves could be the reason of her vision, blood pressure, and digestive issues. The pain on her back and neck feels like a pressure weighing her down and she hopes that by going along with this chiropractic treatment she could resolve her other issues. At the last visit her blood pressure was higher in the office than on home monitoring. Her renin and aldosterone levels were indeterminate, and spironolactone was added. She called the office reporting side effects of spironolactone, and valsartan was increased. Blood pressures in [place] for the last 2 weeks have ranged from the 120s-160s over 80s-100s. It has mostly been around 140/80s-90s. She had an MRI at [facility] that revealed innumerable cysts in bilateral kidneys, suggestive of polycystic kidney disease and a likely horseshoe kidney. She also had multiple hepatic cysts and uterine fibroids. She had no adrenal adenomas.  Today, she is feeling alright aside from her varying blood pressures. At home her blood pressure ranges 120s-140s, but is mostly in the [id number] systolic. In clinic her blood pressure is 163/81 (198/102 on recheck). In the mornings, her blood pressure is a little higher than in the evenings. Usually she takes her valsartan and carvedilol 1-2 hours apart in the mornings. If she takes them at the same time she develops side effects including visual disturbances. She has also noticed harder  heart beats, which she thinks is related to only taking a dose of carvedilol in the evening without valsartan. Her palpitations are often occurring around 4 AM. While taking spironolactone she developed urinary hesitancy. Also she complains of bloating and leg tightness on valsartan 160 mg; she would be wary of increasing this dose. She also feels very bloated  sometimes after eating. She wonders if she may have GI absorption issues. Of note she doesn't need to eat large portions before satiety. Additionally she complains of ongoing neck issues. She may not always feel neck pain, but she is usually aware of associated pressure. Worsening neck pressure has also correlated with double vision. She has seen a chiropractor and reports improvement in her lumbar spinal alignment, but she believes she may still have nerve problems. She denies any chest pain, shortness of breath, or peripheral edema. No lightheadedness, headaches, syncope, orthopnea, or PND.  Prior Antihypertensives: Irbesartan Spironolactone - urinary hesitancy  Past Medical History:  Diagnosis Date   Acid reflux    Allergy    Anemia    Anxiety    Fibroids    GERD (gastroesophageal reflux disease)    H. pylori infection    Hypertension    Kidney failure    stage 2   Migraine    Obesity    Polycystic kidney disease    Sebaceous cyst    Sessile colonic polyp    Vertigo     Past Surgical History:  Procedure Laterality Date   IR GENERIC HISTORICAL  12/01/2016   IR ANGIOGRAM PELVIS SELECTIVE OR SUPRASELECTIVE 12/01/2016 Sandi Mariscal, MD [facility]   IR GENERIC HISTORICAL  12/01/2016   IR US GUIDE VASC ACCESS RIGHT 12/01/2016 Sandi Mariscal, MD [facility]   IR GENERIC HISTORICAL  12/01/2016   IR EMBO ARTERIAL NOT HEMORR HEMANG INC GUIDE ROADMAPPING 12/01/2016 Sandi Mariscal, MD [facility]   IR GENERIC HISTORICAL  12/01/2016   IR ANGIOGRAM SELECTIVE EACH ADDITIONAL VESSEL 12/01/2016 Sandi Mariscal, MD [facility]   IR GENERIC HISTORICAL  12/01/2016   IR ANGIOGRAM SELECTIVE EACH ADDITIONAL VESSEL 12/01/2016 Sandi Mariscal, MD [facility]   IR GENERIC HISTORICAL  12/01/2016   IR ANGIOGRAM PELVIS SELECTIVE OR SUPRASELECTIVE 12/01/2016 Sandi Mariscal, MD [facility]   IR GENERIC HISTORICAL  01/07/2017   IR RADIOLOGIST EVAL & MGMT 01/07/2017 Sandi Mariscal, MD [facility]   IR RADIOLOGIST  EVAL & MGMT  08/31/2017   LOBECTOMY  2009   MYOMECTOMY  2007   UTERINE FIBROID EMBOLIZATION     12/01/2016     Current Outpatient Medications  Medication Sig Dispense Refill   Bacillus Coagulans-Inulin (ALIGN PREBIOTIC-PROBIOTIC PO) Take by mouth.     calcium-vitamin D (OSCAL WITH D) 500-200 MG-UNIT per tablet Take 1 tablet by mouth 2 (two) times daily. Calcium 1000 mg with Magnesium 400 mg and zinc 15 mg, taking 1 capsule 2 times daily     carvedilol (COREG) 12.5 MG tablet Take 12.5 mg by mouth 2 (two) times daily with a meal.     Cholecalciferol (VITAMIN D3) 125 MCG (5000 UT) CAPS Take 1 capsule by mouth daily.     Cyanocobalamin (VITAMIN B 12 PO) Take 1 tablet by mouth daily.     famotidine (PEPCID) 20 MG tablet Take 20 mg by mouth 2 (two) times daily.     FERROUS FUMARATE PO Take 15 mLs by mouth daily.     Multiple Vitamin (MULTIVITAMIN WITH MINERALS) TABS tablet Take 1 tablet by mouth daily.  solifenacin (VESICARE) 5 MG tablet Take 5 mg by mouth daily as needed.     valsartan (DIOVAN) 160 MG tablet Take 160 mg by mouth as directed. Take 1/2 tablet twice a day along with the 80 mg 1/2 tablet for total of 120 mg twice a day     valsartan (DIOVAN) 80 MG tablet 1/2 TABLET TWICE A DAY. TO BE TAKEN WITH THE 1/2 160 MG TABLETS 90 tablet 3   No current facility-administered medications for this visit.    Allergies:   Bystolic [nebivolol hcl], Amlodipine, Biaxin [clarithromycin], Coreg [carvedilol], Eplerenone, Hydralazine, Peanut-containing drug products, Aspartame, and Erythromycin base    Social History:  The patient  reports that she has never smoked. She has never used smokeless tobacco. She reports that she does not currently use alcohol after a past usage of about 1.0 standard drink of alcohol per week. She reports that she does not use drugs.   Family History:  The patient's family history includes Cancer in her brother; Cancer (age of onset: 41) in her sister; Diabetes in her  mother, sister, and sister; Esophageal cancer in her maternal grandfather; Glaucoma in her brother, maternal uncle, and sister; Heart attack in her sister; Heart disease in her maternal uncle; Hyperlipidemia in her sister; Hypertension in her brother, father, mother, sister, and sister; Kidney Stones in her mother; Mental illness in her father, sister, and sister; Obesity in her brother, mother, and sister; Pancreatic cancer in her cousin; Stroke in her brother, brother, and mother.    ROS:   Please see the history of present illness.    (+) Tightness of BLE (+) Bloating (+) Neck pain, pressure (+) Double vision (+) Palpitations All other systems are reviewed and negative.    PHYSICAL EXAM: VS:  BP (!) 198/102 (BP Location: Left Arm, Patient Position: Sitting, Cuff Size: Normal)   Pulse 60   Ht 5' 5.5" (1.664 m)   Wt 184 lb 6.4 oz (83.6 kg)   SpO2 100%   BMI 30.22 kg/m  , BMI Body mass index is 30.22 kg/m. GENERAL:  Well appearing HEENT:  Pupils equal round and reactive, fundi not visualized, oral mucosa unremarkable NECK:  No jugular venous distention, waveform within normal limits, carotid upstroke brisk and symmetric, no bruits, no thyromegaly LUNGS:  Clear to auscultation bilaterally HEART:  RRR.  PMI not displaced or sustained,S1 and S2 within normal limits, no S3, no S4, no clicks, no rubs, no murmurs ABD:  Flat, positive bowel sounds normal in frequency in pitch, no bruits, no rebound, no guarding, no midline pulsatile mass, no hepatomegaly, no splenomegaly EXT:  2 plus pulses throughout, no edema, no cyanosis no clubbing SKIN:  No rashes no nodules NEURO:  Cranial nerves II through XII grossly intact, motor grossly intact throughout PSYCH:  Cognitively intact, oriented to person place and time  EKG:  EKG is personally reviewed.  11/25/2022:  EKG was not ordered. 09/21/2022: EKG was not ordered. 05/13/2022: Sinus rhythm.  Rate 69 bpm  MRI Abdomen  11/18/2022  [facility]): IMPRESSION:  1. Enlarged bilateral kidneys with innumerable cysts essentially replacing the renal parenchyma suggesting polycystic kidney disease. Suggestion of a horseshoe kidney.  2.  Multiple subcentimeter hepatic cysts.  3.  Gallbladder sludge.  4.  Uterine fibroids.  5.  Mild pelvic fluid.   Monitor 02/2022 ([place]): 1.  Normal sinus rhythm predominates recording 2.  PACs and PAC burst are noted.  None of these are sustained 3.  Sinus tachycardia is  noted during daytime activity, sinus bradycardia is noted during evening hours 4.  No significant arrhythmias are noted otherwise.   Echo 05/26/2022:  IMPRESSIONS   1. Left ventricular ejection fraction, by estimation, is 60 to 65%. The  left ventricle has normal function. The left ventricle has no regional  wall motion abnormalities. There is mild concentric left ventricular  hypertrophy. Left ventricular diastolic  parameters are indeterminate, but likely with impaired relaxation.   2. Right ventricular systolic function is normal. The right ventricular  size is normal.   3. Left atrial size was moderately dilated.   4. The mitral valve is normal in structure. Trivial mitral valve  regurgitation.   5. The aortic valve is tricuspid. Aortic valve regurgitation is trivial.  No aortic stenosis is present.   6. The inferior vena cava is normal in size with greater than 50%  respiratory variability, suggesting right atrial pressure of 3 mmHg.   Echo 05/2021:  LVEF 60-65%  Exercise Myoview 07/11/2021: The left ventricular ejection fraction is normal (55-65%). Nuclear stress EF: 58%. There was no ST segment deviation noted during stress. No T wave inversion was noted during stress. The study is normal. This is a low risk study.   1.  Normal myocardial perfusion imaging study without evidence of ischemia or infarction. 2.  Normal LVEF, 58%. 3.  Excellent functional capacity (10:31 min:s; 12.5 METS).  4.  Normal  heart rate and blood pressure response to exercise. 5.  This is a low risk study.  Recent Labs: 09/28/2022: BUN 11; Creatinine, Ser 1.15; Potassium 4.7; Sodium 144    Lipid Panel    Component Value Date/Time   CHOL 176 08/06/2021 0853   TRIG 63 08/06/2021 0853   HDL 42 08/06/2021 0853   CHOLHDL 4.2 08/06/2021 0853   CHOLHDL 4.1 01/05/2014 0823   VLDL 12 01/05/2014 0823   LDLCALC 122 (H) 08/06/2021 0853   LDLDIRECT 148.8 11/26/2008 1223      Wt Readings from Last 3 Encounters:  11/25/22 184 lb 6.4 oz (83.6 kg)  09/21/22 185 lb 14.4 oz (84.3 kg)  07/17/22 187 lb 1 oz (84.8 kg)      ASSESSMENT AND PLAN:  Essential hypertension Her blood pressure remains uncontrolled, though it is higher in the office than it is at home.  She continues to participate in a research study and to log her blood pressures in our [place] remote patient monitoring system.  She is struggled with intolerance to multiple medications.  Recommended trying indapamide.  However she would prefer to increase her valsartan.  She thinks taking the full dose is too strong in the morning and then it wears off before her next dose.  We will try increasing to 120 mg twice daily.  Continue carvedilol.  Encouraged her to exercise at least 150 minutes weekly and limit sodium intake.  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  She reports that this was first diagnosed in 2019 and that she does have a nephrologist.  Encouraged her to keep her fluids to at least 3 L daily.  Continue her ARB.  She does have a son and recommended that he be screened as well.  This makes it even more important to control her blood pressure.   Disposition:    FU with Yunique Dearcos C. Oval Linsey, MD, [facility] in 2 months.    Medication Adjustments/Labs and Tests Ordered: Current medicines are reviewed at length with the patient today.  Concerns regarding  medicines are outlined above.   No  orders of the defined types were placed in this encounter.  Meds ordered this encounter  Medications   DISCONTD: valsartan (DIOVAN) 160 MG tablet    Sig: 1/2 TABLET TWICE A DAY. TO BE TAKEN WITH THE 1/2 80 MG TABLETS    Dispense:  90 tablet    Refill:  3   valsartan (DIOVAN) 80 MG tablet    Sig: 1/2 TABLET TWICE A DAY. TO BE TAKEN WITH THE 1/2 160 MG TABLETS    Dispense:  90 tablet    Refill:  3    I,Mathew Stumpf,acting as a scribe for Skeet Latch, MD.,have documented all relevant documentation on the behalf of Skeet Latch, MD,as directed by  Skeet Latch, MD while in the presence of Skeet Latch, MD.  I, Harcourt Oval Linsey, MD have reviewed all documentation for this visit.  The documentation of the exam, diagnosis, procedures, and orders on 11/25/2022 are all accurate and complete.  Signed, Jimmie Rueter C. Oval Linsey, MD, [facility]  11/25/2022 2:11 PM    [facility]

## 2022-11-25 NOTE — Patient Instructions (Signed)
Medication Instructions:  INCREASE YOUR VALSARTAN TO 120 MG TWICE A DAY  TAKE 1/2 OF THE 160 MG AND 1/2 OF THE 80 MG TABLET   Labwork: BMET 1 WEEK   Testing/Procedures: NONE  Follow-Up: 02/01/2023 11:00 AM

## 2022-11-25 NOTE — Research (Signed)
I saw pt today after Dr. [place]'s follow up visit. Pt is in Dr. Millbourne's Virtual Care HTN Study. Pt filled out research survey. Pt is enrolled in Group 1.  

## 2022-11-26 ENCOUNTER — Telehealth (HOSPITAL_BASED_OUTPATIENT_CLINIC_OR_DEPARTMENT_OTHER): Payer: Self-pay | Admitting: Cardiovascular Disease

## 2022-11-26 ENCOUNTER — Telehealth: Payer: Self-pay | Admitting: Cardiovascular Disease

## 2022-11-26 ENCOUNTER — Ambulatory Visit: Payer: BC Managed Care – PPO | Admitting: Psychologist

## 2022-11-26 MED ORDER — VALSARTAN 40 MG PO TABS: 40.0000 mg | ORAL_TABLET | Freq: Every day | ORAL | 5 refills | Status: DC

## 2022-11-26 MED ORDER — VALSARTAN 80 MG PO TABS: 80.0000 mg | ORAL_TABLET | Freq: Every day | ORAL | 3 refills | Status: DC

## 2022-11-26 NOTE — Telephone Encounter (Addendum)
Discussed with Dr Oval Linsey and will just have patient take Valsartan 80 mg and 40 mg twice a day together   Advised patient, verbalized understanding

## 2022-11-26 NOTE — Telephone Encounter (Signed)
Here ya go!  

## 2022-11-26 NOTE — Telephone Encounter (Signed)
  Pt c/o medication issue:  1. Name of Medication: valsartan (DIOVAN) 160 MG tablet  valsartan (DIOVAN) 80 MG tablet   2. How are you currently taking this medication (dosage and times per day)? As written   3. Are you having a reaction (difficulty breathing--STAT)? No   4. What is your medication issue?   Pt is requesting to speak with [facility]. She said, she tried to pick up this medication at the pharmacy and was told they don't recommend to break this medication in half since it is not manufactured to do that and there's a chance if the medication break in half the pt will not get the right dosage. Pt said, to call the pharmacy if there in question

## 2022-11-26 NOTE — Telephone Encounter (Signed)
Pt c/o medication issue:  1. Name of Medication:   valsartan (DIOVAN) 40 MG tablet  valsartan (DIOVAN) 80 MG tablet   2. How are you currently taking this medication (dosage and times per day)?   Starting tomorrow  3. Are you having a reaction (difficulty breathing--STAT)? N/A  4. What is your medication issue?   Patient stated she picked-up the medication today but the dosage shows she should take these medications once daily.  Patient states it is her understanding that she should be taking these medications twice daily.  Patient wants prescription updated at the pharmacy ([place], [place]) to twice daily.

## 2022-11-26 NOTE — Telephone Encounter (Signed)
Spoke with patient and confirmed should be twice a day on both  Advised patient to call back next Thursday and Friday with update on blood pressure and will send new Rx's at that time

## 2022-11-27 ENCOUNTER — Encounter: Payer: Self-pay | Admitting: Internal Medicine

## 2022-11-27 ENCOUNTER — Ambulatory Visit: Payer: BC Managed Care – PPO | Attending: Internal Medicine | Admitting: Internal Medicine

## 2022-11-27 VITALS — BP 154/85 | HR 60 | Temp 98.1°F | Ht 65.0 in | Wt 184.0 lb

## 2022-11-27 DIAGNOSIS — R42 Dizziness and giddiness: Secondary | ICD-10-CM

## 2022-11-27 DIAGNOSIS — N1831 Chronic kidney disease, stage 3a: Secondary | ICD-10-CM

## 2022-11-27 DIAGNOSIS — Q613 Polycystic kidney, unspecified: Secondary | ICD-10-CM | POA: Diagnosis not present

## 2022-11-27 DIAGNOSIS — I10 Essential (primary) hypertension: Secondary | ICD-10-CM | POA: Diagnosis not present

## 2022-11-27 DIAGNOSIS — Z7689 Persons encountering health services in other specified circumstances: Secondary | ICD-10-CM

## 2022-11-27 DIAGNOSIS — D509 Iron deficiency anemia, unspecified: Secondary | ICD-10-CM

## 2022-11-27 DIAGNOSIS — M5030 Other cervical disc degeneration, unspecified cervical region: Secondary | ICD-10-CM

## 2022-11-27 NOTE — Progress Notes (Signed)
Patient ID: Heidi Herrera, female    DOB: 08-01-1966  MRN: [id number]  CC: Establish Care (New pt / Est care. /No to flu vax)   Subjective: Heidi Herrera is a 56 y.o. female who presents for new pt visit Her concerns today include:  Patient with history of HTN, PCKD, nonobstructive CAD, CKD 3, HL, IDA  Previous PCP was Benay Spice, MD at [place] to change because she wanted someone to help her navigate her care better.  Marland Kitchen  HYPERTENSION Currently taking: see medication list.  Followed by Dr. Oval Linsey; seen 2 days ago Med Adherence: '[x]'$  Yes. Diovan 120 mg BID and Coreg 12.5 mg BID. Just started taking the increase dose of Diovan today; previously 160 mg once a day Medication side effects: '[]'$  Yes    '[]'$  No Adherence with salt restriction: '[x]'$  Yes    '[]'$  No Home Monitoring?: '[x]'$  Yes    '[]'$  No Monitoring Frequency: 2x/day Home BP results range: 130-140s/80-90s SOB? '[]'$  Yes    '[x]'$  No Chest Pain?: '[]'$  Yes    '[x]'$  No Leg swelling?: '[]'$  Yes    '[x]'$  No Headaches?: '[]'$  Yes    '[x]'$  No Dizziness? 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  Seen once a yr.  She was suppose to be seen this mth but she rescheduled  Reports a chronic issue of feeling faint with palpitations.  States that she has been worked up for it and nothing really found.  Palpitations have decreased since being placed on carvedilol by Dr. Oval Linsey.  She was also evaluated by a neurologist Dr. Delice Lesch and had an EEG that was normal.  Looks like MRI of the head was ordered but she did not have it done. She has history of iron deficiency anemia.  Last CBC was done in July of last year and hemoglobin was 11.6.  Has 4 degenerative disc in neck Had MRI done recently at [facility].  Surgery recommended.  Seeing chiropractor instead as she is trying to avoid having surgery.  So far she has found it helpful.    HM:  had pap a few mths ago at [place].  This was okay.   Declines shingles vaccine until she gets BP under control  Patient Active Problem List   Diagnosis Date Noted   Allergy 07/28/2021   H. pylori infection 07/28/2021   Kidney failure 07/28/2021   Migraine 07/28/2021   Obesity 07/28/2021   Sebaceous cyst 07/28/2021   Sessile colonic polyp 07/28/2021   Abnormal weight loss 07/09/2021   Anal fissure 07/09/2021   Constipation 07/09/2021   Family history of malignant neoplasm of digestive organs 07/09/2021   Iron deficiency anemia 07/09/2021   Nausea 07/09/2021   Rectal bleeding 07/09/2021   Atherosclerotic vascular disease 07/09/2021   Dyspnea on exertion 07/09/2021   Abdominal aortic atherosclerosis ([place]) 07/09/2021   Premenstrual tension syndrome 04/24/2020   Polycystic kidney disease 11/29/2018   Benign paroxysmal positional vertigo due to bilateral vestibular disorder 09/26/2018   Body mass index (BMI) of 32.0-32.9 in adult 01/25/2018   Adjustment reaction with anxiety and depression 01/25/2018   Amenorrhea 06/28/2017   Mastodynia of left breast 06/28/2017   CKD (chronic kidney disease) stage 3, GFR 30-59 ml/min (HCC) 04/15/2017   Fibroid 12/01/2016   Anosmia 05/01/2016   Ear itch 05/01/2016   Fibroid, uterine    Essential hypertension 09/29/2014   Chronic anemia 02/02/2014   Hypertrophic  scar 05/23/2012   Seborrheic dermatitis 05/23/2012   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     calcium-vitamin D (OSCAL WITH D) 500-200 MG-UNIT per tablet Take 1 tablet by mouth 2 (two) times daily. Calcium 1000 mg with Magnesium 400 mg and zinc  15 mg, taking 1 capsule 2 times daily     carvedilol (COREG) 12.5 MG tablet Take 12.5 mg by mouth 2 (two) times daily with a meal.     Cholecalciferol (VITAMIN D3) 125 MCG (5000 UT) CAPS Take 1 capsule by mouth daily.     Cyanocobalamin (VITAMIN B 12 PO) Take 1 tablet by mouth daily.     famotidine (PEPCID) 20 MG tablet Take 20 mg by mouth 2 (two) times daily.     FERROUS FUMARATE PO Take 15 mLs by mouth daily.     Multiple Vitamin (MULTIVITAMIN WITH MINERALS) TABS tablet Take 1 tablet by mouth daily.     solifenacin (VESICARE) 5 MG tablet Take 5 mg by mouth daily as needed.     valsartan (DIOVAN) 40 MG tablet Take 40 mg by mouth 2 (two) times daily. TO BE TAKEN ALONG WITH THE 80 MG TABLETS FOR TOTAL OF 120 MG     valsartan (DIOVAN) 80 MG tablet Take 80 mg by mouth 2 (two) times daily. TO BE TAKEN ALONG WITH THE 40 MG TABLETS FOR TOTAL OF 120 MG     No current facility-administered medications on file prior to visit.    Allergies  Allergen Reactions   Bystolic [Nebivolol Hcl] Anaphylaxis, Itching and Swelling   Amlodipine     Feels poorly   Biaxin [Clarithromycin]     The category of the Biaxin family.  Also allergic to water chestnuts.   Eplerenone     Sick, skipped heart beats    Hydralazine     Palpitations    Peanut-Containing Drug Products Itching   Aspartame     Increased urination    Erythromycin Base Rash    Social History   Socioeconomic History   Marital status: Divorced    Spouse name: Not on file   Number of children: 1   Years of education: Not on file   Highest education level: Master's degree (e.g., MA, MS, MEng, MEd, MSW, MBA)  Occupational History    Comment: [employer]  Tobacco Use   Smoking status: Never   Smokeless tobacco: Never  Vaping Use   Vaping Use: Never used  Substance and Sexual Activity   Alcohol use: Not Currently    Alcohol/week: 1.0 standard drink of alcohol    Types: 1 Glasses of wine per week    Comment: quit years ago   Drug use: No    Sexual activity: Not on file  Other Topics Concern   Not on file  Social History Narrative   Lives alone   No caffiene   Right handed   Two story and steps inside   Social Determinants of Health   Financial Resource Strain: Low Risk  (05/13/2022)   Overall Financial Resource Strain (CARDIA)    Difficulty of Paying Living Expenses: Not hard at all  Food Insecurity: No Food  Insecurity (05/13/2022)   Hunger Vital Sign    Worried About Running Out of Food in the Last Year: Never true    Ran Out of Food in the Last Year: Never true  Transportation Needs: No Transportation Needs (05/13/2022)   PRAPARE - Hydrologist (Medical): No    Lack of Transportation (Non-Medical): No  Physical Activity: Inactive (05/13/2022)   Exercise Vital Sign    Days of Exercise per Week: 0 days    Minutes of Exercise per Session: 0 min  Stress: Not on file  Social Connections: Not on file  Intimate Partner Violence: Not on file    Family History  Problem Relation Age of Onset   Hypertension Mother    Obesity Mother    Stroke Mother    Diabetes Mother    Kidney Stones Mother    Mental illness Father    Hypertension Father    Heart attack Sister    Hypertension Sister    Hyperlipidemia Sister    Obesity Sister    Diabetes Sister    Mental illness Sister    Glaucoma Sister    Hypertension Sister    Mental illness Sister    Cancer Sister 40   Diabetes Sister    Hypertension Brother    Obesity Brother    Stroke Brother    Glaucoma Brother    Cancer Brother    Stroke Brother    Heart disease Maternal Uncle    Glaucoma Maternal Uncle    Esophageal cancer Maternal Grandfather    Pancreatic cancer Cousin    Stomach cancer Neg Hx    Colon cancer Neg Hx     Past Surgical History:  Procedure Laterality Date   IR GENERIC HISTORICAL  12/01/2016   IR ANGIOGRAM PELVIS SELECTIVE OR SUPRASELECTIVE 12/01/2016 Sandi Mariscal, MD [facility]   IR GENERIC HISTORICAL   12/01/2016   IR US GUIDE VASC ACCESS RIGHT 12/01/2016 Sandi Mariscal, MD [facility]   IR GENERIC HISTORICAL  12/01/2016   IR EMBO ARTERIAL NOT HEMORR HEMANG INC GUIDE ROADMAPPING 12/01/2016 Sandi Mariscal, MD [facility]   IR GENERIC HISTORICAL  12/01/2016   IR ANGIOGRAM SELECTIVE EACH ADDITIONAL VESSEL 12/01/2016 Sandi Mariscal, MD [facility]   IR GENERIC HISTORICAL  12/01/2016   IR ANGIOGRAM SELECTIVE EACH ADDITIONAL VESSEL 12/01/2016 Sandi Mariscal, MD [facility]   IR GENERIC HISTORICAL  12/01/2016   IR ANGIOGRAM PELVIS SELECTIVE OR SUPRASELECTIVE 12/01/2016 Sandi Mariscal, MD [facility]   IR GENERIC HISTORICAL  01/07/2017   IR RADIOLOGIST EVAL & MGMT 01/07/2017 Sandi Mariscal, MD [facility]   IR RADIOLOGIST EVAL & MGMT  08/31/2017   LOBECTOMY  2009   MYOMECTOMY  2007   UTERINE FIBROID EMBOLIZATION     12/01/2016    ROS: Review of Systems Negative except as stated above  PHYSICAL EXAM: BP (!) 154/85 (BP Location: Left Arm, Patient Position: Sitting, Cuff Size: Normal)   Pulse 60   Temp 98.1 F (36.7 C) (Oral)   Ht '5\' 5"'$  (1.651 m)   Wt 184 lb (83.5 kg)   SpO2 99%   BMI 30.62 kg/m   Physical Exam 160/90 General appearance - alert, well appearing, older African-American female and in no distress Mental status - normal mood, behavior, speech, dress, motor activity, and thought processes Eyes - pupils equal and reactive, extraocular eye movements intact Mouth - mucous membranes moist, pharynx normal without lesions Neck - supple, no significant adenopathy Chest - clear to  auscultation, no wheezes, rales or rhonchi, symmetric air entry Heart - normal rate, regular rhythm, normal S1, S2, no murmurs, rubs, clicks or gallops Extremities - peripheral pulses normal, no pedal edema, no clubbing or cyanosis      Latest Ref Rng & Units 09/28/2022    1:23 PM 08/06/2021    8:53 AM 07/09/2021   11:19 AM  CMP  Glucose 70 - 99 mg/dL 82  77  74   BUN 6 - 24 mg/dL '11  11  17   '$ Creatinine  0.57 - 1.00 mg/dL 1.15  1.20  1.11   Sodium 134 - 144 mmol/L 144  142  148   Potassium 3.5 - 5.2 mmol/L 4.7  4.8  4.6   Chloride 96 - 106 mmol/L 107  105  106   CO2 20 - 29 mmol/L '25  21  27   '$ Calcium 8.7 - 10.2 mg/dL 9.4  9.2  9.3   Total Protein 6.0 - 8.5 g/dL  7.3    Total Bilirubin 0.0 - 1.2 mg/dL  0.4    Alkaline Phos 44 - 121 IU/L  53    AST 0 - 40 IU/L  16    ALT 0 - 32 IU/L  11     Lipid Panel     Component Value Date/Time   CHOL 176 08/06/2021 0853   TRIG 63 08/06/2021 0853   HDL 42 08/06/2021 0853   CHOLHDL 4.2 08/06/2021 0853   CHOLHDL 4.1 01/05/2014 0823   VLDL 12 01/05/2014 0823   LDLCALC 122 (H) 08/06/2021 0853   LDLDIRECT 148.8 11/26/2008 1223    CBC    Component Value Date/Time   WBC 4.3 06/26/2021 0106   RBC 4.10 06/26/2021 0106   HGB 11.6 (L) 06/26/2021 0106   HGB 11.2 02/08/2020 1500   HCT 36.1 06/26/2021 0106   HCT 34.0 02/08/2020 1500   PLT 197 06/26/2021 0106   PLT 231 02/08/2020 1500   MCV 88.0 06/26/2021 0106   MCV 86 02/08/2020 1500   MCH 28.3 06/26/2021 0106   MCHC 32.1 06/26/2021 0106   RDW 13.8 06/26/2021 0106   RDW 12.9 02/08/2020 1500   LYMPHSABS 1.4 05/29/2021 0104   LYMPHSABS 1.7 02/08/2020 1500   MONOABS 0.4 05/29/2021 0104   EOSABS 0.2 05/29/2021 0104   EOSABS 0.1 02/08/2020 1500   BASOSABS 0.0 05/29/2021 0104   BASOSABS 0.0 02/08/2020 1500    ASSESSMENT AND PLAN: 1. Establishing care with new doctor, encounter for   2. Essential hypertension Not at goal.  I have not made any changes to her medications as she just started the increased dose of Diovan today.  She will continue the Diovan 120 mg twice a day and carvedilol.  Follow-up with clinical pharmacist in 1 month for blood pressure recheck - Comprehensive metabolic panel; Future - Lipid panel; Future  3. Iron deficiency anemia, unspecified iron deficiency anemia type - CBC; Future - Iron, TIBC and Ferritin Panel; Future  4. Polycystic kidney disease Encouraged her  to reschedule her appointment with her nephrologist. - Comprehensive metabolic panel; Future - Microalbumin / creatinine urine ratio; Future  5. Stage 3a chronic kidney disease ([place]) See #4 above. - Microalbumin / creatinine urine ratio; Future  6. Feeling faint Chronic.  Will check CBC.  7. Degenerative disc disease, cervical Reports some improvement by going to the chiropractor.     Patient was given the opportunity to ask questions.  Patient verbalized understanding of the plan and was able to repeat  key elements of the plan.   This documentation was completed using Radio producer.  Any transcriptional errors are unintentional.  Orders Placed This Encounter  Procedures   CBC   Comprehensive metabolic panel   Lipid panel   Iron, TIBC and Ferritin Panel   Microalbumin / creatinine urine ratio     Requested Prescriptions    No prescriptions requested or ordered in this encounter    Return in about 4 months (around 03/29/2023) for Appt with [facility] in 4 wks for BP check.  Sign release to get records from [place], MD, Rosalita Chessman

## 2022-12-09 ENCOUNTER — Telehealth: Payer: Self-pay | Admitting: Cardiovascular Disease

## 2022-12-09 MED ORDER — VALSARTAN 40 MG PO TABS: 40.0000 mg | ORAL_TABLET | Freq: Two times a day (BID) | ORAL | 3 refills | Status: DC

## 2022-12-09 MED ORDER — VALSARTAN 80 MG PO TABS: 80.0000 mg | ORAL_TABLET | Freq: Two times a day (BID) | ORAL | 3 refills | Status: DC

## 2022-12-09 NOTE — Telephone Encounter (Signed)
Rx sent to new pharmacy.

## 2022-12-09 NOTE — Telephone Encounter (Signed)
Pt c/o medication issue:  1. Name of Medication:  valsartan (DIOVAN) 40 MG tablet  valsartan (DIOVAN) 80 MG tablet    2. How are you currently taking this medication (dosage and times per day)?   3. Are you having a reaction (difficulty breathing--STAT)?   4. What is your medication issue? Patient called back for scripts to be sent to pharmacy for the above medication.  She would like for them to be sent to   [place], [place]  See phone note dated 11/26/22.

## 2022-12-16 NOTE — Telephone Encounter (Signed)
Patient saw Dr Oval Linsey 12/13

## 2022-12-25 ENCOUNTER — Ambulatory Visit: Payer: BC Managed Care – PPO | Admitting: Internal Medicine

## 2023-01-05 ENCOUNTER — Ambulatory Visit: Payer: BC Managed Care – PPO | Admitting: Pharmacist

## 2023-01-29 ENCOUNTER — Encounter: Payer: Self-pay | Admitting: Pharmacist

## 2023-01-29 ENCOUNTER — Ambulatory Visit: Payer: BC Managed Care – PPO | Attending: Internal Medicine | Admitting: Pharmacist

## 2023-01-29 VITALS — BP 149/80 | HR 56

## 2023-01-29 DIAGNOSIS — I10 Essential (primary) hypertension: Secondary | ICD-10-CM | POA: Diagnosis not present

## 2023-01-29 NOTE — Progress Notes (Signed)
S:     No chief complaint on file.  57 y.o. female who presents for hypertension evaluation, education, and management.  PMH is significant for HTN, PCKD, nonobstructive CAD, CKD 3, HL, IDA.   Patient was referred and last seen by Primary Care Provider, Dr. Wynetta Emery, on 11/27/2022. BP was 154/85 mmHg at that visit. Of note, patient also sees Cardiology (last visit with Dr. Oval Linsey - 11/25/2022). With Dr. Wynetta Emery, she reported good medication adherence with okay BP values at home. No changes were made to her medications as she had just started a higher valsartan dose 1-2 days prior to seeing Dr. Wynetta Emery.   Today, patient arrives in good spirits and presents without assistance. Denies dizziness, headache, blurred vision, swelling.   Patient reports hypertension is longstanding. She has intolerances to several medications and follows Dr. Oval Linsey.    Family/Social history:  Fhx: HTN, Stroke, MI, HLD, DM, obesity Tobacco: never smoker (secondhand smoke from neighbor)  Alcohol: none reported   Medication adherence reported. Patient has taken BP medications today.   Current antihypertensives include: carvedilol 12.14m BID, valsartan 240 mg (takes 120 mg BID)  Antihypertensives tried in the past include: amlodipine (feels poorly), nebivolol (anaphylaxis), Eplerenone (sick, skipped heart beats), hydralazine (palpitations), HCTZ (frequent urination), lisinopril (patient preference), lisinopril-HCTZ (preference, frequent urination), spironolactone (urinary hesitancy)   Reported home BP readings:  Brings log from December.  -SBP range: 130 - 154. Most in the 130-140 range.  -DBP range: 82 - 95. 2 outliers of 100, 102  In the morning, she takes BP before medications. In the evenings, she takes her BP before her PM doses.   Patient reported dietary habits:  -Compliant with salt restriction  -Denies excessive caffeine intake   Patient-reported exercise habits:  -Not as much as I'm supposed  to.  -Endorses excessive fatigue  O:  Vitals:   01/29/23 1426  BP: (!) 149/80  Pulse: (!) 56     Last 3 Office BP readings: BP Readings from Last 3 Encounters:  01/29/23 (!) 149/80  11/27/22 (!) 154/85  11/25/22 (!) 198/102    BMET    Component Value Date/Time   NA 144 09/28/2022 1323   K 4.7 09/28/2022 1323   CL 107 (H) 09/28/2022 1323   CO2 25 09/28/2022 1323   GLUCOSE 82 09/28/2022 1323   GLUCOSE 123 (H) 06/26/2021 0106   BUN 11 09/28/2022 1323   CREATININE 1.15 (H) 09/28/2022 1323   CREATININE 1.17 (H) 04/04/2016 1048   CALCIUM 9.4 09/28/2022 1323   GFRNONAA 58 (L) 06/26/2021 0106   GFRNONAA 49 (L) 02/13/2016 1942   GFRAA 56 (L) 05/07/2020 1133   GFRAA 56 (L) 02/13/2016 1942    Renal function: CrCl cannot be calculated (Patient's most recent lab result is older than the maximum 21 days allowed.).  Clinical ASCVD: No  The 10-year ASCVD risk score (Arnett DK, et al., 2019) is: 10.3%   Values used to calculate the score:     Age: [age]years     Sex: Female     Is Non-Hispanic African American: Yes     Diabetic: No     Tobacco smoker: No     Systolic Blood Pressure: 1123456mmHg     Is BP treated: Yes     HDL Cholesterol: 42 mg/dL     Total Cholesterol: 176 mg/dL  A/P: Hypertension longstanding currently above goal but improved since last clinic visit on current medications. BP goal < 130/80 mmHg. Medication adherence appears appropriate. She sees  Dr. Oval Linsey next week. Her home blood pressure values are okay. Will defer further changes to Cardiology.   -Continued current medication regimen.  -F/u labs ordered - none today -Counseled on lifestyle modifications for blood pressure control including reduced dietary sodium, increased exercise, adequate sleep. -Encouraged patient to check BP at home and bring log of readings to next visit. Counseled on proper use of home BP cuff.   Results reviewed and written information provided.    Written patient instructions  provided. Patient verbalized understanding of treatment plan.  Total time in face to face counseling 30 minutes.    Follow-up:  Pharmacist prn. PCP clinic visit in 03/22/2023.   Benard Halsted, PharmD, [place], [place] [phone]

## 2023-02-01 ENCOUNTER — Ambulatory Visit (HOSPITAL_BASED_OUTPATIENT_CLINIC_OR_DEPARTMENT_OTHER): Payer: BC Managed Care – PPO | Admitting: Cardiovascular Disease

## 2023-02-01 ENCOUNTER — Encounter (HOSPITAL_BASED_OUTPATIENT_CLINIC_OR_DEPARTMENT_OTHER): Payer: Self-pay | Admitting: Cardiovascular Disease

## 2023-02-01 ENCOUNTER — Encounter (HOSPITAL_BASED_OUTPATIENT_CLINIC_OR_DEPARTMENT_OTHER): Payer: Self-pay

## 2023-02-01 ENCOUNTER — Telehealth (HOSPITAL_BASED_OUTPATIENT_CLINIC_OR_DEPARTMENT_OTHER): Payer: Self-pay | Admitting: *Deleted

## 2023-02-01 VITALS — BP 137/90 | HR 61 | Ht 65.0 in | Wt 188.4 lb

## 2023-02-01 DIAGNOSIS — I700 Atherosclerosis of aorta: Secondary | ICD-10-CM

## 2023-02-01 DIAGNOSIS — Z006 Encounter for examination for normal comparison and control in clinical research program: Secondary | ICD-10-CM

## 2023-02-01 DIAGNOSIS — I10 Essential (primary) hypertension: Secondary | ICD-10-CM

## 2023-02-01 NOTE — Patient Instructions (Addendum)
Medication Instructions:  Your physician recommends that you continue on your current medications as directed. Please refer to the Current Medication list given to you today.   Labwork: NONE  Testing/Procedures: NONE  Follow-Up: 04/13/2023 1:45 PM WITH DR [facility]   Any Other Special Instructions Will Be Listed Below (If Applicable). MONITOR BLOOD PRESSURE TWICE A DAY, LOG ON SHEETS PROVIDED. BRING LOG AND MACHINE TO FOLLOW UP   WATCH YOUR GLUTEN INTAKE TO SEE IF THAT HELPS WITH  YOUR BLOATING   YOU HAVE BEEN REFERRED TO THE PREP ([facility]) PROGRAM. IF YOU DO NOT HEAR FROM [facility] OR [facility] IN 2 WEEKS CALL THE OFFICE TO FOLLOW UP   If you need a refill on your cardiac medications before your next appointment, please call your pharmacy.

## 2023-02-01 NOTE — Progress Notes (Signed)
Advanced Hypertension Clinic Follow-up:  Date:  02/01/2023   ID:  Heidi Herrera, DOB January 09, 1966, MRN [id number]  PCP:  Ladell Pier, MD  Cardiologist:   Skeet Latch, MD   No chief complaint on file.   History of Present Illness: Heidi Herrera is a 57 y.o. female with hypertension, aortic atherosclerosis, non-obstructive CAD, CKD 3, polycystic kidney disease, horseshoe kidney, and hyperlipidemia here for follow-up. She was initially seen 05/13/2022 to establish care.  She last saw Dr. Lennox Pippins 07/2021 and complained of frequent urination.  Lisinopril was increased while hydrochlorothiazide was reduced.  At that visit her blood pressure was 138/84.  She previously had a nuclear stress test 06/2021 that revealed LVEF 58% with no ischemia.  She saw her PCP 01/2022 and was no longer on HCTZ.  She had also self discontinued lisinopril and her blood pressure was 172/108.  It was recommended that she resume the lisinopril.  She followed up with the urologist and they started her on Vesicare for urinary frequency.    She saw a cardiologist at [facility] 01/2022 and reported palpitations.  She wore a 30-day event recorder which showed sinus tachycardia but no other significant arrhythmias.  Her BP was 168/98 and 154/84.  At the time she was only taking lisinopril.  She reported palpitations and had an event recorder that revealed sinus tachycardia in the middle of the night up to 140 bpm.  She was started on metoprolol succinate but still had some palpitations. Metoprolol was switched to carvedilol. She was enrolled in the remote patient monitoring study. She called the office stating that carvedilol was causing her to have palpitations. She also had concerns with her memory, and was advised to discuss with hear PCP. She saw Nehemiah Massed, PharmD 07/2022 and noted a burning pain in her liver an hour after taking lisinopril. She was switched to valsartan. It was noted that her blood pressure readings in  the office were much higher than in [place].   She had an MRI done for her cervical spine. She was told that she might need surgery but she has been looking into alternative options. She met with a chiropractor. They told her that her spine pressing on her nerves could be the reason of her vision, blood pressure, and digestive issues. The pain on her back and neck feels like a pressure weighing her down and she hopes that by going along with this chiropractic treatment she could resolve her other issues. At the last visit her blood pressure was higher in the office than on home monitoring. Her renin and aldosterone levels were indeterminate, and spironolactone was added. She called the office reporting side effects of spironolactone, and valsartan was increased. Blood pressures in [employer] for the last 2 weeks have ranged from the 120s-160s over 80s-100s. It has mostly been around 140/80s-90s. She had an MRI at [facility] that revealed innumerable cysts in bilateral kidneys, suggestive of polycystic kidney disease and a likely horseshoe kidney. She also had multiple hepatic cysts and uterine fibroids. She had no adrenal adenomas.  At the last visit her blood pressure continued to be labile, averaging in the [id number] systolic. We recommended adding indapamide, but she wanted to increase valsartan. Recent blood pressures in [employer] are averaging in th 130s/90s. Today, she reports feeling bloated more frequently since she started taking valsartan BID. Usually this is worse in the mornings. In clinic today her blood pressure is 156/92 (recheck 158/92). Sometimes she experiences LE swelling in the evenings. She  continues to work on increasing her exercise. She is limited by chronic neck problems. It is difficult for her to sit or stand for extended periods. Most of the time she does well with her diet. Recently she has found out that she has a gluten sensitivity. She has tried eliminating this from her diet, but still  experiences bloating. She is not sure if she is still eating foods that may contain gluten. Every now and then she may consume fish. She denies any palpitations, chest pain, shortness of breath, or peripheral edema. No lightheadedness, headaches, syncope, orthopnea, or PND.  Prior Antihypertensives: Irbesartan Spironolactone - urinary hesitancy Amlodipine Eplerenone Hydralazine HCTZ Lisinopril  Past Medical History:  Diagnosis Date   Acid reflux    Allergy    Anemia    Anxiety    Fibroids    GERD (gastroesophageal reflux disease)    H. pylori infection    Hypertension    Kidney failure    stage 2   Migraine    Obesity    Polycystic kidney disease    Sebaceous cyst    Sessile colonic polyp    Vertigo     Past Surgical History:  Procedure Laterality Date   IR GENERIC HISTORICAL  12/01/2016   IR ANGIOGRAM PELVIS SELECTIVE OR SUPRASELECTIVE 12/01/2016 Sandi Mariscal, MD [facility]   IR GENERIC HISTORICAL  12/01/2016   IR US GUIDE VASC ACCESS RIGHT 12/01/2016 Sandi Mariscal, MD [facility]   IR GENERIC HISTORICAL  12/01/2016   IR EMBO ARTERIAL NOT HEMORR HEMANG INC GUIDE ROADMAPPING 12/01/2016 Sandi Mariscal, MD [facility]   IR GENERIC HISTORICAL  12/01/2016   IR ANGIOGRAM SELECTIVE EACH ADDITIONAL VESSEL 12/01/2016 Sandi Mariscal, MD [facility]   IR GENERIC HISTORICAL  12/01/2016   IR ANGIOGRAM SELECTIVE EACH ADDITIONAL VESSEL 12/01/2016 Sandi Mariscal, MD [facility]   IR GENERIC HISTORICAL  12/01/2016   IR ANGIOGRAM PELVIS SELECTIVE OR SUPRASELECTIVE 12/01/2016 Sandi Mariscal, MD [facility]   IR GENERIC HISTORICAL  01/07/2017   IR RADIOLOGIST EVAL & MGMT 01/07/2017 Sandi Mariscal, MD [facility]   IR RADIOLOGIST EVAL & MGMT  08/31/2017   LOBECTOMY  2009   MYOMECTOMY  2007   UTERINE FIBROID EMBOLIZATION     12/01/2016     Current Outpatient Medications  Medication Sig Dispense Refill   Bacillus Coagulans-Inulin (ALIGN PREBIOTIC-PROBIOTIC PO) Take by mouth.      calcium-vitamin D (OSCAL WITH D) 500-200 MG-UNIT per tablet Take 1 tablet by mouth 2 (two) times daily. Calcium 1000 mg with Magnesium 400 mg and zinc 15 mg, taking 1 capsule 2 times daily     carvedilol (COREG) 12.5 MG tablet Take 12.5 mg by mouth 2 (two) times daily with a meal.     Cholecalciferol (VITAMIN D3) 125 MCG (5000 UT) CAPS Take 1 capsule by mouth daily.     Cyanocobalamin (VITAMIN B 12 PO) Take 1 tablet by mouth daily.     famotidine (PEPCID) 20 MG tablet Take 20 mg by mouth 2 (two) times daily.     FERROUS FUMARATE PO Take 15 mLs by mouth daily.     Multiple Vitamin (MULTIVITAMIN WITH MINERALS) TABS tablet Take 1 tablet by mouth daily.     solifenacin (VESICARE) 5 MG tablet Take 5 mg by mouth daily as needed.     valsartan (DIOVAN) 40 MG tablet Take 1 tablet (40 mg total) by mouth 2 (two) times daily. TO BE TAKEN ALONG WITH THE 80 MG TABLETS FOR TOTAL OF  120 MG 180 tablet 3   valsartan (DIOVAN) 80 MG tablet Take 1 tablet (80 mg total) by mouth 2 (two) times daily. TO BE TAKEN ALONG WITH THE 40 MG TABLETS FOR TOTAL OF 120 MG 180 tablet 3   No current facility-administered medications for this visit.    Allergies:   Bystolic [nebivolol hcl], Amlodipine, Biaxin [clarithromycin], Eplerenone, Hydralazine, Peanut-containing drug products, Aspartame, and Erythromycin base    Social History:  The patient  reports that she has never smoked. She has never used smokeless tobacco. She reports that she does not currently use alcohol after a past usage of about 1.0 standard drink of alcohol per week. She reports that she does not use drugs.   Family History:  The patient's family history includes Cancer in her brother; Cancer (age of onset: 46) in her sister; Diabetes in her mother, sister, and sister; Esophageal cancer in her maternal grandfather; Glaucoma in her brother, maternal uncle, and sister; Heart attack in her sister; Heart disease in her maternal uncle; Hyperlipidemia in her sister;  Hypertension in her brother, father, mother, sister, and sister; Kidney Stones in her mother; Mental illness in her father, sister, and sister; Obesity in her brother, mother, and sister; Pancreatic cancer in her cousin; Stroke in her brother, brother, and mother.    ROS:   Please see the history of present illness.    (+) Abdominal bloating (+) LE edema (+) Chronic neck pain and back pain All other systems are reviewed and negative.   PHYSICAL EXAM: VS:  BP (!) 137/90 Comment: home  Pulse 61   Ht 5' 5"$  (1.651 m)   Wt 188 lb 6.4 oz (85.5 kg)   BMI 31.35 kg/m  , BMI Body mass index is 31.35 kg/m. GENERAL:  Well appearing HEENT:  Pupils equal round and reactive, fundi not visualized, oral mucosa unremarkable NECK:  No jugular venous distention, waveform within normal limits, carotid upstroke brisk and symmetric, no bruits, no thyromegaly LUNGS:  Clear to auscultation bilaterally HEART:  RRR.  PMI not displaced or sustained,S1 and S2 within normal limits, no S3, no S4, no clicks, no rubs, no murmurs ABD:  Flat, positive bowel sounds normal in frequency in pitch, no bruits, no rebound, no guarding, no midline pulsatile mass, no hepatomegaly, no splenomegaly EXT:  2 plus pulses throughout, no edema, no cyanosis no clubbing SKIN:  No rashes no nodules NEURO:  Cranial nerves II through XII grossly intact, motor grossly intact throughout PSYCH:  Cognitively intact, oriented to person place and time  EKG:  EKG is personally reviewed.  02/01/2023:  Sinus rhythm. Rate 61 bpm. 11/25/2022:  EKG was not ordered. 09/21/2022: EKG was not ordered. 05/13/2022: Sinus rhythm.  Rate 69 bpm  MRI Abdomen  11/18/2022  [facility]): IMPRESSION:  1. Enlarged bilateral kidneys with innumerable cysts essentially replacing the renal parenchyma suggesting polycystic kidney disease. Suggestion of a horseshoe kidney.  2.  Multiple subcentimeter hepatic cysts.  3.  Gallbladder sludge.  4.  Uterine fibroids.   5.  Mild pelvic fluid.   Echo 05/26/2022:  IMPRESSIONS   1. Left ventricular ejection fraction, by estimation, is 60 to 65%. The  left ventricle has normal function. The left ventricle has no regional  wall motion abnormalities. There is mild concentric left ventricular  hypertrophy. Left ventricular diastolic  parameters are indeterminate, but likely with impaired relaxation.   2. Right ventricular systolic function is normal. The right ventricular  size is normal.   3. Left atrial size  was moderately dilated.   4. The mitral valve is normal in structure. Trivial mitral valve  regurgitation.   5. The aortic valve is tricuspid. Aortic valve regurgitation is trivial.  No aortic stenosis is present.   6. The inferior vena cava is normal in size with greater than 50%  respiratory variability, suggesting right atrial pressure of 3 mmHg.   Monitor 02/2022 ([place]): 1.  Normal sinus rhythm predominates recording 2.  PACs and PAC burst are noted.  None of these are sustained 3.  Sinus tachycardia is noted during daytime activity, sinus bradycardia is noted during evening hours 4.  No significant arrhythmias are noted otherwise.   Exercise Myoview 07/11/2021: The left ventricular ejection fraction is normal (55-65%). Nuclear stress EF: 58%. There was no ST segment deviation noted during stress. No T wave inversion was noted during stress. The study is normal. This is a low risk study.   1.  Normal myocardial perfusion imaging study without evidence of ischemia or infarction. 2.  Normal LVEF, 58%. 3.  Excellent functional capacity (10:31 min:s; 12.5 METS).  4.  Normal heart rate and blood pressure response to exercise. 5.  This is a low risk study.  Echo 05/2021:  LVEF 60-65%  Recent Labs: 09/28/2022: BUN 11; Creatinine, Ser 1.15; Potassium 4.7; Sodium 144    Lipid Panel    Component Value Date/Time   CHOL 176 08/06/2021 0853   TRIG 63 08/06/2021 0853   HDL 42  08/06/2021 0853   CHOLHDL 4.2 08/06/2021 0853   CHOLHDL 4.1 01/05/2014 0823   VLDL 12 01/05/2014 0823   LDLCALC 122 (H) 08/06/2021 0853   LDLDIRECT 148.8 11/26/2008 1223     Wt Readings from Last 3 Encounters:  02/01/23 188 lb 6.4 oz (85.5 kg)  11/27/22 184 lb (83.5 kg)  11/25/22 184 lb 6.4 oz (83.6 kg)    ASSESSMENT AND PLAN:  Essential hypertension Blood pressure has been averaging in the 130s/80s at home.  Her goal is <130/80.  She is going to keep working on her diet and exercise.  Will refer her to the PREP program at the [facility].  She struggles with neck and back pain.  Recommended water exercises.  She has intolerance to many medications.   She notes increased abdominal bloating since she increased valsartan to twice daily.  She also notes that she was diagnosed with gluten sensitivity.  She is going to look at her diet and see if she can completely eliminate gluten before we make any changes.  Continue carvedilol and valsartan for now.  We discussed the fact that there is a lot of great other options.  Could consider clonidine or minoxidil.  She is bradycardic so would not add diltiazem or further titrate her carvedilol.  We also discussed renal denervation as a potential option.  Her blood pressures in the office seem to be higher than they are at home.  She has completed a remote patient monitoring study and returned her To the fire but patient monitoring device today.   Abdominal aortic atherosclerosis ([place]) She also has non-obstructive CAD.  She is really working on diet and exercise as above.  She already has very little animal products.  She is going to continue to try and limit this.  She prefers natural options instead of medications.  Lipids are improving.  Continue to monitor.  Her LDL goal is less than 70.   Disposition:    FU with Wilder Amodei C. Oval Linsey, MD, [facility] in 2 months.  Medication Adjustments/Labs and Tests Ordered: Current medicines are reviewed at length with the  patient today.  Concerns regarding medicines are outlined above.   Orders Placed This Encounter  Procedures   Amb Referral To Provider Referral Exercise Program (P.R.E.P)   Cantril's Ladder Assessment   EKG 12-Lead   No orders of the defined types were placed in this encounter.   I,Mathew Stumpf,acting as a Education administrator for Skeet Latch, MD.,have documented all relevant documentation on the behalf of Skeet Latch, MD,as directed by  Skeet Latch, MD while in the presence of Skeet Latch, MD.  I, Herminie Oval Linsey, MD have reviewed all documentation for this visit.  The documentation of the exam, diagnosis, procedures, and orders on 02/01/2023 are all accurate and complete.  Signed, Riccardo Holeman C. Oval Linsey, MD, [facility]  02/01/2023 4:39 PM    [place]

## 2023-02-01 NOTE — Telephone Encounter (Signed)
Patient returned Vivify  blood pressure machine

## 2023-02-01 NOTE — Assessment & Plan Note (Signed)
She also has non-obstructive CAD.  She is really working on diet and exercise as above.  She already has very little animal products.  She is going to continue to try and limit this.  She prefers natural options instead of medications.  Lipids are improving.  Continue to monitor.  Her LDL goal is less than 70.

## 2023-02-01 NOTE — Assessment & Plan Note (Signed)
Blood pressure has been averaging in the 130s/80s at home.  Her goal is <130/80.  She is going to keep working on her diet and exercise.  Will refer her to the PREP program at the [facility].  She struggles with neck and back pain.  Recommended water exercises.  She has intolerance to many medications.   She notes increased abdominal bloating since she increased valsartan to twice daily.  She also notes that she was diagnosed with gluten sensitivity.  She is going to look at her diet and see if she can completely eliminate gluten before we make any changes.  Continue carvedilol and valsartan for now.  We discussed the fact that there is a lot of great other options.  Could consider clonidine or minoxidil.  She is bradycardic so would not add diltiazem or further titrate her carvedilol.  We also discussed renal denervation as a potential option.  Her blood pressures in the office seem to be higher than they are at home.  She has completed a remote patient monitoring study and returned her To the fire but patient monitoring device today.

## 2023-02-01 NOTE — Research (Signed)
I saw pt today after Dr. Blenda Mounts follow up visit. Pt is in Dr. Blenda Mounts Virtual Care HTN Study. Pt filled out research survey. Pt was enrolled in Group 2. Pt has successfully reached her blood pressure goal and completed the Virtual Care HTN Study.

## 2023-02-02 ENCOUNTER — Telehealth: Payer: Self-pay

## 2023-02-02 NOTE — Telephone Encounter (Signed)
Vmt pt requesting call back to discuss referral to PREP.

## 2023-02-03 ENCOUNTER — Encounter (HOSPITAL_BASED_OUTPATIENT_CLINIC_OR_DEPARTMENT_OTHER): Payer: Self-pay

## 2023-02-04 ENCOUNTER — Telehealth: Payer: Self-pay

## 2023-02-04 NOTE — Telephone Encounter (Signed)
Patient LVM requesting call back to discuss PREP class Called patient and confirmed she is interested in participating. Requested a T/TH class at 1pm Next one starting will be in April at [facility].  Will call her closer to start of program to set up intake.  Goals are to not have to have surgery on back and neck.  Explained PREP to her and answered questions. Patient has my number for call back.

## 2023-02-09 ENCOUNTER — Other Ambulatory Visit (HOSPITAL_BASED_OUTPATIENT_CLINIC_OR_DEPARTMENT_OTHER): Payer: Self-pay | Admitting: Internal Medicine

## 2023-02-09 DIAGNOSIS — E785 Hyperlipidemia, unspecified: Secondary | ICD-10-CM

## 2023-02-11 ENCOUNTER — Ambulatory Visit: Payer: BC Managed Care – PPO | Admitting: Dietician

## 2023-03-04 ENCOUNTER — Encounter: Payer: BC Managed Care – PPO | Attending: Cardiovascular Disease | Admitting: Dietician

## 2023-03-04 ENCOUNTER — Encounter: Payer: Self-pay | Admitting: Dietician

## 2023-03-04 VITALS — Wt 190.0 lb

## 2023-03-04 DIAGNOSIS — N1831 Chronic kidney disease, stage 3a: Secondary | ICD-10-CM

## 2023-03-04 DIAGNOSIS — R140 Abdominal distension (gaseous): Secondary | ICD-10-CM | POA: Diagnosis not present

## 2023-03-04 DIAGNOSIS — K219 Gastro-esophageal reflux disease without esophagitis: Secondary | ICD-10-CM | POA: Diagnosis not present

## 2023-03-04 DIAGNOSIS — N183 Chronic kidney disease, stage 3 unspecified: Secondary | ICD-10-CM | POA: Diagnosis not present

## 2023-03-04 DIAGNOSIS — Z713 Dietary counseling and surveillance: Secondary | ICD-10-CM | POA: Diagnosis not present

## 2023-03-04 DIAGNOSIS — I129 Hypertensive chronic kidney disease with stage 1 through stage 4 chronic kidney disease, or unspecified chronic kidney disease: Secondary | ICD-10-CM | POA: Insufficient documentation

## 2023-03-04 NOTE — Progress Notes (Signed)
Medical Nutrition Therapy  Appointment Start time:  [id number]  Appointment End time:  1200 Pt is here today alone.  She was last seen by myself 11/13/2022.  Patient states that she has a gluten sensitivity.  She was negative for Celiac.  She feels very bloated.  Stopped her Probiotic as she felt it didn't help.  She has had a breath test for h-pylori which was negative.  Doctor wants her on a low Fod-map diet. Reports that she has had to go to the ER a couple of times in 2023 due to feeling faint. She states that at times she feels like her heart flutters when she eats certain foods. Remembered a skin prick allergy test that she had done in 2022 which noted several food allergies noted in note below.  She reports negative reactions when she eats these foods.  She states that she does not have enough energy when she eats too strictly. States that she gets lightheaded when she eats too much protein. States that her blood pressure is still elevated. Likely is too restrictive. Weight is stable.  Primary concerns today: Referral diagnosis: Essential HTN, GERD, kidney failure Preferred learning style: no preference indicated Learning readiness: change in progress  NUTRITION ASSESSMENT   Anthropometrics  190 lbs 03/04/2023  "water weight" 186 lbs 11/13/2022.  Feels that she has water weight 186 lb She states her weight has been stable recently.  Noted 183 lbs 06/18/2022. 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 She states that sometimes her joints ache when she eats but isn't sure what causes it. She notices it more with chicken, eggs, and sugary foods. She does not eat beef or pork. She tries to eat mostly plant-based but does not feel like she is  getting enough nutrition.  Food Allergies (per skin prick test per patient): gluten (bloat, fullness), corn (she does not note symptoms), peanuts (itches), aspartame (frequent urination), thinks she is allergic to avocado's Too much rice causes blurry vision Likely lactose intolerant, intolerant to gluten Allergies - per skin testing 05/27/2021:  Peanuts, almonds, green tea, avocado, banana, cantelope, chicken, shellfish, fish High histamine.  Estimated daily fluid intake: 48 oz water, 16oz tea Supplements: MVI, Iron at times (causes constipation), vitamin D, vitamin B-complex, zinc, hair/skin/nails, CoQ10 Sleep: 7 hours with interruptions to use the restroom. Only feels well-rested sometimes. Goes to bed 10:30/11pm. Stress / self-care: feels that her stress is medium/high Current average weekly physical activity: Has been exercising less since she feels like she is not eating enough. Has a stationary bike and rides for 15 minutes 2x/week. She also goes walking around stores.   24-Hr Dietary Recall (previous visit) First Meal: Fruit salad (blueberries, strawberries, peach) with almond yogurt and nuts OR gluten free bread and vegetarian sausage OR cheese or grilled chicken biscuit from [place] OR chickpea scramble OR GF toast, vegan cream cheese, peppers, pear Snack: none OR trail mix Second Meal: homemade vegetable soup, stir fried green beans Snack: fruit (plums) OR nuts OR homemade walnut butter with GF bread Third Meal: pinto beans OR salmon patty sandwich Snack: none OR nuts OR occasional plantains OR non-dairy [employer].  Beverages: water, ginger tea  Estimated Energy Needs Calories: 2000  Protein: 55-66 g  NUTRITION DIAGNOSIS  NB-1.1 Food  and nutrition-related knowledge deficit As related to multiple food allergies, CKD and other concerns.  As evidenced by diet hx and patient report.   NUTRITION INTERVENTION  Nutrition education (E-1) on the following topics:  Simple  meal ideas to creat better balance Importance of adequate nutrition for fullness, to meet nutrient needs, and improve energy Resources for recipes Discussion of her food allergies and intolerances Grain sources that do not contain gluten/wheat   Handouts Provided Include (initial visit) Gluten free foods from [place] from AND Low Fodmap Diet -Fodmap foods from [facility] for Change Teaching method utilized: Visual & Auditory  Demonstrated degree of understanding via: Teach Back  Barriers to learning/adherence to lifestyle change: stress, increased health concerns and food intolerances  Goals Established by Pt Avoid Allergy test list Gluten free Vegan Journal - what and how much you eat, symptoms  Please bring the journal to your appointment.  Avoid erythritol or other sugar alcohols. Continue low sodium whole foods plant based  diet Simple meal planning Consider ways to be more active  Trainer:  Asli Water quality scientist  [phone] [place] at [place] or [employer] could also be an option  12 week Nutrition program and exercise classes at the [employer] [address] with Blanchie Dessert (she recently wrote a cookbook)  Some with Fodmap issues have used a product called Joneen Roach which helps tolerance of some but not all of the fodmap groups. [employer] is a good reference for anything Low Fodmap  MONITORING & EVALUATION Dietary intake, weekly physical activity, and follow-up in 2-3 months.  Next Steps  Patient is to call for questions.

## 2023-03-04 NOTE — Patient Instructions (Addendum)
Trainer:  Asli Water quality scientist  [phone] [place] at [place] or [employer] could also be an option  12 week Nutrition program and exercise classes at the [facility] [address] with Blanchie Dessert (she recently wrote a cookbook)  Some with Fodmap issues have used a product called Joneen Roach which helps tolerance of some but not all of the fodmap groups. [employer] is a good reference for anything Low Fodmap  Avoid Allergy test Gluten free Vegan Journal - what and how much you eat, symptoms  Please bring the journal to your appointment.  Avoid erythritol or other sugar alcohols.

## 2023-03-19 ENCOUNTER — Ambulatory Visit (HOSPITAL_BASED_OUTPATIENT_CLINIC_OR_DEPARTMENT_OTHER)
Admission: RE | Admit: 2023-03-19 | Discharge: 2023-03-19 | Disposition: A | Discharge status: Home / Self Care | Payer: BC Managed Care – PPO | Source: Ambulatory Visit | Attending: Internal Medicine | Admitting: Internal Medicine

## 2023-03-19 DIAGNOSIS — E785 Hyperlipidemia, unspecified: Secondary | ICD-10-CM | POA: Insufficient documentation

## 2023-03-22 ENCOUNTER — Ambulatory Visit: Payer: BC Managed Care – PPO | Admitting: Internal Medicine

## 2023-03-29 ENCOUNTER — Ambulatory Visit: Payer: Self-pay | Admitting: Internal Medicine

## 2023-03-31 ENCOUNTER — Other Ambulatory Visit: Payer: Self-pay | Admitting: Internal Medicine

## 2023-03-31 DIAGNOSIS — Z1231 Encounter for screening mammogram for malignant neoplasm of breast: Secondary | ICD-10-CM

## 2023-04-01 ENCOUNTER — Telehealth: Payer: Self-pay | Admitting: Dietician

## 2023-04-01 NOTE — Telephone Encounter (Signed)
Message received that patient needs meal planning. Returned patient call.  Patient was not available.  Left message that patient should call our office to arrange an appointment for follow up.  Oran Rein, RD, LDN, CDCES

## 2023-04-02 ENCOUNTER — Telehealth: Payer: Self-pay

## 2023-04-02 ENCOUNTER — Ambulatory Visit: Payer: BC Managed Care – PPO | Admitting: Dietician

## 2023-04-02 NOTE — Telephone Encounter (Signed)
Call to pt to offer next PREP class starting 4/30 T/TH 1p-215p.  Pt sts that likely an evening class would be best. Explained do not know when the next one will be.  Pt requested to consider overweekend and let me know.

## 2023-04-13 ENCOUNTER — Ambulatory Visit (HOSPITAL_BASED_OUTPATIENT_CLINIC_OR_DEPARTMENT_OTHER): Payer: BC Managed Care – PPO | Admitting: Cardiovascular Disease

## 2023-05-05 ENCOUNTER — Other Ambulatory Visit (HOSPITAL_BASED_OUTPATIENT_CLINIC_OR_DEPARTMENT_OTHER): Payer: Self-pay | Admitting: Cardiovascular Disease

## 2023-05-05 NOTE — Telephone Encounter (Signed)
Rx request sent to pharmacy.  

## 2023-05-06 ENCOUNTER — Ambulatory Visit: Payer: BC Managed Care – PPO

## 2023-05-21 ENCOUNTER — Ambulatory Visit
Admission: RE | Admit: 2023-05-21 | Discharge: 2023-05-21 | Disposition: A | Discharge status: Home / Self Care | Payer: BC Managed Care – PPO | Source: Ambulatory Visit | Attending: Internal Medicine | Admitting: Internal Medicine

## 2023-05-21 DIAGNOSIS — Z1231 Encounter for screening mammogram for malignant neoplasm of breast: Secondary | ICD-10-CM

## 2023-07-01 ENCOUNTER — Telehealth: Payer: Self-pay

## 2023-07-01 NOTE — Telephone Encounter (Signed)
Call from pt. Is interested in doing evening PREP class.  Can do intake on 07/05/23 at 430p at [facility].  Will meet her in the lobby

## 2023-07-05 NOTE — Progress Notes (Signed)
[facility] PRE[facility] Evaluation  Patient Details  Name: Heidi Herrera MRN: [id number] Date of Birth: Apr 09, 1966 Age: 57 y.o. PCP: Heidi Devon, MD  Vitals:   07/05/23 1645  BP: (!) 158/98  Pulse: 68  SpO2: 98%  Weight: 187 lb 3.2 oz (84.9 kg)     [facility] Eval - 07/05/23 1600       [facility] "PREP" Location   [facility] "PREP" Location [facility]      Referral    Referring Provider Heidi Herrera    Reason for referral Hypertension;Inactivity    Program Start Date 07/06/23   T/Th 6p-715p x 12 wks     Measurement   Waist Circumference 39 inches    Hip Circumference 41 inches    Body fat 39.5 percent      Information for Trainer   Goals less sob when walking, get BP down    Current Exercise none    Orthopedic Concerns Neck DDD, LBP    Pertinent Medical History HTN, Migraines, GERD, CKD3    Current Barriers none    Medications that affect exercise Medication causing dizziness/drowsiness;Beta blocker      Timed Up and Go (TUGS)   Timed Up and Go Low risk <9 seconds      Mobility and Daily Activities   I find it easy to walk up or down two or more flights of stairs. 4    I have no trouble taking out the trash. 4    I do housework such as vacuuming and dusting on my own without difficulty. 4    I can easily lift a gallon of milk (8lbs). 4    I can easily walk a mile. 2    I have no trouble reaching into high cupboards or reaching down to pick up something from the floor. 2    I do not have trouble doing out-door work such as Loss adjuster, chartered, raking leaves, or gardening. 2      Mobility and Daily Activities   I feel younger than my age. 1    I feel independent. 3    I feel energetic. 2    I live an active life.  1    I feel strong. 2    I feel healthy. 2    I feel active as other people my age. 1      How fit and strong are you.   Fit and Strong Total Score 34            Past Medical History:  Diagnosis Date   Acid reflux    Allergy    Anemia    Anxiety    Fibroids     GERD (gastroesophageal reflux disease)    H. pylori infection    Hypertension    Kidney failure    stage 2   Migraine    Obesity    Polycystic kidney disease    Sebaceous cyst    Sessile colonic polyp    Vertigo    Past Surgical History:  Procedure Laterality Date   IR GENERIC HISTORICAL  12/01/2016   IR ANGIOGRAM PELVIS SELECTIVE OR SUPRASELECTIVE 12/01/2016 Heidi Come, MD [facility]   IR GENERIC HISTORICAL  12/01/2016   IR US GUIDE VASC ACCESS RIGHT 12/01/2016 Heidi Come, MD [facility]   IR GENERIC HISTORICAL  12/01/2016   IR EMBO ARTERIAL NOT HEMORR HEMANG INC GUIDE ROADMAPPING 12/01/2016 Heidi Come, MD [facility]   IR GENERIC HISTORICAL  12/01/2016   IR Rosalin Hawking  SELECTIVE EACH ADDITIONAL VESSEL 12/01/2016 Heidi Come, MD [facility]   IR GENERIC HISTORICAL  12/01/2016   IR ANGIOGRAM SELECTIVE EACH ADDITIONAL VESSEL 12/01/2016 Heidi Come, MD [facility]   IR GENERIC HISTORICAL  12/01/2016   IR ANGIOGRAM PELVIS SELECTIVE OR SUPRASELECTIVE 12/01/2016 Heidi Come, MD [facility]   IR GENERIC HISTORICAL  01/07/2017   IR RADIOLOGIST EVAL & MGMT 01/07/2017 Heidi Come, MD [facility]   IR RADIOLOGIST EVAL & MGMT  08/31/2017   LOBECTOMY  2009   MYOMECTOMY  2007   UTERINE FIBROID EMBOLIZATION     12/01/2016   Social History   Tobacco Use  Smoking Status Never  Smokeless Tobacco Never    Heidi Herrera 07/05/2023, 4:48 PM

## 2023-07-07 ENCOUNTER — Ambulatory Visit (HOSPITAL_BASED_OUTPATIENT_CLINIC_OR_DEPARTMENT_OTHER): Payer: BC Managed Care – PPO | Admitting: Cardiovascular Disease

## 2023-07-07 ENCOUNTER — Encounter (HOSPITAL_BASED_OUTPATIENT_CLINIC_OR_DEPARTMENT_OTHER): Payer: Self-pay | Admitting: Cardiovascular Disease

## 2023-07-07 VITALS — BP 186/92 | HR 62 | Ht 65.0 in | Wt 188.6 lb

## 2023-07-07 DIAGNOSIS — Z5181 Encounter for therapeutic drug level monitoring: Secondary | ICD-10-CM | POA: Diagnosis not present

## 2023-07-07 DIAGNOSIS — I2510 Atherosclerotic heart disease of native coronary artery without angina pectoris: Secondary | ICD-10-CM | POA: Insufficient documentation

## 2023-07-07 DIAGNOSIS — I7090 Unspecified atherosclerosis: Secondary | ICD-10-CM

## 2023-07-07 DIAGNOSIS — R002 Palpitations: Secondary | ICD-10-CM

## 2023-07-07 DIAGNOSIS — E7800 Pure hypercholesterolemia, unspecified: Secondary | ICD-10-CM

## 2023-07-07 DIAGNOSIS — I10 Essential (primary) hypertension: Secondary | ICD-10-CM

## 2023-07-07 DIAGNOSIS — N1831 Chronic kidney disease, stage 3a: Secondary | ICD-10-CM

## 2023-07-07 HISTORY — DX: Pure hypercholesterolemia, unspecified: E78.00

## 2023-07-07 HISTORY — DX: Palpitations: R00.2

## 2023-07-07 MED ORDER — ROSUVASTATIN CALCIUM 5 MG PO TABS: 5.0000 mg | ORAL_TABLET | Freq: Every day | ORAL | 3 refills | Status: DC

## 2023-07-07 MED ORDER — AMLODIPINE BESYLATE 2.5 MG PO TABS: 2.5000 mg | ORAL_TABLET | Freq: Every day | ORAL | 3 refills | Status: DC

## 2023-07-07 NOTE — Progress Notes (Signed)
[facility] Follow-up:    Date:  07/07/2023   ID:  Heidi Herrera, DOB Oct 25, 1966, MRN [id number]  PCP:  Andi Devon, MD  Cardiologist:  Chilton Si, MD  Nephrologist:  Referring MD: Marcine Matar, MD   CC: Hypertension  History of Present Illness:    Heidi Herrera is a 57 y.o. female with hypertension, aortic atherosclerosis, non-obstructive CAD, CKD 3, polycystic kidney disease, horseshoe kidney, and hyperlipidemia here for follow-up. She was initially seen 05/13/2022 to establish care.  She last saw Dr. Josiah Lobo 07/2021 and complained of frequent urination.  Lisinopril was increased while hydrochlorothiazide was reduced.  At that visit her blood pressure was 138/84.  She previously had a nuclear stress test 06/2021 that revealed LVEF 58% with no ischemia.  She saw her PCP 01/2022 and was no longer on HCTZ.  She had also self discontinued lisinopril and her blood pressure was 172/108.  It was recommended that she resume the lisinopril.  She followed up with the urologist and they started her on Vesicare for urinary frequency.     She saw a cardiologist at [facility] 01/2022 and reported palpitations.  She wore a 30-day event recorder which showed sinus tachycardia but no other significant arrhythmias.  Her BP was 168/98 and 154/84.  At the time she was only taking lisinopril.  She reported palpitations and had an event recorder that revealed sinus tachycardia in the middle of the night up to 140 bpm.  She was started on metoprolol succinate but still had some palpitations. Metoprolol was switched to carvedilol. She was enrolled in the remote patient monitoring study. She called the office stating that carvedilol was causing her to have palpitations. She also had concerns with her memory, and was advised to discuss with hear PCP. She saw [name], PharmD 07/2022 and noted a burning pain in her liver an hour after taking lisinopril. She was switched to valsartan.  It was noted that her blood pressure readings in the office were much higher than in [facility].    She had an MRI done for her cervical spine. She was told that she might need surgery but she has been looking into alternative options. She met with a chiropractor. They told her that her spine pressing on her nerves could be the reason of her vision, blood pressure, and digestive issues. The pain on her back and neck feels like a pressure weighing her down and she hopes that by going along with this chiropractic treatment she could resolve her other issues. At the last visit her blood pressure was higher in the office than on home monitoring. Her renin and aldosterone levels were indeterminate, and spironolactone was added. She called the office reporting side effects of spironolactone, and valsartan was increased. Blood pressures in [facility] for the last 2 weeks have ranged from the 120s-160s over 80s-100s. It has mostly been around 140/80s-90s. She had an MRI at [facility] that revealed innumerable cysts in bilateral kidneys, suggestive of polycystic kidney disease and a likely horseshoe kidney. She also had multiple hepatic cysts and uterine fibroids. She had no adrenal adenomas.   At her 11/2022 visit her blood pressure continued to be labile, averaging in the 130s-140s systolic. We recommended adding indapamide, but she wanted to increase valsartan. Blood pressures in [facility] were averaging in the 130s/90s.   In 01/2023 blood pressures were well controlled at home. She continued to work on her diet and exercise. We referred her to PREP. Given chronic neck and  back pain, also recommended water exercises. She noted increased abdominal bloating since she increased valsartan to twice daily.  She had also been diagnosed with gluten sensitivity. She planned to review her diet and focus on eliminating gluten before we make any changes to her medications. We also discussed renal denervation as a potential option. She  completed the RPM study and returned her patient monitoring device. She had a coronary CT 03/19/2023 revealing a coronary calcium score of 40.2 and borderline aortic dilation at 38 mm (non-contrast).  In the office today her blood pressure is 158/93, and was 186/92 on manual recheck. She affirms having a component of white coat hypertension. She presents a blood pressure log which is personally reviewed. She reports most of her readings have ranged from the mid 130s to 150s systolic. Averages 140s/80s-90s. Of note, she is unable to tolerate carvedilol and valsartan at the same time, as this seems to cause more frequent fluttering palpitations. She has found that she needs to wait at least 30-40 minutes after taking valsartan before taking the carvedilol. However, she sometimes still has heart flutters after eating, or occasionally throughout the day. She describes her typical palpitations as lasting for "one beat every so often". No associated lightheadedness or dizziness. Lately she has been feeling very fatigued. It is difficult for her to get started in the mornings. She wakes up at least 4 times in the middle of the night to use the restroom. Additionally she complains of more significant shortness of breath with climbing the stairs at home; this has been worsening in the past couple of months. Yesterday she completed her first day of PREP. She continues to follow a plant-based diet. We reviewed her most recent lipid panel which showed HDL 46, LDL 75. She does not smoke. However, she endorses second-hand smoke as she lives in a town home with a neighbor who does smoke. She expresses concern about memory issues. She states she is more forgetful, and has difficulty at times focusing on what she is doing. Instead, she will often jump between tasks. She denies any chest pain, peripheral edema, headaches, syncope, orthopnea, or PND.  Prior Antihypertensives: Irbesartan Spironolactone - urinary  hesitancy Amlodipine Eplerenone Hydralazine HCTZ Lisinopril Metoprolol  Past Medical History:  Diagnosis Date   Acid reflux    Allergy    Anemia    Anxiety    Fibroids    GERD (gastroesophageal reflux disease)    H. pylori infection    Hypertension    Kidney failure    stage 2   Migraine    Obesity    Palpitations 07/07/2023   Polycystic kidney disease    Pure hypercholesterolemia 07/07/2023   Sebaceous cyst    Sessile colonic polyp    Vertigo     Past Surgical History:  Procedure Laterality Date   IR GENERIC HISTORICAL  12/01/2016   IR ANGIOGRAM PELVIS SELECTIVE OR SUPRASELECTIVE 12/01/2016 Simonne Come, MD [facility]   IR GENERIC HISTORICAL  12/01/2016   IR US GUIDE VASC ACCESS RIGHT 12/01/2016 Simonne Come, MD [facility]   IR GENERIC HISTORICAL  12/01/2016   IR EMBO ARTERIAL NOT HEMORR HEMANG INC GUIDE ROADMAPPING 12/01/2016 Simonne Come, MD [facility]   IR GENERIC HISTORICAL  12/01/2016   IR ANGIOGRAM SELECTIVE EACH ADDITIONAL VESSEL 12/01/2016 Simonne Come, MD [facility]   IR GENERIC HISTORICAL  12/01/2016   IR ANGIOGRAM SELECTIVE EACH ADDITIONAL VESSEL 12/01/2016 Simonne Come, MD [facility]   IR GENERIC HISTORICAL  12/01/2016  IR ANGIOGRAM PELVIS SELECTIVE OR SUPRASELECTIVE 12/01/2016 Simonne Come, MD [facility]   IR GENERIC HISTORICAL  01/07/2017   IR RADIOLOGIST EVAL & MGMT 01/07/2017 Simonne Come, MD [facility]   IR RADIOLOGIST EVAL & MGMT  08/31/2017   LOBECTOMY  2009   MYOMECTOMY  2007   UTERINE FIBROID EMBOLIZATION     12/01/2016    Current Medications: Current Meds  Medication Sig   amLODipine (NORVASC) 2.5 MG tablet Take 1 tablet (2.5 mg total) by mouth daily.   b complex vitamins capsule Take 1 capsule by mouth daily.   Bacillus Coagulans-Inulin (ALIGN PREBIOTIC-PROBIOTIC PO) Take by mouth.   carvedilol (COREG) 12.5 MG tablet Take 1 tablet by mouth twice daily   Cholecalciferol (VITAMIN D3) 125 MCG (5000 UT) CAPS Take 1 capsule by  mouth daily.   Cyanocobalamin (VITAMIN B 12 PO) Take 1 tablet by mouth daily.   famotidine (PEPCID) 20 MG tablet Take 20 mg by mouth 2 (two) times daily.   Multiple Vitamin (MULTIVITAMIN WITH MINERALS) TABS tablet Take 1 tablet by mouth daily.   rosuvastatin (CRESTOR) 5 MG tablet Take 1 tablet (5 mg total) by mouth daily.   valsartan (DIOVAN) 40 MG tablet Take 1 tablet (40 mg total) by mouth 2 (two) times daily. TO BE TAKEN ALONG WITH THE 80 MG TABLETS FOR TOTAL OF 120 MG   valsartan (DIOVAN) 80 MG tablet Take 1 tablet (80 mg total) by mouth 2 (two) times daily. TO BE TAKEN ALONG WITH THE 40 MG TABLETS FOR TOTAL OF 120 MG     Allergies:   Bystolic [nebivolol hcl], Amlodipine, Biaxin [clarithromycin], Eplerenone, Hydralazine, Peanut-containing drug products, Aspartame, and Erythromycin base   Social History   Socioeconomic History   Marital status: Divorced    Spouse name: Not on file   Number of children: 1   Years of education: Not on file   Highest education level: Master's degree (e.g., MA, MS, MEng, MEd, MSW, MBA)  Occupational History    Comment: [employer]  Tobacco Use   Smoking status: Never   Smokeless tobacco: Never  Vaping Use   Vaping status: Never Used  Substance and Sexual Activity   Alcohol use: Not Currently    Alcohol/week: 1.0 standard drink of alcohol    Types: 1 Glasses of wine per week    Comment: quit years ago   Drug use: No   Sexual activity: Not on file  Other Topics Concern   Not on file  Social History Narrative   Lives alone   No caffiene   Right handed   Two story and steps inside   Social Determinants of Health   Financial Resource Strain: Low Risk  (05/13/2022)   Overall Financial Resource Strain (CARDIA)    Difficulty of Paying Living Expenses: Not hard at all  Food Insecurity: No Food Insecurity (05/13/2022)   Hunger Vital Sign    Worried About Running Out of Food in the Last Year: Never true    Ran Out of Food in the Last Year: Never true   Transportation Needs: No Transportation Needs (05/13/2022)   PRAPARE - Administrator, Civil Service (Medical): No    Lack of Transportation (Non-Medical): No  Physical Activity: Inactive (05/13/2022)   Exercise Vital Sign    Days of Exercise per Week: 0 days    Minutes of Exercise per Session: 0 min  Stress: Stress Concern Present (03/05/2022)   Received from [facility], [facility]   [place]  [employer] [employer] [facility] - Occupational Stress Questionnaire    Feeling of Stress : To some extent  Social Connections: Unknown (03/25/2023)   Received from [facility], [facility]   Social Network    Social Network: Not on file     Family History: The patient's family history includes Cancer in her brother; Cancer (age of onset: 56) in her sister; Diabetes in her mother, sister, and sister; Esophageal cancer in her maternal grandfather; Glaucoma in her brother, maternal uncle, and sister; Heart attack in her sister; Heart disease in her maternal uncle; Hyperlipidemia in her sister; Hypertension in her brother, father, mother, sister, and sister; Kidney Stones in her mother; Mental illness in her father, sister, and sister; Obesity in her brother, mother, and sister; Pancreatic cancer in her cousin; Stroke in her brother, brother, and mother. There is no history of Stomach cancer or Colon cancer.  ROS:   Please see the history of present illness.    (+) Palpitations (+) Fatigue (+) Shortness of breath (+) Nocturia All other systems reviewed and are negative.  EKGs/Labs/Other Studies Reviewed:    CT Cardiac Scoring  03/19/2023: IMPRESSION: Coronary calcium score of 40.2. This was 88th percentile for age-, race-, and sex-matched controls.   Aorta: Borderline dilated at 38 mm (non-contrast), level of the main PA bifurcation.  MRI Abdomen  11/18/2022  [facility]): IMPRESSION:  1. Enlarged bilateral kidneys with innumerable cysts essentially replacing the renal  parenchyma suggesting polycystic kidney disease. Suggestion of a horseshoe kidney.  2.  Multiple subcentimeter hepatic cysts.  3.  Gallbladder sludge.  4.  Uterine fibroids.  5.  Mild pelvic fluid.    Echo 05/26/2022:  IMPRESSIONS   1. Left ventricular ejection fraction, by estimation, is 60 to 65%. The  left ventricle has normal function. The left ventricle has no regional  wall motion abnormalities. There is mild concentric left ventricular  hypertrophy. Left ventricular diastolic  parameters are indeterminate, but likely with impaired relaxation.   2. Right ventricular systolic function is normal. The right ventricular  size is normal.   3. Left atrial size was moderately dilated.   4. The mitral valve is normal in structure. Trivial mitral valve  regurgitation.   5. The aortic valve is tricuspid. Aortic valve regurgitation is trivial.  No aortic stenosis is present.   6. The inferior vena cava is normal in size with greater than 50%  respiratory variability, suggesting right atrial pressure of 3 mmHg.    Monitor 02/2022 [facility]): 1.  Normal sinus rhythm predominates recording 2.  PACs and PAC burst are noted.  None of these are sustained 3.  Sinus tachycardia is noted during daytime activity, sinus bradycardia is noted during evening hours 4.  No significant arrhythmias are noted otherwise.    Exercise Myoview 07/11/2021: The left ventricular ejection fraction is normal (55-65%). Nuclear stress EF: 58%. There was no ST segment deviation noted during stress. No T wave inversion was noted during stress. The study is normal. This is a low risk study.   1.  Normal myocardial perfusion imaging study without evidence of ischemia or infarction. 2.  Normal LVEF, 58%. 3.  Excellent functional capacity (10:31 min:s; 12.5 METS).  4.  Normal heart rate and blood pressure response to exercise. 5.  This is a low risk study.   Echo 05/2021:  LVEF 60-65%  EKG:  EKG is personally  reviewed. 07/07/2023: Not ordered. 02/01/2023:  Sinus rhythm. Rate 61 bpm. 11/25/2022:  EKG was not ordered.  09/21/2022: EKG was not ordered. 05/13/2022: Sinus rhythm.  Rate 69 bpm  Recent Labs: 09/28/2022: BUN 11; Creatinine, Ser 1.15; Potassium 4.7; Sodium 144   Recent Lipid Panel    Component Value Date/Time   CHOL 176 08/06/2021 0853   TRIG 63 08/06/2021 0853   HDL 42 08/06/2021 0853   CHOLHDL 4.2 08/06/2021 0853   CHOLHDL 4.1 01/05/2014 0823   VLDL 12 01/05/2014 0823   LDLCALC 122 (H) 08/06/2021 0853   LDLDIRECT 148.8 11/26/2008 1223    Physical Exam:    VS:  BP (!) 186/92 (BP Location: Right Arm, Patient Position: Sitting, Cuff Size: Normal)   Pulse 62   Ht 5\' 5"  (1.651 m)   Wt 188 lb 9.6 oz (85.5 kg)   BMI 31.38 kg/m  , BMI Body mass index is 31.38 kg/m. GENERAL:  Well appearing HEENT: Pupils equal round and reactive, fundi not visualized, oral mucosa unremarkable NECK:  No jugular venous distention, waveform within normal limits, carotid upstroke brisk and symmetric, no bruits, no thyromegaly LUNGS:  Clear to auscultation bilaterally HEART:  RRR.  PMI not displaced or sustained, S1 and S2 within normal limits, no S3, no S4, no clicks, no rubs, no murmurs ABD:  Flat, positive bowel sounds normal in frequency in pitch, no bruits, no rebound, no guarding, no midline pulsatile mass, no hepatomegaly, no splenomegaly EXT:  2 plus pulses throughout, no edema, no cyanosis, no clubbing SKIN:  No rashes, no nodules NEURO:  Cranial nerves II through XII grossly intact, motor grossly intact throughout PSYCH:  Cognitively intact, oriented to person place and time   ASSESSMENT/PLAN:    # Hypertension: Blood pressure readings ranging from 130s to 150s despite adherence to carvedilol and valsartan. Patient reports palpitations and fatigue, possibly related to medication. -Change carvedilol to nebivolol 10mg  daily.  Hopefully this will help her fatigue.  She hasn't tolerated  metoprolol int he past either. -Add amlodipine 2.5mg  daily. -Recheck blood pressure in one month.  Refer for KARDIA-3 if BP remains uncontrolled.   # CAD on Coronary calcium score:  # Hyperlipidemia:  LDL cholesterol above goal (105 mg/dL) despite dietary modifications. Patient has a high calcium score (88th percentile). -Start rosuvastatin 5mg  daily. -Recheck comprehensive metabolic panel and fasting lipids in 2-3 months.  LDL goal <70.  #Sleep disturbance:  #Nocturia: Patient reports waking up four to five times per night, primarily to urinate. This may be contributing to daytime fatigue.  No indication of DM on Hbg A1c.  # Cognitive concerns: Patient reports increased forgetfulness and difficulty focusing. These symptoms are not likely related to current medications.  aMay be related to poor sleeping patterns.  Consider neurology referral if persistent.   # Shortness of breath on exertion: This symptom has been present for the past couple of months. It may be related to medication, deconditioning, or potentially cardiac issues given the patient's high calcium score. -Monitor symptom progression during exercise class. -Consider further cardiac testing if symptoms persist or worsen.      Addendum: Patient has h/o lip swelling on nebivolol.  Continue carvedilol.   Screening for Secondary Hypertension:     05/13/2022    9:50 AM  Causes  Drugs/Herbals Screened     - Comments plant based.  minimizes sodium.  no caffeine, etOH  Sleep Apnea Screened     - Comments check sleep study  Thyroid Disease Screened    Relevant Labs/Studies:    Latest Ref Rng & Units 09/28/2022    1:23 PM 08/06/2021  8:53 AM 07/09/2021   11:19 AM  Basic Labs  Sodium 134 - 144 mmol/L 144  142  148   Potassium 3.5 - 5.2 mmol/L 4.7  4.8  4.6   Creatinine 0.57 - 1.00 mg/dL 8.84  1.66  0.63        Latest Ref Rng & Units 06/04/2021    2:57 AM 11/20/2016    1:10 PM  Thyroid   TSH 0.350 - 4.500 uIU/mL 2.543   2.070       Disposition:    FU with [facility] Hypertension Clinic in 1 month.  Medication Adjustments/Labs and Tests Ordered: Current medicines are reviewed at length with the patient today.  Concerns regarding medicines are outlined above.   Orders Placed This Encounter  Procedures   Lipid panel   Comprehensive metabolic panel   Meds ordered this encounter  Medications   rosuvastatin (CRESTOR) 5 MG tablet    Sig: Take 1 tablet (5 mg total) by mouth daily.    Dispense:  90 tablet    Refill:  3   amLODipine (NORVASC) 2.5 MG tablet    Sig: Take 1 tablet (2.5 mg total) by mouth daily.    Dispense:  90 tablet    Refill:  3   I,Mathew Stumpf,acting as a scribe for Chilton Si, MD.,have documented all relevant documentation on the behalf of Chilton Si, MD,as directed by  Chilton Si, MD while in the presence of Chilton Si, MD.  I, Shernell Saldierna C. Duke Salvia, MD have reviewed all documentation for this visit.  The documentation of the exam, diagnosis, procedures, and orders on 07/07/2023 are all accurate and complete.   Signed, Chilton Si, MD  07/07/2023 6:39 PM    [facility]

## 2023-07-07 NOTE — Patient Instructions (Addendum)
Medication Instructions:  START AMLODIPINE 2.5 MG DAILY   START ROSUVASTATIN 5  MG DAILY   Labwork: FASTING LP/CMET IN 2-3 MONTHS   Testing/Procedures: NONE  Follow-Up: 1 MONTH WITH PHARM D, DR Bryson, OR CAITLIN W NP    Any Other Special Instructions Will Be Listed Below (If Applicable). RESEARCH WILL REACH OUT TO YOU REGARDING THE UPCOMING STUDY    If you need a refill on your cardiac medications before your next appointment, please call your pharmacy.

## 2023-07-14 NOTE — Progress Notes (Signed)
[facility] PREP Weekly Session  Patient Details  Name: Heidi Herrera MRN: [id number] Date of Birth: 08-15-1966 Age: 57 y.o. PCP: Andi Devon, MD  Vitals:   07/13/23 1800  Weight: 183 lb 12.8 oz (83.4 kg)     [facility] Weekly seesion - 07/14/23 0900       [facility] "PREP" Location   [facility] "PREP" Location [facility]      Weekly Session   Topic Discussed Importance of resistance training;Other ways to be active    Minutes exercised this week 75 minutes    Classes attended to date 3             [name] 07/14/2023, 9:32 AM

## 2023-07-21 NOTE — Progress Notes (Signed)
[facility] PREP Weekly Session  Patient Details  Name: Heidi Herrera MRN: [id number] Date of Birth: 08-29-1966 Age: 57 y.o. PCP: Andi Devon, MD  Vitals:   07/20/23 1800  Weight: 185 lb 6.4 oz (84.1 kg)     [facility] Weekly seesion - 07/21/23 1000       [facility] "PREP" Location   [facility] "PREP" Location [facility]      Weekly Session   Topic Discussed Healthy eating tips    Minutes exercised this week 205 minutes    Classes attended to date 5             Pam Jerral Bonito 07/21/2023, 10:06 AM

## 2023-07-26 ENCOUNTER — Telehealth (HOSPITAL_BASED_OUTPATIENT_CLINIC_OR_DEPARTMENT_OTHER): Payer: Self-pay | Admitting: Cardiovascular Disease

## 2023-07-26 NOTE — Telephone Encounter (Signed)
If she wishes can stop Carvedilol and add Doxazosin 2mg  at bedtime.   Needs to give medicine change at least 7 days to know true effects.   Alver Sorrow, NP

## 2023-07-26 NOTE — Telephone Encounter (Signed)
Returned call to patient,   Patient is allergic to Bystolic. She said she was previously recommended this and will not try again. Recommendations regarding amlodipine given but patient still wants to stop it. Rx updated. Will route back to provider for further review.     "Per Dr. Duke Salvia last note,    "-Change carvedilol to nebivolol 10mg  daily.  Hopefully this will help her fatigue.  She hasn't tolerated metoprolol int he past either. -Add amlodipine 2.5mg  daily. -Recheck blood pressure in one month.  Refer for KARDIA-3 if BP remains uncontrolled."\   She was started on a very low dose of Amlodipine. Amlodipine unlikely to cause vision blurring or fatigue.   Carvedilol may cause fatigue. Recommend transition from Carvedilol to Nebivolol 10mg  daily to help with fatigue.    Alver Sorrow, NP"

## 2023-07-26 NOTE — Telephone Encounter (Signed)
Returned call to patient,   Patient states she is concerned that she is having side effects from the medication. She states her vision is blurry and she is feeling extremely fatigued. She states this started the day after she started taking it again.   BP 130s/90s- she states it has not gone up or down since starting the medications.   She states the last time she saw Dr. Duke Salvia she states that Dr. Duke Salvia told her the Carvedilol was making her tired and she was wants to revisit the conversation about coming off these meds and trying something else.

## 2023-07-26 NOTE — Telephone Encounter (Signed)
Returned call to patient,   Reviewed recommendations with patient, she is convinced that she has also taken this medication before. She is worried that this medication is not also for irregular heart rate. Advsed patient that she would need to decide if she wants to come off the carvedilol or not. Advised patient she could try new medication until her upcoming appointment with PharmD, patient does not want to see pharmd she only wants to see Dr. Duke Salvia. August appointment cancelled and patient put in next avail appointment in HTN clinic with Dr. Duke Salvia in October. Advised this will make her overdue and her blood pressure is not yet controlled, she states " my blood pressure is controlled they just want it lower" Call ended.     "If she wishes can stop Carvedilol and add Doxazosin 2mg  at bedtime.    Needs to give medicine change at least 7 days to know true effects.    Alver Sorrow, NP"

## 2023-07-26 NOTE — Telephone Encounter (Signed)
Pt c/o medication issue:  1. Name of Medication:   amLODipine (NORVASC) 2.5 MG tablet   2. How are you currently taking this medication (dosage and times per day)?  As prescribed  3. Are you having a reaction (difficulty breathing--STAT)?   4. What is your medication issue?   Patient is concerned about the side effects she having with this medication.

## 2023-07-26 NOTE — Telephone Encounter (Signed)
Per Dr. Duke Salvia last note,   "-Change carvedilol to nebivolol 10mg  daily.  Hopefully this will help her fatigue.  She hasn't tolerated metoprolol int he past either. -Add amlodipine 2.5mg  daily. -Recheck blood pressure in one month.  Refer for KARDIA-3 if BP remains uncontrolled."\  She was started on a very low dose of Amlodipine. Amlodipine unlikely to cause vision blurring or fatigue.  Carvedilol may cause fatigue. Recommend transition from Carvedilol to Nebivolol 10mg  daily to help with fatigue.   Alver Sorrow, NP

## 2023-07-29 NOTE — Progress Notes (Signed)
[facility] PRE[facility] Weekly Session  Patient Details  Name: Heidi Herrera MRN: [id number] Date of Birth: [birth year] Age: 58 y.o. PCP: Andi Devon, MD  Vitals:   07/27/23 1800  Weight: 185 lb (83.9 kg)     [facility] Weekly seesion - 07/29/23 1600       [facility]PRE[facility]" Location   [facility]      Weekly Session   Topic Discussed Health habits    Minutes exercised this week 160 minutes    Classes attended to date 7             [name] 07/29/2023, 4:36 PM

## 2023-08-05 NOTE — Progress Notes (Signed)
[facility] PRE[facility] Weekly Session  Patient Details  Name: Heidi Herrera MRN: [id number] Date of Birth: 04-29-1966 Age: 57 y.o. PCP: Andi Devon, MD  Vitals:   08/03/23 1800  Weight: 184 lb 9.6 oz (83.7 kg)     [facility] Weekly seesion - 08/05/23 1700       [facility] Location   [facility]      Weekly Session   Topic Discussed Restaurant Eating    Minutes exercised this week 165 minutes    Classes attended to date 8             [name] 08/05/2023, 5:09 PM

## 2023-08-09 ENCOUNTER — Ambulatory Visit: Payer: BC Managed Care – PPO

## 2023-08-11 NOTE — Progress Notes (Signed)
[facility] PRE[facility] Weekly Session  Patient Details  Name: Heidi Herrera MRN: [id number] Date of Birth: Nov 14, 1966 Age: 57 y.o. PCP: Andi Devon, MD  Vitals:   08/10/23 1800  Weight: 184 lb 9.6 oz (83.7 kg)     [facility] Weekly seesion - 08/11/23 1400       [facility] Location   [facility]      Weekly Session   Topic Discussed Stress management and problem solving   Progressive meditation   Minutes exercised this week 90 minutes    Classes attended to date 38             Pam Jerral Bonito 08/11/2023, 2:39 PM

## 2023-08-18 NOTE — Progress Notes (Signed)
[facility] PRE[facility] Weekly Session  Patient Details  Name: Heidi Herrera MRN: [id number] Date of Birth: 1966/02/05 Age: 57 y.o. PCP: Andi Devon, MD  Vitals:   08/17/23 1800  Weight: 184 lb 12.8 oz (83.8 kg)     [facility] Weekly seesion - 08/18/23 0900       [facility]PREP[facility] Location   [facility]      Weekly Session   Topic Discussed Expectations and non-scale victories    Minutes exercised this week 230 minutes    Classes attended to date 13             Pam Jerral Bonito 08/18/2023, 9:50 AM

## 2023-08-25 NOTE — Progress Notes (Signed)
[facility] PRE[facility] Weekly Session  Patient Details  Name: Heidi Herrera MRN: [id number] Date of Birth: [birth year] Age: 57 y.o. PCP: Andi Devon, MD  Vitals:   08/24/23 1800  Weight: 184 lb 6.4 oz (83.6 kg)     [facility] Weekly seesion - 08/25/23 1400       [facility] Location   [facility]      Weekly Session   Topic Discussed Other   portions   Minutes exercised this week 75 minutes    Classes attended to date 27             Pam Jerral Bonito 08/25/2023, 2:30 PM

## 2023-09-08 NOTE — Progress Notes (Signed)
[facility] PRE[facility] Weekly Session  Patient Details  Name: Heidi Herrera MRN: [id number] Date of Birth: September 25, 1966 Age: 57 y.o. PCP: Andi Devon, MD  Vitals:   09/07/23 1800  Weight: 182 lb 6.4 oz (82.7 kg)     [facility] Weekly seesion - 09/08/23 1300       [facility]PRE[facility] Location   [facility]      Weekly Session   Topic Discussed Finding support    Minutes exercised this week 260 minutes    Classes attended to date 17             Pam Jerral Bonito 09/08/2023, 1:10 PM

## 2023-09-10 ENCOUNTER — Ambulatory Visit (HOSPITAL_BASED_OUTPATIENT_CLINIC_OR_DEPARTMENT_OTHER): Payer: BC Managed Care – PPO | Admitting: Family

## 2023-09-15 NOTE — Telephone Encounter (Signed)
September 15, 2023 Chilton Si, MD  to Marlene Lard, RN     09/15/23  4:22 PM Will discuss in clinic.  TC

## 2023-09-15 NOTE — Progress Notes (Signed)
[facility] PRE[facility] Weekly Session  Patient Details  Name: Heidi Herrera MRN: [id number] Date of Birth: 1966-04-15 Age: 57 y.o. PCP: Andi Devon, MD  Vitals:   09/14/23 1800  Weight: 182 lb 9.6 oz (82.8 kg)     [facility] Weekly seesion - 09/15/23 0800       [facility]PREP[facility] Location   [facility]      Weekly Session   Topic Discussed Calorie breakdown    Minutes exercised this week 210 minutes    Classes attended to date 19             [facility] 09/15/2023, 8:58 AM

## 2023-09-22 NOTE — Progress Notes (Signed)
[facility] PREP Weekly Session  Patient Details  Name: Heidi Herrera MRN: [id number] Date of Birth: 01/27/1966 Age: 57 y.o. PCP: Andi Devon, MD  Vitals:   09/21/23 1800  Weight: 184 lb (83.5 kg)     [facility] Weekly seesion - 09/22/23 0900       [facility]PREP[facility] Location   [facility]      Weekly Session   Topic Discussed Hitting roadblocks    Minutes exercised this week 170 minutes    Classes attended to date 20             [name] 09/22/2023, 9:32 AM

## 2023-09-29 ENCOUNTER — Encounter (HOSPITAL_BASED_OUTPATIENT_CLINIC_OR_DEPARTMENT_OTHER): Payer: Self-pay | Admitting: Cardiovascular Disease

## 2023-09-29 ENCOUNTER — Ambulatory Visit (HOSPITAL_BASED_OUTPATIENT_CLINIC_OR_DEPARTMENT_OTHER): Payer: BC Managed Care – PPO | Admitting: Cardiovascular Disease

## 2023-09-29 VITALS — BP 150/84 | HR 55 | Ht 65.0 in | Wt 182.1 lb

## 2023-09-29 DIAGNOSIS — I2510 Atherosclerotic heart disease of native coronary artery without angina pectoris: Secondary | ICD-10-CM

## 2023-09-29 DIAGNOSIS — I700 Atherosclerosis of aorta: Secondary | ICD-10-CM | POA: Diagnosis not present

## 2023-09-29 DIAGNOSIS — I10 Essential (primary) hypertension: Secondary | ICD-10-CM | POA: Diagnosis not present

## 2023-09-29 MED ORDER — CLONIDINE HCL 0.1 MG PO TABS: ORAL_TABLET | ORAL | 5 refills | Status: DC

## 2023-09-29 NOTE — Patient Instructions (Signed)
Medication Instructions:  TAKE VALSARTAN 40 MG TWICE A DAY   START CLONIDINE 0.1 MG 1/2 TO 1 TABLET TWICE A DAY   Labwork: NONE  Testing/Procedures: NONE  Follow-Up: 2 MONTHS IN ADV HTN CLINIC   Any Other Special Instructions Will Be Listed Below (If Applicable). MONITOR YOUR BLOOD PRESSURE AND BRING YOUR READINGS TO YOUR FOLLOW UP APPOINTMENT    If you need a refill on your cardiac medications before your next appointment, please call your pharmacy.

## 2023-09-29 NOTE — Progress Notes (Signed)
[facility] Follow-up:    Date:  09/29/2023   ID:  Heidi Herrera, DOB 1966-05-13, MRN [id number]  PCP:  Heidi Devon, MD  Cardiologist:  Heidi Si, MD  Nephrologist:  Referring MD: Heidi Devon, MD   CC: Hypertension  History of Present Illness:    Heidi Herrera is a 57 y.o. female with hypertension, aortic atherosclerosis, non-obstructive CAD, CKD 3, polycystic kidney disease, horseshoe kidney, and hyperlipidemia, here for follow-up. She was initially seen 05/13/2022 to establish care.  She last saw Dr. Josiah Herrera 07/2021 and complained of frequent urination.  Lisinopril was increased while hydrochlorothiazide was reduced.  At that visit her blood pressure was 138/84.  She previously had a nuclear stress test 06/2021 that revealed LVEF 58% with no ischemia.  She saw her PCP 01/2022 and was no longer on HCTZ.  She had also self discontinued lisinopril and her blood pressure was 172/108.  It was recommended that she resume the lisinopril.  She followed up with the urologist and they started her on Vesicare for urinary frequency.     She saw a cardiologist at [facility] 01/2022 and reported palpitations.  She wore a 30-day event recorder which showed sinus tachycardia but no other significant arrhythmias.  Her BP was 168/98 and 154/84.  At the time she was only taking lisinopril.  She reported palpitations and had an event recorder that revealed sinus tachycardia in the middle of the night up to 140 bpm.  She was started on metoprolol succinate but still had some palpitations. Metoprolol was switched to carvedilol. She was enrolled in the remote patient monitoring study. She called the office stating that carvedilol was causing her to have palpitations. She also had concerns with her memory, and was advised to discuss with hear PCP. She saw Heidi Herrera, PharmD 07/2022 and noted a burning pain in her liver an hour after taking lisinopril. She was switched to valsartan.  It was noted that her blood pressure readings in the office were much higher than in [facility].    She had an MRI done for her cervical spine. She was told that she might need surgery but she has been looking into alternative options. She met with a chiropractor. They told her that her spine pressing on her nerves could be the reason of her vision, blood pressure, and digestive issues. The pain on her back and neck feels like a pressure weighing her down and she hopes that by going along with this chiropractic treatment she could resolve her other issues. At the last visit her blood pressure was higher in the office than on home monitoring. Her renin and aldosterone levels were indeterminate, and spironolactone was added. She called the office reporting side effects of spironolactone, and valsartan was increased. Blood pressures in [facility] for the last 2 weeks have ranged from the 120s-160s over 80s-100s. It has mostly been around 140/80s-90s. She had an MRI at [facility] that revealed innumerable cysts in bilateral kidneys, suggestive of polycystic kidney disease and a likely horseshoe kidney. She also had multiple hepatic cysts and uterine fibroids. She had no adrenal adenomas.   At her 11/2022 visit her blood pressure continued to be labile, averaging in the 130s-140s systolic. We recommended adding indapamide, but she wanted to increase valsartan. Blood pressures in [facility] were averaging in the 130s/90s.   In 01/2023 blood pressures were well controlled at home. She continued to work on her diet and exercise. We referred her to PREP. Given chronic neck and back  pain, also recommended water exercises. She noted increased abdominal bloating since she increased valsartan to twice daily.  She had also been diagnosed with gluten sensitivity. She planned to review her diet and focus on eliminating gluten before we make any changes to her medications. We also discussed renal denervation as a potential option. She  completed the RPM study and returned her patient monitoring device. She had a coronary CT 03/19/2023 revealing a coronary calcium score of 40.2 and borderline aortic dilation at 38 mm (non-contrast).  At her visit 06/2023, she reported home blood pressures ranging from 130s-150s systolic, averaging 140s/80s-90s. She had been unable to tolerate carvedilol and valsartan at the same time, as this seemed to cause more frequent fluttering palpitations. She found that she needs to wait at least 30-40 minutes after taking valsartan before taking the carvedilol. However, she still had occasional post-prandial palpitations, or randomly throughout the day. She complained of worsening shortness of breath with climbing her stairs at home. She was participating in the PREP program. We changed her carvedilol to nebivolol 10 mg daily, however she reported a history of angioedema on nebivolol so carvedilol was continued. Added amlodipine 2.5 mg daily and started rosuvastatin 5 mg daily. She complained of blurry vision and fatigue and wished to stop the amlodipine as well. It was recommended to try doxazosin 2 mg at bedtime but she declined as she reported taking this in the past.   Today, she is mainly concerned about ongoing short-term memory issues and now has more difficulty with functions such as problem solving. Initially she noticed these symptoms about 4-5 months ago, but they seem to be worsening. Additionally she states that her sister has a history of dementia. In the office her blood pressure is 144/86 initially, and 150/84 on manual recheck. She presents a BP log which is personally reviewed and shows 110s-130s/70s-80s on average. While on carvedilol she denies any recurring palpitations. She reports that her weight was 177 lbs at home this morning (182 lbs in the office); she believes that the valsartan has been causing her to feel bloated. Lately she has been exercising routinely, three times a week. She has been  feeling better during exercise with less fatigue (more noticeable on days she does not exercise). Although she is generally sleeping better, she continues to have difficulty with waking up in the mornings and starting her day. She also complains of frequent nocturia, especially 1-2 hours after taking her medications. She confirms that she has not tried starting the rosuvastatin 5 mg daily. She denies any chest pain, shortness of breath, peripheral edema, lightheadedness, headaches, syncope, orthopnea, or PND.  Prior Antihypertensives: Irbesartan Spironolactone - urinary hesitancy Amlodipine Eplerenone Hydralazine HCTZ Lisinopril Metoprolol Nebivolol - angioedema  Past Medical History:  Diagnosis Date   Acid reflux    Allergy    Anemia    Anxiety    Fibroids    GERD (gastroesophageal reflux disease)    H. pylori infection    Hypertension    Kidney failure    stage 2   Migraine    Obesity    Palpitations 07/07/2023   Polycystic kidney disease    Pure hypercholesterolemia 07/07/2023   Sebaceous cyst    Sessile colonic polyp    Vertigo     Past Surgical History:  Procedure Laterality Date   IR GENERIC HISTORICAL  12/01/2016   IR ANGIOGRAM PELVIS SELECTIVE OR SUPRASELECTIVE 12/01/2016 Simonne Come, MD [facility]   IR GENERIC HISTORICAL  12/01/2016   IR  US GUIDE VASC ACCESS RIGHT 12/01/2016 Simonne Come, MD [facility]   IR GENERIC HISTORICAL  12/01/2016   IR EMBO ARTERIAL NOT HEMORR HEMANG INC GUIDE ROADMAPPING 12/01/2016 Simonne Come, MD [facility]   IR GENERIC HISTORICAL  12/01/2016   IR ANGIOGRAM SELECTIVE EACH ADDITIONAL VESSEL 12/01/2016 Simonne Come, MD [facility]   IR GENERIC HISTORICAL  12/01/2016   IR ANGIOGRAM SELECTIVE EACH ADDITIONAL VESSEL 12/01/2016 Simonne Come, MD [facility]   IR GENERIC HISTORICAL  12/01/2016   IR ANGIOGRAM PELVIS SELECTIVE OR SUPRASELECTIVE 12/01/2016 Simonne Come, MD [facility]   IR GENERIC HISTORICAL  01/07/2017   IR RADIOLOGIST  EVAL & MGMT 01/07/2017 Simonne Come, MD [facility]   IR RADIOLOGIST EVAL & MGMT  08/31/2017   LOBECTOMY  2009   MYOMECTOMY  2007   UTERINE FIBROID EMBOLIZATION     12/01/2016    Current Medications: Current Meds  Medication Sig   b complex vitamins capsule Take 1 capsule by mouth daily.   Bacillus Coagulans-Inulin (ALIGN PREBIOTIC-PROBIOTIC PO) Take by mouth.   carvedilol (COREG) 12.5 MG tablet Take 12.5 mg by mouth 2 (two) times daily.   Cholecalciferol (VITAMIN D3) 125 MCG (5000 UT) CAPS Take 1 capsule by mouth daily.   cloNIDine (CATAPRES) 0.1 MG tablet 1/2 to 1 tablet twice a day   Cyanocobalamin (VITAMIN B 12 PO) Take 1 tablet by mouth daily.   Multiple Vitamin (MULTIVITAMIN WITH MINERALS) TABS tablet Take 1 tablet by mouth daily.   rosuvastatin (CRESTOR) 5 MG tablet Take 1 tablet (5 mg total) by mouth daily.   valsartan (DIOVAN) 40 MG tablet Take 40 mg by mouth 2 (two) times daily.   [DISCONTINUED] valsartan (DIOVAN) 40 MG tablet Take 1 tablet (40 mg total) by mouth 2 (two) times daily. TO BE TAKEN ALONG WITH THE 80 MG TABLETS FOR TOTAL OF 120 MG (Patient taking differently: Take 40 mg by mouth 2 (two) times daily.)   [DISCONTINUED] valsartan (DIOVAN) 80 MG tablet Take 1 tablet (80 mg total) by mouth 2 (two) times daily. TO BE TAKEN ALONG WITH THE 40 MG TABLETS FOR TOTAL OF 120 MG     Allergies:   Bystolic [nebivolol hcl], Amlodipine, Biaxin [clarithromycin], Eplerenone, Hydralazine, Peanut-containing drug products, Aspartame, and Erythromycin base   Social History   Socioeconomic History   Marital status: Divorced    Spouse name: Not on file   Number of children: 1   Years of education: Not on file   Highest education level: Master's degree (e.g., MA, MS, MEng, MEd, MSW, MBA)  Occupational History    Comment: [employer]  Tobacco Use   Smoking status: Never   Smokeless tobacco: Never  Vaping Use   Vaping status: Never Used  Substance and Sexual Activity   Alcohol use:  Not Currently    Alcohol/week: 1.0 standard drink of alcohol    Types: 1 Glasses of wine per week    Comment: quit years ago   Drug use: No   Sexual activity: Not on file  Other Topics Concern   Not on file  Social History Narrative   Lives alone   No caffiene   Right handed   Two story and steps inside   Social Determinants of Health   Financial Resource Strain: Low Risk  (05/13/2022)   Overall Financial Resource Strain (CARDIA)    Difficulty of Paying Living Expenses: Not hard at all  Food Insecurity: No Food Insecurity (05/13/2022)   Hunger Vital Sign  Worried About Programme researcher, broadcasting/film/video in the Last Year: Never true    Ran Out of Food in the Last Year: Never true  Transportation Needs: No Transportation Needs (05/13/2022)   PRAPARE - Administrator, Civil Service (Medical): No    Lack of Transportation (Non-Medical): No  Physical Activity: Inactive (05/13/2022)   Exercise Vital Sign    Days of Exercise per Week: 0 days    Minutes of Exercise per Session: 0 min  Stress: Stress Concern Present (03/05/2022)   Received from [facility], [facility] - Occupational Stress Questionnaire    Feeling of Stress : To some extent  Social Connections: Unknown (03/25/2023)   Received from [facility]   Social Network    Social Network: Not on file     Family History: The patient's family history includes Cancer in her brother; Cancer (age of onset: 63) in her sister; Diabetes in her mother, sister, and sister; Esophageal cancer in her maternal grandfather; Glaucoma in her brother, maternal uncle, and sister; Heart attack in her sister; Heart disease in her maternal uncle; Hyperlipidemia in her sister; Hypertension in her brother, father, mother, sister, and sister; Kidney Stones in her mother; Mental illness in her father, sister, and sister; Obesity in her brother, mother, and sister; Pancreatic cancer in her cousin;  Stroke in her brother, brother, and mother. There is no history of Stomach cancer or Colon cancer.  ROS:   Please see the history of present illness.    (+) Memory issues, brain fog (+) Daytime somnolence (+) Nocturia All other systems reviewed and are negative.  EKGs/Labs/Other Studies Reviewed:    CT Cardiac Scoring  03/19/2023: IMPRESSION: Coronary calcium score of 40.2. This was 88th percentile for age-, race-, and sex-matched controls.   Aorta: Borderline dilated at 38 mm (non-contrast), level of the main PA bifurcation.  MRI Abdomen  11/18/2022  [facility]): IMPRESSION:  1. Enlarged bilateral kidneys with innumerable cysts essentially replacing the renal parenchyma suggesting polycystic kidney disease. Suggestion of a horseshoe kidney.  2.  Multiple subcentimeter hepatic cysts.  3.  Gallbladder sludge.  4.  Uterine fibroids.  5.  Mild pelvic fluid.    Echo 05/26/2022:  IMPRESSIONS   1. Left ventricular ejection fraction, by estimation, is 60 to 65%. The  left ventricle has normal function. The left ventricle has no regional  wall motion abnormalities. There is mild concentric left ventricular  hypertrophy. Left ventricular diastolic  parameters are indeterminate, but likely with impaired relaxation.   2. Right ventricular systolic function is normal. The right ventricular  size is normal.   3. Left atrial size was moderately dilated.   4. The mitral valve is normal in structure. Trivial mitral valve  regurgitation.   5. The aortic valve is tricuspid. Aortic valve regurgitation is trivial.  No aortic stenosis is present.   6. The inferior vena cava is normal in size with greater than 50%  respiratory variability, suggesting right atrial pressure of 3 mmHg.    Monitor 02/2022 [facility]): 1.  Normal sinus rhythm predominates recording 2.  PACs and PAC burst are noted.  None of these are sustained 3.  Sinus tachycardia is noted during daytime activity, sinus  bradycardia is noted during evening hours 4.  No significant arrhythmias are noted otherwise.    Exercise Myoview 07/11/2021: The left ventricular ejection fraction is normal (55-65%). Nuclear stress EF: 58%. There was no ST segment deviation  noted during stress. No T wave inversion was noted during stress. The study is normal. This is a low risk study.   1.  Normal myocardial perfusion imaging study without evidence of ischemia or infarction. 2.  Normal LVEF, 58%. 3.  Excellent functional capacity (10:31 min:s; 12.5 METS).  4.  Normal heart rate and blood pressure response to exercise. 5.  This is a low risk study.   Echo 05/2021:  LVEF 60-65%  EKG:  EKG is personally reviewed. 09/29/2023:  Not ordered. 07/07/2023: Not ordered. 02/01/2023:  Sinus rhythm. Rate 61 bpm. 11/25/2022:  EKG was not ordered. 09/21/2022: EKG was not ordered. 05/13/2022: Sinus rhythm.  Rate 69 bpm  Recent Labs: No results found for requested labs within last 365 days.   Recent Lipid Panel    Component Value Date/Time   CHOL 176 08/06/2021 0853   TRIG 63 08/06/2021 0853   HDL 42 08/06/2021 0853   CHOLHDL 4.2 08/06/2021 0853   CHOLHDL 4.1 01/05/2014 0823   VLDL 12 01/05/2014 0823   LDLCALC 122 (H) 08/06/2021 0853   LDLDIRECT 148.8 11/26/2008 1223    Physical Exam:    VS:  BP (!) 150/84 (BP Location: Right Arm, Patient Position: Sitting, Cuff Size: Normal)   Pulse (!) 55   Ht 5\' 5"  (1.651 m)   Wt 182 lb 1.6 oz (82.6 kg)   SpO2 99%   BMI 30.30 kg/m  , BMI Body mass index is 30.3 kg/m. GENERAL:  Well appearing HEENT: Pupils equal round and reactive, fundi not visualized, oral mucosa unremarkable NECK:  No jugular venous distention, waveform within normal limits, carotid upstroke brisk and symmetric, no bruits, no thyromegaly LUNGS:  Clear to auscultation bilaterally HEART:  RRR.  PMI not displaced or sustained, S1 and S2 within normal limits, no S3, no S4, no clicks, no rubs, no murmurs ABD:   Flat, positive bowel sounds normal in frequency in pitch, no bruits, no rebound, no guarding, no midline pulsatile mass, no hepatomegaly, no splenomegaly EXT:  2 plus pulses throughout, no edema, no cyanosis, no clubbing SKIN:  No rashes, no nodules NEURO:  Cranial nerves II through XII grossly intact, motor grossly intact throughout PSYCH:  Cognitively intact, oriented to person place and time   ASSESSMENT/PLAN:    # Hypertension BP is finally clontrolled with current regimen of Valsartan and Carvedilol. Patient reports memory issues and frequent urination, which she attributes to her medications. However, these are not typical side effects of her current regimen.  She doesn't want to reduce or stop carvedilol because her palpitations are finally controlled.  -Reduce Valsartan to 40mg  twice daily. -Add Clonidine 0.05mg  twice daily.  OK to do 0.1mg  if she cannot split the pill.  -Check blood pressure at home and report values.  # Memory Issues Patient reports worsening memory and problem-solving difficulties over the past 4-5 months. This is not typically associated with her current medications. She has not yet started the prescribed Rosuvastatin, which can sometimes cause cognitive issues. -Consider referral to neurology for further evaluation and potential brain imaging.   # Aortic atherosclerosis:  # Hyperlipidemia:  Patient reports that she never started her rosuvastatin.  Again recommend that she try it.   General Health Maintenance Patient has been exercising three times a week and has lost weight, which is beneficial for her overall health and blood pressure control. -Encourage continuation of regular exercise and healthy lifestyle habits.  Follow-up in a couple of months to assess response to medication changes and any  progress on memory issues.      Screening for Secondary Hypertension:     05/13/2022    9:50 AM  Causes  Drugs/Herbals Screened     - Comments plant based.   minimizes sodium.  no caffeine, etOH  Sleep Apnea Screened     - Comments check sleep study  Thyroid Disease Screened    Relevant Labs/Studies:    Latest Ref Rng & Units 09/28/2022    1:23 PM 08/06/2021    8:53 AM 07/09/2021   11:19 AM  Basic Labs  Sodium 134 - 144 mmol/L 144  142  148   Potassium 3.5 - 5.2 mmol/L 4.7  4.8  4.6   Creatinine 0.57 - 1.00 mg/dL 4.78  2.95  6.21        Latest Ref Rng & Units 06/04/2021    2:57 AM 11/20/2016    1:10 PM  Thyroid   TSH 0.350 - 4.500 uIU/mL 2.543  2.070       Disposition:    FU with [facility] Hypertension Clinic in 2 months.  Medication Adjustments/Labs and Tests Ordered: Current medicines are reviewed at length with the patient today.  Concerns regarding medicines are outlined above.   No orders of the defined types were placed in this encounter.  Meds ordered this encounter  Medications   cloNIDine (CATAPRES) 0.1 MG tablet    Sig: 1/2 to 1 tablet twice a day    Dispense:  60 tablet    Refill:  5   I,Mathew Stumpf,acting as a scribe for Heidi Si, MD.,have documented all relevant documentation on the behalf of Heidi Si, MD,as directed by  Heidi Si, MD while in the presence of Heidi Si, MD.  I, Minda Faas C. Duke Salvia, MD have reviewed all documentation for this visit.  The documentation of the exam, diagnosis, procedures, and orders on 09/29/2023 are all accurate and complete.  Signed, Heidi Si, MD  09/29/2023 4:24 PM    [facility]

## 2023-10-08 NOTE — Progress Notes (Signed)
[facility] PREP Evaluation  Patient Details  Name: Heidi Herrera MRN: [id number] Date of Birth: 12-Dec-1966 Age: 56 y.o. PCP: Andi Devon, MD  Vitals:   10/07/23 1800  BP: (!) 142/88  Pulse: 71  SpO2: 97%  Weight: 178 lb 12.8 oz (81.1 kg)     [facility] Eval - 10/08/23 1200       [facility] "PREP" Location   [facility] "PREP" Location [facility]      Referral    Referring Provider Duke Salvia    Reason for referral Hypertension    Program Start Date 07/06/23    Program End Date 10/07/23      Measurement   Waist Circumference 39 inches    Waist Circumference End Program 37 inches    Hip Circumference 41 inches    Hip Circumference End Program 41 inches    Body fat 39.1 percent      Information for Trainer   Goals Goals reset      Mobility and Daily Activities   I find it easy to walk up or down two or more flights of stairs. 4    I have no trouble taking out the trash. 4    I do housework such as vacuuming and dusting on my own without difficulty. 4    I can easily lift a gallon of milk (8lbs). 4    I can easily walk a mile. 4    I have no trouble reaching into high cupboards or reaching down to pick up something from the floor. 4    I do not have trouble doing out-door work such as Loss adjuster, chartered, raking leaves, or gardening. 4      Mobility and Daily Activities   I feel younger than my age. 4    I feel independent. 4    I feel energetic. 4    I live an active life.  4    I feel strong. 4    I feel healthy. 4    I feel active as other people my age. 3      How fit and strong are you.   Fit and Strong Total Score 55            Past Medical History:  Diagnosis Date   Acid reflux    Allergy    Anemia    Anxiety    Fibroids    GERD (gastroesophageal reflux disease)    H. pylori infection    Hypertension    Kidney failure    stage 2   Migraine    Obesity    Palpitations 07/07/2023   Polycystic kidney disease    Pure hypercholesterolemia 07/07/2023    Sebaceous cyst    Sessile colonic polyp    Vertigo    Past Surgical History:  Procedure Laterality Date   IR GENERIC HISTORICAL  12/01/2016   IR ANGIOGRAM PELVIS SELECTIVE OR SUPRASELECTIVE 12/01/2016 Simonne Come, MD [facility]   IR GENERIC HISTORICAL  12/01/2016   IR US GUIDE VASC ACCESS RIGHT 12/01/2016 Simonne Come, MD [facility]   IR GENERIC HISTORICAL  12/01/2016   IR EMBO ARTERIAL NOT HEMORR HEMANG INC GUIDE ROADMAPPING 12/01/2016 Simonne Come, MD [facility]   IR GENERIC HISTORICAL  12/01/2016   IR ANGIOGRAM SELECTIVE EACH ADDITIONAL VESSEL 12/01/2016 Simonne Come, MD [facility]   IR GENERIC HISTORICAL  12/01/2016   IR ANGIOGRAM SELECTIVE EACH ADDITIONAL VESSEL 12/01/2016 Simonne Come, MD [facility]   IR GENERIC HISTORICAL  12/01/2016  IR ANGIOGRAM PELVIS SELECTIVE OR SUPRASELECTIVE 12/01/2016 Simonne Come, MD [facility]   IR GENERIC HISTORICAL  01/07/2017   IR RADIOLOGIST EVAL & MGMT 01/07/2017 Simonne Come, MD [facility]   IR RADIOLOGIST EVAL & MGMT  08/31/2017   LOBECTOMY  2009   MYOMECTOMY  2007   UTERINE FIBROID EMBOLIZATION     12/01/2016   Social History   Tobacco Use  Smoking Status Never  Smokeless Tobacco Never  Attended > 20 workouts, 12 educational session Fit testing: Cardio march: 251 to 288 Sit to stand: 7 to 16 Bicep curl: 12 to 22 Balance remains good.  Encouraged to continue with healthy lifestyle and exercise habits.   [name] 10/08/2023, 12:05 PM

## 2023-10-25 ENCOUNTER — Other Ambulatory Visit (HOSPITAL_BASED_OUTPATIENT_CLINIC_OR_DEPARTMENT_OTHER): Payer: Self-pay | Admitting: Cardiovascular Disease

## 2023-12-04 ENCOUNTER — Other Ambulatory Visit: Payer: Self-pay | Admitting: Cardiovascular Disease

## 2024-01-05 ENCOUNTER — Encounter (HOSPITAL_BASED_OUTPATIENT_CLINIC_OR_DEPARTMENT_OTHER): Payer: BC Managed Care – PPO | Admitting: Cardiovascular Disease

## 2024-02-18 ENCOUNTER — Encounter (HOSPITAL_BASED_OUTPATIENT_CLINIC_OR_DEPARTMENT_OTHER): Payer: Self-pay

## 2024-02-21 ENCOUNTER — Encounter (HOSPITAL_BASED_OUTPATIENT_CLINIC_OR_DEPARTMENT_OTHER): Payer: Self-pay | Admitting: Cardiovascular Disease

## 2024-02-21 ENCOUNTER — Ambulatory Visit (HOSPITAL_BASED_OUTPATIENT_CLINIC_OR_DEPARTMENT_OTHER): Payer: BC Managed Care – PPO | Admitting: Cardiovascular Disease

## 2024-02-21 VITALS — BP 164/80 | HR 66 | Ht 65.0 in | Wt 182.4 lb

## 2024-02-21 DIAGNOSIS — I2510 Atherosclerotic heart disease of native coronary artery without angina pectoris: Secondary | ICD-10-CM | POA: Diagnosis not present

## 2024-02-21 DIAGNOSIS — N1831 Chronic kidney disease, stage 3a: Secondary | ICD-10-CM

## 2024-02-21 DIAGNOSIS — I7090 Unspecified atherosclerosis: Secondary | ICD-10-CM

## 2024-02-21 DIAGNOSIS — E7800 Pure hypercholesterolemia, unspecified: Secondary | ICD-10-CM

## 2024-02-21 DIAGNOSIS — I10 Essential (primary) hypertension: Secondary | ICD-10-CM

## 2024-02-21 DIAGNOSIS — Z5181 Encounter for therapeutic drug level monitoring: Secondary | ICD-10-CM

## 2024-02-21 MED ORDER — CLONIDINE HCL 0.1 MG PO TABS: 0.1000 mg | ORAL_TABLET | Freq: Two times a day (BID) | ORAL | 5 refills | Status: DC

## 2024-02-21 NOTE — Patient Instructions (Addendum)
 Medication Instructions:  INCREASE YOUR CLONIDINE TO 0.1 MG TWICE A DAY  OK TO TAKE AN EXTRA AS NEEDED FOR BLOOD PRESSURE ABOVE 160  Labwork: FASTIN LP/CMET SOON   Testing/Procedures: NONE  Follow-Up: 3 MONTHS WITH CAITLIN W NP OR [facility]

## 2024-02-21 NOTE — Progress Notes (Signed)
 [facility] Follow-up:    Date:  02/21/2024   ID:  Heidi Herrera, DOB 1966/08/21, MRN [id number]  PCP:  Patient, No Pcp Per  Cardiologist:  Heidi Si, MD  Nephrologist:  Referring MD: Heidi Devon, MD   CC: Hypertension  History of Present Illness:    Heidi Herrera is a 58 y.o. female with hypertension, aortic atherosclerosis, non-obstructive CAD, CKD 3, polycystic kidney disease, horseshoe kidney, and hyperlipidemia, here for follow-up. She was initially seen 05/13/2022 to establish care.  She last saw Heidi Herrera 07/2021 and complained of frequent urination.  Lisinopril was increased while hydrochlorothiazide was reduced.  At that visit her blood pressure was 138/84.  She previously had a nuclear stress test 06/2021 that revealed LVEF 58% with no ischemia.  She saw her PCP 01/2022 and was no longer on HCTZ.  She had also self discontinued lisinopril and her blood pressure was 172/108.  It was recommended that she resume the lisinopril.  She followed up with the urologist and they started her on Vesicare for urinary frequency.     She saw a cardiologist at [facility] 01/2022 and reported palpitations.  She wore a 30-day event recorder which showed sinus tachycardia but no other significant arrhythmias.  Her BP was 168/98 and 154/84.  At the time she was only taking lisinopril.  She reported palpitations and had an event recorder that revealed sinus tachycardia in the middle of the night up to 140 bpm.  She was started on metoprolol succinate but still had some palpitations. Metoprolol was switched to carvedilol. She was enrolled in the remote patient monitoring study. She called the office stating that carvedilol was causing her to have palpitations. She also had concerns with her memory, and was advised to discuss with hear PCP. She saw Heidi Herrera, Heidi Herrera 07/2022 and noted a burning pain in her liver an hour after taking lisinopril. She was switched to valsartan. It  was noted that her blood pressure readings in the office were much higher than in [facility].    She had an MRI done for her cervical spine. She was told that she might need surgery but she has been looking into alternative options. She met with a chiropractor. They told her that her spine pressing on her nerves could be the reason of her vision, blood pressure, and digestive issues. The pain on her back and neck feels like a pressure weighing her down and she hopes that by going along with this chiropractic treatment she could resolve her other issues. At the last visit her blood pressure was higher in the office than on home monitoring. Her renin and aldosterone levels were indeterminate, and spironolactone was added. She called the office reporting side effects of spironolactone, and valsartan was increased. Blood pressures in [facility] for the last 2 weeks have ranged from the 120s-160s over 80s-100s. It has mostly been around 140/80s-90s. She had an MRI at [facility] that revealed innumerable cysts in bilateral kidneys, suggestive of polycystic kidney disease and a likely horseshoe kidney. She also had multiple hepatic cysts and uterine fibroids. She had no adrenal adenomas.   At her 11/2022 visit her blood pressure continued to be labile, averaging in the 130s-140s systolic. We recommended adding indapamide, but she wanted to increase valsartan. Blood pressures in [facility] were averaging in the 130s/90s.   In 01/2023 blood pressures were well controlled at home. She continued to work on her diet and exercise. We referred her to PREP. Given chronic neck and  back pain, also recommended water exercises. She noted increased abdominal bloating since she increased valsartan to twice daily.  She had also been diagnosed with gluten sensitivity. She planned to review her diet and focus on eliminating gluten before we make any changes to her medications. We also discussed renal denervation as a potential option. She completed  the RPM study and returned her patient monitoring device. She had a coronary CT 03/19/2023 revealing a coronary calcium score of 40.2 and borderline aortic dilation at 38 mm (non-contrast).  At her visit 06/2023, she reported home blood pressures ranging from 130s-150s systolic, averaging 140s/80s-90s. She had been unable to tolerate carvedilol and valsartan at the same time, as this seemed to cause more frequent fluttering palpitations. She found that she needs to wait at least 30-40 minutes after taking valsartan before taking the carvedilol. However, she still had occasional post-prandial palpitations, or randomly throughout the day. She complained of worsening shortness of breath with climbing her stairs at home. She was participating in the PREP program. We changed her carvedilol to nebivolol 10 mg daily, however she reported a history of angioedema on nebivolol so carvedilol was continued. Added amlodipine 2.5 mg daily and started rosuvastatin 5 mg daily. She complained of blurry vision and fatigue and wished to stop the amlodipine as well. It was recommended to try doxazosin 2 mg at bedtime but she declined as she reported taking this in the past.   At her visit 09/2023, Heidi Herrera noted short term memory issues and frequent urination which she attributed to her medication.  Valsartan was reduced and clonidine was added.  She had not started taking the recommended statin.   Prior Antihypertensives: Irbesartan Spironolactone - urinary hesitancy Amlodipine Eplerenone Hydralazine HCTZ Lisinopril Metoprolol Nebivolol - angioedema  Past Medical History:  Diagnosis Date   Acid reflux    Allergy    Anemia    Anxiety    Fibroids    GERD (gastroesophageal reflux disease)    H. pylori infection    Hypertension    Kidney failure    stage 2   Migraine    Obesity    Palpitations 07/07/2023   Polycystic kidney disease    Pure hypercholesterolemia 07/07/2023   Sebaceous cyst    Sessile colonic  polyp    Vertigo     Past Surgical History:  Procedure Laterality Date   IR GENERIC HISTORICAL  12/01/2016   IR ANGIOGRAM PELVIS SELECTIVE OR SUPRASELECTIVE 12/01/2016 Simonne Come, MD [facility]   IR GENERIC HISTORICAL  12/01/2016   IR US GUIDE VASC ACCESS RIGHT 12/01/2016 Simonne Come, MD [facility]   IR GENERIC HISTORICAL  12/01/2016   IR EMBO ARTERIAL NOT HEMORR HEMANG INC GUIDE ROADMAPPING 12/01/2016 Simonne Come, MD [facility]   IR GENERIC HISTORICAL  12/01/2016   IR ANGIOGRAM SELECTIVE EACH ADDITIONAL VESSEL 12/01/2016 Simonne Come, MD [facility]   IR GENERIC HISTORICAL  12/01/2016   IR ANGIOGRAM SELECTIVE EACH ADDITIONAL VESSEL 12/01/2016 Simonne Come, MD [facility]   IR GENERIC HISTORICAL  12/01/2016   IR ANGIOGRAM PELVIS SELECTIVE OR SUPRASELECTIVE 12/01/2016 Simonne Come, MD [facility]   IR GENERIC HISTORICAL  01/07/2017   IR RADIOLOGIST EVAL & MGMT 01/07/2017 Simonne Come, MD [facility]   IR RADIOLOGIST EVAL & MGMT  08/31/2017   LOBECTOMY  2009   MYOMECTOMY  2007   UTERINE FIBROID EMBOLIZATION     12/01/2016    Current Medications: Current Meds  Medication Sig   b complex vitamins capsule  Take 1 capsule by mouth daily.   Bacillus Coagulans-Inulin (ALIGN PREBIOTIC-PROBIOTIC PO) Take by mouth.   carvedilol (COREG) 12.5 MG tablet Take 1 tablet by mouth twice daily   Cholecalciferol (VITAMIN D3) 125 MCG (5000 UT) CAPS Take 1 capsule by mouth daily.   Cyanocobalamin (VITAMIN B 12 PO) Take 1 tablet by mouth daily.   meclizine (ANTIVERT) 25 MG tablet Take 25 mg by mouth as needed for dizziness.   Multiple Vitamin (MULTIVITAMIN WITH MINERALS) TABS tablet Take 1 tablet by mouth daily.   valsartan (DIOVAN) 40 MG tablet Take 1 tablet (40 mg total) by mouth 2 (two) times daily.   [DISCONTINUED] cloNIDine (CATAPRES) 0.1 MG tablet 1/2 to 1 tablet twice a day     Allergies:   Bystolic [nebivolol hcl], Amlodipine, Biaxin [clarithromycin], Eplerenone, Hydralazine,  Peanut-containing drug products, Aspartame, and Erythromycin base   Social History   Socioeconomic History   Marital status: Divorced    Spouse name: Not on file   Number of children: 1   Years of education: Not on file   Highest education level: Master's degree (e.g., MA, MS, MEng, MEd, MSW, MBA)  Occupational History    Comment: [employer]  Tobacco Use   Smoking status: Never   Smokeless tobacco: Never  Vaping Use   Vaping status: Never Used  Substance and Sexual Activity   Alcohol use: Not Currently    Alcohol/week: 1.0 standard drink of alcohol    Types: 1 Glasses of wine per week    Comment: quit years ago   Drug use: No   Sexual activity: Not on file  Other Topics Concern   Not on file  Social History Narrative   Lives alone   No caffiene   Right handed   Two story and steps inside   Social Drivers of Health   Financial Resource Strain: Low Risk  (05/13/2022)   Overall Financial Resource Strain (CARDIA)    Difficulty of Paying Living Expenses: Not hard at all  Food Insecurity: No Food Insecurity (05/13/2022)   Hunger Vital Sign    Worried About Running Out of Food in the Last Year: Never true    Ran Out of Food in the Last Year: Never true  Transportation Needs: No Transportation Needs (05/13/2022)   PRAPARE - Administrator, Civil Service (Medical): No    Lack of Transportation (Non-Medical): No  Physical Activity: Inactive (05/13/2022)   Exercise Vital Sign    Days of Exercise per Week: 0 days    Minutes of Exercise per Session: 0 min  Stress: Stress Concern Present (03/05/2022)   Received [facility], [facility] - Occupational Stress Questionnaire    Feeling of Stress : To some extent  Social Connections: Unknown (03/25/2023)   [facility], [facility]   Social Network    Social Network: Not on file     Family History: The patient's family history includes Cancer in her  brother; Cancer (age of onset: 33) in her sister; Diabetes in her mother, sister, and sister; Esophageal cancer in her maternal grandfather; Glaucoma in her brother, maternal uncle, and sister; Heart attack in her sister; Heart disease in her maternal uncle; Hyperlipidemia in her sister; Hypertension in her brother, father, mother, sister, and sister; Kidney Stones in her mother; Mental illness in her father, sister, and sister; Obesity in her brother, mother, and sister; Pancreatic cancer in her cousin; Stroke in her brother, brother, and  mother. There is no history of Stomach cancer or Colon cancer.  ROS:   Please see the history of present illness.    (+) Memory issues, brain fog (+) Daytime somnolence (+) Nocturia All other systems reviewed and are negative.  EKGs/Labs/Other Studies Reviewed:      EKG Interpretation Date/Time:  Monday February 21 2024 13:27:44 EDT Ventricular Rate:  54 PR Interval:  168 QRS Duration:  82 QT Interval:  420 QTC Calculation: 398 R Axis:   22  Text Interpretation: Sinus bradycardia Since last tracing Rate slower Confirmed by Heidi Herrera ([id number]) on 02/21/2024 1:38:38 PM        CT Cardiac Scoring  03/19/2023: IMPRESSION: Coronary calcium score of 40.2. This was 88th percentile for age-, race-, and sex-matched controls.   Aorta: Borderline dilated at 38 mm (non-contrast), level of the main PA bifurcation.  MRI Abdomen  11/18/2022  [facility]): IMPRESSION:  1. Enlarged bilateral kidneys with innumerable cysts essentially replacing the renal parenchyma suggesting polycystic kidney disease. Suggestion of a horseshoe kidney.  2.  Multiple subcentimeter hepatic cysts.  3.  Gallbladder sludge.  4.  Uterine fibroids.  5.  Mild pelvic fluid.    Echo 05/26/2022:  IMPRESSIONS   1. Left ventricular ejection fraction, by estimation, is 60 to 65%. The  left ventricle has normal function. The left ventricle has no regional  wall motion abnormalities.  There is mild concentric left ventricular  hypertrophy. Left ventricular diastolic  parameters are indeterminate, but likely with impaired relaxation.   2. Right ventricular systolic function is normal. The right ventricular  size is normal.   3. Left atrial size was moderately dilated.   4. The mitral valve is normal in structure. Trivial mitral valve  regurgitation.   5. The aortic valve is tricuspid. Aortic valve regurgitation is trivial.  No aortic stenosis is present.   6. The inferior vena cava is normal in size with greater than 50%  respiratory variability, suggesting right atrial pressure of 3 mmHg.    Monitor 02/2022 [facility]): 1.  Normal sinus rhythm predominates recording 2.  PACs and PAC burst are noted.  None of these are sustained 3.  Sinus tachycardia is noted during daytime activity, sinus bradycardia is noted during evening hours 4.  No significant arrhythmias are noted otherwise.    Exercise Myoview 07/11/2021: The left ventricular ejection fraction is normal (55-65%). Nuclear stress EF: 58%. There was no ST segment deviation noted during stress. No T wave inversion was noted during stress. The study is normal. This is a low risk study.   1.  Normal myocardial perfusion imaging study without evidence of ischemia or infarction. 2.  Normal LVEF, 58%. 3.  Excellent functional capacity (10:31 min:s; 12.5 METS).  4.  Normal heart rate and blood pressure response to exercise. 5.  This is a low risk study.   Echo 05/2021:  LVEF 60-65%  EKG:  EKG is personally reviewed. 09/29/2023:  Not ordered. 07/07/2023: Not ordered. 02/01/2023:  Sinus rhythm. Rate 61 bpm. 11/25/2022:  EKG was not ordered. 09/21/2022: EKG was not ordered. 05/13/2022: Sinus rhythm.  Rate 69 bpm  Recent Labs: No results found for requested labs within last 365 days.   Recent Lipid Panel    Component Value Date/Time   CHOL 176 08/06/2021 0853   TRIG 63 08/06/2021 0853   HDL 42 08/06/2021  0853   CHOLHDL 4.2 08/06/2021 0853   CHOLHDL 4.1 01/05/2014 0823   VLDL 12 01/05/2014 0823   LDLCALC 122 (  H) 08/06/2021 0853   LDLDIRECT 148.8 11/26/2008 1223    Physical Exam:    VS:  BP (!) 164/80   Pulse 66   Ht 5\' 5"  (1.651 m)   Wt 182 lb 6.4 oz (82.7 kg)   SpO2 100%   BMI 30.35 kg/m  , BMI Body mass index is 30.35 kg/m. GENERAL:  Well appearing HEENT: Pupils equal round and reactive, fundi not visualized, oral mucosa unremarkable NECK:  No jugular venous distention, waveform within normal limits, carotid upstroke brisk and symmetric, no bruits, no thyromegaly LUNGS:  Clear to auscultation bilaterally HEART:  RRR.  PMI not displaced or sustained, S1 and S2 within normal limits, no S3, no S4, no clicks, no rubs, no murmurs ABD:  Flat, positive bowel sounds normal in frequency in pitch, no bruits, no rebound, no guarding, no midline pulsatile mass, no hepatomegaly, no splenomegaly EXT:  2 plus pulses throughout, no edema, no cyanosis, no clubbing SKIN:  No rashes, no nodules NEURO:  Cranial nerves II through XII grossly intact, motor grossly intact throughout PSYCH:  Cognitively intact, oriented to person place and time   ASSESSMENT/PLAN:    # Hypertension Hypertension poorly controlled with episodes of significant elevation. Current clonidine dosage may be insufficient. Anxiety may contribute to spikes. Emphasized exercise and diet for management. - Increase clonidine to a full tablet. - Consider extra clonidine for significant elevation. - Encourage regular exercise and healthy diet. - Continue valsartan  # Vertigo Recent episode managed effectively with meclizine. - Use meclizine as needed.  # Anxiety Anxiety may elevate blood pressure. Discussed non-pharmacological management strategies. - Practice box breathing exercises. - Consider lifestyle changes to reduce triggers. - Limit stress-inducing media exposure.   # Aortic atherosclerosis: #  Hypercholesterolemia: Cholesterol elevated despite vegan diet. Previously paused rosuvastatin due to memory concerns. Emphasized diet and exercise. - Order fasting lipid panel and comprehensive metabolic panel. - Encourage vegan diet with healthy choices. - Discuss potential rosuvastatin resumption based on results.      Screening for Secondary Hypertension:     05/13/2022    9:50 AM  Causes  Drugs/Herbals Screened     - Comments plant based.  minimizes sodium.  no caffeine, etOH  Sleep Apnea Screened     - Comments check sleep study  Thyroid Disease Screened    Relevant Labs/Studies:    Latest Ref Rng & Units 09/28/2022    1:23 PM 08/06/2021    8:53 AM 07/09/2021   11:19 AM  Basic Labs  Sodium 134 - 144 mmol/L 144  142  148   Potassium 3.5 - 5.2 mmol/L 4.7  4.8  4.6   Creatinine 0.57 - 1.00 mg/dL 6.57  8.46  9.62        Latest Ref Rng & Units 06/04/2021    2:57 AM 11/20/2016    1:10 PM  Thyroid   TSH 0.350 - 4.500 uIU/mL 2.543  2.070       Disposition:    FU with Advanced Hypertension Clinic in 2 months.  Medication Adjustments/Labs and Tests Ordered: Current medicines are reviewed at length with the patient today.  Concerns regarding medicines are outlined above.   Orders Placed This Encounter  Procedures   Lipid panel   Comprehensive metabolic panel   EKG 12-Lead   Meds ordered this encounter  Medications   cloNIDine (CATAPRES) 0.1 MG tablet    Sig: Take 1 tablet (0.1 mg total) by mouth 2 (two) times daily. OK TO TAKE AN EXTRA TABLET AS  NEEDED FOR BLOOD PRESSURE  ABOVE 160    Dispense:  75 tablet    Refill:  5    NEW SIG, DECREASE PREVIOUS RX    Signed, Heidi Si, MD  02/21/2024 6:34 PM    [facility]

## 2024-02-24 ENCOUNTER — Encounter: Payer: Self-pay | Admitting: Family Medicine

## 2024-02-24 ENCOUNTER — Ambulatory Visit: Payer: Self-pay | Admitting: Family Medicine

## 2024-02-24 VITALS — BP 135/87 | HR 97 | Temp 97.9°F | Resp 16 | Ht 65.0 in | Wt 181.8 lb

## 2024-02-24 DIAGNOSIS — Z87718 Personal history of other specified (corrected) congenital malformations of genitourinary system: Secondary | ICD-10-CM

## 2024-02-24 DIAGNOSIS — I10 Essential (primary) hypertension: Secondary | ICD-10-CM | POA: Diagnosis not present

## 2024-02-24 DIAGNOSIS — Z7689 Persons encountering health services in other specified circumstances: Secondary | ICD-10-CM

## 2024-02-24 DIAGNOSIS — E785 Hyperlipidemia, unspecified: Secondary | ICD-10-CM | POA: Diagnosis not present

## 2024-02-24 DIAGNOSIS — G542 Cervical root disorders, not elsewhere classified: Secondary | ICD-10-CM

## 2024-02-24 DIAGNOSIS — M5030 Other cervical disc degeneration, unspecified cervical region: Secondary | ICD-10-CM

## 2024-02-24 DIAGNOSIS — K219 Gastro-esophageal reflux disease without esophagitis: Secondary | ICD-10-CM

## 2024-02-24 NOTE — Progress Notes (Signed)
 New Patient Office Visit  Subjective    Patient ID: Heidi Herrera, female    DOB: [birth year]  Age: 58 y.o. MRN: [id number]  CC:  Chief Complaint  Patient presents with   Establish Care    Pain management    HPI Heidi Herrera presents to establish care and for review of chronic med issues including hypertension, GERD, and hyperlipidemia.   Outpatient Encounter Medications as of 02/24/2024  Medication Sig   b complex vitamins capsule Take 1 capsule by mouth daily.   Bacillus Coagulans-Inulin (ALIGN PREBIOTIC-PROBIOTIC PO) Take by mouth.   carvedilol (COREG) 12.5 MG tablet Take 1 tablet by mouth twice daily   Cholecalciferol (VITAMIN D3) 125 MCG (5000 UT) CAPS Take 1 capsule by mouth daily.   cloNIDine (CATAPRES) 0.1 MG tablet Take 1 tablet (0.1 mg total) by mouth 2 (two) times daily. OK TO TAKE AN EXTRA TABLET AS NEEDED FOR BLOOD PRESSURE  ABOVE 160   Cyanocobalamin (VITAMIN B 12 PO) Take 1 tablet by mouth daily.   meclizine (ANTIVERT) 25 MG tablet Take 25 mg by mouth as needed for dizziness.   Multiple Vitamin (MULTIVITAMIN WITH MINERALS) TABS tablet Take 1 tablet by mouth daily.   valsartan (DIOVAN) 40 MG tablet Take 1 tablet (40 mg total) by mouth 2 (two) times daily.   famotidine (PEPCID) 20 MG tablet Take 20 mg by mouth 2 (two) times daily. (Patient not taking: Reported on 02/21/2024)   rosuvastatin (CRESTOR) 5 MG tablet Take 1 tablet (5 mg total) by mouth daily.   No facility-administered encounter medications on file as of 02/24/2024.    Past Medical History:  Diagnosis Date   Acid reflux    Allergy    Anemia    Anxiety    Fibroids    GERD (gastroesophageal reflux disease)    H. pylori infection    Hypertension    Kidney failure    stage 2   Migraine    Obesity    Palpitations 07/07/2023   Polycystic kidney disease    Pure hypercholesterolemia 07/07/2023   Sebaceous cyst    Sessile colonic polyp    Vertigo     Past Surgical History:  Procedure  Laterality Date   IR GENERIC HISTORICAL  12/01/2016   IR ANGIOGRAM PELVIS SELECTIVE OR SUPRASELECTIVE 12/01/2016 Simonne Come, MD [facility]   IR GENERIC HISTORICAL  12/01/2016   IR US GUIDE VASC ACCESS RIGHT 12/01/2016 Simonne Come, MD [facility]   IR GENERIC HISTORICAL  12/01/2016   IR EMBO ARTERIAL NOT HEMORR HEMANG INC GUIDE ROADMAPPING 12/01/2016 Simonne Come, MD [facility]   IR GENERIC HISTORICAL  12/01/2016   IR ANGIOGRAM SELECTIVE EACH ADDITIONAL VESSEL 12/01/2016 Simonne Come, MD [facility]   IR GENERIC HISTORICAL  12/01/2016   IR ANGIOGRAM SELECTIVE EACH ADDITIONAL VESSEL 12/01/2016 Simonne Come, MD [facility]   IR GENERIC HISTORICAL  12/01/2016   IR ANGIOGRAM PELVIS SELECTIVE OR SUPRASELECTIVE 12/01/2016 Simonne Come, MD [facility]   IR GENERIC HISTORICAL  01/07/2017   IR RADIOLOGIST EVAL & MGMT 01/07/2017 Simonne Come, MD [facility]   IR RADIOLOGIST EVAL & MGMT  08/31/2017   LOBECTOMY  2009   MYOMECTOMY  2007   UTERINE FIBROID EMBOLIZATION     12/01/2016    Family History  Problem Relation Age of Onset   Hypertension Mother    Obesity Mother    Stroke Mother    Diabetes Mother    Kidney Stones Mother    Mental  illness Father    Hypertension Father    Heart attack Sister    Hypertension Sister    Hyperlipidemia Sister    Obesity Sister    Diabetes Sister    Mental illness Sister    Glaucoma Sister    Hypertension Sister    Mental illness Sister    Cancer Sister 39   Diabetes Sister    Hypertension Brother    Obesity Brother    Stroke Brother    Glaucoma Brother    Cancer Brother    Stroke Brother    Heart disease Maternal Uncle    Glaucoma Maternal Uncle    Esophageal cancer Maternal Grandfather    Pancreatic cancer Cousin    Stomach cancer Neg Hx    Colon cancer Neg Hx     Social History   Socioeconomic History   Marital status: Divorced    Spouse name: Not on file   Number of children: 1   Years of education: Not on file   Highest  education level: Master's degree (e.g., MA, MS, MEng, MEd, MSW, MBA)  Occupational History    Comment: [employer]  Tobacco Use   Smoking status: Never   Smokeless tobacco: Never  Vaping Use   Vaping status: Never Used  Substance and Sexual Activity   Alcohol use: Not Currently    Alcohol/week: 1.0 standard drink of alcohol    Types: 1 Glasses of wine per week    Comment: quit years ago   Drug use: No   Sexual activity: Not on file  Other Topics Concern   Not on file  Social History Narrative   Lives alone   No caffiene   Right handed   Two story and steps inside   Social Drivers of Health   Financial Resource Strain: Low Risk  (02/24/2024)   Overall Financial Resource Strain (CARDIA)    Difficulty of Paying Living Expenses: Not hard at all  Food Insecurity: No Food Insecurity (02/24/2024)   Hunger Vital Sign    Worried About Running Out of Food in the Last Year: Never true    Ran Out of Food in the Last Year: Never true  Transportation Needs: No Transportation Needs (02/24/2024)   PRAPARE - Administrator, Civil Service (Medical): No    Lack of Transportation (Non-Medical): No  Physical Activity: Insufficiently Active (02/24/2024)   Exercise Vital Sign    Days of Exercise per Week: 3 days    Minutes of Exercise per Session: 30 min  Stress: No Stress Concern Present (02/24/2024)   [employer] of Occupational Health - Occupational Stress Questionnaire    Feeling of Stress : Not at all  Social Connections: Moderately Isolated (02/24/2024)   Social Connection and Isolation Panel [NHANES]    Frequency of Communication with Friends and Family: Three times a week    Frequency of Social Gatherings with Friends and Family: Once a week    Attends Religious Services: More than 4 times per year    Active Member of [employer] or Organizations: No    Attends Banker Meetings: Never    Marital Status: Divorced  Catering manager Violence: Not At Risk (02/24/2024)    Humiliation, Afraid, Rape, and Kick questionnaire    Fear of Current or Ex-Partner: No    Emotionally Abused: No    Physically Abused: No    Sexually Abused: No    Review of Systems  All other systems reviewed and are negative.  Objective   BP 135/87   Pulse 97   Temp 97.9 F (36.6 C) (Oral)   Resp 16   Ht 5\' 5"  (1.651 m)   Wt 181 lb 12.8 oz (82.5 kg)   BMI 30.25 kg/m   Physical Exam Vitals and nursing note reviewed.  Constitutional:      General: She is not in acute distress. Cardiovascular:     Rate and Rhythm: Normal rate and regular rhythm.  Pulmonary:     Effort: Pulmonary effort is normal.     Breath sounds: Normal breath sounds.  Abdominal:     Palpations: Abdomen is soft.     Tenderness: There is no abdominal tenderness.  Musculoskeletal:     Cervical back: Normal range of motion and neck supple. Tenderness present.  Neurological:     General: No focal deficit present.     Mental Status: She is alert and oriented to person, place, and time.         Assessment & Plan:   1. Essential hypertension (Primary) Appears stable. Meds refilled  2. Hyperlipidemia, unspecified hyperlipidemia type Continue   3. Gastroesophageal reflux disease, unspecified whether esophagitis present Continue. Dietary and activity options discussed  4. History of polycystic kidney disease Management as per consultant   5. DDD (degenerative disc disease), cervical Ortho referral  6. Encounter to establish care    No follow-ups on file.   Tommie Raymond, MD

## 2024-02-25 ENCOUNTER — Telehealth: Payer: Self-pay | Admitting: Family Medicine

## 2024-02-25 NOTE — Telephone Encounter (Signed)
 Copied from CRM [id number]. Topic: Referral - Question >> Feb 25, 2024  2:22 PM Fredrica W wrote: Reason for CRM: patient called states she called [facility] for referral but found they are not a clear pricing provider with Monia Pouch and she would like to know if it can be changes to [facility] Phone: [phone].

## 2024-02-28 ENCOUNTER — Telehealth: Payer: Self-pay | Admitting: Cardiovascular Disease

## 2024-02-28 NOTE — Telephone Encounter (Signed)
 Returned call to patient,   Patient states that dr. Duke Salvia increased her clonidine at last office visit. She states when she takes the whole tablet she feels really dizzy and her mouth very dry. BP since increase 141/87, 130s/90s. HR has been normal. She states she also notes increased fatigue, like she cannot get enough sleep. She increased the day of her last visit.   Advised would route to Dr. Duke Salvia for review and then call with recommendations.

## 2024-02-28 NOTE — Telephone Encounter (Signed)
 Pt c/o medication issue:  1. Name of Medication: cloNIDine (CATAPRES) 0.1 MG tablet  carvedilol (COREG) 12.5 MG tablet  2. How are you currently taking this medication (dosage and times per day)? Taking both as directed  3. Are you having a reaction (difficulty breathing--STAT)? No  4. What is your medication issue? Feeling really fatigue which started with increase in med, requesting cb to discuss

## 2024-02-29 ENCOUNTER — Other Ambulatory Visit: Payer: Self-pay | Admitting: Cardiovascular Disease

## 2024-02-29 NOTE — Telephone Encounter (Signed)
Noted - let patient know.

## 2024-03-01 LAB — LIPID PANEL
Chol/HDL Ratio: 3.5 ratio (ref 0.0–4.4)
Cholesterol, Total: 149 mg/dL (ref 100–199)
HDL: 42 mg/dL (ref >39)
LDL Chol Calc (NIH): 93 mg/dL (ref 0–99)
Triglycerides: 69 mg/dL (ref 0–149)
VLDL Cholesterol Cal: 14 mg/dL (ref 5–40)

## 2024-03-01 LAB — COMPREHENSIVE METABOLIC PANEL
ALT: 14 IU/L (ref 0–32)
AST: 18 IU/L (ref 0–40)
Albumin: 4.2 g/dL (ref 3.8–4.9)
Alkaline Phosphatase: 69 IU/L (ref 44–121)
BUN/Creatinine Ratio: 11 (ref 9–23)
BUN: 12 mg/dL (ref 6–24)
Bilirubin Total: 0.4 mg/dL (ref 0.0–1.2)
CO2: 23 mmol/L (ref 20–29)
Calcium: 9.2 mg/dL (ref 8.7–10.2)
Chloride: 107 mmol/L — ABNORMAL HIGH (ref 96–106)
Creatinine, Ser: 1.12 mg/dL — ABNORMAL HIGH (ref 0.57–1.00)
Globulin, Total: 3.1 g/dL (ref 1.5–4.5)
Glucose: 75 mg/dL (ref 70–99)
Potassium: 4.4 mmol/L (ref 3.5–5.2)
Sodium: 144 mmol/L (ref 134–144)
Total Protein: 7.3 g/dL (ref 6.0–8.5)
eGFR: 57 mL/min/{1.73_m2} — ABNORMAL LOW (ref >59)

## 2024-03-16 ENCOUNTER — Ambulatory Visit: Admitting: Orthopedic Surgery

## 2024-03-26 ENCOUNTER — Encounter (HOSPITAL_BASED_OUTPATIENT_CLINIC_OR_DEPARTMENT_OTHER): Payer: Self-pay | Admitting: Cardiovascular Disease

## 2024-03-27 ENCOUNTER — Ambulatory Visit

## 2024-03-27 ENCOUNTER — Ambulatory Visit (INDEPENDENT_AMBULATORY_CARE_PROVIDER_SITE_OTHER): Admitting: Sports Medicine

## 2024-03-27 DIAGNOSIS — M542 Cervicalgia: Secondary | ICD-10-CM

## 2024-03-27 DIAGNOSIS — M5030 Other cervical disc degeneration, unspecified cervical region: Secondary | ICD-10-CM

## 2024-03-27 MED ORDER — PREDNISONE 50 MG PO TABS: ORAL_TABLET | ORAL | 0 refills | Status: DC

## 2024-03-27 NOTE — Assessment & Plan Note (Signed)
 This is a very pleasant 58 year old female, she has a long history of axial neck pain, left-sided, nothing overtly radicular, she was dropping objects but this has resolved, she has had therapy and chiropractic manipulation in the past both of which have helped. Now having recurrence of pain left-sided axial, better with postural training. Exam is normal. We discussed the anatomy and pathophysiology, we will start with 5 days of steroids, formal PT, updated x-rays, she can do some chiropractic manipulation if she would like, return to see me in 6 weeks, MRI for interventional planning if not better.

## 2024-03-27 NOTE — Progress Notes (Signed)
    Procedures performed today:    None.  Independent interpretation of notes and tests performed by another provider:   None.  Brief History, Exam, Impression, and Recommendations:    DDD (degenerative disc disease), cervical This is a very pleasant 58 year old female, she has a long history of axial neck pain, left-sided, nothing overtly radicular, she was dropping objects but this has resolved, she has had therapy and chiropractic manipulation in the past both of which have helped. Now having recurrence of pain left-sided axial, better with postural training. Exam is normal. We discussed the anatomy and pathophysiology, we will start with 5 days of steroids, formal PT, updated x-rays, she can do some chiropractic manipulation if she would like, return to see me in 6 weeks, MRI for interventional planning if not better.  Chronic process with exacerbation and pharmacologic intervention  ____________________________________________ Joselyn Nicely. Sandy Crumb, M.D., ABFM., CAQSM., AME. Primary Care and Sports Medicine [facility]  Adjunct Professor of [facility] Medicine  [employer]  Restaurant manager, fast food

## 2024-04-21 ENCOUNTER — Telehealth: Admitting: Sports Medicine

## 2024-04-25 ENCOUNTER — Ambulatory Visit: Admitting: Physical Therapy

## 2024-04-27 ENCOUNTER — Telehealth: Payer: Self-pay | Admitting: Cardiovascular Disease

## 2024-04-27 NOTE — Telephone Encounter (Signed)
 Returned call to patient,   Patient states something weird has been going on with her Clonidine . She states it is making her sound very hoarse, irritating her gums and giving her dry mouth.   She states BP is still in the 140s, at an appointment last week it was 160/100. She states every once in a while it will drop into the high 130s.   Pt states she called about this medication in March and was never called back. At that time pt was having dizziness, dry mouth and increased fatigue. Per patient these are all still concerns she is having.   Advised will route to the care team and call back with recommendations.

## 2024-04-27 NOTE — Telephone Encounter (Signed)
 Pt c/o medication issue:  1. Name of Medication: cloNIDine  (CATAPRES ) 0.1 MG tablet   2. How are you currently taking this medication (dosage and times per day)? As written   3. Are you having a reaction (difficulty breathing--STAT)? No   4. What is your medication issue? Pt states it is causing her to have dry mouth which is irritating her gums. Please advise.   She also expressed concern that she called around March about a different issue with this med and did not receive a f/u.

## 2024-05-02 NOTE — Telephone Encounter (Signed)
 The clonidine  could be causing these issues.  Would she be willing to try a clonidine  patch once weekly.  It is much less likely to cause the side effects, and gives a nice even dose of medication over the course of a week.

## 2024-05-02 NOTE — Telephone Encounter (Signed)
 Returned call to pt, no answer, left message   "The clonidine  could be causing these issues.  Would she be willing to try a clonidine  patch once weekly.  It is much less likely to cause the side effects, and gives a nice even dose of medication over the course of a week. "

## 2024-05-03 NOTE — Telephone Encounter (Signed)
 2nd call attempt, reviewed recommendations with patient. She is open to trying the patch, but wants to know if any of her other medications come in patch form,if so she would like to try those. She states oral pills are hard for her because she forgets to take them.

## 2024-05-05 NOTE — Telephone Encounter (Signed)
 Agree with Kristin's recommendation.  I am not aware of any other patch options.  Options for her are very limited due to many medication intolerances.   Nashawn Hillock C. Theodis Fiscal, MD, FACC

## 2024-05-09 ENCOUNTER — Ambulatory Visit: Admitting: Sports Medicine

## 2024-05-11 ENCOUNTER — Ambulatory Visit

## 2024-05-15 ENCOUNTER — Ambulatory Visit: Payer: Self-pay

## 2024-05-15 MED ORDER — CLONIDINE 0.1 MG/24HR TD PTWK: 0.1000 mg | MEDICATED_PATCH | TRANSDERMAL | 12 refills | Status: DC

## 2024-05-15 NOTE — Telephone Encounter (Addendum)
 Returned call to pt., reviewed recommendations with patient, rx updated. Pt requested list of her medication intolerances.

## 2024-05-15 NOTE — Addendum Note (Signed)
 Addended by: Guss Legacy on: 05/15/2024 08:38 AM   Modules accepted: Orders

## 2024-05-15 NOTE — Telephone Encounter (Signed)
 Patient called to follow-up on next steps regarding her medication.

## 2024-05-15 NOTE — Telephone Encounter (Signed)
 Copied From CRM [id number]. Reason for Triage: stressed/ blood pressure was checked today 157/97 patient stated it keeps spiking. [phone]  Chief Complaint: Elevated BP Symptoms: Stress, slight headache off and on Frequency: A few days Pertinent Negatives: Patient denies chest pain Disposition: [] ED /[] Urgent Care (no appt availability in office) / [x] Appointment(In office/virtual)/ []  [place] [facility]/ [] Home Care/ [] Refused Recommended Disposition /[] [facility]/ [x]  Follow-up with PCP Additional Notes: Patient called in because she is concerned about her blood pressure. Patient stated her blood pressure has been running high for the past few days. Patient reported most recent readings of 157/97 30 minutes ago and 145/85 earlier this morning. Patient has a history of high blood pressure and is currently taking medications to treat it. Patient denied symptoms at this time. Advised patient to follow-up with provider within 2 weeks to talk about medication adjustment, per protocol. No availability within timeframe. Patient is scheduled for 06/07/24 with PCP, but please contact patient to schedule sooner. Patient stated she is also stressed and feels that stress is making her blood pressure run higher. Patient is requesting a medication to be called in for stress. Advised that I would route medication request to provider. Please advise.    Reason for Disposition  [1] Systolic BP  >= 130 OR Diastolic >= 80 AND [2] taking BP medications  Answer Assessment - Initial Assessment Questions 1. BLOOD PRESSURE: "What is the blood pressure?" "Did you take at least two measurements 5 minutes apart?"     157/97 taken 30 minutes ago, 145/85 earlier this morning  2. ONSET: "When did you take your blood pressure?"     "On and off" for last few days 3. HOW: "How did you take your blood pressure?" (e.g., automatic home BP monitor, visiting nurse)     High 130s to low 140s 4. HISTORY: "Do you  have a history of high blood pressure?"     Yes  5. MEDICINES: "Are you taking any medicines for blood pressure?" "Have you missed any doses recently?"     Carvedilol , valsartan  and clonidine   6. OTHER SYMPTOMS: "Do you have any symptoms?" (e.g., blurred vision, chest pain, difficulty breathing, headache, weakness)     Denies blurred vision, denies chest pain, denies difficulty breathing, denies weakness Slight headache on and off  Protocols used: Blood Pressure - High-A-AH

## 2024-05-22 ENCOUNTER — Other Ambulatory Visit: Payer: Self-pay | Admitting: Internal Medicine

## 2024-05-22 DIAGNOSIS — Z1231 Encounter for screening mammogram for malignant neoplasm of breast: Secondary | ICD-10-CM

## 2024-05-30 ENCOUNTER — Ambulatory Visit
Admission: RE | Admit: 2024-05-30 | Discharge: 2024-05-30 | Disposition: A | Discharge status: Home / Self Care | Source: Ambulatory Visit | Attending: Internal Medicine | Admitting: Internal Medicine

## 2024-05-30 ENCOUNTER — Other Ambulatory Visit: Payer: Self-pay | Admitting: Family Medicine

## 2024-05-30 DIAGNOSIS — Z1231 Encounter for screening mammogram for malignant neoplasm of breast: Secondary | ICD-10-CM

## 2024-05-31 ENCOUNTER — Encounter (HOSPITAL_BASED_OUTPATIENT_CLINIC_OR_DEPARTMENT_OTHER): Admitting: Cardiovascular Disease

## 2024-06-05 ENCOUNTER — Telehealth: Payer: Self-pay | Admitting: Family Medicine

## 2024-06-05 NOTE — Telephone Encounter (Signed)
 Copied from CRM [id number]. Topic: Clinical - Medical Advice >> Jun 05, 2024 12:48 PM Tiffany S wrote: Reason for CRM: Please follow up with the patient about blood pressure patient would like for Dr Tanda to call her

## 2024-06-07 ENCOUNTER — Other Ambulatory Visit: Payer: Self-pay | Admitting: Family Medicine

## 2024-06-07 ENCOUNTER — Ambulatory Visit: Admitting: Family Medicine

## 2024-06-07 MED ORDER — VALSARTAN 40 MG PO TABS: 40.0000 mg | ORAL_TABLET | Freq: Two times a day (BID) | ORAL | 1 refills | Status: DC

## 2024-06-07 MED ORDER — CLONIDINE 0.1 MG/24HR TD PTWK: 0.1000 mg | MEDICATED_PATCH | TRANSDERMAL | 12 refills | Status: DC

## 2024-06-07 MED ORDER — CARVEDILOL 12.5 MG PO TABS: 12.5000 mg | ORAL_TABLET | Freq: Two times a day (BID) | ORAL | 1 refills | Status: DC

## 2024-07-19 ENCOUNTER — Ambulatory Visit: Admitting: Family Medicine

## 2024-08-13 ENCOUNTER — Other Ambulatory Visit: Payer: Self-pay | Admitting: Cardiology

## 2024-08-13 MED ORDER — CARVEDILOL 12.5 MG PO TABS: 12.5000 mg | ORAL_TABLET | Freq: Two times a day (BID) | ORAL | 0 refills | Status: DC

## 2024-08-13 NOTE — Progress Notes (Signed)
 Patient called in requesting refill sent to different pharmacy as her normal is closed for the holiday. Coreg  12.5mg  BID #[id number], no refills sent to [facility]

## 2024-08-15 ENCOUNTER — Encounter: Payer: Self-pay | Admitting: Sports Medicine

## 2024-09-04 ENCOUNTER — Encounter (HOSPITAL_BASED_OUTPATIENT_CLINIC_OR_DEPARTMENT_OTHER): Admitting: Cardiovascular Disease

## 2024-09-08 ENCOUNTER — Telehealth (HOSPITAL_BASED_OUTPATIENT_CLINIC_OR_DEPARTMENT_OTHER): Payer: Self-pay | Admitting: Cardiovascular Disease

## 2024-09-08 MED ORDER — VALSARTAN 40 MG PO TABS: 40.0000 mg | ORAL_TABLET | Freq: Every evening | ORAL | 3 refills | Status: DC

## 2024-09-08 MED ORDER — CARVEDILOL 12.5 MG PO TABS: 12.5000 mg | ORAL_TABLET | Freq: Two times a day (BID) | ORAL | 3 refills | Status: AC

## 2024-09-08 NOTE — Telephone Encounter (Signed)
 Pt c/o medication issue:  1. Name of Medication:   Amlodipine   2. How are you currently taking this medication (dosage and times per day)?   3. Are you having a reaction (difficulty breathing--STAT)?   4. What is your medication issue?   Patient stated her nephrologist recently added this medication.  Patient wants a call back to discuss dosage changes.

## 2024-09-08 NOTE — Telephone Encounter (Signed)
 Spoke with patient and nephrologist stopped Clonidine  and started Amlodipine   Was wanting to know what kind of reaction she had with the Amlodipine   Added amlodipine  2.5 mg daily and started rosuvastatin  5 mg daily. She complained of blurry vision and fatigue and wished to stop the amlodipine  as well.  Reviewed this and allergy saying didn't feel well taking Patient is currently taking the Carvedilol  and Amlodipine  in the morning and Carvedilol  and Valsartan  in evening  Per patient blood pressure doing well so she will continue

## 2024-10-16 ENCOUNTER — Encounter: Payer: Self-pay | Admitting: Radiology

## 2024-10-23 ENCOUNTER — Inpatient Hospital Stay: Attending: Oncology | Admitting: Oncology

## 2024-10-23 ENCOUNTER — Encounter: Payer: Self-pay | Admitting: Oncology

## 2024-10-23 ENCOUNTER — Inpatient Hospital Stay

## 2024-10-23 VITALS — BP 158/82 | HR 64 | Temp 98.1°F | Resp 15 | Ht 65.0 in | Wt 181.9 lb

## 2024-10-23 DIAGNOSIS — D631 Anemia in chronic kidney disease: Secondary | ICD-10-CM | POA: Insufficient documentation

## 2024-10-23 DIAGNOSIS — Z800 Family history of malignant neoplasm of digestive organs: Secondary | ICD-10-CM | POA: Diagnosis not present

## 2024-10-23 DIAGNOSIS — I129 Hypertensive chronic kidney disease with stage 1 through stage 4 chronic kidney disease, or unspecified chronic kidney disease: Secondary | ICD-10-CM | POA: Diagnosis not present

## 2024-10-23 DIAGNOSIS — I10 Essential (primary) hypertension: Secondary | ICD-10-CM

## 2024-10-23 DIAGNOSIS — N1831 Chronic kidney disease, stage 3a: Secondary | ICD-10-CM | POA: Diagnosis not present

## 2024-10-23 DIAGNOSIS — Z79899 Other long term (current) drug therapy: Secondary | ICD-10-CM | POA: Insufficient documentation

## 2024-10-23 DIAGNOSIS — M1990 Unspecified osteoarthritis, unspecified site: Secondary | ICD-10-CM | POA: Insufficient documentation

## 2024-10-23 DIAGNOSIS — D72819 Decreased white blood cell count, unspecified: Secondary | ICD-10-CM | POA: Diagnosis present

## 2024-10-23 LAB — CBC WITH DIFFERENTIAL (CANCER CENTER ONLY)
Abs Immature Granulocytes: 0.01 K/uL (ref 0.00–0.07)
Basophils Absolute: 0 K/uL (ref 0.0–0.1)
Basophils Relative: 1 %
Eosinophils Absolute: 0.1 K/uL (ref 0.0–0.5)
Eosinophils Relative: 4 %
HCT: 33.6 % — ABNORMAL LOW (ref 36.0–46.0)
Hemoglobin: 10.9 g/dL — ABNORMAL LOW (ref 12.0–15.0)
Immature Granulocytes: 0 %
Lymphocytes Relative: 44 %
Lymphs Abs: 1.2 K/uL (ref 0.7–4.0)
MCH: 28.5 pg (ref 26.0–34.0)
MCHC: 32.4 g/dL (ref 30.0–36.0)
MCV: 87.7 fL (ref 80.0–100.0)
Monocytes Absolute: 0.2 K/uL (ref 0.1–1.0)
Monocytes Relative: 7 %
Neutro Abs: 1.2 K/uL — ABNORMAL LOW (ref 1.7–7.7)
Neutrophils Relative %: 44 %
Platelet Count: 151 K/uL (ref 150–400)
RBC: 3.83 MIL/uL — ABNORMAL LOW (ref 3.87–5.11)
RDW: 13.2 % (ref 11.5–15.5)
WBC Count: 2.8 K/uL — ABNORMAL LOW (ref 4.0–10.5)
nRBC: 0 % (ref 0.0–0.2)

## 2024-10-23 LAB — CMP (CANCER CENTER ONLY)
ALT: 16 U/L (ref 0–44)
AST: 22 U/L (ref 15–41)
Albumin: 4.2 g/dL (ref 3.5–5.0)
Alkaline Phosphatase: 64 U/L (ref 38–126)
Anion gap: 9 (ref 5–15)
BUN: 17 mg/dL (ref 6–20)
CO2: 26 mmol/L (ref 22–32)
Calcium: 9.4 mg/dL (ref 8.9–10.3)
Chloride: 107 mmol/L (ref 98–111)
Creatinine: 1.12 mg/dL — ABNORMAL HIGH (ref 0.44–1.00)
GFR, Estimated: 56 mL/min — ABNORMAL LOW (ref >60)
Glucose, Bld: 77 mg/dL (ref 70–99)
Potassium: 3.7 mmol/L (ref 3.5–5.1)
Sodium: 142 mmol/L (ref 135–145)
Total Bilirubin: 0.5 mg/dL (ref 0.0–1.2)
Total Protein: 7.5 g/dL (ref 6.5–8.1)

## 2024-10-23 LAB — VITAMIN B12: Vitamin B-12: 482 pg/mL (ref 180–914)

## 2024-10-23 LAB — TSH: TSH: 1.88 u[IU]/mL (ref 0.350–4.500)

## 2024-10-23 LAB — FERRITIN: Ferritin: 35 ng/mL (ref 11–307)

## 2024-10-23 LAB — FOLATE: Folate: >20 ng/mL (ref >5.9)

## 2024-10-23 LAB — IRON AND TIBC
Iron: 70 ug/dL (ref 28–170)
Saturation Ratios: 23 % (ref 10.4–31.8)
TIBC: 311 ug/dL (ref 250–450)
UIBC: 241 ug/dL

## 2024-10-23 LAB — LACTATE DEHYDROGENASE: LDH: 166 U/L (ref 98–192)

## 2024-10-23 NOTE — Progress Notes (Signed)
 [facility]  HEMATOLOGY CLINIC CONSULTATION NOTE   PATIENT NAME: Heidi Herrera   MR#: [id number] DOB: 11-Mar-1966  DATE OF SERVICE: 10/23/2024   REFERRING PROVIDER  Dron Dolan, MD  Patient Care Team: Tanda Bleacher, MD as PCP - General (Family Medicine) Raford Riggs, MD as PCP - Cardiology (Cardiology) Dolan Mateo Larger, MD as Consulting Physician (Nephrology) Georjean Darice HERO, MD as Consulting Physician (Neurology) Curt Lighter, MD (Rheumatology)   REASON FOR CONSULTATION/ CHIEF COMPLAINT:  Evaluation of leukopenia  ASSESSMENT & PLAN:  Heidi Herrera is a 58 y.o. lady with a past medical history of hypertension, iron  deficiency anemia, celiac disease, dyslipidemia, nephrolithiasis, horseshoe polycystic kidney, CKD stage 3a, was referred to our service for evaluation of chronic, intermittent leukopenia.    Leukopenia On 07/26/2024, labs at her nephrologist office showed white count of 2800 with ANC of 1300, normal differential.  Hemoglobin 11.4, platelet count 173,000.  She was referred to us  for further evaluation of leukopenia.  Previously labs in April 2024 showed white count of 2900, ANC of 1500, normal differential.  On review of available records, she has had chronic, intermittent leukopenia with white count in the range of 3100-5000, at least since 2009.  Chronic mild leukopenia with white blood cell count around 4,000, occasionally dropping to 2,800. Neutrophil count remains safe at 1,300. No recurrent infections, fevers, chills, or night sweats. Differential diagnosis includes benign leukopenia, vitamin deficiencies, and bone marrow function issues. Recent rheumatology evaluation ruled out lupus and other autoimmune conditions. No new medications or significant dietary changes affecting white blood cell count. Possible benign leukopenia.  White count today is 2800 with ANC of 1200, normal differential.  Hemoglobin 10.9, hematocrit 33.6,  MCV 87.7.  Platelet count normal at 151,000.  Creatinine 1.12, otherwise unremarkable CMP.  Ferritin normal at 35.  TSH, LDH normal.  Today I did check iron  studies, B12, folate, methylmalonic acid, flow cytometry of peripheral blood to rule out other possible etiologies.  We also checked monoclonal gammopathy workup.  - Will review results in a couple of weeks and follow up with a phone call.  - Scheduled follow-up appointment in three months, with potential for earlier visit depending on test results.  Anemia in stage 3a chronic kidney disease (HCC) Chronic kidney disease stage 3a with fluctuating hemoglobin levels, likely due to decreased erythropoietin production. Hemoglobin levels around 11-12, slightly lower than normal range for women. No significant fatigue reported, which is more common with lower hemoglobin levels. - Continue monitoring hemoglobin levels in conjunction with kidney function.  Essential hypertension Hypertension difficult to control despite current medication regimen including valsartan , carvedilol , and clonidine . Blood pressure readings remain high. No direct association between low white blood cell count and hypertension control issues.  - Continue current antihypertensive regimen.    I reviewed lab results and outside records for this visit and discussed relevant results with the patient. Diagnosis, plan of care and treatment options were also discussed in detail with the patient. Opportunity provided to ask questions and answers provided to her apparent satisfaction. Provided instructions to call our clinic with any problems, questions or concerns prior to return visit. I recommended to continue follow-up with PCP and sub-specialists. She verbalized understanding and agreed with the plan. No barriers to learning was detected.  Chinita Patten, MD  10/23/2024 4:57 PM  [facility] [id number] [place] [address] Dept:  [phone] Dept Fax: [phone]   HISTORY OF PRESENT ILLNESS:  Discussed the use of AI scribe software for clinical note transcription with the patient, who gave verbal consent to proceed.  History of Present Illness Heidi Herrera is a 58 year old female with chronic kidney disease who presents for evaluation of low white blood cell count. She was referred by Dr. Dolan for evaluation of her low white blood cell count.  On 07/26/2024, labs at her nephrologist office showed white count of 2800 with ANC of 1300, normal differential.  Hemoglobin 11.4, platelet count 173,000.  She was referred to us  for further evaluation of leukopenia.  Previously labs in April 2024 showed white count of 2900, ANC of 1500, normal differential.  On review of available records, she has had chronic, intermittent leukopenia with white count in the range of 3100-5000, at least since 2009.  She has been experiencing a low white blood cell count, first noted by her nephrologist, with fluctuations in her white blood cell count, reaching as low as 2,800 in August. Historically, her white count has been around 4,000, with occasional drops to 3,100 and 3,700. No recurrent infections, fevers, chills, or night sweats. She experiences occasional sinus infections but nothing major. She has noticed some skin changes, describing 'little white patches' present for a couple of years, but denies significant itching apart from dry skin.  She has a history of osteoarthritis, diagnosed by a rheumatologist, and experiences neck and lower back pain. She has been tested for lupus and other autoimmune conditions, which were negative. There is a family history of rheumatoid arthritis.  Her current medications include valsartan , Coreg  (carvedilol ), and clonidine  for blood pressure management. She also takes ginger for digestive issues and follows a plant-based diet since 2019 due to  kidney issues and digestive problems. She has a known gluten sensitivity.  She reports difficulty in controlling her blood pressure, with high readings noted even with medication. She experiences fatigue, which she wonders might be related to her low blood count. Her hemoglobin levels have been between 11 and 12.  She denies fever, cough, diarrhea, or other infectious symptoms.  She denies epistaxis, bloody stool, melena, hematuria, bruising or other bleeding symptoms. She also denies unintentional weight loss, night sweats or other constitutional symptoms.  MEDICAL HISTORY Past Medical History:  Diagnosis Date   Acid reflux    Allergy    Anemia    Anxiety    Fibroids    GERD (gastroesophageal reflux disease)    H. pylori infection    Hypertension    Kidney failure    stage 2   Migraine    Obesity    Palpitations 07/07/2023   Polycystic kidney disease    Pure hypercholesterolemia 07/07/2023   Sebaceous cyst    Sessile colonic polyp    Vertigo      SURGICAL HISTORY Past Surgical History:  Procedure Laterality Date   IR GENERIC HISTORICAL  12/01/2016   IR ANGIOGRAM PELVIS SELECTIVE OR SUPRASELECTIVE 12/01/2016 Norleen Roulette, MD [facility]   IR GENERIC HISTORICAL  12/01/2016   IR US  GUIDE VASC ACCESS RIGHT 12/01/2016 Norleen Roulette, MD [facility]   IR GENERIC HISTORICAL  12/01/2016   IR EMBO ARTERIAL NOT HEMORR HEMANG INC GUIDE ROADMAPPING 12/01/2016 Norleen Roulette, MD [facility]   IR GENERIC HISTORICAL  12/01/2016   IR ANGIOGRAM SELECTIVE EACH ADDITIONAL VESSEL 12/01/2016 Norleen Roulette, MD [facility]   IR GENERIC HISTORICAL  12/01/2016   IR ANGIOGRAM SELECTIVE EACH ADDITIONAL VESSEL  12/01/2016 Norleen Roulette, MD [facility]   IR GENERIC HISTORICAL  12/01/2016   IR ANGIOGRAM PELVIS SELECTIVE OR SUPRASELECTIVE 12/01/2016 Norleen Roulette, MD [facility]   IR GENERIC HISTORICAL  01/07/2017   IR RADIOLOGIST EVAL & MGMT 01/07/2017 Norleen Roulette, MD [facility]   IR RADIOLOGIST EVAL &  MGMT  08/31/2017   LOBECTOMY  2009   MYOMECTOMY  2007   UTERINE FIBROID EMBOLIZATION     12/01/2016     SOCIAL HISTORY: She reports that she has never smoked. She has never used smokeless tobacco. She reports that she does not currently use alcohol after a past usage of about 1.0 standard drink of alcohol per week. She reports that she does not use drugs. Social History   Socioeconomic History   Marital status: Divorced    Spouse name: Not on file   Number of children: 1   Years of education: Not on file   Highest education level: Master's degree (e.g., MA, MS, MEng, MEd, MSW, MBA)  Occupational History    Comment: [employer]  Tobacco Use   Smoking status: Never   Smokeless tobacco: Never  Vaping Use   Vaping status: Never Used  Substance and Sexual Activity   Alcohol use: Not Currently    Alcohol/week: 1.0 standard drink of alcohol    Types: 1 Glasses of wine per week    Comment: quit years ago   Drug use: No   Sexual activity: Not on file  Other Topics Concern   Not on file  Social History Narrative   Lives alone   No caffiene   Right handed   Two story and steps inside   Social Drivers of Health   Financial Resource Strain: Low Risk  (02/24/2024)   Overall Financial Resource Strain (CARDIA)    Difficulty of Paying Living Expenses: Not hard at all  Food Insecurity: No Food Insecurity (10/23/2024)   Hunger Vital Sign    Worried About Running Out of Food in the Last Year: Never true    Ran Out of Food in the Last Year: Never true  Transportation Needs: No Transportation Needs (10/23/2024)   PRAPARE - Administrator, Civil Service (Medical): No    Lack of Transportation (Non-Medical): No  Physical Activity: Insufficiently Active (02/24/2024)   Exercise Vital Sign    Days of Exercise per Week: 3 days    Minutes of Exercise per Session: 30 min  Stress: No Stress Concern Present (02/24/2024)   [employer] of Occupational Health - Occupational Stress  Questionnaire    Feeling of Stress : Not at all  Social Connections: Moderately Isolated (02/24/2024)   Social Connection and Isolation Panel    Frequency of Communication with Friends and Family: Three times a week    Frequency of Social Gatherings with Friends and Family: Once a week    Attends Religious Services: More than 4 times per year    Active Member of [employer] or Organizations: No    Attends Banker Meetings: Never    Marital Status: Divorced  Catering Manager Violence: Not At Risk (10/23/2024)   Humiliation, Afraid, Rape, and Kick questionnaire    Fear of Current or Ex-Partner: No    Emotionally Abused: No    Physically Abused: No    Sexually Abused: No    FAMILY HISTORY: Her family history includes Cancer in her brother; Cancer (age of onset: 36) in her sister; Diabetes in her mother, sister, and sister; Esophageal cancer in  her maternal grandfather; Glaucoma in her brother, maternal uncle, and sister; Heart attack in her sister; Heart disease in her maternal uncle; Hyperlipidemia in her sister; Hypertension in her brother, father, mother, sister, and sister; Kidney Stones in her mother; Mental illness in her father, sister, and sister; Obesity in her brother, mother, and sister; Pancreatic cancer in her cousin; Stroke in her brother, brother, and mother.  CURRENT MEDICATIONS   Current Outpatient Medications  Medication Instructions   ALPRAZolam (XANAX) 0.25 mg, Oral, As needed   carvedilol  (COREG ) 12.5 mg, Oral, 2 times daily   Cholecalciferol (VITAMIN D3) 125 MCG (5000 UT) CAPS 1 capsule, Daily   cloNIDine  (CATAPRES ) 0.1 mg, 2 times daily   loratadine (CLARITIN) 10 mg, Daily   meclizine  (ANTIVERT ) 25 mg, As needed   Multiple Vitamin (MULTIVITAMIN WITH MINERALS) TABS tablet 1 tablet, Daily   valsartan  (DIOVAN ) 40 mg, Oral, Every evening     ALLERGIES  She is allergic to bystolic  [nebivolol  hcl], amitriptyline, amlodipine , biaxin [clarithromycin], eplerenone ,  hydralazine , lisinopril , peanut-containing drug products, valsartan , aspartame, and erythromycin base.  REVIEW OF SYSTEMS:  Review of Systems - Oncology   Rest of the pertinent review of systems is unremarkable except as mentioned above in HPI.  PHYSICAL EXAMINATION:   Onc Performance Status - 10/23/24 1400       ECOG Perf Status   ECOG Perf Status Fully active, able to carry on all pre-disease performance without restriction      KPS SCALE   KPS % SCORE Normal, no compliants, no evidence of disease          Vitals:   10/23/24 1358 10/23/24 1402  BP: (!) 173/89 (!) 158/82  Pulse: 64   Resp: 15   Temp: 98.1 F (36.7 C)   SpO2: 99%    Filed Weights   10/23/24 1358  Weight: 181 lb 14.4 oz (82.5 kg)    Physical Exam Constitutional:      General: She is not in acute distress.    Appearance: Normal appearance.  HENT:     Head: Normocephalic and atraumatic.  Cardiovascular:     Rate and Rhythm: Normal rate.  Pulmonary:     Effort: Pulmonary effort is normal. No respiratory distress.  Abdominal:     General: There is no distension.  Neurological:     General: No focal deficit present.     Mental Status: She is alert and oriented to person, place, and time.  Psychiatric:        Mood and Affect: Mood normal.        Behavior: Behavior normal.      LABORATORY DATA:   I have reviewed the data as listed.  Results for orders placed or performed in visit on 10/23/24  TSH  Result Value Ref Range   TSH 1.880 0.350 - 4.500 uIU/mL  Ferritin  Result Value Ref Range   Ferritin 35 11 - 307 ng/mL  Lactate dehydrogenase  Result Value Ref Range   LDH 166 98 - 192 U/L  CMP (Cancer Center only)  Result Value Ref Range   Sodium 142 135 - 145 mmol/L   Potassium 3.7 3.5 - 5.1 mmol/L   Chloride 107 98 - 111 mmol/L   CO2 26 22 - 32 mmol/L   Glucose, Bld 77 70 - 99 mg/dL   BUN 17 6 - 20 mg/dL   Creatinine 8.87 (H) 9.55 - 1.00 mg/dL   Calcium  9.4 8.9 - 10.3 mg/dL    Total Protein 7.5 6.5 -  8.1 g/dL   Albumin 4.2 3.5 - 5.0 g/dL   AST 22 15 - 41 U/L   ALT 16 0 - 44 U/L   Alkaline Phosphatase 64 38 - 126 U/L   Total Bilirubin 0.5 0.0 - 1.2 mg/dL   GFR, Estimated 56 (L) >60 mL/min   Anion gap 9 5 - 15  CBC with Differential (Cancer Center Only)  Result Value Ref Range   WBC Count 2.8 (L) 4.0 - 10.5 K/uL   RBC 3.83 (L) 3.87 - 5.11 MIL/uL   Hemoglobin 10.9 (L) 12.0 - 15.0 g/dL   HCT 66.3 (L) 63.9 - 53.9 %   MCV 87.7 80.0 - 100.0 fL   MCH 28.5 26.0 - 34.0 pg   MCHC 32.4 30.0 - 36.0 g/dL   RDW 86.7 88.4 - 84.4 %   Platelet Count 151 150 - 400 K/uL   nRBC 0.0 0.0 - 0.2 %   Neutrophils Relative % 44 %   Neutro Abs 1.2 (L) 1.7 - 7.7 K/uL   Lymphocytes Relative 44 %   Lymphs Abs 1.2 0.7 - 4.0 K/uL   Monocytes Relative 7 %   Monocytes Absolute 0.2 0.1 - 1.0 K/uL   Eosinophils Relative 4 %   Eosinophils Absolute 0.1 0.0 - 0.5 K/uL   Basophils Relative 1 %   Basophils Absolute 0.0 0.0 - 0.1 K/uL   Immature Granulocytes 0 %   Abs Immature Granulocytes 0.01 0.00 - 0.07 K/uL     RADIOGRAPHIC STUDIES:  No pertinent imaging studies available to review.  Orders Placed This Encounter  Procedures   CBC with Differential (Cancer Center Only)    Standing Status:   Future    Number of Occurrences:   1    Expiration Date:   10/23/2025   CMP (Cancer Center only)    Standing Status:   Future    Number of Occurrences:   1    Expiration Date:   10/23/2025   Lactate dehydrogenase    Standing Status:   Future    Number of Occurrences:   1    Expiration Date:   10/23/2025   Iron  and TIBC    Standing Status:   Future    Number of Occurrences:   1    Expiration Date:   10/23/2025   Ferritin    Standing Status:   Future    Number of Occurrences:   1    Expiration Date:   10/23/2025   Vitamin B12    Standing Status:   Future    Number of Occurrences:   1    Expiration Date:   10/23/2025   Folate    Standing Status:   Future    Number of Occurrences:    1    Expiration Date:   10/23/2025   TSH    Standing Status:   Future    Number of Occurrences:   1    Expiration Date:   10/23/2025   Flow Cytometry, Peripheral Blood (Oncology)    Standing Status:   Future    Number of Occurrences:   1    Expiration Date:   10/23/2025   Multiple Myeloma Panel (SPEP&IFE w/QIG)    Standing Status:   Future    Number of Occurrences:   1    Expiration Date:   10/23/2025   Kappa/lambda light chains    Standing Status:   Future    Number of Occurrences:   1    Expiration Date:  10/23/2025   Methylmalonic acid, serum    Standing Status:   Future    Number of Occurrences:   1    Expiration Date:   10/23/2025    Future Appointments  Date Time Provider Department Center  11/01/2024  1:00 PM Mercer Clotilda SAUNDERS, MD [facility] [address]  11/06/2024  3:30 PM Autumn Millman, MD [facility] None  11/29/2024  1:45 PM Raford Riggs, MD [facility] [id number]  01/22/2025 11:30 AM [facility] PHLEBOTOMIST [facility] None  01/22/2025 11:45 AM Joellyn Grandt, Millman, MD [facility] None    I spent a total of 55 minutes during this encounter with the patient including review of chart and various tests results, discussions about plan of care and coordination of care plan.  This document was completed utilizing speech recognition software. Grammatical errors, random word insertions, pronoun errors, and incomplete sentences are an occasional consequence of this system due to software limitations, ambient noise, and hardware issues. Any formal questions or concerns about the content, text or information contained within the body of this dictation should be directly addressed to the provider for clarification.

## 2024-10-23 NOTE — Assessment & Plan Note (Signed)
 Hypertension difficult to control despite current medication regimen including valsartan , carvedilol , and clonidine . Blood pressure readings remain high. No direct association between low white blood cell count and hypertension control issues.  - Continue current antihypertensive regimen.

## 2024-10-23 NOTE — Assessment & Plan Note (Signed)
 Chronic kidney disease stage 3a with fluctuating hemoglobin levels, likely due to decreased erythropoietin production. Hemoglobin levels around 11-12, slightly lower than normal range for women. No significant fatigue reported, which is more common with lower hemoglobin levels. - Continue monitoring hemoglobin levels in conjunction with kidney function.

## 2024-10-23 NOTE — Assessment & Plan Note (Addendum)
 On 07/26/2024, labs at her nephrologist office showed white count of 2800 with ANC of 1300, normal differential.  Hemoglobin 11.4, platelet count 173,000.  She was referred to us  for further evaluation of leukopenia.  Previously labs in April 2024 showed white count of 2900, ANC of 1500, normal differential.  On review of available records, she has had chronic, intermittent leukopenia with white count in the range of 3100-5000, at least since 2009.  Chronic mild leukopenia with white blood cell count around 4,000, occasionally dropping to 2,800. Neutrophil count remains safe at 1,300. No recurrent infections, fevers, chills, or night sweats. Differential diagnosis includes benign leukopenia, vitamin deficiencies, and bone marrow function issues. Recent rheumatology evaluation ruled out lupus and other autoimmune conditions. No new medications or significant dietary changes affecting white blood cell count. Possible benign leukopenia.  White count today is 2800 with ANC of 1200, normal differential.  Hemoglobin 10.9, hematocrit 33.6, MCV 87.7.  Platelet count normal at 151,000.  Creatinine 1.12, otherwise unremarkable CMP.  Ferritin normal at 35.  TSH, LDH normal.  Today I did check iron  studies, B12, folate, methylmalonic acid, flow cytometry of peripheral blood to rule out other possible etiologies.  We also checked monoclonal gammopathy workup.  - Will review results in a couple of weeks and follow up with a phone call.  - Scheduled follow-up appointment in three months, with potential for earlier visit depending on test results.

## 2024-10-24 LAB — KAPPA/LAMBDA LIGHT CHAINS
Kappa free light chain: 49.9 mg/L — ABNORMAL HIGH (ref 3.3–19.4)
Kappa, lambda light chain ratio: 2.23 — ABNORMAL HIGH (ref 0.26–1.65)
Lambda free light chains: 22.4 mg/L (ref 5.7–26.3)

## 2024-10-24 LAB — SURGICAL PATHOLOGY

## 2024-10-27 LAB — MULTIPLE MYELOMA PANEL, SERUM
Albumin SerPl Elph-Mcnc: 3.6 g/dL (ref 2.9–4.4)
Albumin/Glob SerPl: 1.1 (ref 0.7–1.7)
Alpha 1: 0.2 g/dL (ref 0.0–0.4)
Alpha2 Glob SerPl Elph-Mcnc: 0.6 g/dL (ref 0.4–1.0)
B-Globulin SerPl Elph-Mcnc: 1 g/dL (ref 0.7–1.3)
Gamma Glob SerPl Elph-Mcnc: 1.7 g/dL (ref 0.4–1.8)
Globulin, Total: 3.5 g/dL (ref 2.2–3.9)
IgA: 220 mg/dL (ref 87–352)
IgG (Immunoglobin G), Serum: 1966 mg/dL — ABNORMAL HIGH (ref 586–1602)
IgM (Immunoglobulin M), Srm: 50 mg/dL (ref 26–217)
Total Protein ELP: 7.1 g/dL (ref 6.0–8.5)

## 2024-10-27 LAB — FLOW CYTOMETRY

## 2024-10-27 LAB — METHYLMALONIC ACID, SERUM: Methylmalonic Acid, Quantitative: 149 nmol/L (ref 0–378)

## 2024-10-30 ENCOUNTER — Telehealth (HOSPITAL_BASED_OUTPATIENT_CLINIC_OR_DEPARTMENT_OTHER): Payer: Self-pay | Admitting: Cardiovascular Disease

## 2024-10-30 NOTE — Telephone Encounter (Signed)
 Pt c/o BP issue: STAT if pt c/o blurred vision, one-sided weakness or slurred speech.  STAT if BP is GREATER than 180/120 TODAY.  STAT if BP is LESS than 90/60 and SYMPTOMATIC TODAY  1. What is your BP concern?   See below  2. Have you taken any BP medication today?   Yes  3. What are your last 5 BP readings?  11/17 am - 144/88 11/16 pm - 160/88 11/16       - 149/89 11/16       - 170/88 11/16       - 170/91  4. Are you having any other symptoms (ex. Dizziness, headache, blurred vision, passed out)?   Dizziness, nose bleed, dry mouth, very thirsty  Patient is concerned about her blood pressure and believes this may related to her BP medication.  Patient noted her BP fluctuates over the course of the day.

## 2024-10-30 NOTE — Telephone Encounter (Signed)
 Has sensation of flushing, dry mouth and lips and frequent urination.  Can't get enough to drink and bp is elevated.  I reviewed her medications at length w her.  The call lasted 25 minutes.  Her medication list is correct.  I scheduled her overdue follow up with Dr. Raford for Wed am.     She is asking what she can do in the meantime for the symptoms.  She is aware I will forward to Dr. Raford and if any recommendations we will call her back. _______________________________________________________

## 2024-11-01 ENCOUNTER — Encounter (HOSPITAL_BASED_OUTPATIENT_CLINIC_OR_DEPARTMENT_OTHER): Payer: Self-pay | Admitting: Cardiovascular Disease

## 2024-11-01 ENCOUNTER — Telehealth (HOSPITAL_BASED_OUTPATIENT_CLINIC_OR_DEPARTMENT_OTHER): Payer: Self-pay | Admitting: *Deleted

## 2024-11-01 ENCOUNTER — Ambulatory Visit: Admitting: Family Medicine

## 2024-11-01 ENCOUNTER — Encounter: Payer: Self-pay | Admitting: Family Medicine

## 2024-11-01 ENCOUNTER — Ambulatory Visit (HOSPITAL_BASED_OUTPATIENT_CLINIC_OR_DEPARTMENT_OTHER): Admitting: Cardiovascular Disease

## 2024-11-01 VITALS — BP 132/86 | HR 63 | Ht 65.0 in | Wt 177.6 lb

## 2024-11-01 VITALS — BP 152/90 | HR 66 | Temp 98.6°F | Ht 65.0 in | Wt 176.0 lb

## 2024-11-01 DIAGNOSIS — I10 Essential (primary) hypertension: Secondary | ICD-10-CM | POA: Diagnosis not present

## 2024-11-01 DIAGNOSIS — I700 Atherosclerosis of aorta: Secondary | ICD-10-CM

## 2024-11-01 DIAGNOSIS — M159 Polyosteoarthritis, unspecified: Secondary | ICD-10-CM

## 2024-11-01 DIAGNOSIS — M5030 Other cervical disc degeneration, unspecified cervical region: Secondary | ICD-10-CM

## 2024-11-01 DIAGNOSIS — N1831 Chronic kidney disease, stage 3a: Secondary | ICD-10-CM | POA: Diagnosis not present

## 2024-11-01 DIAGNOSIS — Z7689 Persons encountering health services in other specified circumstances: Secondary | ICD-10-CM

## 2024-11-01 DIAGNOSIS — M5136 Other intervertebral disc degeneration, lumbar region with discogenic back pain only: Secondary | ICD-10-CM

## 2024-11-01 DIAGNOSIS — I2510 Atherosclerotic heart disease of native coronary artery without angina pectoris: Secondary | ICD-10-CM

## 2024-11-01 DIAGNOSIS — K9041 Non-celiac gluten sensitivity: Secondary | ICD-10-CM

## 2024-11-01 DIAGNOSIS — Q613 Polycystic kidney, unspecified: Secondary | ICD-10-CM

## 2024-11-01 DIAGNOSIS — E7800 Pure hypercholesterolemia, unspecified: Secondary | ICD-10-CM

## 2024-11-01 DIAGNOSIS — Z902 Acquired absence of lung [part of]: Secondary | ICD-10-CM

## 2024-11-01 DIAGNOSIS — L729 Follicular cyst of the skin and subcutaneous tissue, unspecified: Secondary | ICD-10-CM

## 2024-11-01 DIAGNOSIS — Z91018 Allergy to other foods: Secondary | ICD-10-CM

## 2024-11-01 DIAGNOSIS — Z1211 Encounter for screening for malignant neoplasm of colon: Secondary | ICD-10-CM

## 2024-11-01 MED ORDER — LISINOPRIL 20 MG PO TABS: 20.0000 mg | ORAL_TABLET | Freq: Every day | ORAL | 3 refills | Status: AC

## 2024-11-01 NOTE — Progress Notes (Signed)
 [facility] Follow-up:    Date:  11/01/2024   ID:  Heidi Herrera, DOB 1966/02/27, MRN [id number]  PCP:  Tanda Bleacher, MD  Cardiologist:  Annabella Scarce, MD   Referring MD: Tanda Bleacher, MD   CC: Hypertension  History of Present Illness:    Heidi Herrera is a 58 y.o. female with hypertension, aortic atherosclerosis, non-obstructive CAD, CKD 3, polycystic kidney disease, horseshoe kidney, and hyperlipidemia, here for follow-up. She was initially seen 05/13/2022 to establish care.  She last saw Dr. Revenkar 07/2021 and complained of frequent urination.  Lisinopril  was increased while hydrochlorothiazide  was reduced.  At that visit her blood pressure was 138/84.  She previously had a nuclear stress test 06/2021 that revealed LVEF 58% with no ischemia.  She saw her PCP 01/2022 and was no longer on HCTZ.  She had also self discontinued lisinopril  and her blood pressure was 172/108.  It was recommended that she resume the lisinopril .  She followed up with the urologist and they started her on Vesicare for urinary frequency.     She saw a cardiologist at [facility] 01/2022 and reported palpitations.  She wore a 30-day event recorder which showed sinus tachycardia but no other significant arrhythmias.  Her BP was 168/98 and 154/84.  At the time she was only taking lisinopril .  She reported palpitations and had an event recorder that revealed sinus tachycardia in the middle of the night up to 140 bpm.  She was started on metoprolol succinate but still had some palpitations. Metoprolol was switched to carvedilol . She was enrolled in the remote patient monitoring study. She called the office stating that carvedilol  was causing her to have palpitations. She also had concerns with her memory, and was advised to discuss with hear PCP. She saw Josette Mink, PharmD 07/2022 and noted a burning pain in her liver an hour after taking lisinopril . She was switched to valsartan . It was noted that her  blood pressure readings in the office were much higher than in [facility].    She had an MRI done for her cervical spine. She was told that she might need surgery but she has been looking into alternative options. She met with a chiropractor. They told her that her spine pressing on her nerves could be the reason of her vision, blood pressure, and digestive issues. The pain on her back and neck feels like a pressure weighing her down and she hopes that by going along with this chiropractic treatment she could resolve her other issues. At the last visit her blood pressure was higher in the office than on home monitoring. Her renin and aldosterone levels were indeterminate, and spironolactone  was added. She called the office reporting side effects of spironolactone , and valsartan  was increased. Blood pressures in [facility] for the last 2 weeks have ranged from the 120s-160s over 80s-100s. It has mostly been around 140/80s-90s. She had an MRI at [facility] that revealed innumerable cysts in bilateral kidneys, suggestive of polycystic kidney disease and a likely horseshoe kidney. She also had multiple hepatic cysts and uterine fibroids. She had no adrenal adenomas.   At her 11/2022 visit her blood pressure continued to be labile, averaging in the 130s-140s systolic. We recommended adding indapamide, but she wanted to increase valsartan . Blood pressures in [facility] were averaging in the 130s/90s.   In 01/2023 blood pressures were well controlled at home. She continued to work on her diet and exercise. We referred her to PREP. Given chronic neck and back pain,  also recommended water exercises. She noted increased abdominal bloating since she increased valsartan  to twice daily.  She had also been diagnosed with gluten sensitivity. She planned to review her diet and focus on eliminating gluten before we make any changes to her medications. We also discussed renal denervation as a potential option. She completed the RPM study and  returned her patient monitoring device. She had a coronary CT 03/19/2023 revealing a coronary calcium  score of 40.2 and borderline aortic dilation at 38 mm (non-contrast).  At her visit 06/2023, she reported home blood pressures ranging from 130s-150s systolic, averaging 140s/80s-90s. She had been unable to tolerate carvedilol  and valsartan  at the same time, as this seemed to cause more frequent fluttering palpitations. She found that she needs to wait at least 30-40 minutes after taking valsartan  before taking the carvedilol . However, she still had occasional post-prandial palpitations, or randomly throughout the day. She complained of worsening shortness of breath with climbing her stairs at home. She was participating in the PREP program. We changed her carvedilol  to nebivolol  10 mg daily, however she reported a history of angioedema on nebivolol  so carvedilol  was continued. Added amlodipine  2.5 mg daily and started rosuvastatin  5 mg daily. She complained of blurry vision and fatigue and wished to stop the amlodipine  as well. It was recommended to try doxazosin 2 mg at bedtime but she declined as she reported taking this in the past.   At her visit 09/2023, Heidi Herrera noted short term memory issues and frequent urination which she attributed to her medication.  Valsartan  was reduced and clonidine  was added.  She had not started taking the recommended statin. Since her last appointment her nephrologist added amlodipine  08/2024.  She called the office due to a sensation of flushing, dry mouth, and frequent urination.  She was scheduled for an overdue follow-up appointment.  Discussed the use of AI scribe software for clinical note transcription with the patient, who gave verbal consent to proceed.  History of Present Illness Heidi Herrera experiences significant fluctuations in her blood pressure throughout the day, with morning readings around 155/84 mmHg before medication and spikes to approximately 178/84 mmHg  in the afternoons and late evenings. During these spikes, she experiences dry lips and mouth, dizziness, and a sensation of 'rushing adrenaline'.  She has been experiencing frequent epistaxis over the past couple of months, with three episodes occurring in the last week and a half. Initially, she was prescribed Claritin for a suspected sinus infection, which she has been taking, but the frequency of epistaxis has increased recently.  Her current medication regimen includes clonidine , taken around 7:30 AM and 7-8 PM. She reports experiencing dryness, which leads her to drink more water and results in frequent nocturia. She previously tried amlodipine  but discontinued it due to adverse effects on her focus and concentration. She also recalls using lisinopril  in the past without issues before it was recalled. Her nephrologist switched her to amlodipine  and valsartan , but she returned to clonidine  due to side effects.  Prior Antihypertensives: Irbesartan  Spironolactone  - urinary hesitancy Amlodipine  Eplerenone  Hydralazine  HCTZ Lisinopril  Metoprolol Nebivolol  - angioedema  Past Medical History:  Diagnosis Date   Acid reflux    Allergy    Anemia    Anxiety    Fibroids    GERD (gastroesophageal reflux disease)    H. pylori infection    Hypertension    Kidney failure    stage 2   Migraine    Obesity    Palpitations 07/07/2023  Polycystic kidney disease    Pure hypercholesterolemia 07/07/2023   Sebaceous cyst    Sessile colonic polyp    Vertigo     Past Surgical History:  Procedure Laterality Date   IR GENERIC HISTORICAL  12/01/2016   IR ANGIOGRAM PELVIS SELECTIVE OR SUPRASELECTIVE 12/01/2016 Norleen Roulette, MD [facility]   IR GENERIC HISTORICAL  12/01/2016   IR US  GUIDE VASC ACCESS RIGHT 12/01/2016 Norleen Roulette, MD [facility]   IR GENERIC HISTORICAL  12/01/2016   IR EMBO ARTERIAL NOT HEMORR HEMANG INC GUIDE ROADMAPPING 12/01/2016 Norleen Roulette, MD [facility]   IR GENERIC  HISTORICAL  12/01/2016   IR ANGIOGRAM SELECTIVE EACH ADDITIONAL VESSEL 12/01/2016 Norleen Roulette, MD [facility]   IR GENERIC HISTORICAL  12/01/2016   IR ANGIOGRAM SELECTIVE EACH ADDITIONAL VESSEL 12/01/2016 Norleen Roulette, MD [facility]   IR GENERIC HISTORICAL  12/01/2016   IR ANGIOGRAM PELVIS SELECTIVE OR SUPRASELECTIVE 12/01/2016 Norleen Roulette, MD [facility]   IR GENERIC HISTORICAL  01/07/2017   IR RADIOLOGIST EVAL & MGMT 01/07/2017 Norleen Roulette, MD [facility]   IR RADIOLOGIST EVAL & MGMT  08/31/2017   LOBECTOMY  2009   MYOMECTOMY  2007   UTERINE FIBROID EMBOLIZATION     12/01/2016    Current Medications: Current Meds  Medication Sig   ALPRAZolam (XANAX) 0.25 MG tablet Take 0.25 mg by mouth as needed for anxiety (taking if she feels her BP is high.).   carvedilol  (COREG ) 12.5 MG tablet Take 1 tablet (12.5 mg total) by mouth 2 (two) times daily.   Cholecalciferol (VITAMIN D3) 125 MCG (5000 UT) CAPS Take 1 capsule by mouth daily.   cloNIDine  (CATAPRES ) 0.1 MG tablet Take 0.1 mg by mouth 2 (two) times daily. (Patient taking differently: Take 0.05 mg by mouth 2 (two) times daily.)   loratadine (CLARITIN) 10 MG tablet Take 10 mg by mouth daily.   meclizine  (ANTIVERT ) 25 MG tablet Take 25 mg by mouth as needed for dizziness.   Multiple Vitamin (MULTIVITAMIN WITH MINERALS) TABS tablet Take 1 tablet by mouth daily.   valsartan  (DIOVAN ) 40 MG tablet Take 1 tablet (40 mg total) by mouth every evening. (Patient taking differently: Take 40 mg by mouth 2 (two) times daily.)     Allergies:   Bystolic  [nebivolol  hcl], Amitriptyline, Amlodipine , Biaxin [clarithromycin], Eplerenone , Hydralazine , Lisinopril , Peanut-containing drug products, Valsartan , Aspartame, and Erythromycin base   Social History   Socioeconomic History   Marital status: Divorced    Spouse name: Not on file   Number of children: 1   Years of education: Not on file   Highest education level: Master's degree (e.g., MA, MS,  MEng, MEd, MSW, MBA)  Occupational History    Comment: [employer]  Tobacco Use   Smoking status: Never   Smokeless tobacco: Never  Vaping Use   Vaping status: Never Used  Substance and Sexual Activity   Alcohol use: Not Currently    Alcohol/week: 1.0 standard drink of alcohol    Types: 1 Glasses of wine per week    Comment: quit years ago   Drug use: No   Sexual activity: Not on file  Other Topics Concern   Not on file  Social History Narrative   Lives alone   No caffiene   Right handed   Two story and steps inside   Social Drivers of Health   Financial Resource Strain: Low Risk  (02/24/2024)   Overall Financial Resource Strain (CARDIA)    Difficulty of Paying Living  Expenses: Not hard at all  Food Insecurity: No Food Insecurity (10/23/2024)   Hunger Vital Sign    Worried About Running Out of Food in the Last Year: Never true    Ran Out of Food in the Last Year: Never true  Transportation Needs: No Transportation Needs (10/23/2024)   PRAPARE - Administrator, Civil Service (Medical): No    Lack of Transportation (Non-Medical): No  Physical Activity: Insufficiently Active (02/24/2024)   Exercise Vital Sign    Days of Exercise per Week: 3 days    Minutes of Exercise per Session: 30 min  Stress: No Stress Concern Present (02/24/2024)   [employer] of Occupational Health - Occupational Stress Questionnaire    Feeling of Stress : Not at all  Social Connections: Moderately Isolated (02/24/2024)   Social Connection and Isolation Panel    Frequency of Communication with Friends and Family: Three times a week    Frequency of Social Gatherings with Friends and Family: Once a week    Attends Religious Services: More than 4 times per year    Active Member of [employer] or Organizations: No    Attends Engineer, Structural: Never    Marital Status: Divorced     Family History: The patient's family history includes Cancer in her brother; Cancer (age of onset: 21)  in her sister; Diabetes in her mother, sister, and sister; Esophageal cancer in her maternal grandfather; Glaucoma in her brother, maternal uncle, and sister; Heart attack in her sister; Heart disease in her maternal uncle; Hyperlipidemia in her sister; Hypertension in her brother, father, mother, sister, and sister; Kidney Stones in her mother; Mental illness in her father, sister, and sister; Obesity in her brother, mother, and sister; Pancreatic cancer in her cousin; Stroke in her brother, brother, and mother. There is no history of Stomach cancer or Colon cancer.  ROS:   Please see the history of present illness.    (+) Memory issues, brain fog (+) Daytime somnolence (+) Nocturia All other systems reviewed and are negative.  EKGs/Labs/Other Studies Reviewed:      EKG Interpretation Date/Time:    Ventricular Rate:    PR Interval:    QRS Duration:    QT Interval:    QTC Calculation:   R Axis:      Text Interpretation:          CT Cardiac Scoring  03/19/2023: IMPRESSION: Coronary calcium  score of 40.2. This was 88th percentile for age-, race-, and sex-matched controls.   Aorta: Borderline dilated at 38 mm (non-contrast), level of the main PA bifurcation.  MRI Abdomen  11/18/2022  [facility]): IMPRESSION:  1. Enlarged bilateral kidneys with innumerable cysts essentially replacing the renal parenchyma suggesting polycystic kidney disease. Suggestion of a horseshoe kidney.  2.  Multiple subcentimeter hepatic cysts.  3.  Gallbladder sludge.  4.  Uterine fibroids.  5.  Mild pelvic fluid.    Echo 05/26/2022:  IMPRESSIONS   1. Left ventricular ejection fraction, by estimation, is 60 to 65%. The  left ventricle has normal function. The left ventricle has no regional  wall motion abnormalities. There is mild concentric left ventricular  hypertrophy. Left ventricular diastolic  parameters are indeterminate, but likely with impaired relaxation.   2. Right ventricular systolic  function is normal. The right ventricular  size is normal.   3. Left atrial size was moderately dilated.   4. The mitral valve is normal in structure. Trivial mitral valve  regurgitation.   5.  The aortic valve is tricuspid. Aortic valve regurgitation is trivial.  No aortic stenosis is present.   6. The inferior vena cava is normal in size with greater than 50%  respiratory variability, suggesting right atrial pressure of 3 mmHg.    Monitor 02/2022 [facility]): 1.  Normal sinus rhythm predominates recording 2.  PACs and PAC burst are noted.  None of these are sustained 3.  Sinus tachycardia is noted during daytime activity, sinus bradycardia is noted during evening hours 4.  No significant arrhythmias are noted otherwise.    Exercise Myoview  07/11/2021: The left ventricular ejection fraction is normal (55-65%). Nuclear stress EF: 58%. There was no ST segment deviation noted during stress. No T wave inversion was noted during stress. The study is normal. This is a low risk study.   1.  Normal myocardial perfusion imaging study without evidence of ischemia or infarction. 2.  Normal LVEF, 58%. 3.  Excellent functional capacity (10:31 min:s; 12.5 METS).  4.  Normal heart rate and blood pressure response to exercise. 5.  This is a low risk study.   Echo 05/2021:  LVEF 60-65%  EKG:  EKG is personally reviewed. 09/29/2023:  Not ordered. 07/07/2023: Not ordered. 02/01/2023:  Sinus rhythm. Rate 61 bpm. 11/25/2022:  EKG was not ordered. 09/21/2022: EKG was not ordered. 05/13/2022: Sinus rhythm.  Rate 69 bpm  Recent Labs: 10/23/2024: ALT 16; BUN 17; Creatinine 1.12; Hemoglobin 10.9; Platelet Count 151; Potassium 3.7; Sodium 142; TSH 1.880   Recent Lipid Panel    Component Value Date/Time   CHOL 149 03/01/2024 0837   TRIG 69 03/01/2024 0837   HDL 42 03/01/2024 0837   CHOLHDL 3.5 03/01/2024 0837   CHOLHDL 4.1 01/05/2014 0823   VLDL 12 01/05/2014 0823   LDLCALC 93 03/01/2024 0837    LDLDIRECT 148.8 11/26/2008 1223    Physical Exam:    VS:  BP 132/86   Pulse 63   Ht 5' 5 (1.651 m)   Wt 177 lb 9.6 oz (80.6 kg)   SpO2 99%   BMI 29.55 kg/m  , BMI Body mass index is 29.55 kg/m. GENERAL:  Well appearing HEENT: Pupils equal round and reactive, fundi not visualized, oral mucosa unremarkable NECK:  No jugular venous distention, waveform within normal limits, carotid upstroke brisk and symmetric, no bruits, no thyromegaly LUNGS:  Clear to auscultation bilaterally HEART:  RRR.  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 Clonidine  inadequate for 24-hour control, causing dry mouth and dizziness. Amlodipine  not tolerated. Lisinopril  previously well-tolerated. Discussed renal denervation as FDA-approved option, potential 10-20 point BP reduction over 3-6 months. Insurance may be a barrier. - Discontinued clonidine . - Discontinued valsartan . - Initiated lisinopril  at higher dose. - Provided information on renal denervation. - Consider renal denervation if BP remains uncontrolled.  # Epistaxis likely secondary to uncontrolled hypertension and medication effects Increased epistaxis frequency likely due to elevated BP and  clonidine -induced dryness. - Address hypertension to potentially reduce epistaxis frequency.  # Chronic kidney disease, stage  3a Stage 3a CKD managed by nephrologist. Recent antihypertensive changes due to adverse effects. - Continue carvedilol  as prescribed.  # Atherosclerotic cardiovascular disease (coronary artery and aorta) Atherosclerotic disease in coronary artery and aorta.   Screening for Secondary Hypertension:     05/13/2022    9:50 AM  Causes  Drugs/Herbals Screened     - Comments plant based.  minimizes sodium.  no caffeine , etOH  Sleep Apnea Screened     - Comments check sleep study  Thyroid  Disease Screened    Relevant Labs/Studies:    Latest Ref Rng & Units 10/23/2024    3:20 PM 03/01/2024    8:37 AM 09/28/2022    1:23 PM  Basic Labs  Sodium 135 - 145 mmol/L 142  144  144   Potassium 3.5 - 5.1 mmol/L 3.7  4.4  4.7   Creatinine 0.44 - 1.00 mg/dL 8.87  8.87  8.84        Latest Ref Rng & Units 10/23/2024    3:20 PM 06/04/2021    2:57 AM  Thyroid    TSH 0.350 - 4.500 uIU/mL 1.880  2.543       Disposition:    FU with Advanced Hypertension Clinic in 3 months.  Medication Adjustments/Labs and Tests Ordered: Current medicines are reviewed at length with the patient today.  Concerns regarding medicines are outlined above.   No orders of the defined types were placed in this encounter.  No orders of the defined types were placed in this encounter.   Signed, Annabella Scarce, MD  11/01/2024 10:53 AM    [facility]

## 2024-11-01 NOTE — Telephone Encounter (Signed)
 Patient in for visit today  Requested going back on the Lisinopril  when seeing Dr Raford Lisinopril  listed as allergy nausea 10/2021  Patient is willing to try Lisinopril  and call if any issues  Discussed with Dr Raford who is agreeable to plan

## 2024-11-01 NOTE — Progress Notes (Signed)
 Established Patient Office Visit   Subjective  Patient ID: Heidi Herrera, female    DOB: [birth year]  Age: 58 y.o. MRN: [id number]  Chief Complaint  Patient presents with   New Patient (Initial Visit)    Pt is a 58 yo female seen for est care and f/u on chronic conditions.  Previously seen by several pcp's including [facility], Dr. Alec and [facility].  H/o Kidney dz.:  pt notes h/o polycystic kidney dz and CKD.  Followed by Nephrology, Dr. Wilfrid.  HTN:  seen by Cardiology, Dr. Raford.  On coreg  12.5 mg BID.  Lisinopril  20 mg added today.She was recently prescribed Xanax for anxiety related to blood pressure spikes.  H/o lobectomy in 2009 to remove a cyst from her right lung and has cysts on her lungs. A large cyst on her uterus was identified during an x-ray by a rheumatologist.  Looking for a new OB/GYN.  Several cyst throughout body including low back, left forearm.  Sees rheumatologist for osteoarthritis.  Endorses joint pain, particularly after eating certain foods. She is undergoing physical therapy twice a week for degenerative disc disease in her cervical spine and lower back, and exercises at the gym on weekends.  She has digestive issues, including bloating and food sensitivities, and follows a plant-based diet due to multiple food allergies, including seafood, almonds, peanuts, and gluten sensitivity. She experiences joint pain after consuming chicken and has a history of vertigo and ear drainage related to food intake.  She has a history of low white blood cell count and is under the care of a hematologist.   colonoscopy with [facility] in the past with polyp removal and is due for a repeat procedure.  She has a history of allergic reactions to medications, including rashes from Biaxin, lip and throat swelling from Vistarlic, and cognitive issues with amlodipine .  Surgical history: Right lung lobectomy 2009 due to cyst. Myomectomy 2006.  Embolization 7-8  years ago.  Social history: No tobacco, alcohol, or drug use. She works in programmer, multimedia.  Health maintenance: Mammogram 05/30/2024 Colonoscopy 11//2020 Bone density 05/25/2022.  Family medical history: Mom-HTN, DM sis-deceased, colon cancer Brother-deceased, aneurysm Brother-deceased--due to blood thing? sis-heart problems Older sis-60 yo, heart problems needs pacemaker     Patient Active Problem List   Diagnosis Date Noted   Leukopenia 10/23/2024   DDD (degenerative disc disease), cervical 03/27/2024   Palpitations 07/07/2023   Coronary artery calcification 07/07/2023   Pure hypercholesterolemia 07/07/2023   Allergy 07/28/2021   H. pylori infection 07/28/2021   Kidney failure 07/28/2021   Migraine 07/28/2021   Obesity 07/28/2021   Sebaceous cyst 07/28/2021   Sessile colonic polyp 07/28/2021   Abnormal weight loss 07/09/2021   Anal fissure 07/09/2021   Constipation 07/09/2021   Family history of malignant neoplasm of digestive organs 07/09/2021   Iron  deficiency anemia 07/09/2021   Nausea 07/09/2021   Rectal bleeding 07/09/2021   Atherosclerotic vascular disease 07/09/2021   Dyspnea on exertion 07/09/2021   Abdominal aortic atherosclerosis 07/09/2021   Premenstrual tension syndrome 04/24/2020   Polycystic kidney disease 11/29/2018   Benign paroxysmal positional vertigo due to bilateral vestibular disorder 09/26/2018   Body mass index (BMI) of 32.0-32.9 in adult 01/25/2018   Adjustment reaction with anxiety and depression 01/25/2018   Amenorrhea 06/28/2017   Mastodynia of left breast 06/28/2017   CKD (chronic kidney disease) stage 3, GFR 30-59 ml/min (HCC) 04/15/2017   Fibroid 12/01/2016   Anosmia 05/01/2016   Ear itch  05/01/2016   Fibroid, uterine    Essential hypertension 09/29/2014   Anemia in stage 3a chronic kidney disease (HCC) 02/02/2014   Hypertrophic scar 05/23/2012   Seborrheic dermatitis 05/23/2012   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 pylori infection    Hypertension    Kidney failure    stage 2   Migraine    Obesity    Palpitations 07/07/2023   Polycystic kidney disease    Pure hypercholesterolemia 07/07/2023   Sebaceous cyst    Sessile colonic polyp    Vertigo    Past Surgical History:  Procedure Laterality Date   IR GENERIC HISTORICAL  12/01/2016   IR ANGIOGRAM PELVIS SELECTIVE OR SUPRASELECTIVE 12/01/2016 Norleen Roulette, MD [facility]   IR GENERIC HISTORICAL  12/01/2016   IR US  GUIDE VASC ACCESS RIGHT 12/01/2016 Norleen Roulette, MD [facility]   IR GENERIC HISTORICAL  12/01/2016   IR EMBO ARTERIAL NOT HEMORR HEMANG INC GUIDE ROADMAPPING 12/01/2016 Norleen Roulette, MD [facility]   IR GENERIC HISTORICAL  12/01/2016   IR ANGIOGRAM SELECTIVE EACH ADDITIONAL VESSEL 12/01/2016 Norleen Roulette, MD [facility]   IR GENERIC HISTORICAL  12/01/2016   IR ANGIOGRAM SELECTIVE EACH ADDITIONAL VESSEL 12/01/2016 Norleen Roulette, MD [facility]   IR GENERIC HISTORICAL  12/01/2016   IR ANGIOGRAM PELVIS SELECTIVE OR SUPRASELECTIVE 12/01/2016 Norleen Roulette, MD [facility]   IR GENERIC HISTORICAL  01/07/2017   IR RADIOLOGIST EVAL & MGMT 01/07/2017 Norleen Roulette, MD [facility]   IR RADIOLOGIST EVAL & MGMT  08/31/2017   LOBECTOMY  2009   MYOMECTOMY  2007   UTERINE FIBROID EMBOLIZATION     12/01/2016   Social History   Tobacco Use   Smoking status: Never   Smokeless tobacco: Never  Vaping Use   Vaping status: Never Used  Substance Use Topics   Alcohol use: Not Currently    Alcohol/week: 1.0 standard drink of alcohol    Types: 1 Glasses of wine per  week    Comment: quit years ago   Drug use: No   Family History  Problem Relation Age of Onset   Hypertension Mother    Obesity Mother    Stroke Mother    Diabetes Mother    Kidney Stones Mother    Mental illness Father    Hypertension Father    Heart attack Sister    Hypertension Sister    Hyperlipidemia Sister    Obesity Sister    Diabetes Sister    Mental illness Sister    Glaucoma Sister    Hypertension Sister    Mental illness Sister    Cancer Sister 49   Diabetes Sister    Hypertension Brother    Obesity Brother    Stroke Brother    Glaucoma Brother    Cancer Brother    Stroke Brother    Heart disease Maternal Uncle    Glaucoma Maternal Uncle    Esophageal cancer Maternal Grandfather    Pancreatic cancer Cousin    Stomach cancer Neg Hx    Colon cancer Neg Hx  Allergies  Allergen Reactions   Bystolic  [Nebivolol  Hcl] Anaphylaxis, Itching and Swelling   Amitriptyline Swelling and Other (See Comments)    amitriptyline   Amlodipine      Feels poorly   Biaxin [Clarithromycin]     The category of the Biaxin family.  Also allergic to water chestnuts.   Eplerenone      Sick, skipped heart beats    Hydralazine      Palpitations    Lisinopril  Other (See Comments) and Nausea Only    lisinopril    Peanut-Containing Drug Products Itching   Valsartan  Other (See Comments)    Other Reaction(s): Swelling/blurred vision  Nausea   valsartan    Aspartame     Increased urination    Erythromycin Base Rash    ROS Negative unless stated above    Objective:     BP (!) 140/86 (BP Location: Left Arm, Patient Position: Sitting, Cuff Size: Large)   Pulse 66   Temp 98.6 F (37 C) (Oral)   Ht 5' 5 (1.651 m)   Wt 176 lb (79.8 kg)   LMP  (Exact Date)   SpO2 98%   BMI 29.29 kg/m  BP Readings from Last 3 Encounters:  11/01/24 (!) 140/86  11/01/24 132/86  10/23/24 (!) 158/82   Wt Readings from Last 3 Encounters:  11/01/24 176 lb (79.8 kg)  11/01/24 177 lb 9.6  oz (80.6 kg)  10/23/24 181 lb 14.4 oz (82.5 kg)      Physical Exam Constitutional:      General: She is not in acute distress.    Appearance: Normal appearance.  HENT:     Head: Normocephalic and atraumatic.     Nose: Nose normal.     Mouth/Throat:     Mouth: Mucous membranes are moist.  Eyes:     Comments: Arcus senilis.  Cardiovascular:     Rate and Rhythm: Normal rate and regular rhythm.     Heart sounds: Normal heart sounds. No murmur heard.    No gallop.  Pulmonary:     Effort: Pulmonary effort is normal. No respiratory distress.     Breath sounds: Normal breath sounds. No wheezing, rhonchi or rales.  Skin:    General: Skin is warm and dry.     Comments: Palpable cyst in left lower back and left posterior forearm  Neurological:     Mental Status: She is alert and oriented to person, place, and time.        11/01/2024    1:46 PM 11/01/2024    1:43 PM 10/23/2024    2:35 PM  Depression screen PHQ 2/9  Decreased Interest 0 0 0  Down, Depressed, Hopeless 0 0 0  PHQ - 2 Score 0 0 0  Altered sleeping 0 0   Tired, decreased energy 2 2   Change in appetite 0 0   Feeling bad or failure about yourself  0 0   Trouble concentrating 0 0   Moving slowly or fidgety/restless 0 0   Suicidal thoughts 0 0   PHQ-9 Score 2 2   Difficult doing work/chores Somewhat difficult Somewhat difficult       11/01/2024    1:45 PM 11/01/2024    1:44 PM 02/24/2024    2:21 PM 11/27/2022    2:03 PM  GAD 7 : Generalized Anxiety Score  Nervous, Anxious, on Edge 0 0 0 0  Control/stop worrying 0 0 0 0  Worry too much - different things 0 0 0 0  Trouble relaxing 0 0  1 0  Restless 0 0 0 0  Easily annoyed or irritable 0 0 0 0  Afraid - awful might happen 0 0 0 0  Total GAD 7 Score 0 0 1 0  Anxiety Difficulty Not difficult at all Not difficult at all Somewhat difficult      No results found for any visits on 11/01/24.    Assessment & Plan:   Polycystic kidney disease  Encounter to  establish care  Essential hypertension  Gluten intolerance  Multiple food allergies  Osteoarthritis involving multiple joints on both sides of body  DDD (degenerative disc disease), cervical  Degeneration of intervertebral disc of lumbar region with discogenic back pain  S/P lobectomy of lung  Skin cysts, generalized  Colon cancer screening -     Ambulatory referral to Gastroenterology   Continue f/u with Nephrology for PCKD.  Avoid nephrotoxic medications.  Renally dose medications.  BP elevated.  Recheck.  Discussed the importance of lifestyle modifications.  Lisinopril  just started this morning.  Continue to monitor at home and keep a log of readings to bring with you to clinic.  Multiple food allergies and history of gluten intolerance.  Avoidance.  OA of multiple joints and DDD.  Continue follow-up with rheumatology.  Ortho if needed.  History of lung lobectomy with numerous cyst in body.  Continue follow-up with pulmonology and rheumatology.  Currently stable.  Colon cancer screening referral placed.  Last colonoscopy 2020.  Followed by [facility].  Return in about 3 months (around 02/01/2025) for chronic conditions.   Clotilda JONELLE Single, MD

## 2024-11-01 NOTE — Patient Instructions (Addendum)
 Medication Instructions:  STOP VALSARTAN    STOP CLONIDINE    START LISINOPRIL  20 MG DAILY   Labwork: NONE  Testing/Procedures: NONE  Follow-Up: 3 MONTHS WITH DR Noorvik OR CAITLIN W NP   If you need a refill on your cardiac medications before your next appointment, please call your pharmacy.

## 2024-11-06 ENCOUNTER — Inpatient Hospital Stay: Admitting: Oncology

## 2024-11-06 ENCOUNTER — Encounter: Payer: Self-pay | Admitting: Oncology

## 2024-11-06 DIAGNOSIS — D631 Anemia in chronic kidney disease: Secondary | ICD-10-CM

## 2024-11-06 DIAGNOSIS — D72819 Decreased white blood cell count, unspecified: Secondary | ICD-10-CM | POA: Diagnosis not present

## 2024-11-06 DIAGNOSIS — N1831 Chronic kidney disease, stage 3a: Secondary | ICD-10-CM | POA: Diagnosis not present

## 2024-11-06 NOTE — Progress Notes (Signed)
 [facility]  HEMATOLOGY-ONCOLOGY ELECTRONIC VISIT PROGRESS NOTE  PATIENT NAME: Heidi Herrera   MR#: [id number] DOB: February 13, 1966  DATE OF SERVICE: 11/06/2024  Patient Care Team: Mercer Clotilda SAUNDERS, MD as PCP - General (Family Medicine) Raford Riggs, MD as PCP - Cardiology (Cardiology) Dolan Mateo Larger, MD as Consulting Physician (Nephrology) Georjean Darice HERO, MD as Consulting Physician (Neurology) Aryal, Govinda, MD (Rheumatology)  I connected with the patient via telephone conference and verified that I am speaking with the correct person using two identifiers. The patient's location is at home and I am providing care from the [facility].  I discussed the limitations, risks, security and privacy concerns of performing an evaluation and management service by e-visits and the availability of in person appointments. I also discussed with the patient that there may be a patient responsible charge related to this service. The patient expressed understanding and agreed to proceed.   ASSESSMENT & PLAN:   Heidi Herrera is a 58 y.o. lady with a past medical history of hypertension, iron  deficiency anemia, celiac disease, dyslipidemia, nephrolithiasis, horseshoe polycystic kidney, CKD stage 3a, was referred to our service in November 2025 for evaluation of chronic, intermittent leukopenia.    Leukopenia On 07/26/2024, labs at her nephrologist office showed white count of 2800 with ANC of 1300, normal differential.  Hemoglobin 11.4, platelet count 173,000.  She was referred to us  for further evaluation of leukopenia.  Previously labs in April 2024 showed white count of 2900, ANC of 1500, normal differential.  On review of available records, she has had chronic, intermittent leukopenia with white count in the range of 3100-5000, at least since 2009.  Chronic mild leukopenia with white blood cell count around 4,000, occasionally dropping to 2,800. Neutrophil count  remains safe at 1,300. No recurrent infections, fevers, chills, or night sweats. Differential diagnosis includes benign leukopenia, vitamin deficiencies, and bone marrow function issues. Recent rheumatology evaluation ruled out lupus and other autoimmune conditions. No new medications or significant dietary changes affecting white blood cell count. Possible benign leukopenia.  On her consultation with us  on 10/23/2024, labs showed white count of 2800 with ANC of 1200, normal differential.  Hemoglobin 10.9, hematocrit 33.6, MCV 87.7.  Platelet count normal at 151,000.  Creatinine 1.12, otherwise unremarkable CMP.  Ferritin normal at 35.  Vitamin B12, folic acid, methylmalonic acid, TSH, LDH normal.   Flow cytometry of peripheral blood was unremarkable.  SPEP showed no M spike.  IFE showed polyclonal increase.  Overall no evidence of monoclonal gammopathy.   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  Will continue close monitoring.  If progressive decline in blood counts is noted, we will proceed with bone marrow biopsy at that time  - Scheduled follow-up appointment in three months, with repeat labs.  Anemia in stage 3a chronic kidney disease (HCC) Chronic kidney disease stage 3a with fluctuating hemoglobin levels, likely due to decreased erythropoietin production. Hemoglobin levels around 11-12, slightly lower than normal range for women. No significant fatigue reported, which is more common with lower hemoglobin levels. - Continue monitoring hemoglobin levels in conjunction with kidney function.   Mildly elevated creatinine Creatinine level is slightly elevated at 1.12, with a normal range for women being 1.0. Kidney function has been fluctuating slightly, but no acute issues identified. - Continue to monitor kidney function.  I discussed the assessment and treatment plan with the patient. The patient was provided an opportunity to ask questions and all were answered. The patient agreed with the plan and  demonstrated an understanding of the instructions. The patient was advised to call back or seek an in-person evaluation if the symptoms worsen or if the condition fails to improve as anticipated.    I spent 12 minutes over the phone with the patient reviewing test results, discuss management and coordination/planning of care.  Chinita Patten, MD 11/06/2024 5:31 PM [facility] - A DEPT OF [facility] [address] [place] [address] Dept: [phone] Dept Fax: [phone]   INTERVAL HISTORY:  Please see above for problem oriented charting.  The purpose of today's discussion is to explain recent lab results and to formulate plan of care.  Discussed the use of AI scribe software for clinical note transcription with the patient, who gave verbal consent to proceed.  History of Present Illness Heidi Herrera is a 58 year old female who presents with persistently low white blood cell count.  She has experienced a persistently low white blood cell count since 2009, with fluctuations over time. Her most recent white blood cell count was 2,800, which is the lowest recorded, compared to previous counts of 3,000 and 2,900. Despite the low white count, her neutrophil count remains normal.  Her hemoglobin levels have been slightly low, with a recent measurement of 10.9, while her platelet count is normal. Iron , vitamin B12, folic acid , and thyroid  function tests are normal.  Kidney function has shown some fluctuation, with a creatinine level of 1.12, slightly above the normal range for women. Other tests, including iron , vitamin B12, folic acid , and thyroid  function, returned normal results. A flow cytometry test to assess bone marrow function was negative for leukemia or lymphoma.  She takes a liquid multivitamin, Mary Roots, and maintains a diet that includes greens and fruits.     SUMMARY OF HEMATOLOGY  HISTORY:   . She was referred by Dr. Dolan for evaluation of her low white blood cell count.   On 07/26/2024, labs at her nephrologist office showed white count of 2800 with ANC of 1300, normal differential.  Hemoglobin 11.4, platelet count 173,000.  She was referred to us  for further evaluation of leukopenia.   Previously labs in April 2024 showed white count of 2900, ANC of 1500, normal differential.   On review of available records, she has had chronic, intermittent leukopenia with white count in the range of 3100-5000, at least since 2009.   She has been experiencing a low white blood cell count, first noted by her nephrologist, with fluctuations in her white blood cell count, reaching as low as 2,800 in August. Historically, her white count has been around 4,000, with occasional drops to 3,100 and 3,700. No recurrent infections, fevers, chills, or night sweats. She experiences occasional sinus infections but nothing major. She has noticed some skin changes, describing 'little white patches' present for a couple of years, but denies significant itching apart from dry skin.   She has a history of osteoarthritis, diagnosed by a rheumatologist, and experiences neck and lower back pain. She has been tested for lupus and other autoimmune conditions, which were negative. There is a family history of rheumatoid arthritis.   Her current medications include valsartan , Coreg  (carvedilol ), and clonidine  for blood pressure management. She also takes ginger for digestive issues and follows a plant-based diet since 2019 due to kidney issues and digestive problems. She has a known gluten sensitivity.   She reports difficulty in controlling her blood pressure, with high readings noted even with  medication. She experiences fatigue, which she wonders might be related to her low blood count. Her hemoglobin levels have been between 11 and 12.   She denies fever, cough, diarrhea, or other infectious symptoms.   She denies epistaxis, bloody stool, melena, hematuria, bruising or other bleeding symptoms. She also denies unintentional weight loss, night sweats or other constitutional symptoms.  Chronic mild leukopenia with white blood cell count around 4,000, occasionally dropping to 2,800. Neutrophil count remains safe at 1,300. No recurrent infections, fevers, chills, or night sweats. Differential diagnosis includes benign leukopenia, vitamin deficiencies, and bone marrow function issues. Recent rheumatology evaluation ruled out lupus and other autoimmune conditions. No new medications or significant dietary changes affecting white blood cell count. Possible benign leukopenia.   On her consultation with us  on 10/23/2024, labs showed white count of 2800 with ANC of 1200, normal differential.  Hemoglobin 10.9, hematocrit 33.6, MCV 87.7.  Platelet count normal at 151,000.  Creatinine 1.12, otherwise unremarkable CMP.  Ferritin normal at 35.  Vitamin B12, folic acid, methylmalonic acid, TSH, LDH normal.   Flow cytometry of peripheral blood was unremarkable.  SPEP showed no M spike.  IFE showed polyclonal increase.  Overall no evidence of monoclonal gammopathy.   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  Will continue close monitoring.  If progressive decline in blood counts is noted, we will proceed with bone marrow biopsy at that time  REVIEW OF SYSTEMS:    Review of Systems - Oncology  All other pertinent systems were reviewed with the patient and are negative.  I have reviewed the past medical history, past surgical history, social history and family history with the patient and they are unchanged from previous note.  ALLERGIES:  She is allergic to bystolic  [nebivolol  hcl], amitriptyline, amlodipine , biaxin [clarithromycin], eplerenone , hydralazine , lisinopril , peanut-containing drug products, valsartan , aspartame, and erythromycin base.  MEDICATIONS:  Current Outpatient Medications  Medication Sig Dispense Refill    ALPRAZolam (XANAX) 0.25 MG tablet Take 0.25 mg by mouth as needed for anxiety (taking if she feels her BP is high.).     carvedilol  (COREG ) 12.5 MG tablet Take 1 tablet (12.5 mg total) by mouth 2 (two) times daily. 180 tablet 3   Cholecalciferol (VITAMIN D3) 125 MCG (5000 UT) CAPS Take 1 capsule by mouth daily.     cloNIDine  (CATAPRES ) 0.1 MG tablet Take 0.1 mg by mouth 2 (two) times daily. (Patient not taking: Reported on 11/01/2024)     lisinopril  (ZESTRIL ) 20 MG tablet Take 1 tablet (20 mg total) by mouth daily. 90 tablet 3   loratadine (CLARITIN) 10 MG tablet Take 10 mg by mouth daily.     meclizine  (ANTIVERT ) 25 MG tablet Take 25 mg by mouth as needed for dizziness.     Multiple Vitamin (MULTIVITAMIN WITH MINERALS) TABS tablet Take 1 tablet by mouth daily.     No current facility-administered medications for this visit.    PHYSICAL EXAMINATION:  Not performed today as it was a phone only visit  LABORATORY DATA:   I have reviewed the data as listed.  Recent Results (from the past 2160 hours)  Surgical pathology     Status: None   Collection Time: 10/23/24 12:00 AM  Result Value Ref Range   SURGICAL PATHOLOGY      Surgical Pathology CASE: [id number] PATIENT: Letonia Brillhart Flow Pathology Report     Clinical history: Leukopenia, unspecified type     DIAGNOSIS:  - No abnormal B or T-cell population identified   GATING AND PHENOTYPIC ANALYSIS:  Gated population: Flow cytometric immunophenotyping is performed using antibodies to the antigens listed in the table below. Electronic gates are placed around a cell cluster displaying light scatter properties corresponding to: lymphocytes  Abnormal Cells in gated population: N/A  Phenotype of Abnormal Cells: N/A                      Lymphoid Antigens       Myeloid Antigens Miscellaneous CD2  tested    CD10 tested    CD11b     ND   CD45 tested CD3  tested    CD19 tested    CD11c     ND   HLA-Dr    ND CD4  tested     CD20 tested    CD13 ND   CD34 tested CD5  tested    CD22 ND   CD14 ND   CD38 tested CD7  tested    CD79b     ND   CD15 ND   CD138     ND CD8  tested    CD103     ND   CD16 ND   TdT  ND CD25 ND   CD200     t ested    CD33 ND   CD123     ND TCRab     ND   sKappa    tested    CD64 ND   CD41 ND TCRgd     tested    sLambda   tested    CD117     ND   CD61 ND CD56 tested    cKappa    ND   MPO  ND   CD71 ND CD57 ND   cLambda   ND             CD235a    ND      GROSS DESCRIPTION:  One lavender tube submitted from [facility] for lymphoma testing.    Final Diagnosis performed by Ilsa Pottier, MD.   Electronically signed 10/24/2024 Technical and / or Professional components performed at [facility], [address] [place], [place] [address].  The above tests were developed and their performance characteristics determined by the [facility] system for the physical and immunophenotypic characterization of cell populations. They have not been cleared by the U.S. Food and Drug administration. The  FDA has determined that such clearance or approval is not necessary. This test is used for clinical purposes. It should not be  regarded as investigational or for res earch   Flow Cytometry, Peripheral Blood (Oncology)     Status: None   Collection Time: 10/23/24  3:20 PM  Result Value Ref Range   Flow Cytometry SEE SEPARATE REPORT     Comment: Performed at [facility], [address] [place], [place] [address]  TSH     Status: None   Collection Time: 10/23/24  3:20 PM  Result Value Ref Range   TSH 1.880 0.350 - 4.500 uIU/mL    Comment: Performed at [facility], [address] [place], [place] [address]  Folate     Status: None   Collection Time: 10/23/24  3:20 PM  Result Value Ref Range   Folate >20.0 >5.9 ng/mL    Comment: Performed at [facility], [address] [place], [place] [address]  Vitamin B12     Status: None    Collection Time: 10/23/24  3:20 PM  Result Value Ref Range  Vitamin B-12 482 180 - 914 pg/mL    Comment: (NOTE) This assay is not validated for testing neonatal or myeloproliferative syndrome specimens for Vitamin B12 levels. Performed at [facility], [address] [place], [place] [address]   Ferritin     Status: None   Collection Time: 10/23/24  3:20 PM  Result Value Ref Range   Ferritin 35 11 - 307 ng/mL    Comment: Performed at [facility], [address] [place], [place] [address]  Iron  and TIBC     Status: None   Collection Time: 10/23/24  3:20 PM  Result Value Ref Range   Iron  70 28 - 170 ug/dL   TIBC 688 749 - 549 ug/dL   Saturation Ratios 23 10.4 - 31.8 %   UIBC 241 ug/dL    Comment: Performed at [facility], [address] [place], [place] [address]  Lactate dehydrogenase     Status: None   Collection Time: 10/23/24  3:20 PM  Result Value Ref Range   LDH 166 98 - 192 U/L    Comment: Performed at [facility], [address], [place], [place] [address]  CMP (Cancer Center only)     Status: Abnormal   Collection Time: 10/23/24  3:20 PM  Result Value Ref Range   Sodium 142 135 - 145 mmol/L   Potassium 3.7 3.5 - 5.1 mmol/L   Chloride 107 98 - 111 mmol/L   CO2 26 22 - 32 mmol/L   Glucose, Bld 77 70 - 99 mg/dL    Comment: Glucose reference range applies only to samples taken after fasting for at least 8 hours.   BUN 17 6 - 20 mg/dL   Creatinine 8.87 (H) 9.55 - 1.00 mg/dL   Calcium  9.4 8.9 - 10.3 mg/dL   Total Protein 7.5 6.5 - 8.1 g/dL   Albumin 4.2 3.5 - 5.0 g/dL   AST 22 15 - 41 U/L   ALT 16 0 - 44 U/L   Alkaline Phosphatase 64 38 - 126 U/L   Total Bilirubin 0.5 0.0 - 1.2 mg/dL   GFR, Estimated 56 (L) >60 mL/min    Comment: (NOTE) Calculated using the CKD-EPI Creatinine Equation (2021)    Anion gap 9 5 - 15    Comment: Performed at [facility], [address] [place], [place]  [address]  CBC with Differential (Cancer Center Only)     Status: Abnormal   Collection Time: 10/23/24  3:20 PM  Result Value Ref Range   WBC Count 2.8 (L) 4.0 - 10.5 K/uL   RBC 3.83 (L) 3.87 - 5.11 MIL/uL   Hemoglobin 10.9 (L) 12.0 - 15.0 g/dL   HCT 66.3 (L) 63.9 - 53.9 %   MCV 87.7 80.0 - 100.0 fL   MCH 28.5 26.0 - 34.0 pg   MCHC 32.4 30.0 - 36.0 g/dL   RDW 86.7 88.4 - 84.4 %   Platelet Count 151 150 - 400 K/uL    Comment: REPEATED TO VERIFY   nRBC 0.0 0.0 - 0.2 %   Neutrophils Relative % 44 %   Neutro Abs 1.2 (L) 1.7 - 7.7 K/uL   Lymphocytes Relative 44 %   Lymphs Abs 1.2 0.7 - 4.0 K/uL   Monocytes Relative 7 %   Monocytes Absolute 0.2 0.1 - 1.0 K/uL   Eosinophils Relative 4 %   Eosinophils Absolute 0.1 0.0 - 0.5 K/uL   Basophils Relative 1 %   Basophils Absolute  0.0 0.0 - 0.1 K/uL   Immature Granulocytes 0 %   Abs Immature Granulocytes 0.01 0.00 - 0.07 K/uL    Comment: Performed at [facility], [address] [place], [place] [address]  Methylmalonic acid, serum     Status: None   Collection Time: 10/23/24  3:20 PM  Result Value Ref Range   Methylmalonic Acid, Quantitative 149 0 - 378 nmol/L    Comment: (NOTE) This test was developed and its performance characteristics determined by [facility]. It has not been cleared or approved by the Food and Drug Administration. Performed At: [facility] [address] [place], [place] [id number] Jennette Shorter MD Ey:[id number]   Kappa/lambda light chains     Status: Abnormal   Collection Time: 10/23/24  3:20 PM  Result Value Ref Range   Kappa free light chain 49.9 (H) 3.3 - 19.4 mg/L   Lambda free light chains 22.4 5.7 - 26.3 mg/L   Kappa, lambda light chain ratio 2.23 (H) 0.26 - 1.65    Comment: (NOTE) Performed At: [facility] [address] [place], [place] [id number] Jennette Shorter MD Ey:[id number]   Multiple Myeloma Panel (SPEP&IFE w/QIG)     Status: Abnormal   Collection Time:  10/23/24  3:20 PM  Result Value Ref Range   IgG (Immunoglobin G), Serum 1,966 (H) 586 - 1,602 mg/dL   IgA 779 87 - 647 mg/dL   IgM (Immunoglobulin M), Srm 50 26 - 217 mg/dL   Total Protein ELP 7.1 6.0 - 8.5 g/dL   Albumin SerPl Elph-Mcnc 3.6 2.9 - 4.4 g/dL   Alpha 1 0.2 0.0 - 0.4 g/dL   Alpha2 Glob SerPl Elph-Mcnc 0.6 0.4 - 1.0 g/dL   B-Globulin SerPl Elph-Mcnc 1.0 0.7 - 1.3 g/dL   Gamma Glob SerPl Elph-Mcnc 1.7 0.4 - 1.8 g/dL   M Protein SerPl Elph-Mcnc Not Observed Not Observed g/dL   Globulin, Total 3.5 2.2 - 3.9 g/dL   Albumin/Glob SerPl 1.1 0.7 - 1.7   IFE 1 Comment (A)     Comment: Polyclonal increase detected in one or more immunoglobulins.   Please Note Comment     Comment: (NOTE) Protein electrophoresis scan will follow via computer, mail, or courier delivery. Performed At: [facility] [address] [place], [place] [id number] Jennette Shorter MD Ey:[id number]      RADIOGRAPHIC STUDIES:  No recent pertinent imaging studies available to review.  Orders Placed This Encounter  Procedures   CBC with Differential (Cancer Center Only)    Standing Status:   Future    Expected Date:   01/22/2025    Expiration Date:   04/22/2025   CMP (Cancer Center only)    Standing Status:   Future    Expected Date:   01/22/2025    Expiration Date:   04/22/2025   Lactate dehydrogenase    Standing Status:   Future    Expected Date:   01/22/2025    Expiration Date:   04/22/2025   Haptoglobin    Standing Status:   Future    Expected Date:   01/22/2025    Expiration Date:   04/22/2025   ANA w/Reflex if Positive    Standing Status:   Future    Expected Date:   01/22/2025    Expiration Date:   04/22/2025   Direct antiglobulin test (not at [facility])    Standing Status:   Future    Expected Date:   01/22/2025    Expiration Date:   04/22/2025  Future Appointments  Date Time Provider Department Center  01/22/2025 11:30 AM [facility] PHLEBOTOMIST [facility] None  01/22/2025 11:45 AM Kathlynn Swofford,  Chinita, MD [facility] None  02/02/2025  1:30 PM Raford Riggs, MD [facility] [address]  02/07/2025  1:00 PM Mercer Clotilda SAUNDERS, MD [facility] [address]    This document was completed utilizing speech recognition software. Grammatical errors, random word insertions, pronoun errors, and incomplete sentences are an occasional consequence of this system due to software limitations, ambient noise, and hardware issues. Any formal questions or concerns about the content, text or information contained within the body of this dictation should be directly addressed to the provider for clarification.

## 2024-11-06 NOTE — Assessment & Plan Note (Signed)
 Chronic kidney disease stage 3a with fluctuating hemoglobin levels, likely due to decreased erythropoietin production. Hemoglobin levels around 11-12, slightly lower than normal range for women. No significant fatigue reported, which is more common with lower hemoglobin levels. - Continue monitoring hemoglobin levels in conjunction with kidney function.

## 2024-11-06 NOTE — Assessment & Plan Note (Signed)
 On 07/26/2024, labs at her nephrologist office showed white count of 2800 with ANC of 1300, normal differential.  Hemoglobin 11.4, platelet count 173,000.  She was referred to us  for further evaluation of leukopenia.  Previously labs in April 2024 showed white count of 2900, ANC of 1500, normal differential.  On review of available records, she has had chronic, intermittent leukopenia with white count in the range of 3100-5000, at least since 2009.  Chronic mild leukopenia with white blood cell count around 4,000, occasionally dropping to 2,800. Neutrophil count remains safe at 1,300. No recurrent infections, fevers, chills, or night sweats. Differential diagnosis includes benign leukopenia, vitamin deficiencies, and bone marrow function issues. Recent rheumatology evaluation ruled out lupus and other autoimmune conditions. No new medications or significant dietary changes affecting white blood cell count. Possible benign leukopenia.  On her consultation with us  on 10/23/2024, labs showed white count of 2800 with ANC of 1200, normal differential.  Hemoglobin 10.9, hematocrit 33.6, MCV 87.7.  Platelet count normal at 151,000.  Creatinine 1.12, otherwise unremarkable CMP.  Ferritin normal at 35.  Vitamin B12, folic acid , methylmalonic acid, TSH, LDH normal.   Flow cytometry of peripheral blood was unremarkable.  SPEP showed no M spike.  IFE showed polyclonal increase.  Overall no evidence of monoclonal gammopathy.   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  Will continue close monitoring.  If progressive decline in blood counts is noted, we will proceed with bone marrow biopsy at that time  - Scheduled follow-up appointment in three months, with repeat labs.

## 2024-11-29 ENCOUNTER — Encounter (HOSPITAL_BASED_OUTPATIENT_CLINIC_OR_DEPARTMENT_OTHER): Admitting: Cardiovascular Disease

## 2024-11-30 ENCOUNTER — Encounter (INDEPENDENT_AMBULATORY_CARE_PROVIDER_SITE_OTHER): Payer: Self-pay

## 2024-12-19 ENCOUNTER — Telehealth: Payer: Self-pay | Admitting: Cardiovascular Disease

## 2024-12-19 NOTE — Telephone Encounter (Signed)
 Spoke with patient and she feels that the Carvedilol  is causing her to be dehydrated Feels her mouth and skin are dry, voice hoarse  Discussed the fact she had been on the medication for over 2 years, likely not contributing  Also complaining of feeling flushing sensation  SBP running in upper 130's in morning  Will forward to Dr Raford and Pharm D  for review

## 2024-12-19 NOTE — Telephone Encounter (Signed)
 Pt c/o medication issue:  1. Name of Medication: carvedilol  (COREG ) 12.5 MG tablet   2. How are you currently taking this medication (dosage and times per day)?    3. Are you having a reaction (difficulty breathing--STAT)? no  4. What is your medication issue? Patient believed medication is causing her not to stay hydrated. Her lips, eyes and skin stay dry all the time. Please advise

## 2024-12-21 NOTE — Telephone Encounter (Signed)
 Doubt the carvedilol  is cause of those.  It's been a dry winter, thus more problems with dry skin overall.  Carvedilol  not known to cause symptoms like she's describing after 2 years of use.  As for the flushing, if it's a whole body sensation, maybe talk to her GYN.  With her history of medication intolerances, would not be easy to find another medication that is tolerated.

## 2024-12-26 NOTE — Telephone Encounter (Signed)
 Mychart message sent to patient.

## 2024-12-28 NOTE — Telephone Encounter (Signed)
 Dr Raford reviewed and agreed with Kristin A Pharm D

## 2024-12-29 NOTE — Telephone Encounter (Signed)
 Patient never viewed in mychart, left detailed message ok per [facility]

## 2025-01-19 ENCOUNTER — Institutional Professional Consult (permissible substitution) (INDEPENDENT_AMBULATORY_CARE_PROVIDER_SITE_OTHER)

## 2025-01-22 ENCOUNTER — Inpatient Hospital Stay: Admitting: Oncology

## 2025-01-22 ENCOUNTER — Inpatient Hospital Stay

## 2025-02-02 ENCOUNTER — Encounter (HOSPITAL_BASED_OUTPATIENT_CLINIC_OR_DEPARTMENT_OTHER): Admitting: Cardiovascular Disease

## 2025-02-06 ENCOUNTER — Ambulatory Visit: Admitting: Family Medicine

## 2025-02-07 ENCOUNTER — Ambulatory Visit: Admitting: Family Medicine

## 2025-02-14 ENCOUNTER — Institutional Professional Consult (permissible substitution) (INDEPENDENT_AMBULATORY_CARE_PROVIDER_SITE_OTHER)

## 2025-03-28 ENCOUNTER — Encounter (HOSPITAL_BASED_OUTPATIENT_CLINIC_OR_DEPARTMENT_OTHER): Admitting: Cardiovascular Disease

## 2025-04-17 ENCOUNTER — Inpatient Hospital Stay

## 2025-04-17 ENCOUNTER — Inpatient Hospital Stay: Admitting: Oncology
